# Patient Record
Sex: Female | Born: 1965 | Race: Black or African American | Hispanic: No | Marital: Single | Smoking: Current every day smoker
Health system: Southern US, Community
[De-identification: ages and names within clinical notes are randomized; demographics above are authoritative.]

## PROBLEM LIST (undated history)

## (undated) ENCOUNTER — Emergency Department (HOSPITAL_COMMUNITY): Admission: EM | Discharge status: Absent without leave | Payer: Medicare Other

## (undated) DIAGNOSIS — R9431 Abnormal electrocardiogram [ECG] [EKG]: Secondary | ICD-10-CM

## (undated) DIAGNOSIS — Z720 Tobacco use: Secondary | ICD-10-CM

## (undated) DIAGNOSIS — I214 Non-ST elevation (NSTEMI) myocardial infarction: Secondary | ICD-10-CM

## (undated) DIAGNOSIS — Z9981 Dependence on supplemental oxygen: Secondary | ICD-10-CM

## (undated) DIAGNOSIS — I2510 Atherosclerotic heart disease of native coronary artery without angina pectoris: Secondary | ICD-10-CM

## (undated) DIAGNOSIS — R0609 Other forms of dyspnea: Secondary | ICD-10-CM

## (undated) DIAGNOSIS — R0989 Other specified symptoms and signs involving the circulatory and respiratory systems: Secondary | ICD-10-CM

## (undated) DIAGNOSIS — J449 Chronic obstructive pulmonary disease, unspecified: Secondary | ICD-10-CM

## (undated) DIAGNOSIS — I639 Cerebral infarction, unspecified: Secondary | ICD-10-CM

## (undated) DIAGNOSIS — E785 Hyperlipidemia, unspecified: Secondary | ICD-10-CM

## (undated) DIAGNOSIS — I10 Essential (primary) hypertension: Secondary | ICD-10-CM

## (undated) DIAGNOSIS — N289 Disorder of kidney and ureter, unspecified: Secondary | ICD-10-CM

## (undated) HISTORY — DX: Abnormal electrocardiogram (ECG) (EKG): R94.31

## (undated) HISTORY — DX: Chronic obstructive pulmonary disease, unspecified: J44.9

## (undated) HISTORY — DX: Essential (primary) hypertension: I10

## (undated) HISTORY — DX: Cerebral infarction, unspecified: I63.9

## (undated) HISTORY — DX: Other specified symptoms and signs involving the circulatory and respiratory systems: R09.89

## (undated) HISTORY — DX: Non-ST elevation (NSTEMI) myocardial infarction: I21.4

## (undated) HISTORY — DX: Atherosclerotic heart disease of native coronary artery without angina pectoris: I25.10

## (undated) HISTORY — DX: Hyperlipidemia, unspecified: E78.5

## (undated) HISTORY — DX: Other forms of dyspnea: R06.09

## (undated) HISTORY — PX: CORONARY ANGIOPLASTY: SHX604

## (undated) HISTORY — DX: Tobacco use: Z72.0

---

## 2002-03-29 ENCOUNTER — Emergency Department (HOSPITAL_COMMUNITY): Admission: EM | Admit: 2002-03-29 | Discharge: 2002-03-29 | Payer: Self-pay | Admitting: Emergency Medicine

## 2002-07-24 ENCOUNTER — Ambulatory Visit (HOSPITAL_BASED_OUTPATIENT_CLINIC_OR_DEPARTMENT_OTHER): Admission: RE | Admit: 2002-07-24 | Discharge: 2002-07-24 | Payer: Self-pay | Admitting: Internal Medicine

## 2005-02-09 ENCOUNTER — Emergency Department (HOSPITAL_COMMUNITY): Admission: EM | Admit: 2005-02-09 | Discharge: 2005-02-09 | Payer: Self-pay | Admitting: Emergency Medicine

## 2005-02-25 ENCOUNTER — Emergency Department (HOSPITAL_COMMUNITY): Admission: EM | Admit: 2005-02-25 | Discharge: 2005-02-25 | Payer: Self-pay | Admitting: Family Medicine

## 2005-06-21 ENCOUNTER — Emergency Department (HOSPITAL_COMMUNITY): Admission: EM | Admit: 2005-06-21 | Discharge: 2005-06-21 | Payer: Self-pay | Admitting: Emergency Medicine

## 2005-07-01 ENCOUNTER — Emergency Department (HOSPITAL_COMMUNITY): Admission: EM | Admit: 2005-07-01 | Discharge: 2005-07-01 | Payer: Self-pay | Admitting: Family Medicine

## 2005-07-10 ENCOUNTER — Emergency Department (HOSPITAL_COMMUNITY): Admission: EM | Admit: 2005-07-10 | Discharge: 2005-07-10 | Payer: Self-pay | Admitting: Emergency Medicine

## 2005-07-29 ENCOUNTER — Emergency Department (HOSPITAL_COMMUNITY): Admission: EM | Admit: 2005-07-29 | Discharge: 2005-07-29 | Payer: Self-pay | Admitting: Emergency Medicine

## 2005-11-02 ENCOUNTER — Emergency Department (HOSPITAL_COMMUNITY): Admission: EM | Admit: 2005-11-02 | Discharge: 2005-11-02 | Payer: Self-pay | Admitting: Family Medicine

## 2005-11-23 ENCOUNTER — Emergency Department (HOSPITAL_COMMUNITY): Admission: EM | Admit: 2005-11-23 | Discharge: 2005-11-23 | Payer: Self-pay | Admitting: Family Medicine

## 2005-12-21 ENCOUNTER — Emergency Department (HOSPITAL_COMMUNITY): Admission: EM | Admit: 2005-12-21 | Discharge: 2005-12-21 | Payer: Self-pay | Admitting: Emergency Medicine

## 2006-03-13 ENCOUNTER — Emergency Department (HOSPITAL_COMMUNITY): Admission: EM | Admit: 2006-03-13 | Discharge: 2006-03-13 | Payer: Self-pay | Admitting: Emergency Medicine

## 2006-05-15 ENCOUNTER — Emergency Department (HOSPITAL_COMMUNITY): Admission: EM | Admit: 2006-05-15 | Discharge: 2006-05-15 | Payer: Self-pay | Admitting: Family Medicine

## 2006-06-24 ENCOUNTER — Ambulatory Visit (HOSPITAL_COMMUNITY): Admission: RE | Admit: 2006-06-24 | Discharge: 2006-06-24 | Payer: Self-pay | Admitting: Obstetrics

## 2006-09-30 ENCOUNTER — Emergency Department (HOSPITAL_COMMUNITY): Admission: EM | Admit: 2006-09-30 | Discharge: 2006-09-30 | Payer: Self-pay | Admitting: Family Medicine

## 2006-10-17 ENCOUNTER — Emergency Department (HOSPITAL_COMMUNITY): Admission: EM | Admit: 2006-10-17 | Discharge: 2006-10-17 | Payer: Self-pay | Admitting: Emergency Medicine

## 2006-12-27 ENCOUNTER — Emergency Department (HOSPITAL_COMMUNITY): Admission: EM | Admit: 2006-12-27 | Discharge: 2006-12-27 | Payer: Self-pay | Admitting: Emergency Medicine

## 2007-01-06 ENCOUNTER — Ambulatory Visit: Payer: Self-pay | Admitting: Pulmonary Disease

## 2007-01-16 ENCOUNTER — Ambulatory Visit (HOSPITAL_COMMUNITY): Admission: RE | Admit: 2007-01-16 | Discharge: 2007-01-16 | Payer: Self-pay | Admitting: Pulmonary Disease

## 2007-01-18 ENCOUNTER — Encounter: Payer: Self-pay | Admitting: Pulmonary Disease

## 2007-03-08 DIAGNOSIS — R0989 Other specified symptoms and signs involving the circulatory and respiratory systems: Secondary | ICD-10-CM

## 2007-03-08 DIAGNOSIS — I10 Essential (primary) hypertension: Secondary | ICD-10-CM

## 2007-03-08 DIAGNOSIS — R0609 Other forms of dyspnea: Secondary | ICD-10-CM

## 2007-03-08 HISTORY — DX: Other specified symptoms and signs involving the circulatory and respiratory systems: R06.09

## 2007-03-08 HISTORY — DX: Other specified symptoms and signs involving the circulatory and respiratory systems: R09.89

## 2008-02-22 ENCOUNTER — Emergency Department (HOSPITAL_COMMUNITY): Admission: EM | Admit: 2008-02-22 | Discharge: 2008-02-22 | Payer: Self-pay | Admitting: Emergency Medicine

## 2008-05-21 ENCOUNTER — Encounter: Payer: Self-pay | Admitting: Pulmonary Disease

## 2008-05-31 ENCOUNTER — Telehealth (INDEPENDENT_AMBULATORY_CARE_PROVIDER_SITE_OTHER): Payer: Self-pay | Admitting: *Deleted

## 2008-06-10 ENCOUNTER — Encounter: Payer: Self-pay | Admitting: Pulmonary Disease

## 2009-07-07 ENCOUNTER — Emergency Department (HOSPITAL_COMMUNITY)
Admission: EM | Admit: 2009-07-07 | Discharge: 2009-07-07 | Payer: Self-pay | Source: Home / Self Care | Admitting: Emergency Medicine

## 2009-11-15 ENCOUNTER — Emergency Department (HOSPITAL_COMMUNITY): Admission: EM | Admit: 2009-11-15 | Discharge: 2009-11-15 | Payer: Self-pay | Admitting: Emergency Medicine

## 2009-12-30 ENCOUNTER — Encounter: Payer: Self-pay | Admitting: Cardiovascular Disease

## 2010-01-28 ENCOUNTER — Encounter: Payer: Self-pay | Admitting: Cardiovascular Disease

## 2010-02-05 ENCOUNTER — Ambulatory Visit (HOSPITAL_COMMUNITY)
Admission: RE | Admit: 2010-02-05 | Discharge: 2010-02-05 | Payer: Self-pay | Source: Home / Self Care | Attending: Orthopaedic Surgery | Admitting: Orthopaedic Surgery

## 2010-02-05 ENCOUNTER — Encounter: Payer: Self-pay | Admitting: Cardiovascular Disease

## 2010-02-12 ENCOUNTER — Encounter: Payer: Self-pay | Admitting: Cardiovascular Disease

## 2010-02-12 DIAGNOSIS — J4489 Other specified chronic obstructive pulmonary disease: Secondary | ICD-10-CM | POA: Insufficient documentation

## 2010-02-12 DIAGNOSIS — Q359 Cleft palate, unspecified: Secondary | ICD-10-CM | POA: Insufficient documentation

## 2010-02-12 DIAGNOSIS — J45909 Unspecified asthma, uncomplicated: Secondary | ICD-10-CM | POA: Insufficient documentation

## 2010-02-12 DIAGNOSIS — J449 Chronic obstructive pulmonary disease, unspecified: Secondary | ICD-10-CM | POA: Insufficient documentation

## 2010-02-12 DIAGNOSIS — J40 Bronchitis, not specified as acute or chronic: Secondary | ICD-10-CM | POA: Insufficient documentation

## 2010-02-13 ENCOUNTER — Ambulatory Visit
Admission: RE | Admit: 2010-02-13 | Discharge: 2010-02-13 | Payer: Self-pay | Source: Home / Self Care | Attending: Cardiovascular Disease | Admitting: Cardiovascular Disease

## 2010-02-13 DIAGNOSIS — R9431 Abnormal electrocardiogram [ECG] [EKG]: Secondary | ICD-10-CM | POA: Insufficient documentation

## 2010-02-13 HISTORY — DX: Abnormal electrocardiogram (ECG) (EKG): R94.31

## 2010-02-24 ENCOUNTER — Ambulatory Visit: Admit: 2010-02-24 | Payer: Self-pay

## 2010-03-01 ENCOUNTER — Encounter: Payer: Self-pay | Admitting: Obstetrics

## 2010-03-12 NOTE — Letter (Signed)
Summary: [facility] - Cardiac Clearance  [facility] - Cardiac Clearance   Imported By: Marilynne Drivers 02/13/2010 08:51:13  _____________________________________________________________________  External Attachment:    Type:   Image     Comment:   External Document

## 2010-03-12 NOTE — Letter (Signed)
Summary: Warehouse manager - Office Note  [facility] - Office Note   Imported By: Marilynne Drivers 02/13/2010 08:52:49  _____________________________________________________________________  External Attachment:    Type:   Image     Comment:   External Document

## 2010-03-12 NOTE — Assessment & Plan Note (Signed)
Summary: abnormal ekg/pt needs left ulner nerve decomperssion of the e...   History of Present Illness: Karla Nelson is seen today at the request of Dr Rush Farmer for preop clearance.  She needs left elbow ulnar nerve decompression.  Preop evaluation showed abnormal ECG.  I reviewed this and she had marked anterolateral T wave changes.  No old ECG available as she moved from [place].  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  Not exertional sometimes focal to left neck and head.  Smokes a ppd with some exertional dyspnea.  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  Has not had previous echo or ETT.  CRF also include HTN and has chronic dyspnea from COPD.    Given CRF's atypical SSCP, dyspnea and smoking with abnormal ECG she will need a stress myovue and echo to clear her for surgery.  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 Inhale 2 Puffs Every 4 Hours As Needed 2)  Symbicort 160-4.5 Mcg/act  Aero (Budesonide-Formoterol Fumarate) .... Inhale 2 Puffs Two Times A Day 3)  Methadone .... (Unknown Dosage) 4)  Lisinopril 40 Mg Tabs (Lisinopril) .... Take One Tablet By Mouth Daily 5)  Ibuprofen 800 Mg Tabs (Ibuprofen) .... As Needed 6)  Metoprolol Tartrate 50 Mg Tabs (Metoprolol Tartrate) .... Take One Tablet By Mouth Twice A Day 7)  Singulair 10 Mg Tabs (Montelukast Sodium) .Marland Kitchen.. 1 Tab By Mouth Once Daily 8)  Oxygen .... Daily 9)  Nebulizer Machine .... As Directed 10)  Nasal  Spray .... As Needed  Allergies (verified): No Known Drug Allergies  Past History:  Past Medical History: Last updated: 02/12/2010 HYPERTENSION CLEFT PALATE  ASTHMA: sensitive to BB;s when used to Rx HTN COPD  BRONCHITIS DYSPNEA ON EXERTION   Past Surgical History: Last updated: 02/12/2010  caesarean section   Family History: Last updated: 02/12/2010 diabetes, hypertension  Social History: Last updated: 02/12/2010  current smoker w/i last 12 mos., former drug abuse   Review of Systems       Denies fever, malais, weight loss, blurry vision, decreased visual acuity, cough, sputum, hemoptysis, pleuritic pain, palpitaitons, heartburn, abdominal pain, melena, lower extremity edema, claudication, or rash.   Vital Signs:  Patient profile:   45 year old female Height:      65 inches Weight:      177 pounds BMI:     29.56 Pulse rate:   67 / minute Resp:     14 per minute BP sitting:   171 / 108  (left arm)  Vitals Entered By: Burnett Kanaris (February 13, 2010 11:10 AM)  Physical Exam  General:  Affect appropriate Healthy:  appears stated age 34: normal Neck supple with no adenopathy JVP normal no bruits no thyromegaly Lungs clear with no wheezing and good diaphragmatic motion Heart:  S1/S2 no murmur,rub, gallop or click PMI normal Abdomen: benighn, BS positve, no tenderness, no AAA no bruit.  No HSM or HJR Distal pulses intact with no bruits No edema Neuro non-focal Skin warm and dry    Impression & Recommendations:  Problem # 1:  HYPERTENSION (ICD-401.9) Continue current meds.  Monitor BB for wheezing with history of asthma Her updated medication list for this problem includes:    Lisinopril 40 Mg Tabs (Lisinopril) .Marland Kitchen... Take  one tablet by mouth daily    Metoprolol Tartrate 50 Mg Tabs (Metoprolol tartrate) .Marland Kitchen... Take one tablet by mouth twice a day  Problem # 2:  ELECTROCARDIOGRAM, ABNORMAL (ICD-794.31) Echo to R/O HOCM  stress myovue to R/O ischemia Her updated medication list for this problem includes:    Lisinopril 40 Mg Tabs (Lisinopril) .Marland Kitchen... Take one tablet by mouth daily    Metoprolol Tartrate 50 Mg Tabs (Metoprolol tartrate) .Marland Kitchen... Take one tablet by mouth twice a day  Problem # 3:  PREOPERATIVE EXAMINATION (ICD-V72.84) Will clear for  surgery pending myovue and echo  COPD not prohibitive.  Counseled her for less than 10 minuts on cessation.  With methadone Rx best approach would be nicotine patch fi tests are normal.    Other Orders: Echocardiogram (Echo) Nuclear Stress Test (Nuc Stress Test)  Patient Instructions: 1)  Your physician has requested that you have an echocardiogram.  Echocardiography is a painless test that uses sound waves to create images of your heart. It provides your doctor with information about the size and shape of your heart and how well your heart's chambers and valves are working.  This procedure takes approximately one hour. There are no restrictions for this procedure. 2)  Your physician has requested that you have an exercise stress myoview.  For further information please visit [email or web address].  Please follow instruction sheet, as given.   Echocardiogram Report  Procedure date:  02/05/2010  Findings:      NSR 66 Diffuse lateral T wave inversions

## 2010-03-12 NOTE — Letter (Signed)
Summary: Warehouse manager - Office Note  [facility] - Office Note   Imported By: Marilynne Drivers 02/13/2010 08:52:09  _____________________________________________________________________  External Attachment:    Type:   Image     Comment:   External Document

## 2010-04-14 ENCOUNTER — Telehealth: Payer: Self-pay | Admitting: Cardiovascular Disease

## 2010-04-20 LAB — PREGNANCY, URINE: Preg Test, Ur: NEGATIVE

## 2010-04-20 LAB — BASIC METABOLIC PANEL
Calcium: 9.5 mg/dL (ref 8.4–10.5)
Chloride: 104 mEq/L (ref 96–112)
Creatinine, Ser: 1 mg/dL (ref 0.4–1.2)
GFR calc Af Amer: >60 mL/min (ref >60)
GFR calc non Af Amer: 60 mL/min — ABNORMAL LOW (ref >60)

## 2010-04-20 LAB — PROTIME-INR
INR: 1.01 (ref 0.00–1.49)
Prothrombin Time: 13.5 seconds (ref 11.6–15.2)

## 2010-04-28 NOTE — Progress Notes (Addendum)
Summary: sur clearance for elbow surgery** Second Request*  Phone Note From Other Clinic   Caller: Nurse sherri dr. Louanne Skye office # [phone] Summary of Call: pt needs sur. clearance for left elbow surgery.  Initial call taken by: Regan Lemming,  April 14, 2010 3:52 PM  Follow-up for Phone Call        Pt. needs surgical clearance for left elbow surgery. Will forward to Dr. Johnsie Cancel to review. Whitney Jannett Celestine RN  April 14, 2010 4:21 PM  Attempted to call pt. and LVM to schedule echo/stress myoview for surgical clearance but VM was full. Patient was scheduled for these procedures in Jan, 2012 but they were cancelled. Will attempt to call her back later. Whitney Jannett Celestine RN  April 16, 2010 8:23 AM  Follow-up by: Whitney Jannett Celestine RN,  April 14, 2010 4:21 PM  Additional Follow-up for Phone Call Additional follow up Details #1::        She was supposed to have echo and myovue to clear her for surgery.  Dont see that these have been done  Please read my last note before sending these phone notes.   Additional Follow-up by: Bosie Clos, MD, [facility],  April 15, 2010 8:48 AM

## 2010-05-25 LAB — POCT I-STAT, CHEM 8
Calcium, Ion: 1.01 mmol/L — ABNORMAL LOW (ref 1.12–1.32)
Glucose, Bld: 139 mg/dL — ABNORMAL HIGH (ref 70–99)
HCT: 49 % — ABNORMAL HIGH (ref 36.0–46.0)
Hemoglobin: 16.7 g/dL — ABNORMAL HIGH (ref 12.0–15.0)
Potassium: 3 mEq/L — ABNORMAL LOW (ref 3.5–5.1)
TCO2: 34 mmol/L (ref 0–100)

## 2010-05-25 LAB — D-DIMER, QUANTITATIVE: D-Dimer, Quant: 0.33 ug{FEU}/mL (ref 0.00–0.48)

## 2010-09-09 HISTORY — PX: CARDIAC CATHETERIZATION: SHX172

## 2010-09-25 ENCOUNTER — Inpatient Hospital Stay (HOSPITAL_COMMUNITY)
Admission: EM | Admit: 2010-09-25 | Discharge: 2010-09-27 | DRG: 281 | Disposition: A | Discharge status: Home / Self Care | Payer: Medicare Other | Attending: Cardiovascular Disease | Admitting: Cardiovascular Disease

## 2010-09-25 ENCOUNTER — Emergency Department (HOSPITAL_COMMUNITY): Payer: Medicare Other

## 2010-09-25 DIAGNOSIS — Z7982 Long term (current) use of aspirin: Secondary | ICD-10-CM

## 2010-09-25 DIAGNOSIS — J449 Chronic obstructive pulmonary disease, unspecified: Secondary | ICD-10-CM | POA: Diagnosis present

## 2010-09-25 DIAGNOSIS — I214 Non-ST elevation (NSTEMI) myocardial infarction: Secondary | ICD-10-CM

## 2010-09-25 DIAGNOSIS — I739 Peripheral vascular disease, unspecified: Secondary | ICD-10-CM

## 2010-09-25 DIAGNOSIS — E785 Hyperlipidemia, unspecified: Secondary | ICD-10-CM | POA: Diagnosis present

## 2010-09-25 DIAGNOSIS — I2510 Atherosclerotic heart disease of native coronary artery without angina pectoris: Secondary | ICD-10-CM | POA: Diagnosis present

## 2010-09-25 DIAGNOSIS — J4489 Other specified chronic obstructive pulmonary disease: Secondary | ICD-10-CM | POA: Diagnosis present

## 2010-09-25 DIAGNOSIS — F17200 Nicotine dependence, unspecified, uncomplicated: Secondary | ICD-10-CM | POA: Diagnosis present

## 2010-09-25 DIAGNOSIS — I10 Essential (primary) hypertension: Secondary | ICD-10-CM | POA: Diagnosis present

## 2010-09-25 DIAGNOSIS — Z8249 Family history of ischemic heart disease and other diseases of the circulatory system: Secondary | ICD-10-CM

## 2010-09-25 DIAGNOSIS — Z79899 Other long term (current) drug therapy: Secondary | ICD-10-CM

## 2010-09-25 HISTORY — DX: Non-ST elevation (NSTEMI) myocardial infarction: I21.4

## 2010-09-25 LAB — URINALYSIS, ROUTINE W REFLEX MICROSCOPIC
Bilirubin Urine: NEGATIVE
Ketones, ur: NEGATIVE mg/dL
Leukocytes, UA: NEGATIVE
Nitrite: NEGATIVE
Specific Gravity, Urine: 1.012 (ref 1.005–1.030)
Urobilinogen, UA: 0.2 mg/dL (ref 0.0–1.0)

## 2010-09-25 LAB — RAPID URINE DRUG SCREEN, HOSP PERFORMED
Amphetamines: NOT DETECTED
Benzodiazepines: NOT DETECTED
Cocaine: NOT DETECTED
Opiates: NOT DETECTED

## 2010-09-25 LAB — DIFFERENTIAL
Basophils Absolute: 0.1 10*3/uL (ref 0.0–0.1)
Basophils Relative: 0 % (ref 0–1)
Eosinophils Absolute: 0.2 10*3/uL (ref 0.0–0.7)
Eosinophils Relative: 1 % (ref 0–5)

## 2010-09-25 LAB — CBC
MCHC: 35.6 g/dL (ref 30.0–36.0)
Platelets: 283 10*3/uL (ref 150–400)
RDW: 13.3 % (ref 11.5–15.5)
WBC: 14.8 10*3/uL — ABNORMAL HIGH (ref 4.0–10.5)

## 2010-09-25 LAB — BASIC METABOLIC PANEL
BUN: 14 mg/dL (ref 6–23)
Chloride: 98 mEq/L (ref 96–112)
Glucose, Bld: 159 mg/dL — ABNORMAL HIGH (ref 70–99)
Potassium: 3.6 mEq/L (ref 3.5–5.1)

## 2010-09-25 LAB — POCT I-STAT TROPONIN I

## 2010-09-25 LAB — LIPID PANEL
Cholesterol: 138 mg/dL (ref 0–200)
HDL: 36 mg/dL — ABNORMAL LOW (ref >39)
Triglycerides: 79 mg/dL (ref <150)

## 2010-09-25 LAB — HEMOGLOBIN A1C: Hgb A1c MFr Bld: 6.8 % — ABNORMAL HIGH (ref <5.7)

## 2010-09-25 LAB — TROPONIN I: Troponin I: 3.41 ng/mL (ref <0.30)

## 2010-09-25 LAB — APTT: aPTT: 30 seconds (ref 24–37)

## 2010-09-26 DIAGNOSIS — I10 Essential (primary) hypertension: Secondary | ICD-10-CM

## 2010-09-26 LAB — COMPREHENSIVE METABOLIC PANEL
AST: 30 U/L (ref 0–37)
Albumin: 3.1 g/dL — ABNORMAL LOW (ref 3.5–5.2)
Calcium: 8.9 mg/dL (ref 8.4–10.5)
Chloride: 103 mEq/L (ref 96–112)
Creatinine, Ser: 0.97 mg/dL (ref 0.50–1.10)
Total Bilirubin: 0.2 mg/dL — ABNORMAL LOW (ref 0.3–1.2)

## 2010-09-26 LAB — CARDIAC PANEL(CRET KIN+CKTOT+MB+TROPI)
CK, MB: 10.4 ng/mL (ref 0.3–4.0)
CK, MB: 6.7 ng/mL (ref 0.3–4.0)
Relative Index: 7.4 — ABNORMAL HIGH (ref 0.0–2.5)
Relative Index: 6.3 — ABNORMAL HIGH (ref 0.0–2.5)
Total CK: 123 U/L (ref 7–177)
Troponin I: 3.22 ng/mL (ref <0.30)
Troponin I: 3.02 ng/mL (ref <0.30)

## 2010-09-26 LAB — CBC
HCT: 43.2 % (ref 36.0–46.0)
MCV: 87.3 fL (ref 78.0–100.0)
Platelets: 284 10*3/uL (ref 150–400)
RBC: 4.95 MIL/uL (ref 3.87–5.11)
WBC: 14 10*3/uL — ABNORMAL HIGH (ref 4.0–10.5)

## 2010-09-27 DIAGNOSIS — I517 Cardiomegaly: Secondary | ICD-10-CM

## 2010-10-06 NOTE — Cardiovascular Report (Signed)
NAMEANELY, CHARLESTON            ACCOUNT NO.:  [id number]  MEDICAL RECORD NO.:  [id number]  LOCATION:  48                         FACILITY:  [place]  PHYSICIAN:  Shaune Pascal. Bensimhon, MDDATE OF BIRTH:  [birth year]  DATE OF PROCEDURE:  09/25/2010 DATE OF DISCHARGE:                           CARDIAC CATHETERIZATION   PATIENT IDENTIFICATION:  Mr. Karla Nelson is a 45 year old woman with a history of severe hypertension and previous polysubstance abuse.  She apparently quit heroin 7 years ago.  She also has morbid obesity.  She was admitted today with acute onset of chest pain, it is radiating to left arm in the setting of severe hypertension.  When she came to the ER, blood pressure was 241/137.  Point-of-care markers showed positive troponin with 1.6 and an elevated BNP at 16 87.  She was started on IV nitroglycerin and brought to the cardiac cath lab.  She was quite hypertensive on arriving.  PROCEDURES PERFORMED: 1. Selective coronary angiography. 2. Left heart cath. 3. Left ventriculogram. 4. Abdominal aortogram. 5. StarClose femoral artery closure (failed).  DESCRIPTION OF PROCEDURE:  The risks and indication were explained, consent was signed and placed on the chart.  Right groin area was prepped and draped in routine sterile fashion, anesthetized with 1% local lidocaine.  A 5-French arterial sheath was placed in the right femoral artery using modified Seldinger technique.  Standard catheters including JL-4, JR-4, angled pigtail were used.  All catheter exchanges were made over wire.  There were no apparent complications.  At the end of the procedure, we attempted to close her right femoral artery site with StarClose device, but we were unable to get adequate hemostasis. Manual pressure was then applied.  Central aortic pressure 176/98 with mean of 130.  LV pressure 171/11 with EDP of 20.  There was no aortic stenosis.  Left main was normal.  LAD was a large vessel,  coursing to the apex, gave off 2 diagonal branches.  There is a 50-60% lesion in the proximal LAD, then 20-30% lesion in the midsection.  At the ostium of the first diagonal, there was a 50% lesion.  Left circumflex is a large vessel, gave off a tiny ramus, 2 marginal branches and 2 posterolaterals.  In the distal left circumflex, there was a 60-70% lesion between the posterolaterals.  Right coronary artery was a dominant vessel, had a large RV branch, small PDA and several posterolaterals.  There is a 20% lesion in the midsection and a 50% lesion in the proximal portion of the RV branch.  Left ventriculogram done in the RAO position showed hyperdynamic LV with EF of 70-75%.  Abdominal aortogram showed normal abdominal aorta.  Renal arteries were widely patent bilaterally.  ASSESSMENT: 1. Nonobstructive coronary artery disease. 2. Hyperdynamic left ventricular function. 3. Malignant hypertension. 4. Normal renal arteries.  PLAN/DISCUSSION:  I have reviewed the films with Dr. Burt Knack.  Her coronary artery disease is nonobstructive.  I think her major issue is malignant hypertension.  We will plan medical therapy with aggressive blood pressure control.  She will also need a statin therapy.     Shaune Pascal. Bensimhon, MD     DRB/MEDQ  D:  09/25/2010  T:  09/26/2010  Job:  [id number]  Electronically Signed by Glori Bickers MD on 10/06/2010 05:28:11 PM

## 2010-10-09 ENCOUNTER — Encounter: Payer: Self-pay | Admitting: Cardiovascular Disease

## 2010-10-09 ENCOUNTER — Encounter: Payer: Self-pay | Admitting: *Deleted

## 2010-10-13 ENCOUNTER — Encounter: Payer: Self-pay | Admitting: Cardiovascular Disease

## 2010-10-13 ENCOUNTER — Encounter: Payer: Self-pay | Admitting: *Deleted

## 2010-10-13 ENCOUNTER — Ambulatory Visit (INDEPENDENT_AMBULATORY_CARE_PROVIDER_SITE_OTHER): Payer: Medicare Other | Admitting: Cardiovascular Disease

## 2010-10-13 DIAGNOSIS — I2510 Atherosclerotic heart disease of native coronary artery without angina pectoris: Secondary | ICD-10-CM

## 2010-10-13 DIAGNOSIS — R9431 Abnormal electrocardiogram [ECG] [EKG]: Secondary | ICD-10-CM

## 2010-10-13 DIAGNOSIS — J45909 Unspecified asthma, uncomplicated: Secondary | ICD-10-CM

## 2010-10-13 DIAGNOSIS — F17200 Nicotine dependence, unspecified, uncomplicated: Secondary | ICD-10-CM

## 2010-10-13 DIAGNOSIS — E782 Mixed hyperlipidemia: Secondary | ICD-10-CM | POA: Insufficient documentation

## 2010-10-13 DIAGNOSIS — J4489 Other specified chronic obstructive pulmonary disease: Secondary | ICD-10-CM

## 2010-10-13 DIAGNOSIS — J449 Chronic obstructive pulmonary disease, unspecified: Secondary | ICD-10-CM

## 2010-10-13 DIAGNOSIS — I10 Essential (primary) hypertension: Secondary | ICD-10-CM

## 2010-10-13 MED ORDER — LISINOPRIL 40 MG PO TABS: 40.0000 mg | ORAL_TABLET | Freq: Every day | ORAL | Status: DC

## 2010-10-13 MED ORDER — BUPROPION HCL ER (XL) 150 MG PO TB24: 150.0000 mg | ORAL_TABLET | Freq: Every day | ORAL | Status: DC

## 2010-10-13 NOTE — Progress Notes (Signed)
45 yo I have not seen before.  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  Seen by Dr Jeffie Pollock and cath showed 'nonobstructive" disease.  Had 50% ostial D1 and 70% distal circumflex PLA disease.  Normal EF.  Some evidence of diastolic dysfunction with elevated EDP and BNP.  Uncontrolled BP with no RAS on cath.  Since D/C indicates compliance with meds.  However she was supposed to be D/C on lisinopril 40mg  and not taking.  Long discussion about long term complications of unRx BP.  Low sodium diet  She is still smoking.  Has had oxygen at home the last few years "as needed"  Uses inhalers.  Counseled for less than 10 minutes .Reasonable candidate for welbutrin and pulmonary referrel.  Mild cough with no ferver chills or sputum.    ROS: Denies fever, malais, weight loss, blurry vision, decreased visual acuity, cough, sputum, SOB, hemoptysis, pleuritic pain, palpitaitons, heartburn, abdominal pain, melena, lower extremity edema, claudication, or rash.  All other systems reviewed and negative   General: Affect appropriate Healthy:  appears stated age 71: normal Neck supple with no adenopathy JVP normal no bruits no thyromegaly Lungs rhonchi and wheezing and good diaphragmatic motion Heart:  S1/S2 no murmur,rub, gallop or click PMI normal Abdomen: benighn, BS positve, no tenderness, no AAA no bruit.  No HSM or HJR Distal pulses intact with no bruits No edema Neuro non-focal Skin warm and dry No muscular weakness  Medications Current Outpatient Prescriptions  Medication Sig Dispense Refill  . albuterol (PROAIR HFA) 108 (90 BASE) MCG/ACT inhaler Inhale 2 puffs into the lungs every 6 (six) hours as needed.        Marland Kitchen aspirin 81 MG tablet Take 81 mg by mouth daily.        . budesonide-formoterol (SYMBICORT) 160-4.5 MCG/ACT inhaler Inhale 2 puffs into the lungs 2 (two) times daily.        . hydrALAZINE (APRESOLINE) 50 MG tablet Take 50 mg by mouth 3 (three) times daily.        Marland Kitchen  labetalol (NORMODYNE) 300 MG tablet Take 300 mg by mouth 3 (three) times daily.        Marland Kitchen METHADONE HCL PO LIQIUD 37       . Multiple Vitamin (MULTIVITAMIN) capsule Take 1 capsule by mouth daily.        . simvastatin (ZOCOR) 40 MG tablet Take 40 mg by mouth at bedtime.          Allergies Review of patient's allergies indicates no known allergies.  Family History: Family History  Problem Relation Age of Onset  . Diabetes    . Hypertension      Social History: History   Social History  . Marital Status: Legally Separated    Spouse Name: N/A    Number of Children: N/A  . Years of Education: N/A   Occupational History  . Not on file.   Social History Main Topics  . Smoking status: Current Everyday Smoker  . Smokeless tobacco: Not on file  . Alcohol Use: Not on file  . Drug Use: Not on file     former drug use  . Sexually Active: Not on file   Other Topics Concern  . Not on file   Social History Narrative  . No narrative on file    Electrocardiogram:  02/05/10  NSR anterolateral T wave changes  Assessment and Plan

## 2010-10-13 NOTE — Patient Instructions (Signed)
Your physician recommends that you schedule a follow-up appointment in: 8-10 WEEKS  REFERRAL TO PULMONARY FOR COPD  START WELLBUTRIN XL 150MG  ONCE DAILY  START LISINOPRIL 40 MG ONCE DAILY

## 2010-10-13 NOTE — Assessment & Plan Note (Signed)
Add back lisinopril and F/U in 6-8 weeks  May need more beta blocker

## 2010-10-13 NOTE — Assessment & Plan Note (Signed)
Refer to pulmonary  Continue inhalers.  Not sure she needs home oxygen.  They will likely due baseline PFT;s and 6 minute walk test

## 2010-10-13 NOTE — Assessment & Plan Note (Signed)
Cholesterol is at goal.  Continue current dose of statin and diet Rx.  No myalgias or side effects.  F/U  LFT's in 6 months. Lab Results  Component Value Date   LDLCALC 86 09/25/2010

## 2010-10-13 NOTE — Assessment & Plan Note (Signed)
Likely related to LVH.  No critical CAD Yearly ECG

## 2010-10-14 NOTE — H&P (Signed)
[name], [name]            ACCOUNT NO.:  [id number]  MEDICAL RECORD NO.:  [id number]  LOCATION:  [id number]                         FACILITY:  [facility]  PHYSICIAN:  Juanda Bond. Burt Knack, MD  DATE OF BIRTH:  10-14-1965  DATE OF ADMISSION:  09/25/2010 DATE OF DISCHARGE:                             HISTORY & PHYSICAL   PRIMARY CARE PHYSICIAN:  Jana Hakim, MD  PRIMARY CARDIOLOGIST:  Wallis Bamberg. Johnsie Cancel, MD, [facility]  CHIEF COMPLAINT:  Non-ST-segment elevation MI.  HISTORY OF PRESENT ILLNESS:  Karla Nelson is a 45 year old female with no previous history of coronary artery disease.  She developed sharp left-sided chest pain as well as outer left upper arm pain approximately 2:00 p.m. yesterday.  Her symptoms continued at between an 8 and 10/10. She also had throbbing in her left triceps area.  She feels that walking made it worse.  Holding her left arm bent and slightly raised made her left arm pain better.  She did not get much rest because every time she fell asleep, the pain would wake her.  She does not feel that her symptoms changed with deep inspiration.  There were no associated symptoms.  She has never had this before.  She has chronic dyspnea on exertion secondary to a history of tobacco use and some lung disease but this has not changed recently.  She has had no palpitations and no edema.  Today when her symptoms had not resolved, she went to the emergency room.  Her EKG was abnormal and cardiac enzymes were elevated, so Cardiology was asked to evaluate her.  She is currently pain-free on IV nitroglycerin, IV heparin, and also received aspirin 81 mg x4, sublingual nitroglycerin, and nitroglycerin paste.  PAST MEDICAL HISTORY: 1. History of a preop evaluation in January 2012 by Dr. Jenkins Rouge     for dyspnea on exertion and abnormal EKG.  Echocardiogram and     Myoview were recommended but the patient cancelled. 2. Hypertension. 3. Unknown lipid status. 4. Ongoing tobacco  use. 5. Family history of coronary artery disease. 6. History of cleft palate. 7. History of asthma and bronchitis as well as COPD.  PAST SURGICAL HISTORY:  She has had a C-section.  ALLERGIES:  She had problems with beta-blockers because of her asthma per an old note, but is tolerating them well at this time.  CURRENT MEDICATIONS: 1. Hydralazine 25 mg daily. 2. Methadone 10 mg/mL, 3.7 mL daily. 3. Multivitamin daily. 4. Symbicort b.i.d. 5. ProAir p.r.n. 6. Lasix 40 mg p.r.n. 7. Fish oil 1000 mg daily. 8. B complex daily. 9. Lopressor 50 mg daily. 10.Lisinopril 40 mg daily.  The patient admits that she was only taking medications once a day and did not realize that some of them were 2 or 3 times a day or more.  SOCIAL HISTORY:  She lives in [place].  At times, her nephew and her son will stay with her.  She works at [employer] as a Scientist, water quality.  She has a greater than 30-pack-year history of ongoing tobacco use and denies alcohol abuse.  She has a history of drug use but has been clean for over 5 years and on methadone.  FAMILY HISTORY:  Her mother  is alive at 87 with a history of coronary artery disease and her father is alive at 48 with no heart disease.  No siblings have cardiac issues.  REVIEW OF SYSTEMS:  She has coughing and wheezing.  The cough is nonproductive.  She has not had any recent illnesses, fevers, or chills. She denies reflux symptoms or melena.  She has occasional arthralgias and joint pains.  She is having no urinary symptoms.  Full 14-point review of systems is otherwise negative except as stated in the HPI.  PHYSICAL EXAMINATION:  VITAL SIGNS:  Temperature is 98.7, initial blood pressure 241/137 now 147/92, heart rate 72, respiratory rate 16, O2 saturation 95% on room air. GENERAL:  She is a well-developed, well-nourished African American female in no acute distress. HEENT:  Normal. NECK:  There is no lymphadenopathy, thyromegaly, bruit, or JVD  noted. CV:  Heart is regular rate and rhythm with an S1-S2 and no significant murmur, rub, or gallop is noted.  Distal pulses are intact in all 4 extremities. LUNGS:  She has a few rales and some wheezing but generally good air exchange. SKIN:  No rashes or lesions are noted. ABDOMEN:  Soft and has active bowel sounds.  She has some tenderness in the mid upper quadrant. EXTREMITIES:  There is no cyanosis, clubbing, or edema noted. MUSCULOSKELETAL:  There is no joint deformity or effusions and no spine or CVA tenderness. NEURO:  She is alert and oriented.  Cranial nerves II-XII grossly intact.  IMAGING:  Chest x-ray:  She has an old left rib fracture but no acute disease.  EKG:  Sinus rhythm, rate 70 with anterolateral T-wave changes also seen on an EKG dated December 2011.  LABORATORY VALUES:  Hemoglobin 15.4, hematocrit 43.2, WBC 14.8, and platelets 283.  INR 0.9 and PTT 30.  Sodium 136, potassium 3.6, chloride 98, CO2 30, BUN 14, creatinine 1.08, and glucose 159.  Point-of-care troponin 1.7 with a BNP of 1687.  Urinalysis negative except for urine glucose of 100, protein 30, and rare bacteria. IMPRESSION:  [name] was seen today by Dr. Burt Knack, the patient evaluated and the data reviewed.  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  She had chest and left arm pain throughout left side in the setting of severe hypertension.  Point-of- care troponin was positive and diagnostic of acute coronary syndrome. Her chest pain has now resolved.  Her blood pressure is controlled on nitroglycerin.  We recommend catheterization and possible percutaneous intervention.  The risks and benefits of the procedure were discussed with the patient.  She needed some time to think about it but has agreed to proceed.  We will check a 2-D echocardiogram as well.  The two most likely etiologies for her acute coronary syndrome are obstructive coronary artery disease  versus malignant hypertension. She has multiple cardiovascular risk factors including tobacco, hypertension, and family history.  She has hyperglycemia, so we will screen for diabetes and check a lipid profile as well.     Rosaria Ferries, PA-C   ______________________________ Juanda Bond. Burt Knack, MD    RB/MEDQ  D:  09/25/2010  T:  09/25/2010  Job:  [id number]  Electronically Signed by Rosaria Ferries PA-C on 10/07/2010 06:44:51 AM Electronically Signed by Sherren Mocha MD on 10/14/2010 11:35:55 PM

## 2010-10-19 ENCOUNTER — Encounter (HOSPITAL_COMMUNITY): Payer: Medicare Other

## 2010-10-21 ENCOUNTER — Encounter (HOSPITAL_COMMUNITY): Payer: Medicare Other

## 2010-10-23 ENCOUNTER — Encounter (HOSPITAL_COMMUNITY): Payer: Medicare Other

## 2010-10-26 ENCOUNTER — Encounter (HOSPITAL_COMMUNITY)
Admission: RE | Admit: 2010-10-26 | Discharge: 2010-10-26 | Disposition: A | Discharge status: Home / Self Care | Payer: Medicare Other | Source: Ambulatory Visit | Attending: Cardiovascular Disease | Admitting: Cardiovascular Disease

## 2010-10-26 DIAGNOSIS — E785 Hyperlipidemia, unspecified: Secondary | ICD-10-CM | POA: Insufficient documentation

## 2010-10-26 DIAGNOSIS — Z5189 Encounter for other specified aftercare: Secondary | ICD-10-CM | POA: Insufficient documentation

## 2010-10-26 DIAGNOSIS — J449 Chronic obstructive pulmonary disease, unspecified: Secondary | ICD-10-CM | POA: Insufficient documentation

## 2010-10-26 DIAGNOSIS — I214 Non-ST elevation (NSTEMI) myocardial infarction: Secondary | ICD-10-CM | POA: Insufficient documentation

## 2010-10-26 DIAGNOSIS — Z79899 Other long term (current) drug therapy: Secondary | ICD-10-CM | POA: Insufficient documentation

## 2010-10-26 DIAGNOSIS — J4489 Other specified chronic obstructive pulmonary disease: Secondary | ICD-10-CM | POA: Insufficient documentation

## 2010-10-26 DIAGNOSIS — I10 Essential (primary) hypertension: Secondary | ICD-10-CM | POA: Insufficient documentation

## 2010-10-26 DIAGNOSIS — I2510 Atherosclerotic heart disease of native coronary artery without angina pectoris: Secondary | ICD-10-CM | POA: Insufficient documentation

## 2010-10-26 DIAGNOSIS — Z8249 Family history of ischemic heart disease and other diseases of the circulatory system: Secondary | ICD-10-CM | POA: Insufficient documentation

## 2010-10-26 DIAGNOSIS — F17200 Nicotine dependence, unspecified, uncomplicated: Secondary | ICD-10-CM | POA: Insufficient documentation

## 2010-10-26 DIAGNOSIS — Z7982 Long term (current) use of aspirin: Secondary | ICD-10-CM | POA: Insufficient documentation

## 2010-10-27 ENCOUNTER — Institutional Professional Consult (permissible substitution): Payer: Medicare Other | Admitting: Pulmonary Disease

## 2010-10-28 ENCOUNTER — Encounter (HOSPITAL_COMMUNITY): Payer: Medicare Other

## 2010-10-30 ENCOUNTER — Encounter (HOSPITAL_COMMUNITY): Payer: Medicare Other

## 2010-11-02 ENCOUNTER — Encounter (HOSPITAL_COMMUNITY): Payer: Medicare Other

## 2010-11-03 ENCOUNTER — Telehealth: Payer: Self-pay | Admitting: Cardiovascular Disease

## 2010-11-03 ENCOUNTER — Encounter: Payer: Self-pay | Admitting: *Deleted

## 2010-11-03 NOTE — Telephone Encounter (Signed)
Needs to speak to you regarding a clearance for her to go back to the methadone clinic.

## 2010-11-03 NOTE — Telephone Encounter (Signed)
Letter faxed to [phone] Karla Nelson

## 2010-11-04 ENCOUNTER — Encounter (HOSPITAL_COMMUNITY): Payer: Medicare Other

## 2010-11-06 ENCOUNTER — Encounter (HOSPITAL_COMMUNITY): Payer: Medicare Other

## 2010-11-09 ENCOUNTER — Encounter (HOSPITAL_COMMUNITY): Payer: Medicare Other | Attending: Cardiovascular Disease

## 2010-11-09 DIAGNOSIS — E785 Hyperlipidemia, unspecified: Secondary | ICD-10-CM | POA: Insufficient documentation

## 2010-11-09 DIAGNOSIS — Z5189 Encounter for other specified aftercare: Secondary | ICD-10-CM | POA: Insufficient documentation

## 2010-11-09 DIAGNOSIS — J449 Chronic obstructive pulmonary disease, unspecified: Secondary | ICD-10-CM | POA: Insufficient documentation

## 2010-11-09 DIAGNOSIS — Z79899 Other long term (current) drug therapy: Secondary | ICD-10-CM | POA: Insufficient documentation

## 2010-11-09 DIAGNOSIS — Z7982 Long term (current) use of aspirin: Secondary | ICD-10-CM | POA: Insufficient documentation

## 2010-11-09 DIAGNOSIS — I214 Non-ST elevation (NSTEMI) myocardial infarction: Secondary | ICD-10-CM | POA: Insufficient documentation

## 2010-11-09 DIAGNOSIS — Z8249 Family history of ischemic heart disease and other diseases of the circulatory system: Secondary | ICD-10-CM | POA: Insufficient documentation

## 2010-11-09 DIAGNOSIS — I2510 Atherosclerotic heart disease of native coronary artery without angina pectoris: Secondary | ICD-10-CM | POA: Insufficient documentation

## 2010-11-09 DIAGNOSIS — F17200 Nicotine dependence, unspecified, uncomplicated: Secondary | ICD-10-CM | POA: Insufficient documentation

## 2010-11-09 DIAGNOSIS — J4489 Other specified chronic obstructive pulmonary disease: Secondary | ICD-10-CM | POA: Insufficient documentation

## 2010-11-09 DIAGNOSIS — I10 Essential (primary) hypertension: Secondary | ICD-10-CM | POA: Insufficient documentation

## 2010-11-10 ENCOUNTER — Encounter: Payer: Self-pay | Admitting: Pulmonary Disease

## 2010-11-10 ENCOUNTER — Ambulatory Visit (INDEPENDENT_AMBULATORY_CARE_PROVIDER_SITE_OTHER): Payer: Medicare Other | Admitting: Pulmonary Disease

## 2010-11-10 VITALS — BP 144/96 | HR 79 | Temp 98.4°F | Ht 65.0 in | Wt 180.2 lb

## 2010-11-10 DIAGNOSIS — R0989 Other specified symptoms and signs involving the circulatory and respiratory systems: Secondary | ICD-10-CM

## 2010-11-10 DIAGNOSIS — R0609 Other forms of dyspnea: Secondary | ICD-10-CM

## 2010-11-10 NOTE — Assessment & Plan Note (Signed)
The patient has dyspnea on exertion that I suspect is multifactorial.  She clearly has airways disease either in the form of chronic inflammation related to her ongoing smoking versus true COPD.  There is really no way to know without checking pulmonary function studies.  I've asked her to continue on symbicort, and that it is critical that she totally stop smoking.  I will see her back after her PFTs, and decide whether to add Spiriva to her regimen.  Other factors in her DOE include her weight and deconditioning, as well as her recent myocardial infarction.

## 2010-11-10 NOTE — Progress Notes (Signed)
Addended by: Manson Allan on: 11/10/2010 03:20 PM   Modules accepted: Orders

## 2010-11-10 NOTE — Progress Notes (Signed)
  Subjective:    Patient ID: Karla Nelson, female    DOB: 10-17-1965, 45 y.o.   MRN: [id number]  HPI The patient is a 45 year old female who I've been asked to see for dyspnea on exertion.  Patient has a long history of shortness of breath for many years, but actually feels that her breathing has been better  this year since she has cut back on her smoking and increased her exercise.  The patient was recently in the hospital with a myocardial infarction, and currently is attending cardiac rehabilitation.  She describes a dyspnea on exertion on flat ground at 1/2 mile or greater, but will get winded bringing groceries in from the car, going up one flight of stairs, or doing light housework.  The cough is really not a significant issue for her, and she has minimal mucus production.  She does complain of a lot of nasal congestion that is chronic.  She has been on symbicort for at least a year, and feels that it does help her.  A recent chest x-ray was normal.  The patient has never had formal pulmonary function studies.   Review of Systems  Constitutional: Negative for fever and unexpected weight change.  HENT: Positive for congestion, rhinorrhea, dental problem, postnasal drip and sinus pressure. Negative for ear pain, nosebleeds, sore throat, sneezing and trouble swallowing.   Eyes: Negative for redness and itching.  Respiratory: Positive for chest tightness, shortness of breath and wheezing. Negative for cough.   Cardiovascular: Negative for palpitations and leg swelling.  Gastrointestinal: Positive for nausea. Negative for vomiting.  Genitourinary: Negative for dysuria.  Musculoskeletal: Negative for joint swelling.  Skin: Negative for rash.  Neurological: Negative for headaches.  Hematological: Does not bruise/bleed easily.  Psychiatric/Behavioral: Negative for dysphoric mood. The patient is nervous/anxious.        Objective:   Physical Exam Constitutional:  Obese female, no acute  distress  HENT:  Nares patent without discharge, but hole in septum  Oropharynx without exudate, palate and uvula are normal  Eyes:  Perrla, eomi, no scleral icterus  Neck:  No JVD, no TMG  Cardiovascular:  Normal rate, regular rhythm, no rubs or gallops.  No murmurs        Intact distal pulses  Pulmonary :  Normal breath sounds, no stridor or respiratory distress   +rhonchi, no wheezing or crackles.   Abdominal:  Soft, nondistended, bowel sounds present.  No tenderness noted.   Musculoskeletal:  No lower extremity edema noted.  Lymph Nodes:  No cervical lymphadenopathy noted  Skin:  No cyanosis noted  Neurologic:  Alert, appropriate, moves all 4 extremities without obvious deficit.         Assessment & Plan:

## 2010-11-10 NOTE — Patient Instructions (Signed)
Stay on symbicort 2 puffs in am and pm everyday.  Keep mouth rinsed. Will schedule for breathing tests here in office, and see you the same day to review.  Based on these, may need to add another inhaler. Stop smoking 100%.

## 2010-11-11 ENCOUNTER — Encounter (HOSPITAL_COMMUNITY): Payer: Medicare Other

## 2010-11-13 ENCOUNTER — Encounter (HOSPITAL_COMMUNITY): Payer: Medicare Other

## 2010-11-16 ENCOUNTER — Encounter (HOSPITAL_COMMUNITY): Payer: Medicare Other

## 2010-11-18 ENCOUNTER — Encounter (HOSPITAL_COMMUNITY): Payer: Medicare Other

## 2010-11-19 NOTE — Discharge Summary (Signed)
Karla Nelson, Karla Nelson            ACCOUNT NO.:  [id number]  MEDICAL RECORD NO.:  [id number]  LOCATION:  [id number]                         FACILITY:  [place]  PHYSICIAN:  Minus Breeding, MD, FACCDATE OF BIRTH:  1965-10-08  DATE OF ADMISSION:  09/25/2010 DATE OF DISCHARGE:  09/27/2010                              DISCHARGE SUMMARY   PRIMARY CARDIOLOGIST:  Collier Salina C. Johnsie Cancel, MD, [facility].  PRIMARY CARE PROVIDER:  Jana Hakim, MD  DISCHARGE DIAGNOSIS:  Non-ST-segment elevation myocardial infarction.  SECONDARY DIAGNOSES: 1. Hypertensive emergency/malignant hypertension. 2. 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 3. Hyperlipidemia. 4. Tobacco abuse. 5. History of cleft palate. 6. History of asthma and bronchitis and chronic obstructive pulmonary     disease. 7. Status post cesarean section.  ALLERGIES:  No known drug allergies.  PROCEDURES: 1. Left heart cardiac catheterization performed on September 25, 2010,     revealing moderate nonobstructive multivessel CAD with an EF of 15-     75%.  Normal renal arteries.  Medical therapy recommend. 2. Two-D echocardiogram performed on September 27, 2010 and the reading     on this is currently pending.  HISTORY OF PRESENT ILLNESS:  A 45 year old female without prior cardiac history, who was in her usual state of health until approximately 2 p.m. on the day prior to admission which developed left-sided chest pain as well as left upper arm discomfort and throbbing.  Symptoms persisted all day and into the next day prompting her present to [facility] ED on August 17 where her ECG showed anterolateral T-wave changes which were not new, however, point-of-care troponin was elevated 1.7.  She was admitted for evaluation and management of non-ST-elevation MI.  Of note, presenting blood pressure to the ER was 241/137 which improved to 147/92 with IV nitroglycerin.  HOSPITAL COURSE:  The patient ruled in  for non-ST-segment elevation myocardial infarction eventually peaking.  Her CK at 265, MB 21.4, and troponin-I at 3.41.  She was placed on labetalol as well as hydralazine therapy and IV nitro was maintained as her blood pressure remained elevated.  She underwent diagnostic catheterization on August 17 revealing moderate nonobstructive CAD with normal LV function and normal renal arteries.  Medical therapy was recommended and it was felt that her biggest issue was malignant hypertension.  Following catheterization of blood pressure remained in the 150/90 range and her labetalol and hydralazine was further titrated with some improvement.  We have counseled her the importance of smoking cessation and medication adherence and regular blood pressure monitoring at home.  She will be discharged home today in good condition.  DISCHARGE LABS:  Hemoglobin 14.8, hematocrit 43.2, WBCs 14.0, platelets 284, INR 0.90.  Sodium 140, potassium 3.4, chloride 103, CO2 29, BUN 13, creatinine 0.97, glucose 110, total bilirubin 0.2, alkaline phosphatase 95, AST 30, ALT 15, total protein 6.7, albumin 3.1, calcium 8.9, hemoglobin A1c 6.8, CK 123, MB 6.7, troponin-I 2.46, total cholesterol 138, triglycerides 79, HDL 36, LDL 86.  TSH 2.457.  Urine drug screen was negative.  Urinalysis was negative.  DISPOSITION:  The patient will be discharged home today in good condition.  FOLLOWUP PLANS AND APPOINTMENT:  We will arrange followup  with Dr. Jenkins Rouge in approximately 2 weeks.  She should follow up with primary care provider as previously scheduled.  DISCHARGE MEDICATIONS: 1. Aspirin 81 mg daily. 2. Labetalol 300 mg t.i.d. 3. Nitroglycerin 0.4 mg sublingual p.r.n. chest pain. 4. Simvastatin 20 mg nightly. 5. Hydralazine 50 mg t.i.d. 6. Furosemide 40 mg daily p.r.n. swelling. 7. Lisinopril 40 mg nightly. 8. Methadone 10 mg/mL, 3.7 mL daily. 9. Multivitamin daily. 10.ProAir 1-2 puffs q.4 h.  p.r.n. 11.Symbicort 160/4.5 mcg 2 puffs t.i.d. 12.Fish oil 1000 mg nightly. 13.Super B complex over-the-counter 1 tablet nightly.  OUTSTANDING LABORATORY STUDIES:  Followup lipids and LFTs in approximately 6-8 weeks given new statin therapy.  DURATION OF DISCHARGE ENCOUNTER:  45 minutes including physician time.     Murray Hodgkins, ANP   ______________________________ Minus Breeding, MD, [facility]    CB/MEDQ  D:  09/27/2010  T:  09/27/2010  Job:  [id number]  cc:   Jana Hakim, M.D.  Electronically Signed by Murray Hodgkins ANP on 10/01/2010 03:03:17 PM Electronically Signed by Minus Breeding MD [facility] on 11/19/2010 01:28:38 PM

## 2010-11-20 ENCOUNTER — Encounter (HOSPITAL_COMMUNITY): Payer: Medicare Other

## 2010-11-23 ENCOUNTER — Encounter (HOSPITAL_COMMUNITY): Payer: Medicare Other

## 2010-11-25 ENCOUNTER — Encounter (HOSPITAL_COMMUNITY): Payer: Medicare Other

## 2010-11-27 ENCOUNTER — Encounter (HOSPITAL_COMMUNITY): Payer: Medicare Other

## 2010-11-30 ENCOUNTER — Encounter (HOSPITAL_COMMUNITY): Payer: Medicare Other

## 2010-12-02 ENCOUNTER — Encounter (HOSPITAL_COMMUNITY): Payer: Medicare Other

## 2010-12-04 ENCOUNTER — Encounter (HOSPITAL_COMMUNITY): Payer: Medicare Other

## 2010-12-07 ENCOUNTER — Encounter (HOSPITAL_COMMUNITY): Payer: Medicare Other

## 2010-12-07 ENCOUNTER — Encounter: Payer: Self-pay | Admitting: *Deleted

## 2010-12-08 ENCOUNTER — Ambulatory Visit (INDEPENDENT_AMBULATORY_CARE_PROVIDER_SITE_OTHER): Payer: Medicare Other | Admitting: Cardiovascular Disease

## 2010-12-08 ENCOUNTER — Encounter: Payer: Self-pay | Admitting: Cardiovascular Disease

## 2010-12-08 DIAGNOSIS — R9431 Abnormal electrocardiogram [ECG] [EKG]: Secondary | ICD-10-CM

## 2010-12-08 DIAGNOSIS — E782 Mixed hyperlipidemia: Secondary | ICD-10-CM

## 2010-12-08 DIAGNOSIS — R0989 Other specified symptoms and signs involving the circulatory and respiratory systems: Secondary | ICD-10-CM

## 2010-12-08 DIAGNOSIS — I10 Essential (primary) hypertension: Secondary | ICD-10-CM

## 2010-12-08 DIAGNOSIS — R0609 Other forms of dyspnea: Secondary | ICD-10-CM

## 2010-12-08 NOTE — Patient Instructions (Signed)
Your physician wants you to follow-up in: 34mo You will receive a reminder letter in the mail two months in advance. If you don't receive a letter, please call our office to schedule the follow-up appointment.

## 2010-12-08 NOTE — Assessment & Plan Note (Signed)
COPD  Counseled on smoking cessation for less than 10 minutes.  Yearly CXR  F/U [place].  Has inhalers.  Got flu shot

## 2010-12-08 NOTE — Assessment & Plan Note (Signed)
Improved continue current meds ?

## 2010-12-08 NOTE — Assessment & Plan Note (Signed)
LVH nonischemic changes based on recent cath.  Yearly ECG

## 2010-12-08 NOTE — Assessment & Plan Note (Signed)
Labs with primary .   Cholesterol is at goal.  Continue current dose of statin and diet Rx.  No myalgias or side effects.  F/U  LFT's in 6 months. Lab Results  Component Value Date   LDLCALC 86 09/25/2010

## 2010-12-08 NOTE — Progress Notes (Signed)
45 yo I have not seen before. 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 Seen by Dr Jeffie Pollock and cath showed 'nonobstructive" disease. Had 50% ostial D1 and 70% distal circumflex PLA disease. Normal EF. Some evidence of diastolic dysfunction with elevated EDP and BNP. Uncontrolled BP with no RAS on cath. More comliant with meds since last visit.   Long discussion about long term complications of unRx BP. Low sodium diet She is still smoking. Has had oxygen at home the last few years "as needed" Uses inhalers. Counseled for less than 10 minutes .Reasonable candidate for welbutrin and pulmonary referrel. Mild cough with no ferver chills or sputum. Stable to get bottles back for methadone clinic and note written.    ROS: Denies fever, malais, weight loss, blurry vision, decreased visual acuity, cough, sputum, SOB, hemoptysis, pleuritic pain, palpitaitons, heartburn, abdominal pain, melena, lower extremity edema, claudication, or rash.  All other systems reviewed and negative  General: Affect appropriate Healthy:  appears stated age 66: normal Neck supple with no adenopathy JVP normal no bruits no thyromegaly Lungs clear with no wheezing mild rhonchi and good diaphragmatic motion Heart:  S1/S2 no murmur,rub, gallop or click PMI normal Abdomen: benighn, BS positve, no tenderness, no AAA no bruit.  No HSM or HJR Distal pulses intact with no bruits No edema Neuro non-focal Skin warm and dry No muscular weakness   Current Outpatient Prescriptions  Medication Sig Dispense Refill  . albuterol (PROAIR HFA) 108 (90 BASE) MCG/ACT inhaler Inhale 2 puffs into the lungs every 6 (six) hours as needed.        Marland Kitchen aspirin 81 MG tablet Take 81 mg by mouth daily.        Marland Kitchen b complex vitamins tablet Take 1 tablet by mouth daily.        . budesonide-formoterol (SYMBICORT) 160-4.5 MCG/ACT inhaler Inhale 2 puffs into the lungs 2 (two) times daily.        Marland Kitchen buPROPion (WELLBUTRIN XL) 150 MG 24 hr tablet  Take 1 tablet (150 mg total) by mouth daily.  30 tablet  12  . hydrALAZINE (APRESOLINE) 50 MG tablet Take 50 mg by mouth 3 (three) times daily.        Marland Kitchen labetalol (NORMODYNE) 300 MG tablet Take 300 mg by mouth 3 (three) times daily.        Marland Kitchen lisinopril (PRINIVIL,ZESTRIL) 40 MG tablet Take 1 tablet (40 mg total) by mouth daily.  30 tablet  12  . METHADONE HCL PO LIQIUD 37       . Multiple Vitamin (MULTIVITAMIN) capsule Take 1 capsule by mouth daily.        . Omega-3 Fatty Acids (FISH OIL BURP-LESS PO) Take 1 tablet by mouth daily.          Allergies  Review of patient's allergies indicates no known allergies.  Electrocardiogram:  Assessment and Plan

## 2010-12-09 ENCOUNTER — Encounter (HOSPITAL_COMMUNITY): Payer: Medicare Other

## 2010-12-11 ENCOUNTER — Encounter (HOSPITAL_COMMUNITY): Payer: Medicare Other

## 2010-12-14 ENCOUNTER — Encounter (HOSPITAL_COMMUNITY): Payer: Medicare Other

## 2010-12-15 ENCOUNTER — Ambulatory Visit: Payer: Medicare Other | Admitting: Pulmonary Disease

## 2010-12-16 ENCOUNTER — Encounter (HOSPITAL_COMMUNITY): Payer: Medicare Other

## 2010-12-16 DIAGNOSIS — E785 Hyperlipidemia, unspecified: Secondary | ICD-10-CM | POA: Insufficient documentation

## 2010-12-16 DIAGNOSIS — Z5189 Encounter for other specified aftercare: Secondary | ICD-10-CM | POA: Insufficient documentation

## 2010-12-16 DIAGNOSIS — Z7982 Long term (current) use of aspirin: Secondary | ICD-10-CM | POA: Insufficient documentation

## 2010-12-16 DIAGNOSIS — Z8249 Family history of ischemic heart disease and other diseases of the circulatory system: Secondary | ICD-10-CM | POA: Insufficient documentation

## 2010-12-16 DIAGNOSIS — I10 Essential (primary) hypertension: Secondary | ICD-10-CM | POA: Insufficient documentation

## 2010-12-16 DIAGNOSIS — Z79899 Other long term (current) drug therapy: Secondary | ICD-10-CM | POA: Insufficient documentation

## 2010-12-16 DIAGNOSIS — F17200 Nicotine dependence, unspecified, uncomplicated: Secondary | ICD-10-CM | POA: Insufficient documentation

## 2010-12-16 DIAGNOSIS — I2510 Atherosclerotic heart disease of native coronary artery without angina pectoris: Secondary | ICD-10-CM | POA: Insufficient documentation

## 2010-12-16 DIAGNOSIS — J449 Chronic obstructive pulmonary disease, unspecified: Secondary | ICD-10-CM | POA: Insufficient documentation

## 2010-12-16 DIAGNOSIS — J4489 Other specified chronic obstructive pulmonary disease: Secondary | ICD-10-CM | POA: Insufficient documentation

## 2010-12-16 DIAGNOSIS — I214 Non-ST elevation (NSTEMI) myocardial infarction: Secondary | ICD-10-CM | POA: Insufficient documentation

## 2010-12-16 NOTE — Progress Notes (Signed)
Pt returned to exercise today after a long absence.

## 2010-12-18 ENCOUNTER — Encounter (HOSPITAL_COMMUNITY): Payer: Medicare Other

## 2010-12-21 ENCOUNTER — Encounter (HOSPITAL_COMMUNITY): Payer: Medicare Other

## 2010-12-23 ENCOUNTER — Encounter (HOSPITAL_COMMUNITY): Payer: Medicare Other

## 2010-12-23 NOTE — Progress Notes (Signed)
Pt c/o continued dyspnea on exertion, while walking from parking lot to class carrying heavy book bag on M, W, F.  Denies sx with other levels of exertion or other activities.  Denies sx with cardiac rehab exercises.  O2 sat -94% during exercise while aymptomatic.  Denies chest tightness or discomfort or anginal equivalent sx..  Pt reports usually takes rest break then resumes activity.  Sometimes she admits to using proventil MDI without definitive relief of her sx.  Pt advised to continue current regimen especially taking meds as prescribed , encouraged to continue active participation in cardiac rehab to improve conditioning, STOP smoking, plan to allow more time to get to class to avoid rushing.  Offered to contact pulmonary to r/s appt, pt declined prefers to keep future pulmonary appt as scheduled.  Pt verbalized understanding-jrion,rn

## 2010-12-25 ENCOUNTER — Encounter (HOSPITAL_COMMUNITY): Payer: Medicare Other

## 2010-12-25 ENCOUNTER — Encounter (HOSPITAL_COMMUNITY)
Admission: RE | Admit: 2010-12-25 | Discharge: 2010-12-25 | Disposition: A | Discharge status: Home / Self Care | Payer: Medicare Other | Source: Ambulatory Visit | Attending: Cardiovascular Disease | Admitting: Cardiovascular Disease

## 2010-12-28 ENCOUNTER — Encounter (HOSPITAL_COMMUNITY): Payer: Medicare Other

## 2010-12-30 ENCOUNTER — Encounter (HOSPITAL_COMMUNITY): Payer: Medicare Other

## 2010-12-30 ENCOUNTER — Encounter (HOSPITAL_COMMUNITY)
Admission: RE | Admit: 2010-12-30 | Discharge: 2010-12-30 | Disposition: A | Discharge status: Home / Self Care | Payer: Medicare Other | Source: Ambulatory Visit | Attending: Cardiovascular Disease | Admitting: Cardiovascular Disease

## 2011-01-01 ENCOUNTER — Encounter (HOSPITAL_COMMUNITY): Payer: Medicare Other

## 2011-01-04 ENCOUNTER — Encounter (HOSPITAL_COMMUNITY)
Admission: RE | Admit: 2011-01-04 | Discharge: 2011-01-04 | Disposition: A | Discharge status: Home / Self Care | Payer: Medicare Other | Source: Ambulatory Visit | Attending: Cardiovascular Disease | Admitting: Cardiovascular Disease

## 2011-01-04 ENCOUNTER — Encounter (HOSPITAL_COMMUNITY): Payer: Medicare Other

## 2011-01-06 ENCOUNTER — Encounter (HOSPITAL_COMMUNITY): Payer: Medicare Other

## 2011-01-06 ENCOUNTER — Encounter (HOSPITAL_COMMUNITY): Payer: Self-pay | Admitting: Cardiac Rehabilitation

## 2011-01-06 NOTE — Progress Notes (Signed)
PC received from pt  She will be absent today c/o chest congestion, will go to Urgent care for evaluation and treatment.

## 2011-01-08 ENCOUNTER — Encounter (HOSPITAL_COMMUNITY)
Admission: RE | Admit: 2011-01-08 | Discharge: 2011-01-08 | Disposition: A | Discharge status: Home / Self Care | Payer: Medicare Other | Source: Ambulatory Visit | Attending: Cardiovascular Disease | Admitting: Cardiovascular Disease

## 2011-01-08 ENCOUNTER — Encounter (HOSPITAL_COMMUNITY): Payer: Medicare Other

## 2011-01-11 ENCOUNTER — Encounter (HOSPITAL_COMMUNITY): Payer: Medicare Other

## 2011-01-11 ENCOUNTER — Encounter (HOSPITAL_COMMUNITY)
Admission: RE | Admit: 2011-01-11 | Discharge: 2011-01-11 | Disposition: A | Discharge status: Home / Self Care | Payer: Medicare Other | Source: Ambulatory Visit | Attending: Cardiovascular Disease | Admitting: Cardiovascular Disease

## 2011-01-11 ENCOUNTER — Other Ambulatory Visit: Payer: Self-pay

## 2011-01-11 ENCOUNTER — Emergency Department (HOSPITAL_COMMUNITY)
Admission: EM | Admit: 2011-01-11 | Discharge: 2011-01-12 | Discharge status: Against Medical Advice | Payer: Medicare Other | Attending: Emergency Medicine | Admitting: Emergency Medicine

## 2011-01-11 ENCOUNTER — Encounter (HOSPITAL_COMMUNITY): Payer: Self-pay

## 2011-01-11 DIAGNOSIS — J449 Chronic obstructive pulmonary disease, unspecified: Secondary | ICD-10-CM | POA: Insufficient documentation

## 2011-01-11 DIAGNOSIS — E785 Hyperlipidemia, unspecified: Secondary | ICD-10-CM | POA: Insufficient documentation

## 2011-01-11 DIAGNOSIS — Z7982 Long term (current) use of aspirin: Secondary | ICD-10-CM | POA: Insufficient documentation

## 2011-01-11 DIAGNOSIS — M79609 Pain in unspecified limb: Secondary | ICD-10-CM | POA: Insufficient documentation

## 2011-01-11 DIAGNOSIS — I214 Non-ST elevation (NSTEMI) myocardial infarction: Secondary | ICD-10-CM | POA: Insufficient documentation

## 2011-01-11 DIAGNOSIS — J4489 Other specified chronic obstructive pulmonary disease: Secondary | ICD-10-CM | POA: Insufficient documentation

## 2011-01-11 DIAGNOSIS — I2510 Atherosclerotic heart disease of native coronary artery without angina pectoris: Secondary | ICD-10-CM | POA: Insufficient documentation

## 2011-01-11 DIAGNOSIS — F17200 Nicotine dependence, unspecified, uncomplicated: Secondary | ICD-10-CM | POA: Insufficient documentation

## 2011-01-11 DIAGNOSIS — Z79899 Other long term (current) drug therapy: Secondary | ICD-10-CM | POA: Insufficient documentation

## 2011-01-11 DIAGNOSIS — I10 Essential (primary) hypertension: Secondary | ICD-10-CM | POA: Insufficient documentation

## 2011-01-11 DIAGNOSIS — Z8249 Family history of ischemic heart disease and other diseases of the circulatory system: Secondary | ICD-10-CM | POA: Insufficient documentation

## 2011-01-11 DIAGNOSIS — Z5189 Encounter for other specified aftercare: Secondary | ICD-10-CM | POA: Insufficient documentation

## 2011-01-11 NOTE — ED Notes (Signed)
Patient sent by cardiology for further evaluation of left extremity numbness/tingling while walking on a treadmill (for testing purposes) today. Patient denies tingling, SOB, chest pain, n/v at this time.

## 2011-01-11 NOTE — ED Notes (Signed)
Pt called x 2 with no answer  

## 2011-01-11 NOTE — ED Notes (Signed)
Pt. Was in cardiac rehab this afternoon and exercising, and developed lt. Arm burning, lasted about 2 minutes.  Pt. Feel fine presently. Pt. Denies any sob, chest pain n/v

## 2011-01-11 NOTE — Progress Notes (Signed)
Pt c/o left arm "burning and tingling" with exercise today.  BP-196/90.  Exercise stopped.  Arm sx relieved when exercise stopped.  BP-168/84 at rest.   Pt reports this is similar sx she experiences walking to class from parking lot while carrying heavy bookbag.   Pt states she has taken all meds as prescribed.  Dr. Kyla Balzarine office made aware.  Pt advised to present to ED.  ED triage nurse informed.  Pt transported via wheelchair on [place] monitor.  -jr,rn

## 2011-01-12 ENCOUNTER — Telehealth (HOSPITAL_COMMUNITY): Payer: Self-pay | Admitting: Cardiac Rehabilitation

## 2011-01-12 NOTE — Telephone Encounter (Signed)
Spoke with pt's spouse and appointment scheduled with Truitt Merle.

## 2011-01-12 NOTE — Telephone Encounter (Signed)
Fu call Pt said she went to er and waited too long to be seen. She wanted to talk to you. Please call

## 2011-01-13 ENCOUNTER — Encounter: Payer: Self-pay | Admitting: Nurse Practitioner

## 2011-01-13 ENCOUNTER — Ambulatory Visit (INDEPENDENT_AMBULATORY_CARE_PROVIDER_SITE_OTHER): Payer: Medicare Other | Admitting: Nurse Practitioner

## 2011-01-13 ENCOUNTER — Encounter (HOSPITAL_COMMUNITY): Payer: Medicare Other

## 2011-01-13 VITALS — BP 160/100 | HR 78 | Ht 65.0 in | Wt 176.0 lb

## 2011-01-13 DIAGNOSIS — I2510 Atherosclerotic heart disease of native coronary artery without angina pectoris: Secondary | ICD-10-CM

## 2011-01-13 DIAGNOSIS — I10 Essential (primary) hypertension: Secondary | ICD-10-CM

## 2011-01-13 DIAGNOSIS — Z720 Tobacco use: Secondary | ICD-10-CM

## 2011-01-13 DIAGNOSIS — F17200 Nicotine dependence, unspecified, uncomplicated: Secondary | ICD-10-CM

## 2011-01-13 MED ORDER — HYDROCHLOROTHIAZIDE 25 MG PO TABS: 25.0000 mg | ORAL_TABLET | Freq: Every day | ORAL | Status: DC

## 2011-01-13 NOTE — Assessment & Plan Note (Signed)
She understands the need for cessation. She is currently trying Wellbutrin.

## 2011-01-13 NOTE — Assessment & Plan Note (Signed)
Blood pressure is up. I have started her on HCTZ 25 mg daily. We will check a BMET today. She does have a BP monitor at home and will check some readings at home. I will see her back in 10 days. Recheck BMET on return. Patient is agreeable to this plan and will call if any problems develop in the interim.

## 2011-01-13 NOTE — Patient Instructions (Signed)
We need to check some labs today.  Keep working on your smoking.   We are going to start you on HCTZ 25 mg daily for your blood pressure.  I will see you in a 10 days.  Please keep a diary of your blood pressure over this next 10 days.   Call the [facility] office at [phone] if you have any questions, problems or concerns.

## 2011-01-13 NOTE — Assessment & Plan Note (Signed)
She has a history of NSTEMI back in August and has moderate nonobstructive CAD per cath. Blood pressure remains elevated and needs further treatment.

## 2011-01-13 NOTE — Progress Notes (Signed)
Karla Nelson Date of Birth: [birth year] Medical Record [id number]  History of Present Illness: Karla Nelson is seen today for a work in visit. She is seen for Dr. Johnsie Cancel. She has a known history of CAD, recent NSTEMI back in August. Cath shows moderate nonobstructive CAD. She has ongoing tobacco abuse. She is down to about 3 cigarettes per day. She realizes the need to stop. She also has significant HTN. She has been taking her medicines.   She comes in today at the request of cardiac rehab. She was exercising there earlier in the week. When the incline was elevated on the treadmill, she began to have numbness in her feet and in her right arm. No actual chest pain. She was not really concerned about the episode. Her blood pressure was grossly elevated. She was referred to the ER where she waited 7 hours without being seen. She subsequently left. She feels like she has been doing ok overall.   Current Outpatient Prescriptions on File Prior to Visit  Medication Sig Dispense Refill  . albuterol (PROAIR HFA) 108 (90 BASE) MCG/ACT inhaler Inhale 2 puffs into the lungs every 6 (six) hours as needed.        Marland Kitchen aspirin 81 MG tablet Take 81 mg by mouth daily.        Marland Kitchen b complex vitamins tablet Take 1 tablet by mouth daily.        . budesonide-formoterol (SYMBICORT) 160-4.5 MCG/ACT inhaler Inhale 2 puffs into the lungs 2 (two) times daily.        Marland Kitchen buPROPion (WELLBUTRIN XL) 150 MG 24 hr tablet Take 1 tablet (150 mg total) by mouth daily.  30 tablet  12  . hydrALAZINE (APRESOLINE) 50 MG tablet Take 50 mg by mouth 3 (three) times daily.        Marland Kitchen labetalol (NORMODYNE) 300 MG tablet Take 300 mg by mouth 3 (three) times daily.        Marland Kitchen lisinopril (PRINIVIL,ZESTRIL) 40 MG tablet Take 40 mg by mouth at bedtime.        . METHADONE HCL PO Take 37 mg by mouth every morning. LIQIUD 37      . Multiple Vitamin (MULTIVITAMIN) capsule Take 1 capsule by mouth daily.        . Omega-3 Fatty Acids (FISH OIL BURP-LESS  PO) Take 1 tablet by mouth daily.        . hydrochlorothiazide (HYDRODIURIL) 25 MG tablet Take 1 tablet (25 mg total) by mouth daily.  30 tablet  6    No Known Allergies  Past Medical History  Diagnosis Date  . HTN (hypertension)     Has normal renal arteries.  . Cleft palate   . NSTEMI (non-ST elevated myocardial infarction) 09-25-2010  . DYSPNEA ON EXERTION 03/08/2007  . ELECTROCARDIOGRAM, ABNORMAL 02/13/2010  . CAD (coronary artery disease)     NSTEMI with cath in August 2012 with nonobstructive disease, 50% ostial D1 and 70% distal LCX and PL disease. Normal EF. Has diastolic dysfunction with elevated EDP  . Tobacco abuse disorder   . COPD (chronic obstructive pulmonary disease)   . Hyperlipidemia   . Asthma     Past Surgical History  Procedure Date  . Cesarean section   . Cardiac catheterization Aug 2012    Moderate nonobstructive multivessel CAD with an EF of 70 to 75%    History  Smoking status  . Current Everyday Smoker -- 0.3 packs/day for 31 years  . Types: Cigarettes  Smokeless tobacco  .  Not on file  Comment: 7-8 CIGS A DAY    History  Alcohol Use No    Family History  Problem Relation Age of Onset  . Diabetes    . Hypertension    . Asthma Mother   . Asthma Brother   . Heart disease Mother     Review of Systems: The review of systems is per the HPI. She has some wheezing that is chronic. She remains very busy with school, work and home life. No actual chest pain. Tries to watch her salt. Not too short of breath at this time. All other systems were reviewed and are negative.  Physical Exam: BP 160/100  Pulse 78  Ht 5\' 5"  (1.651 m)  Wt 176 lb (79.833 kg)  BMI 29.29 kg/m2  LMP 12/21/2010 Patient is pleasant and in no acute distress. Skin is warm and dry. Color is normal.  HEENT is unremarkable. Normocephalic/atraumatic. PERRL. Sclera are nonicteric. Neck is supple. No masses. No JVD. Lungs have some scattered wheezes today. Cardiac exam shows a  regular rate and rhythm. Abdomen is soft. Extremities are without edema. Gait and ROM are intact. No gross neurologic deficits noted.   LABORATORY DATA: EKG shows sinus rhythm. She has inferolateral ST changes which are unchanged from her prior tracing.   Assessment / Plan:

## 2011-01-14 LAB — BASIC METABOLIC PANEL
BUN: 14 mg/dL (ref 6–23)
CO2: 30 mEq/L (ref 19–32)
Calcium: 9.1 mg/dL (ref 8.4–10.5)
Chloride: 103 mEq/L (ref 96–112)
Creatinine, Ser: 1.2 mg/dL (ref 0.4–1.2)
GFR: 63.06 mL/min (ref >60.00)
Glucose, Bld: 92 mg/dL (ref 70–99)
Potassium: 4.3 mEq/L (ref 3.5–5.1)
Sodium: 139 mEq/L (ref 135–145)

## 2011-01-15 ENCOUNTER — Encounter (HOSPITAL_COMMUNITY): Payer: Medicare Other

## 2011-01-18 ENCOUNTER — Encounter (HOSPITAL_COMMUNITY): Payer: Medicare Other

## 2011-01-19 ENCOUNTER — Ambulatory Visit: Payer: Medicare Other | Admitting: Pulmonary Disease

## 2011-01-20 ENCOUNTER — Encounter (HOSPITAL_COMMUNITY): Payer: Medicare Other

## 2011-01-22 ENCOUNTER — Encounter (HOSPITAL_COMMUNITY): Payer: Medicare Other

## 2011-01-27 ENCOUNTER — Emergency Department (INDEPENDENT_AMBULATORY_CARE_PROVIDER_SITE_OTHER)
Admission: EM | Admit: 2011-01-27 | Discharge: 2011-01-27 | Disposition: A | Discharge status: Home / Self Care | Payer: Medicare Other | Source: Home / Self Care

## 2011-01-27 ENCOUNTER — Encounter (HOSPITAL_COMMUNITY): Payer: Self-pay

## 2011-01-27 DIAGNOSIS — J45901 Unspecified asthma with (acute) exacerbation: Secondary | ICD-10-CM

## 2011-01-27 DIAGNOSIS — J441 Chronic obstructive pulmonary disease with (acute) exacerbation: Secondary | ICD-10-CM

## 2011-01-27 MED ORDER — ALBUTEROL SULFATE (5 MG/ML) 0.5% IN NEBU
5.0000 mg | INHALATION_SOLUTION | Freq: Once | RESPIRATORY_TRACT | Status: AC
  Administered 2011-01-27: 5 mg via RESPIRATORY_TRACT

## 2011-01-27 MED ORDER — ALBUTEROL SULFATE (5 MG/ML) 0.5% IN NEBU
INHALATION_SOLUTION | RESPIRATORY_TRACT | Status: AC
  Filled 2011-01-27: qty 1

## 2011-01-27 MED ORDER — PREDNISONE 20 MG PO TABS: 20.0000 mg | ORAL_TABLET | Freq: Two times a day (BID) | ORAL | Status: AC

## 2011-01-27 MED ORDER — IPRATROPIUM BROMIDE 0.02 % IN SOLN
0.5000 mg | Freq: Once | RESPIRATORY_TRACT | Status: AC
  Administered 2011-01-27: 0.5 mg via RESPIRATORY_TRACT

## 2011-01-27 NOTE — ED Notes (Signed)
C/o nasal congestion, head congestion, cough and soreness to chest and back with coughing.  Denies fever, sneezing or other sx

## 2011-01-27 NOTE — ED Provider Notes (Signed)
History     CSN: [id number] Arrival date & time: 01/27/2011  8:31 AM   None     Chief Complaint  Patient presents with  . URI  . Generalized Body Aches    (Consider location/radiation/quality/duration/timing/severity/associated sxs/prior treatment) HPI Comments: Pt presents with c/o chest congestion x 2-3 days. Cough is nonproductive. When she coughs her upper chest hurts. She has a hx of asthma, has some dyspnea but no wheezing, and is improved with use of her albuterol inhaler. She denies fever or nasal congestion.   Patient is a 45 y.o. female presenting with cough. The history is provided by the patient.  Cough This is a new problem. The current episode started more than 2 days ago. The problem occurs every few minutes. The problem has not changed since onset.The cough is non-productive. There has been no fever. Associated symptoms include shortness of breath. Pertinent negatives include no chest pain, no chills, no ear pain, no headaches, no rhinorrhea, no sore throat, no myalgias and no wheezing. She has tried nothing for the symptoms. She is a smoker. Her past medical history is significant for asthma.    Past Medical History  Diagnosis Date  . HTN (hypertension)     Has normal renal arteries.  . Cleft palate   . NSTEMI (non-ST elevated myocardial infarction) 09-25-2010  . DYSPNEA ON EXERTION 03/08/2007  . ELECTROCARDIOGRAM, ABNORMAL 02/13/2010  . CAD (coronary artery disease)     NSTEMI with cath in August 2012 with nonobstructive disease, 50% ostial D1 and 70% distal LCX and PL disease. Normal EF. Has diastolic dysfunction with elevated EDP  . Tobacco abuse disorder   . COPD (chronic obstructive pulmonary disease)   . Hyperlipidemia   . Asthma     Past Surgical History  Procedure Date  . Cesarean section   . Cardiac catheterization Aug 2012    Moderate nonobstructive multivessel CAD with an EF of 70 to 75%  . Coronary angioplasty with stent placement     Family  History  Problem Relation Age of Onset  . Diabetes    . Hypertension    . Asthma Mother   . Asthma Brother   . Heart disease Mother     History  Substance Use Topics  . Smoking status: Current Everyday Smoker -- 1.0 packs/day for 31 years    Types: Cigarettes  . Smokeless tobacco: Not on file   Comment: 7-8 CIGS A DAY  . Alcohol Use: No    OB History    Grav Para Term Preterm Abortions TAB SAB Ect Mult Living            1      Review of Systems  Constitutional: Negative for fever and chills.  HENT: Negative for ear pain, sore throat and rhinorrhea.   Respiratory: Positive for cough and shortness of breath. Negative for wheezing.   Cardiovascular: Negative for chest pain.  Musculoskeletal: Negative for myalgias.  Neurological: Negative for headaches.    Allergies  Review of patient's allergies indicates no known allergies.  Home Medications   Current Outpatient Rx  Name Route Sig Dispense Refill  . ALBUTEROL SULFATE HFA 108 (90 BASE) MCG/ACT IN AERS Inhalation Inhale 2 puffs into the lungs every 6 (six) hours as needed.      . ASPIRIN 81 MG PO TABS Oral Take 81 mg by mouth daily.      . B COMPLEX PO TABS Oral Take 1 tablet by mouth daily.      Marland Kitchen  BUDESONIDE-FORMOTEROL FUMARATE 160-4.5 MCG/ACT IN AERO Inhalation Inhale 2 puffs into the lungs 2 (two) times daily.      . BUPROPION HCL ER (XL) 150 MG PO TB24 Oral Take 1 tablet (150 mg total) by mouth daily. 30 tablet 12  . HYDRALAZINE HCL 50 MG PO TABS Oral Take 50 mg by mouth 3 (three) times daily.      Marland Kitchen HYDROCHLOROTHIAZIDE 25 MG PO TABS Oral Take 1 tablet (25 mg total) by mouth daily. 30 tablet 6  . LABETALOL HCL 300 MG PO TABS Oral Take 300 mg by mouth 3 (three) times daily.      Marland Kitchen LISINOPRIL 40 MG PO TABS Oral Take 40 mg by mouth at bedtime.      . METHADONE HCL PO Oral Take 37 mg by mouth every morning. LIQIUD 37    . MULTIVITAMINS PO CAPS Oral Take 1 capsule by mouth daily.      Marland Kitchen FISH OIL BURP-LESS PO Oral Take 1  tablet by mouth daily.      Marland Kitchen PREDNISONE 20 MG PO TABS Oral Take 1 tablet (20 mg total) by mouth 2 (two) times daily. 10 tablet 0    BP 125/80  Pulse 65  Temp(Src) 98.1 F (36.7 C) (Oral)  Resp 20  SpO2 95%  LMP 01/20/2011  Physical Exam  Nursing note and vitals reviewed. Constitutional: She appears well-developed and well-nourished. No distress.  HENT:  Head: Normocephalic and atraumatic.  Right Ear: Tympanic membrane, external ear and ear canal normal.  Left Ear: Tympanic membrane, external ear and ear canal normal.  Nose: Nose normal.  Mouth/Throat: Uvula is midline, oropharynx is clear and moist and mucous membranes are normal. No oropharyngeal exudate, posterior oropharyngeal edema or posterior oropharyngeal erythema.  Neck: Neck supple.  Cardiovascular: Normal rate, regular rhythm and normal heart sounds.   Pulmonary/Chest: Effort normal and breath sounds normal. No respiratory distress. She has no decreased breath sounds. She has no wheezes. She has no rhonchi. She has no rales.  Lymphadenopathy:    She has no cervical adenopathy.  Neurological: She is alert.  Skin: Skin is warm and dry.  Psychiatric: She has a normal mood and affect.    ED Course  Procedures (including critical care time)  Labs Reviewed - No data to display No results found.   1. Acute exacerbation of COPD with asthma       MDM   Pulse Ox 95-96% after NMT.        Carmel [place], [place] 01/27/11 1045

## 2011-01-27 NOTE — ED Provider Notes (Signed)
Medical screening examination/treatment/procedure(s) were performed by non-physician practitioner and as supervising physician I was immediately available for consultation/collaboration.  Cherly Beach MD   Cherly Beach, MD 01/27/11 2130

## 2011-01-28 ENCOUNTER — Ambulatory Visit: Payer: Medicare Other | Admitting: Nurse Practitioner

## 2011-02-16 ENCOUNTER — Ambulatory Visit: Payer: Medicare Other | Admitting: Pulmonary Disease

## 2011-03-11 NOTE — Progress Notes (Signed)
Cardiac Rehabilitation Program Progress Report   Orientation:  10/15/2010 Graduate Date:  Discharge Date:  01/08/2011 # of sessions completed: 12  Cardiologist: Johnsie Cancel Family MD:  Encarnacion Slates Time:  [id number]  A.  Exercise Program:  Tolerates exercise @ 2.9 METS for 30 minutes, Poor attendance due to work schedule and Discharged  B.  Mental Health:    C.  Education/Instruction/Skills  Uses Perceived Exertion Scale and/or Dyspnea Scale and Attended 1 education class    D.  Nutrition/Weight Control/Body Composition:  Patient has lost 0.3 kg  *This section completed by Derek Mound, Reed Pandy, RD, LDN, CDE  E.  Blood Lipids    Lab Results  Component Value Date   CHOL 138 09/25/2010     Lab Results  Component Value Date   TRIG 79 09/25/2010     Lab Results  Component Value Date   HDL 36* 09/25/2010     Lab Results  Component Value Date   CHOLHDL 3.8 09/25/2010     No results found for this basename: LDLDIRECT      F.  Lifestyle Changes:    G.  Symptoms noted with exercise:  Angina exertional, Questionable angina, Fatigue, Resting hypertension and Exertional hypertension  Report Completed By:  Dorann Ou   Comments:  Pt discharged due to poor attendance. Electronically signed by Thea Silversmith MS on 03/11/2011 at [phone].

## 2011-03-24 ENCOUNTER — Ambulatory Visit: Payer: Medicare Other | Admitting: Pulmonary Disease

## 2011-05-13 ENCOUNTER — Other Ambulatory Visit: Payer: Self-pay | Admitting: *Deleted

## 2011-05-13 MED ORDER — BUPROPION HCL ER (XL) 150 MG PO TB24: 150.0000 mg | ORAL_TABLET | Freq: Every day | ORAL | Status: DC

## 2011-05-13 MED ORDER — LABETALOL HCL 300 MG PO TABS: 300.0000 mg | ORAL_TABLET | Freq: Three times a day (TID) | ORAL | Status: DC

## 2011-05-13 MED ORDER — LISINOPRIL 40 MG PO TABS: 40.0000 mg | ORAL_TABLET | Freq: Every day | ORAL | Status: DC

## 2011-05-13 MED ORDER — HYDRALAZINE HCL 50 MG PO TABS: 50.0000 mg | ORAL_TABLET | Freq: Three times a day (TID) | ORAL | Status: DC

## 2011-05-13 MED ORDER — HYDROCHLOROTHIAZIDE 25 MG PO TABS: 25.0000 mg | ORAL_TABLET | Freq: Every day | ORAL | Status: DC

## 2011-05-14 ENCOUNTER — Other Ambulatory Visit: Payer: Self-pay

## 2011-05-14 MED ORDER — HYDROCHLOROTHIAZIDE 25 MG PO TABS: 25.0000 mg | ORAL_TABLET | Freq: Every day | ORAL | Status: DC

## 2011-05-25 ENCOUNTER — Emergency Department (HOSPITAL_COMMUNITY)
Admission: EM | Admit: 2011-05-25 | Discharge: 2011-05-25 | Disposition: A | Discharge status: Home / Self Care | Payer: Medicare Other | Attending: Emergency Medicine | Admitting: Emergency Medicine

## 2011-05-25 ENCOUNTER — Encounter (HOSPITAL_COMMUNITY): Payer: Self-pay

## 2011-05-25 DIAGNOSIS — E785 Hyperlipidemia, unspecified: Secondary | ICD-10-CM | POA: Insufficient documentation

## 2011-05-25 DIAGNOSIS — Z79899 Other long term (current) drug therapy: Secondary | ICD-10-CM | POA: Insufficient documentation

## 2011-05-25 DIAGNOSIS — J449 Chronic obstructive pulmonary disease, unspecified: Secondary | ICD-10-CM | POA: Insufficient documentation

## 2011-05-25 DIAGNOSIS — R062 Wheezing: Secondary | ICD-10-CM

## 2011-05-25 DIAGNOSIS — J4489 Other specified chronic obstructive pulmonary disease: Secondary | ICD-10-CM | POA: Insufficient documentation

## 2011-05-25 DIAGNOSIS — I10 Essential (primary) hypertension: Secondary | ICD-10-CM | POA: Insufficient documentation

## 2011-05-25 MED ORDER — PREDNISONE 20 MG PO TABS: 40.0000 mg | ORAL_TABLET | Freq: Every day | ORAL | Status: AC

## 2011-05-25 MED ORDER — ALBUTEROL SULFATE (5 MG/ML) 0.5% IN NEBU
5.0000 mg | INHALATION_SOLUTION | Freq: Once | RESPIRATORY_TRACT | Status: AC
  Administered 2011-05-25: 5 mg via RESPIRATORY_TRACT
  Filled 2011-05-25: qty 1

## 2011-05-25 MED ORDER — PREDNISONE 20 MG PO TABS
60.0000 mg | ORAL_TABLET | Freq: Once | ORAL | Status: AC
  Administered 2011-05-25: 60 mg via ORAL
  Filled 2011-05-25: qty 3

## 2011-05-25 MED ORDER — IPRATROPIUM BROMIDE 0.02 % IN SOLN
0.5000 mg | Freq: Once | RESPIRATORY_TRACT | Status: AC
  Administered 2011-05-25: 0.5 mg via RESPIRATORY_TRACT
  Filled 2011-05-25: qty 2.5

## 2011-05-25 NOTE — ED Notes (Signed)
Pt complains of sob and difficulty breathing, sts medicaiton maybe expried, pt sts started 2 days ago.

## 2011-05-25 NOTE — ED Provider Notes (Signed)
History     CSN: [id number]  Arrival date & time 05/25/11  1113   First MD Initiated Contact with Patient 05/25/11 1122      Chief Complaint  Patient presents with  . Asthma    (Consider location/radiation/quality/duration/timing/severity/associated sxs/prior treatment) HPI Comments: Patient with history of chronic asthma and COPD, prescribed home oxygen however she is not typically compliant -- presents with worsening wheezing over the past one to 2 days. She's also had a nonproductive cough. Patient states her wheezing started after she developed upper respiratory tract symptoms including runny nose, nasal congestion. Patient has been using albuterol rescue inhaler multiple times a day. She continues to take her symbicort. Patient denies fevers, chest pain, nausea, vomiting, diarrhea, abdominal pain, urinary symptoms. Patient with history of NSTEMI last year.   Patient is a 46 y.o. female presenting with shortness of breath. The history is provided by the patient.  Shortness of Breath  The current episode started yesterday. The onset was gradual. The problem has been gradually worsening. The problem is moderate. Associated symptoms include rhinorrhea, cough, shortness of breath and wheezing. Pertinent negatives include no chest pain, no chest pressure, no fever and no sore throat. She has had no prior steroid use. She has had no prior hospitalizations. She has had no prior ICU admissions. She has had no prior intubations. Her past medical history is significant for asthma.    Past Medical History  Diagnosis Date  . HTN (hypertension)     Has normal renal arteries.  . Cleft palate   . NSTEMI (non-ST elevated myocardial infarction) 09-25-2010  . DYSPNEA ON EXERTION 03/08/2007  . ELECTROCARDIOGRAM, ABNORMAL 02/13/2010  . CAD (coronary artery disease)     NSTEMI with cath in August 2012 with nonobstructive disease, 50% ostial D1 and 70% distal LCX and PL disease. Normal EF. Has diastolic  dysfunction with elevated EDP  . Tobacco abuse disorder   . COPD (chronic obstructive pulmonary disease)   . Hyperlipidemia   . Asthma     Past Surgical History  Procedure Date  . Cesarean section   . Cardiac catheterization Aug 2012    Moderate nonobstructive multivessel CAD with an EF of 70 to 75%  . Coronary angioplasty with stent placement     Family History  Problem Relation Age of Onset  . Diabetes    . Hypertension    . Asthma Mother   . Asthma Brother   . Heart disease Mother     History  Substance Use Topics  . Smoking status: Current Everyday Smoker -- 1.0 packs/day for 31 years    Types: Cigarettes  . Smokeless tobacco: Not on file   Comment: 7-8 CIGS A DAY  . Alcohol Use: No    OB History    Grav Para Term Preterm Abortions TAB SAB Ect Mult Living            1      Review of Systems  Constitutional: Negative for fever.  HENT: Positive for congestion and rhinorrhea. Negative for sore throat.   Eyes: Negative for redness.  Respiratory: Positive for cough, shortness of breath and wheezing.   Cardiovascular: Negative for chest pain.  Gastrointestinal: Negative for nausea, vomiting, abdominal pain and diarrhea.  Genitourinary: Negative for dysuria.  Musculoskeletal: Negative for myalgias.  Skin: Negative for rash.  Neurological: Negative for headaches.    Allergies  Review of patient's allergies indicates no known allergies.  Home Medications   Current Outpatient Rx  Name Route Sig  Dispense Refill  . ALBUTEROL SULFATE HFA 108 (90 BASE) MCG/ACT IN AERS Inhalation Inhale 2 puffs into the lungs every 6 (six) hours as needed.      . ASPIRIN 81 MG PO TABS Oral Take 81 mg by mouth daily.      . B COMPLEX PO TABS Oral Take 1 tablet by mouth daily.      . BUDESONIDE-FORMOTEROL FUMARATE 160-4.5 MCG/ACT IN AERO Inhalation Inhale 2 puffs into the lungs 2 (two) times daily.      . BUPROPION HCL ER (XL) 150 MG PO TB24 Oral Take 1 tablet (150 mg total) by mouth  daily. 90 tablet 1  . HYDRALAZINE HCL 50 MG PO TABS Oral Take 1 tablet (50 mg total) by mouth 3 (three) times daily. 270 tablet 1  . HYDROCHLOROTHIAZIDE 25 MG PO TABS Oral Take 1 tablet (25 mg total) by mouth daily. 90 tablet 3  . LABETALOL HCL 300 MG PO TABS Oral Take 1 tablet (300 mg total) by mouth 3 (three) times daily. 270 tablet 1  . LISINOPRIL 40 MG PO TABS Oral Take 1 tablet (40 mg total) by mouth at bedtime. 90 tablet 1  . METHADONE HCL PO Oral Take 37 mg by mouth every morning. LIQIUD 37    . MULTIVITAMINS PO CAPS Oral Take 1 capsule by mouth daily.      Marland Kitchen FISH OIL BURP-LESS PO Oral Take 1 tablet by mouth daily.        BP 185/106  Pulse 84  Temp(Src) 98.5 F (36.9 C) (Oral)  Resp 16  SpO2 91%  Physical Exam  Nursing note and vitals reviewed. Constitutional: She is oriented to person, place, and time. She appears well-developed and well-nourished.  HENT:  Head: Normocephalic and atraumatic.  Nose: Mucosal edema and rhinorrhea present.  Mouth/Throat: Uvula is midline and oropharynx is clear and moist.  Eyes: Conjunctivae are normal. Right eye exhibits no discharge. Left eye exhibits no discharge.  Neck: Normal range of motion. Neck supple.  Cardiovascular: Normal rate, regular rhythm and normal heart sounds.   Pulmonary/Chest: Effort normal. She has wheezes (Inspiration and expiration) in the right upper field, the right middle field, the right lower field, the left upper field, the left middle field and the left lower field.  Abdominal: Soft. There is no tenderness.  Neurological: She is alert and oriented to person, place, and time.  Skin: Skin is warm and dry.  Psychiatric: She has a normal mood and affect.    ED Course  Procedures (including critical care time)  Labs Reviewed - No data to display No results found.   1. Wheezing     12:10 PM Patient seen and examined. Medications ordered.   Vital signs reviewed and are as follows: Filed Vitals:   05/25/11  1134  BP: 185/106  Pulse: 84  Temp:   Resp: 16  BP 185/106  Pulse 84  Temp(Src) 98.5 F (36.9 C) (Oral)  Resp 16  SpO2 91% on 2L [place].   12:47 PM Patient feels much improved after first breathing treatment. She continues to wheeze. O2 sat 94% on 2L. Additional neb ordered.   2:34 PM Patient continues to improve. Mild wheezing remains. Discussed with Dr. Audie Pinto. She is requesting discharge. Urged patient to use her home oxygen consistently. Urged PCP follow up in 3 days. Patient told to return with worsening shortness of breath or other symptoms. She verbalizes understanding and agrees with plan.  MDM  COPD/asthma exacerbation, patient is clinically improved in  the ED. She is requesting discharge home. Do not suspect any cardiac etiology or blood clot. Do not suspect PNA. Will continue burst of steroids.        Carlisle Cater, [place] 05/25/11 [phone]

## 2011-05-25 NOTE — ED Notes (Signed)
Pt received to [facility] with c/o cough, shortness of breath x 2 days. Pt has a hx of asthma and has been using her breathing treatment but not getting better. Pt is A/A/Ox4, skin is warm and dry, respiration is even and unlabored but with bilateral inspiratory and expiratory wheezing noted.

## 2011-05-25 NOTE — Discharge Instructions (Signed)
Please read and follow all provided instructions.  Your diagnoses today include:  1. Wheezing     Tests performed today include:  Vital signs. See below for your results today.   Medications prescribed:   Prednisone - steroid medication that decreases inflammation in your lungs  Please take the prednisone as prescribed for 5 days. It is best to take this medication in the morning to prevent sleeping problems. If you are diabetic, monitor your blood sugar closely and stop taking Prednisone if blood sugar is over 300. Take with food to prevent stomach upset.   Take any prescribed medications only as directed.  Home care instructions:  Follow any educational materials contained in this packet.  Follow-up instructions: Please follow-up with your primary care provider in the next 3 days for further evaluation of your symptoms and a recheck if you are not feeling better. This includes a recheck of your blood pressure which was high today. If you do not have a primary care doctor -- see below for referral information.   Return instructions:   Please return to the Emergency Department if you experience worsening symptoms.  Please return with worsening wheezing, shortness of breath, or difficulty breathing.  Return with persistent fever above 101F.   Please return if you have any other emergent concerns.  Additional Information:  Your vital signs today were: BP 179/94  Pulse 78  Temp(Src) 98.3 F (36.8 C) (Oral)  Resp 18  SpO2 93% If your blood pressure (BP) was elevated above 135/85 this visit, please have this repeated by your doctor within one month. -------------- No Primary Care Doctor Call [facility]  [phone] Other agencies that provide inexpensive medical care    [place]  [facility]  [place]  [phone]    [facility]  [phone]    [facility]  [facility]   [phone] -------------- RESOURCE GUIDE:  Dental Problems  Patients with Medicaid: [facility] [phone] [address] Phone:  [phone]                                                   Phone:  [phone]  If unable to pay or uninsured, contact:  [facility] or [facility]. to become qualified for the adult dental clinic.  Chronic Pain Problems Contact [facility]  [phone] Patients need to be referred by their primary care doctor.  Insufficient Money for Medicine Contact [employer]:  call "211" or [place] [phone].  [place]  [place]  [place]   [phone] (emergency services [phone])  Substance Abuse Resources [facility] and Drug Services  [phone] [facility] [phone] [facility] [place] [phone] Chinita Pester [phone] Residential & Outpatient Substance Abuse Program  [phone]  Abuse/Neglect [facility] Child Abuse  Hotline [phone] [place] [phone] (After Hours)  Emergency [place] [phone]  [place] at the [place] [phone] [place] [phone]  [facility] Dept. [address]      [place] Phone:  [phone]                                   Phone:  [phone]                 Phone:  [place] Phone:  [place] [phone] (After Hours)

## 2011-05-28 NOTE — ED Provider Notes (Signed)
Medical screening examination/treatment/procedure(s) were performed by non-physician practitioner and as supervising physician I was immediately available for consultation/collaboration.    Dot Lanes, MD 05/28/11 2027

## 2011-06-02 ENCOUNTER — Telehealth: Payer: Self-pay | Admitting: *Deleted

## 2011-06-02 NOTE — Telephone Encounter (Signed)
ATTEMPTED TO CALL PT RE  PHARMACY'S REQUEST FOR  SIMVASTATIN  40 MG UNABLE TO LEAVE MESSAGE ON HOME NUMBER AND MOBILE  NOT WORKING NUMBER AT THIS TIME  REVIEWED RECORDS IN EPIC  DOES NOT SHOW ANY  INDICATION PT TAKING MED .Adonis Housekeeper

## 2011-06-21 ENCOUNTER — Emergency Department (HOSPITAL_COMMUNITY)
Admission: EM | Admit: 2011-06-21 | Discharge: 2011-06-22 | Disposition: A | Discharge status: Home / Self Care | Payer: Medicare Other | Attending: Emergency Medicine | Admitting: Emergency Medicine

## 2011-06-21 ENCOUNTER — Emergency Department (HOSPITAL_COMMUNITY): Payer: Medicare Other

## 2011-06-21 ENCOUNTER — Encounter (HOSPITAL_COMMUNITY): Payer: Self-pay | Admitting: Emergency Medicine

## 2011-06-21 DIAGNOSIS — J40 Bronchitis, not specified as acute or chronic: Secondary | ICD-10-CM

## 2011-06-21 DIAGNOSIS — J45901 Unspecified asthma with (acute) exacerbation: Secondary | ICD-10-CM | POA: Insufficient documentation

## 2011-06-21 DIAGNOSIS — R51 Headache: Secondary | ICD-10-CM | POA: Insufficient documentation

## 2011-06-21 DIAGNOSIS — J449 Chronic obstructive pulmonary disease, unspecified: Secondary | ICD-10-CM | POA: Insufficient documentation

## 2011-06-21 DIAGNOSIS — R059 Cough, unspecified: Secondary | ICD-10-CM | POA: Insufficient documentation

## 2011-06-21 DIAGNOSIS — J3489 Other specified disorders of nose and nasal sinuses: Secondary | ICD-10-CM | POA: Insufficient documentation

## 2011-06-21 DIAGNOSIS — Z79899 Other long term (current) drug therapy: Secondary | ICD-10-CM | POA: Insufficient documentation

## 2011-06-21 DIAGNOSIS — I2510 Atherosclerotic heart disease of native coronary artery without angina pectoris: Secondary | ICD-10-CM | POA: Insufficient documentation

## 2011-06-21 DIAGNOSIS — I10 Essential (primary) hypertension: Secondary | ICD-10-CM | POA: Insufficient documentation

## 2011-06-21 DIAGNOSIS — R05 Cough: Secondary | ICD-10-CM | POA: Insufficient documentation

## 2011-06-21 DIAGNOSIS — E785 Hyperlipidemia, unspecified: Secondary | ICD-10-CM | POA: Insufficient documentation

## 2011-06-21 DIAGNOSIS — R0602 Shortness of breath: Secondary | ICD-10-CM | POA: Insufficient documentation

## 2011-06-21 DIAGNOSIS — J4489 Other specified chronic obstructive pulmonary disease: Secondary | ICD-10-CM | POA: Insufficient documentation

## 2011-06-21 LAB — POCT I-STAT, CHEM 8
Calcium, Ion: 1.21 mmol/L (ref 1.12–1.32)
HCT: 45 % (ref 36.0–46.0)
Hemoglobin: 15.3 g/dL — ABNORMAL HIGH (ref 12.0–15.0)
TCO2: 34 mmol/L (ref 0–100)

## 2011-06-21 LAB — CBC
Platelets: 292 10*3/uL (ref 150–400)
RDW: 13.8 % (ref 11.5–15.5)
WBC: 10.3 10*3/uL (ref 4.0–10.5)

## 2011-06-21 LAB — DIFFERENTIAL
Basophils Absolute: 0 10*3/uL (ref 0.0–0.1)
Eosinophils Relative: 3 % (ref 0–5)
Lymphocytes Relative: 24 % (ref 12–46)
Neutro Abs: 6.7 10*3/uL (ref 1.7–7.7)

## 2011-06-21 MED ORDER — ALBUTEROL (5 MG/ML) CONTINUOUS INHALATION SOLN: 10.0000 mg/h | INHALATION_SOLUTION | RESPIRATORY_TRACT | Status: AC

## 2011-06-21 MED ORDER — PREDNISONE 20 MG PO TABS: 40.0000 mg | ORAL_TABLET | Freq: Every day | ORAL | Status: AC

## 2011-06-21 MED ORDER — ALBUTEROL SULFATE (5 MG/ML) 0.5% IN NEBU
INHALATION_SOLUTION | RESPIRATORY_TRACT | Status: AC
  Administered 2011-06-21: 5 mg
  Filled 2011-06-21: qty 1

## 2011-06-21 MED ORDER — ALBUTEROL SULFATE (5 MG/ML) 0.5% IN NEBU
INHALATION_SOLUTION | RESPIRATORY_TRACT | Status: AC
  Administered 2011-06-21: 21:00:00
  Filled 2011-06-21: qty 2.5

## 2011-06-21 MED ORDER — AZITHROMYCIN 250 MG PO TABS: 250.0000 mg | ORAL_TABLET | Freq: Every day | ORAL | Status: AC

## 2011-06-21 MED ORDER — ALBUTEROL SULFATE (2.5 MG/3ML) 0.083% IN NEBU: 2.5000 mg | INHALATION_SOLUTION | Freq: Four times a day (QID) | RESPIRATORY_TRACT | Status: DC | PRN

## 2011-06-21 MED ORDER — ALBUTEROL SULFATE (5 MG/ML) 0.5% IN NEBU
5.0000 mg | INHALATION_SOLUTION | Freq: Once | RESPIRATORY_TRACT | Status: AC
  Administered 2011-06-21: 5 mg via RESPIRATORY_TRACT
  Filled 2011-06-21: qty 1

## 2011-06-21 MED ORDER — DEXAMETHASONE SODIUM PHOSPHATE 10 MG/ML IJ SOLN
10.0000 mg | Freq: Once | INTRAMUSCULAR | Status: AC
  Administered 2011-06-21: 10 mg via INTRAMUSCULAR
  Filled 2011-06-21: qty 1

## 2011-06-21 NOTE — ED Provider Notes (Signed)
History     CSN: [id number]  Arrival date & time 06/21/11  1642   First MD Initiated Contact with Patient 06/21/11 2035      Chief Complaint  Patient presents with  . Shortness of Breath    (Consider location/radiation/quality/duration/timing/severity/associated sxs/prior treatment) Patient is a 46 y.o. female presenting with shortness of breath. The history is provided by the patient.  Shortness of Breath  The current episode started 2 days ago. The problem occurs frequently. The problem has been gradually worsening. The problem is moderate. The symptoms are relieved by nothing. Associated symptoms include shortness of breath and wheezing. Pertinent negatives include no chest pain, no chest pressure, no fever, no rhinorrhea, no sore throat and no cough. She has had intermittent steroid use. She has had prior hospitalizations. Her past medical history is significant for asthma, bronchiolitis and past wheezing. Urine output has been normal. There were no sick contacts. Recently, medical care has been given by the PCP.   patient  here today with a 2 day history of increased wheezing and shortness of breath. States that she thinks her oxygen machine and nebulizer machine at home are not working properly. States that shortness of breath and wheezing gets worse when she is walking up steps or smoking of cigarettes. States she's had increased coughing and headaches and congestion for the last 3 days. Denies fever. Patient is on home O2 at 2 L nasal cannula. Initial O2 sat was 86%. Presently the O2 sat is 90% on 2 L nasal cannula. Patient has already received one breathing treatment and without hour long breathing treatment. He presently has minimal wheezing in the right lower lobe.  Past Medical History  Diagnosis Date  . HTN (hypertension)     Has normal renal arteries.  . Cleft palate   . NSTEMI (non-ST elevated myocardial infarction) 09-25-2010  . DYSPNEA ON EXERTION 03/08/2007  .  ELECTROCARDIOGRAM, ABNORMAL 02/13/2010  . CAD (coronary artery disease)     NSTEMI with cath in August 2012 with nonobstructive disease, 50% ostial D1 and 70% distal LCX and PL disease. Normal EF. Has diastolic dysfunction with elevated EDP  . Tobacco abuse disorder   . COPD (chronic obstructive pulmonary disease)   . Hyperlipidemia   . Asthma     Past Surgical History  Procedure Date  . Cesarean section   . Cardiac catheterization Aug 2012    Moderate nonobstructive multivessel CAD with an EF of 70 to 75%  . Coronary angioplasty with stent placement     Family History  Problem Relation Age of Onset  . Diabetes    . Hypertension    . Asthma Mother   . Asthma Brother   . Heart disease Mother     History  Substance Use Topics  . Smoking status: Current Everyday Smoker -- 0.5 packs/day for 31 years    Types: Cigarettes  . Smokeless tobacco: Not on file   Comment: 7-8 CIGS A DAY  . Alcohol Use: No    OB History    Grav Para Term Preterm Abortions TAB SAB Ect Mult Living            1      Review of Systems  Constitutional: Negative.  Negative for fever.  HENT: Negative for ear pain, sore throat and rhinorrhea.   Respiratory: Positive for shortness of breath and wheezing. Negative for cough.   Cardiovascular: Negative for chest pain.  Gastrointestinal: Negative.   Musculoskeletal: Negative.  Negative for back pain.  Skin: Negative.   Neurological: Positive for headaches. Negative for dizziness and weakness.  All other systems reviewed and are negative.    Allergies  Review of patient's allergies indicates no known allergies.  Home Medications   Current Outpatient Rx  Name Route Sig Dispense Refill  . ALBUTEROL SULFATE HFA 108 (90 BASE) MCG/ACT IN AERS Inhalation Inhale 2 puffs into the lungs every 6 (six) hours as needed. Shortness of breath    . B COMPLEX PO TABS Oral Take 1 tablet by mouth daily.      . BUDESONIDE-FORMOTEROL FUMARATE 160-4.5 MCG/ACT IN AERO  Inhalation Inhale 2 puffs into the lungs 2 (two) times daily.      . BUPROPION HCL ER (XL) 150 MG PO TB24 Oral Take 150 mg by mouth daily.    Marland Kitchen HYDRALAZINE HCL 50 MG PO TABS Oral Take 1 tablet (50 mg total) by mouth 3 (three) times daily. 270 tablet 1  . HYDROCHLOROTHIAZIDE 25 MG PO TABS Oral Take 1 tablet (25 mg total) by mouth daily. 90 tablet 3  . LABETALOL HCL 300 MG PO TABS Oral Take 1 tablet (300 mg total) by mouth 3 (three) times daily. 270 tablet 1  . LISINOPRIL 40 MG PO TABS Oral Take 1 tablet (40 mg total) by mouth at bedtime. 90 tablet 1  . METHADONE HCL PO Oral Take 35 mg by mouth every morning.     . MULTIVITAMINS PO CAPS Oral Take 1 capsule by mouth daily.      Marland Kitchen FISH OIL BURP-LESS PO Oral Take 1 tablet by mouth daily.       BP 177/85  Pulse 86  Temp 98.4 F (36.9 C)  Resp 24  SpO2 90%  LMP 06/07/2011  Physical Exam  Nursing note and vitals reviewed. Constitutional: She is oriented to person, place, and time. She appears well-developed and well-nourished.  HENT:  Head: Normocephalic and atraumatic.  Eyes: Conjunctivae and EOM are normal. Pupils are equal, round, and reactive to light.  Neck: Normal range of motion. Neck supple.  Cardiovascular: Normal rate.   Pulmonary/Chest: Effort normal. No respiratory distress. She has wheezes. She has no rales.  Abdominal: Soft.  Musculoskeletal: Normal range of motion. She exhibits no edema and no tenderness.  Neurological: She is alert and oriented to person, place, and time. She has normal reflexes.  Skin: Skin is warm and dry.  Psychiatric: She has a normal mood and affect.    ED Course  Procedures (including critical care time)  Labs Reviewed  POCT I-STAT, CHEM 8 - Abnormal; Notable for the following:    Potassium 3.3 (*)    Creatinine, Ser 1.30 (*)    Glucose, Bld 199 (*)    Hemoglobin 15.3 (*)    All other components within normal limits  CBC  DIFFERENTIAL  URINALYSIS, ROUTINE W REFLEX MICROSCOPIC   Dg Chest  2 View  06/21/2011  *RADIOLOGY REPORT*  Clinical Data: Short of breath and chest pain  CHEST - 2 VIEW  Comparison: 09/25/2010  Findings: Heart size is mildly enlarged.  Negative for heart failure.  Peribronchial thickening is present, with progression from the  prior study.  This may be due to bronchitis.  Negative for pneumonia or effusion.  No mass lesion.  IMPRESSION: Peribronchial thickening suggesting bronchitis.  Original Report Authenticated By: Truett Perna, M.D.     No diagnosis found.    MDM  11pm  patient feels better on 2 L nasal cannula with a minimum wheezing to the  left lower lobe. O2 sats 90%. Patient wants to go home. She has received one breathing treatments and one hour long breathing treatment with good relief. She also received Solu-Medrol and her IV. Will watch her longer to see if she improves. Dr. Tomi Bamberger in to see patient. 1200 Patient feels better and wants to go home.  Ambulated on O2 with sats 89% but rapidly recovered to 93% at rest.  On 2L [place] at home as needed.  Will wear o2 tonight and will call for appointment with her pulmonary specialist tomorrow.  z-pack, prednisone and albuterol neb rx.  Chest x-ray shows bronchitis.  No wheezing presently. Quit smoking. Ready for discharge.   Labs Reviewed  POCT I-STAT, CHEM 8 - Abnormal; Notable for the following:    Potassium 3.3 (*)    Creatinine, Ser 1.30 (*)    Glucose, Bld 199 (*)    Hemoglobin 15.3 (*)    All other components within normal limits  CBC  DIFFERENTIAL  LAB REPORT - SCANNED          Julieta Bellini, NP 06/22/11 1608

## 2011-06-21 NOTE — ED Provider Notes (Signed)
Medical screening examination/treatment/procedure(s) were conducted as a shared visit with non-physician practitioner(s) and myself.  I personally evaluated the patient during the encounter  Pt feeling better after neb treatments.  Low 90s oxygen sat but she has meds at home.  NO wheezing noted on my exam.  Pt would like to go home.  Will try outpatient treatment with close follow up.  Kathalene Frames, MD 06/21/11 [phone]

## 2011-06-21 NOTE — ED Notes (Signed)
The pt does not appear to be in any distress

## 2011-06-21 NOTE — Discharge Instructions (Signed)
Karla Nelson your oxygen saturations were 84 in the beginning of the night and 93 after the treatments. Wear your oxygen all night tonight. I will refill your albuterol treatments and gave you a prescription for prednisone to take. Return to the ER for severe shortness of breath or chest pain. The chest x-ray shows no pneumonia. The lab work was unremarkable. Try not to smoke any cigarettes tonight. Start the prednisone in the morning.

## 2011-06-21 NOTE — ED Notes (Addendum)
Pt o2 was 89% while ambulating Resting o2 is 93%

## 2011-06-21 NOTE — ED Notes (Signed)
Reports hx of COPD, and last few days spo2 running low, and knew d/t having swelling in eyes- did not check sats at home; use nebulizer at home a few hours ago with no help; pt reports having cough, headaches,  & congestion for past 3 days; denies fevers, chills; home 02 at 2L

## 2011-06-21 NOTE — ED Notes (Signed)
resp therapy  Not available.  hhn 5.o started until resp therapy available.  See override

## 2011-06-21 NOTE — ED Notes (Signed)
The pt   Is c/o sob she has copd and today her bp has been high also.  Her 02 sats at home were low.  Mi in august with stents placed .  She has a hoarseness that she says is due to a cleft palate and the sound is normal;

## 2011-06-23 NOTE — ED Provider Notes (Signed)
Medical screening examination/treatment/procedure(s) were performed by non-physician practitioner and as supervising physician I was immediately available for consultation/collaboration.    Kathalene Frames, MD 06/23/11 [phone]

## 2011-06-25 ENCOUNTER — Telehealth: Payer: Self-pay | Admitting: Cardiovascular Disease

## 2011-06-25 NOTE — Telephone Encounter (Signed)
New Problem:    I called the home phone number listed for the patient and received an automated message that the voicemail box associated with that number was unable to accept messages at that time.  I called the mobile number listed for the patient and received an automated message that the patient was unavailable and to call back by at another time.

## 2011-07-06 ENCOUNTER — Other Ambulatory Visit: Payer: Self-pay | Admitting: Cardiovascular Disease

## 2011-07-12 ENCOUNTER — Encounter: Payer: Self-pay | Admitting: Pulmonary Disease

## 2011-07-12 ENCOUNTER — Ambulatory Visit (INDEPENDENT_AMBULATORY_CARE_PROVIDER_SITE_OTHER): Payer: Medicare Other | Admitting: Pulmonary Disease

## 2011-07-12 VITALS — BP 126/78 | HR 84 | Temp 98.3°F | Ht 65.0 in | Wt 170.2 lb

## 2011-07-12 DIAGNOSIS — J449 Chronic obstructive pulmonary disease, unspecified: Secondary | ICD-10-CM | POA: Insufficient documentation

## 2011-07-12 DIAGNOSIS — R05 Cough: Secondary | ICD-10-CM

## 2011-07-12 NOTE — Assessment & Plan Note (Signed)
The patient has moderate airflow obstruction on her spirometry today, and it is unclear how much of this would improve with total smoking cessation.  I have asked her to continue on her symbicort, and stressed the importance of smoking cessation.  Regarding her upcoming surgery, I think she is at moderate risk for postop pulmonary complications.  I have reminded her that she can greatly reduce her risk by stopping smoking from this point going forward.  I have also stressed to her the importance of wearing her oxygen with exertion compliantly, and at night while sleeping.

## 2011-07-12 NOTE — Progress Notes (Signed)
  Subjective:    Patient ID: Karla Nelson, female    DOB: 05/20/1965, 46 y.o.   MRN: [id number]  HPI The patient comes in today for followup of her history of dyspnea on exertion.  At the last visit, I scheduled her for full pulmonary function studies with an office visit the same day to review.  Unfortunately, she did not come to either, and has not been seen since that time.  She has had multiple admissions to the hospital for COPD exacerbation, and unfortunately continues to smoke.  She states that she is taking symbicort compliantly.  She apparently is scheduled for upcoming tooth extraction with sedation at the hospital, and needs pulmonary clearance.  She feels that her breathing is at baseline, but continues to have dyspnea on exertion.  She has a cough with occasional mucus production, but does not feel that she has a chest cold at this time.  She is supposed to be wearing oxygen at h.s. And also with exertion, but comes in today without it.  She dropped her sats below 90% just walking from the waiting room.   Review of Systems  Constitutional: Negative.  Negative for fever and unexpected weight change.  HENT: Positive for congestion. Negative for ear pain, nosebleeds, sore throat, rhinorrhea, sneezing, trouble swallowing, dental problem, postnasal drip and sinus pressure.   Eyes: Negative.  Negative for redness and itching.  Respiratory: Positive for shortness of breath, wheezing and stridor. Negative for cough and chest tightness.   Cardiovascular: Negative.  Negative for palpitations and leg swelling.  Gastrointestinal: Negative.  Negative for nausea and vomiting.  Genitourinary: Negative.  Negative for dysuria.  Musculoskeletal: Negative.  Negative for joint swelling.  Skin: Negative.  Negative for rash.  Neurological: Negative.  Negative for headaches.  Hematological: Negative.  Does not bruise/bleed easily.  Psychiatric/Behavioral: Negative for dysphoric mood. The patient is  nervous/anxious.        Objective:   Physical Exam Obese female in no acute distress Nose without purulence or discharge noted Chest with scattered rhonchi and a few wheezes, but adequate air flow. Cardiac exam with a regular rate and rhythm Lower extremities without edema, no cyanosis Alert and oriented, moves all 4 extremities.       Assessment & Plan:

## 2011-07-12 NOTE — Patient Instructions (Addendum)
You must stop smoking if you want to keep your lung disease from getting worse, and also if you want to limit your possible complications associated with your upcoming tooth extraction.   Stay on symbicort am and pm everyday.  Keep mouth rinsed out.  Stay on oxygen with exertion outside of your home, and also wear at night while sleeping. followup with me in 14mos.

## 2011-07-18 ENCOUNTER — Other Ambulatory Visit: Payer: Self-pay | Admitting: Cardiovascular Disease

## 2011-09-09 ENCOUNTER — Telehealth: Payer: Self-pay | Admitting: Pulmonary Disease

## 2011-09-09 NOTE — Telephone Encounter (Signed)
MR, is this okay with you? Please advise thanks!

## 2011-09-09 NOTE — Telephone Encounter (Signed)
[place], is this okay with you? Please advise, thanks!

## 2011-09-09 NOTE — Telephone Encounter (Signed)
That would please me very much

## 2011-09-13 NOTE — Telephone Encounter (Signed)
OV set for 10/28/11. Pt is aware.  Alma Bing, CMA

## 2011-09-13 NOTE — Telephone Encounter (Signed)
Is fine with me; give ffirst avail, 30 min slot though followup.

## 2011-10-01 ENCOUNTER — Telehealth: Payer: Self-pay | Admitting: Internal Medicine

## 2011-10-01 NOTE — Telephone Encounter (Signed)
Called and spoke with trish and Dr. Ishmael Holter and they were requesting that the pt be seen sooner that her appt on 9-19 with MR.   Stated that the pt is having a difficult time with her breathing and has been seen x 3  THIS week by Dr. Ishmael Holter.   Dr. Ishmael Holter stated that the pts sats today were in the 80;s and is requesting recs from MR.  Please advise. thanks

## 2011-10-01 NOTE — Telephone Encounter (Signed)
LMOM for DR Ishmael Holter to call back

## 2011-10-01 NOTE — Telephone Encounter (Signed)
I have spoe with Dr Ishmael Holter and she states fine for pt to see TP next wk. I have scheduled her for Monday 10-04-11.  Dr. Ishmael Holter would like to speak with TP today if possible regarding the pt before she sees her. Her cell is [phone].  Will forward to TP to call Dr. Ishmael Holter

## 2011-10-01 NOTE — Telephone Encounter (Signed)
See what is avail on slot before 10/28/11. Otherwise for those low sats today if feeling bad go to ER or next week see NP or Dr Carolin Guernsey or another MD as acute

## 2011-10-04 ENCOUNTER — Ambulatory Visit: Payer: Medicare Other | Admitting: Adult Health

## 2011-10-04 ENCOUNTER — Encounter: Payer: Self-pay | Admitting: Adult Health

## 2011-10-04 ENCOUNTER — Ambulatory Visit (INDEPENDENT_AMBULATORY_CARE_PROVIDER_SITE_OTHER): Payer: Medicare Other | Admitting: Adult Health

## 2011-10-04 VITALS — BP 160/90 | HR 77 | Temp 97.3°F | Ht 65.0 in | Wt 173.8 lb

## 2011-10-04 DIAGNOSIS — I10 Essential (primary) hypertension: Secondary | ICD-10-CM

## 2011-10-04 DIAGNOSIS — J449 Chronic obstructive pulmonary disease, unspecified: Secondary | ICD-10-CM

## 2011-10-04 MED ORDER — OLMESARTAN MEDOXOMIL 40 MG PO TABS: 40.0000 mg | ORAL_TABLET | Freq: Every day | ORAL | Status: DC

## 2011-10-04 NOTE — Assessment & Plan Note (Addendum)
Recurrent COPD exacerbations with suspected asthma component  However i have explained to pt that smoking cessation is most important goal  Would cont on wellbutrin for now, hold on chantix as she has hx of CAD.   Suspect her med regimen with very high dose nonselective BB and ACE inhibitor are contributing to her  Dyspnea /wheezing and recurrent cough.   Will exchange ACE for ARB  Will refer back to PCP regarding her BB as it would be better if bisoprolol or bystolic could be used I wonder that med compliance may be an issue for her as well.   Plan Continue on Spiriva 1 puff daily  Continue on Dulera 2 puffs Twice daily   Brush/rinse and gargle after inhaler use.  STOP LISINOPRIL  Begin Benicar 40mg  daily (in place of lisinopril )  Stop smoking is most important goal.  follow up Dr. [name] in 3 weeks or Daniela Siebers NP  Please contact office for sooner follow up if symptoms do not improve or worsen or seek emergency care

## 2011-10-04 NOTE — Progress Notes (Signed)
Subjective:    Patient ID: Karla Nelson, female    DOB: Apr 15, 1965, 46 y.o.   MRN: [id number]  HPI 46 yo AAF with known hx of COPD ? Asthma component , active smoker.  [name] 07/2011: FEV1 1.45 (59%), ratio 61  10/04/2011 Acute OV  Returns for persistent cough and wheezing .  Was seen at her asthma and allergist office-Dr. Ishmael Holter- last week with cough and wheezing long with  sob with exertion for  7days . Was started on Steroid taper. Feels this is helping but believes symptoms will come right back as she finishes the steroids (as this has been her past experiences)   Has cough with min productive w/-  thick clear mucus along with nasal drip .  Dr. Therese Sarah called our office on 10/01/11 and spoke with myself regarding her AB flare .  Recent pre/post spirometry in Dr. Ishmael Holter office shows some reversibility post SABA.  She is now on [place] .   Of note, MAR shows Labetalol 300mg  Three times a day  , Lisinopril daily  Along with active smoker.  We discussed in detail smoking cessation .  Over last 6-9 months has had 4 ER visits for asthma flares along with multiple OV for uncontrolled asthma Uses her rescue inhaler several times a day B/p is very elevated today -pt says she has not taken her meds of yet.  No headache/visual changes or edema   Last cxr ~06/2011 was w/ no acute process   Pt will be changing pulmonary docs -from Dr. [name] to Dr. [name]  Have discussed case with Dr. [name]    Review of Systems Constitutional:   No  weight loss, night sweats,  Fevers, chills, fatigue, or  lassitude.  HEENT:   No headaches,  Difficulty swallowing,  Tooth/dental problems, or  Sore throat,                No sneezing, itching, ear ache, nasal congestion, post nasal drip,   CV:  No chest pain,  Orthopnea, PND, swelling in lower extremities, anasarca, dizziness, palpitations, syncope.   GI  No heartburn, indigestion, abdominal pain, nausea, vomiting, diarrhea, change in bowel  habits, loss of appetite, bloody stools.   Resp: No shortness of breath with exertion or at rest.  No excess mucus, no productive cough,  No non-productive cough,  No coughing up of blood.  No change in color of mucus.  No wheezing.  No chest wall deformity  Skin: no rash or lesions.  GU: no dysuria, change in color of urine, no urgency or frequency.  No flank pain, no hematuria   MS:  No joint pain or swelling.  No decreased range of motion.  No back pain.  Psych:  No change in mood or affect. No depression or anxiety.  No memory loss.         Objective:   Physical Exam GEN: A/Ox3; pleasant , NAD, well nourished   HEENT:  [place]/AT,  EACs-clear, TMs-wnl, NOSE-clear, THROAT-clear, no lesions, no postnasal drip or exudate noted.   NECK:  Supple w/ fair ROM; no JVD; normal carotid impulses w/o bruits; no thyromegaly or nodules palpated; no lymphadenopathy.  RESP  Clear  P & A; w/o, wheezes/ rales/ or rhonchi.no accessory muscle use, no dullness to percussion  CARD:  RRR, no m/r/g  , no peripheral edema, pulses intact, no cyanosis or clubbing.  GI:   Soft & nt; nml bowel sounds; no organomegaly or masses detected.  Musco: Warm bil, no  deformities or joint swelling noted.   Neuro: alert, no focal deficits noted.    Skin: Warm, no lesions or rashes         Assessment & Plan:

## 2011-10-04 NOTE — Assessment & Plan Note (Signed)
Uncontrolled  Encouraged on med compliance  Stop ace , begin benicar -samples given until return in 3 week s follow up PCP regarding b/p

## 2011-10-04 NOTE — Patient Instructions (Addendum)
Continue on Spiriva 1 puff daily  Continue on Dulera 2 puffs Twice daily   Brush/rinse and gargle after inhaler use.  STOP LISINOPRIL  Begin Benicar 40mg  daily (in place of lisinopril )  Stop smoking is most important goal.  follow up Dr. [name] in 3 weeks or Amayra Kiedrowski NP  Please contact office for sooner follow up if symptoms do not improve or worsen or seek emergency care   Follow up Dr. Arnoldo Morale regarding you elevated blood pressure

## 2011-10-25 ENCOUNTER — Ambulatory Visit: Payer: Medicare Other | Admitting: Adult Health

## 2011-10-28 ENCOUNTER — Ambulatory Visit: Payer: Medicare Other | Admitting: Internal Medicine

## 2011-11-01 ENCOUNTER — Emergency Department (INDEPENDENT_AMBULATORY_CARE_PROVIDER_SITE_OTHER)
Admission: EM | Admit: 2011-11-01 | Discharge: 2011-11-01 | Disposition: A | Discharge status: Home / Self Care | Payer: Medicare Other | Source: Home / Self Care | Attending: Family Medicine | Admitting: Family Medicine

## 2011-11-01 ENCOUNTER — Encounter (HOSPITAL_COMMUNITY): Payer: Self-pay

## 2011-11-01 ENCOUNTER — Ambulatory Visit: Payer: Medicare Other | Admitting: Adult Health

## 2011-11-01 DIAGNOSIS — J441 Chronic obstructive pulmonary disease with (acute) exacerbation: Secondary | ICD-10-CM

## 2011-11-01 MED ORDER — ALBUTEROL SULFATE (5 MG/ML) 0.5% IN NEBU
INHALATION_SOLUTION | RESPIRATORY_TRACT | Status: AC
  Filled 2011-11-01: qty 1

## 2011-11-01 MED ORDER — ALBUTEROL SULFATE (5 MG/ML) 0.5% IN NEBU
2.5000 mg | INHALATION_SOLUTION | Freq: Once | RESPIRATORY_TRACT | Status: AC
  Administered 2011-11-01: 2.5 mg via RESPIRATORY_TRACT

## 2011-11-01 MED ORDER — IPRATROPIUM BROMIDE 0.02 % IN SOLN
0.5000 mg | Freq: Once | RESPIRATORY_TRACT | Status: AC
  Administered 2011-11-01: 0.5 mg via RESPIRATORY_TRACT

## 2011-11-01 MED ORDER — METHYLPREDNISOLONE SODIUM SUCC 125 MG IJ SOLR
125.0000 mg | Freq: Once | INTRAMUSCULAR | Status: AC
  Administered 2011-11-01: 125 mg via INTRAMUSCULAR

## 2011-11-01 MED ORDER — METHYLPREDNISOLONE SODIUM SUCC 125 MG IJ SOLR
INTRAMUSCULAR | Status: AC
  Filled 2011-11-01: qty 2

## 2011-11-01 NOTE — ED Provider Notes (Signed)
History     CSN: [id number]  Arrival date & time 11/01/11  1206   First MD Initiated Contact with Patient 11/01/11 1221      Chief Complaint  Patient presents with  . Shortness of Breath    (Consider location/radiation/quality/duration/timing/severity/associated sxs/prior treatment) HPI Comments: 46 year old female with complex medical history including coronary artery disease and COPD on home oxygen 2 L at nighttime. Comes to urgent care with generalized wheezing, shortness of breath and cough with clear sputum. Oxygen saturation on room air was 86%. Patient states her baseline is between 85% and 95%. Patient states she has been wheezing intermittently for the last week worsening since last night. She was recently started on Spiriva and Dulera due to recurrent COPD exacerbations during the last month and states has used her albuterol inhaler about 5 times yesterday and has not used it today. Patient states she has nasal congestion triggering her asthma symptoms; states that she does not have the nasal septum and she has seasonal allergies during the fall season which triggers her asthma symptoms.       Past Medical History  Diagnosis Date  . HTN (hypertension)     Has normal renal arteries.  . Cleft palate   . NSTEMI (non-ST elevated myocardial infarction) 09-25-2010  . DYSPNEA ON EXERTION 03/08/2007  . ELECTROCARDIOGRAM, ABNORMAL 02/13/2010  . CAD (coronary artery disease)     NSTEMI with cath in August 2012 with nonobstructive disease, 50% ostial D1 and 70% distal LCX and PL disease. Normal EF. Has diastolic dysfunction with elevated EDP  . Tobacco abuse disorder   . COPD (chronic obstructive pulmonary disease)   . Hyperlipidemia   . Asthma     Past Surgical History  Procedure Date  . Cesarean section   . Cardiac catheterization Aug 2012    Moderate nonobstructive multivessel CAD with an EF of 70 to 75%  . Coronary angioplasty with stent placement     Family History    Problem Relation Age of Onset  . Diabetes    . Hypertension    . Asthma Mother   . Asthma Brother   . Heart disease Mother     History  Substance Use Topics  . Smoking status: Current Every Day Smoker -- 0.5 packs/day for 31 years    Types: Cigarettes  . Smokeless tobacco: Not on file   Comment: 7-8 CIGS A DAY  . Alcohol Use: No    OB History    Grav Para Term Preterm Abortions TAB SAB Ect Mult Living            1      Review of Systems  Constitutional: Negative for fever and chills.  Respiratory: Positive for cough, shortness of breath and wheezing.   Cardiovascular: Negative for chest pain.  Neurological: Negative for dizziness and headaches.  All other systems reviewed and are negative.    Allergies  Review of patient's allergies indicates no known allergies.  Home Medications   Current Outpatient Rx  Name Route Sig Dispense Refill  . ALBUTEROL SULFATE HFA 108 (90 BASE) MCG/ACT IN AERS Inhalation Inhale 2 puffs into the lungs every 6 (six) hours as needed. Shortness of breath    . ALBUTEROL SULFATE (2.5 MG/3ML) 0.083% IN NEBU Nebulization Take 3 mLs (2.5 mg total) by nebulization every 6 (six) hours as needed for wheezing. 75 mL 12  . B COMPLEX PO TABS Oral Take 1 tablet by mouth daily.      . BUPROPION HCL ER (  XL) 150 MG PO TB24 Oral Take 150 mg by mouth daily.    . DULERA 200-5 MCG/ACT IN AERO  2 puffs 2 (two) times daily.     Marland Kitchen HYDRALAZINE HCL 50 MG PO TABS Oral Take 1 tablet (50 mg total) by mouth 3 (three) times daily. 270 tablet 1  . HYDROCHLOROTHIAZIDE 25 MG PO TABS Oral Take 50 mg by mouth daily.    Marland Kitchen LABETALOL HCL 300 MG PO TABS Oral Take 1 tablet (300 mg total) by mouth 3 (three) times daily. 270 tablet 1  . METHADONE HCL PO Oral Take 33 mg by mouth every morning.     . MULTIVITAMINS PO CAPS Oral Take 1 capsule by mouth daily.      Marland Kitchen NITROSTAT 0.4 MG SL SUBL  PLACE 1 TABLET UNDER TONGUE EVERY 5 MINS AS NEEDED MAY REPEAT 3 TIMES 25 tablet 3  .  OLMESARTAN MEDOXOMIL 40 MG PO TABS Oral Take 1 tablet (40 mg total) by mouth daily.    Marland Kitchen FISH OIL BURP-LESS PO Oral Take 1 tablet by mouth daily.     Marland Kitchen TIOTROPIUM BROMIDE MONOHYDRATE 18 MCG IN CAPS Inhalation Place 18 mcg into inhaler and inhale daily.      BP 172/69  Pulse 70  Temp 98.4 F (36.9 C) (Oral)  Resp 24  SpO2 87%  Physical Exam  Nursing note and vitals reviewed. Constitutional: She is oriented to person, place, and time. She appears well-developed and well-nourished. No distress.  HENT:  Mouth/Throat: Oropharynx is clear and moist. No oropharyngeal exudate.  Eyes: Conjunctivae normal and EOM are normal. Pupils are equal, round, and reactive to light.  Neck: Neck supple. No thyromegaly present.  Cardiovascular: Normal rate, regular rhythm and normal heart sounds.   Pulmonary/Chest:       Prolonged expiration and generalized wheezing bilaterally. No orthopnea. No tachypnea. No retractions.  Abdominal: Soft. There is no tenderness.  Lymphadenopathy:    She has no cervical adenopathy.  Neurological: She is alert and oriented to person, place, and time.  Skin: No rash noted. She is not diaphoretic.       No distal cyanosis    ED Course  Procedures (including critical care time)  Labs Reviewed - No data to display No results found.   1. COPD exacerbation       MDM  46 year old female with complex medical history including coronary artery disease and COPD on home oxygen 2 L at nighttime. Came to urgent care with generalized wheezing and shortness of breath. No chest pain. No fever. Oxygen saturation on room air was 86%. Patient states her baseline is between 85 and 95%. She had administered IM Solu-Medrol 125 mg x1 and albuterol 5 mg/ipratropium 5 mg nebulizations x2. Patient feeling better with improved lung examination but with persistent wheezing and still O2 sat between 84% and 86% on room air. Patient will be transferred via EMS to: Emergency department for  further evaluation and management.  Of note I was told by nurse that patient decided to sign document for leaving [place]. Stating she was feeling better and has a school and home issues to resolve and does not want to get in the hospital. I had personally explained to patient that I was going to transfer her as she was still wheezing and her Oxygen level was still low despite treatment provided at [facility] and that there was involved risk of death if declining transfer or further evaluation and management at the hospital.  Randa Spike, MD 11/03/11 [phone]

## 2011-11-01 NOTE — ED Notes (Signed)
C/o asthma attack, SOB , has asthma and symptoms worsening over the weekend

## 2011-11-04 ENCOUNTER — Ambulatory Visit (INDEPENDENT_AMBULATORY_CARE_PROVIDER_SITE_OTHER): Payer: Medicare Other | Admitting: Adult Health

## 2011-11-04 ENCOUNTER — Encounter: Payer: Self-pay | Admitting: Adult Health

## 2011-11-04 VITALS — HR 80 | Temp 97.5°F | Ht 65.0 in | Wt 174.4 lb

## 2011-11-04 DIAGNOSIS — Z23 Encounter for immunization: Secondary | ICD-10-CM

## 2011-11-04 DIAGNOSIS — I10 Essential (primary) hypertension: Secondary | ICD-10-CM

## 2011-11-04 DIAGNOSIS — J449 Chronic obstructive pulmonary disease, unspecified: Secondary | ICD-10-CM

## 2011-11-04 DIAGNOSIS — Q359 Cleft palate, unspecified: Secondary | ICD-10-CM

## 2011-11-04 MED ORDER — TIOTROPIUM BROMIDE MONOHYDRATE 18 MCG IN CAPS: 18.0000 ug | ORAL_CAPSULE | Freq: Every day | RESPIRATORY_TRACT | Status: DC

## 2011-11-04 MED ORDER — MOMETASONE FURO-FORMOTEROL FUM 200-5 MCG/ACT IN AERO: 2.0000 | INHALATION_SPRAY | Freq: Two times a day (BID) | RESPIRATORY_TRACT | Status: DC

## 2011-11-04 MED ORDER — LOSARTAN POTASSIUM 100 MG PO TABS: 100.0000 mg | ORAL_TABLET | Freq: Every day | ORAL | Status: DC

## 2011-11-04 NOTE — Progress Notes (Signed)
Subjective:    Patient ID: Karla Nelson, female    DOB: 29-Aug-1965, 46 y.o.   MRN: [id number]  HPI  46 yo AAF with known hx of COPD ? Asthma component , active smoker.  [name] 07/2011: FEV1 1.45 (59%), ratio 61 Has O2 at home -wears As needed  And At bedtime    10/04/2011 Acute OV  Returns for persistent cough and wheezing .  Was seen at her asthma and allergist office-Dr. Ishmael Holter- last week with cough and wheezing long with  sob with exertion for  7days . Was started on Steroid taper. Feels this is helping but believes symptoms will come right back as she finishes the steroids (as this has been her past experiences)   Has cough with min productive w/-  thick clear mucus along with nasal drip .  Dr. Ishmael Holter called our office on 10/01/11 and spoke with myself regarding her AB flare .  Recent pre/post spirometry in Dr. Ishmael Holter office shows some reversibility post SABA.  She is now on [place] .   Of note, MAR shows Labetalol 300mg  Three times a day  , Lisinopril daily  Along with active smoker.  We discussed in detail smoking cessation .  Over last 6-9 months has had 4 ER visits for asthma flares along with multiple OV for uncontrolled asthma Uses her rescue inhaler several times a day B/p is very elevated today -pt says she has not taken her meds of yet.  No headache/visual changes or edema   Last cxr ~06/2011 was w/ no acute process   Pt will be changing pulmonary docs -from Dr. [name] to Dr. [name]  Have discussed case with Dr. [name]  >>cont spiriva and [name], stop lisinopril  >benicar    11/04/2011 Follow up  Returns for follow up of COPD /Asthma  Last ov lisinopril stopped and benicar added  She is feeling better but had 1 attack last week w/ ED visit. Tx w/ solumedrol and left AMA .  Felt better w/ decreased wheezing . Had low O2 sat at 86% , Has o2 at home - wears it As needed  And At bedtime   Ran out of spiriva 1 week ago.  Reminded her that her insurance  Medicaid will cover her inhaler as she was depending on samples for these.  She has cut back on smoking down 4 cigs daily .  We discussed her high dose Labetolol. She has had difficult to control b/p and is on 900mg  of Labetolol daily .  She has agreed to make ov with PCP to discuss changing this.  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 No edema.    Review of Systems  Constitutional:   No  weight loss, night sweats,  Fevers, chills, fatigue, or  lassitude.  HEENT:   No headaches,  Difficulty swallowing,  Tooth/dental problems, or  Sore throat,                No sneezing, itching, ear ache,  +nasal congestion, post nasal drip,   CV:  No chest pain,  Orthopnea, PND, swelling in lower extremities, anasarca, dizziness, palpitations, syncope.   GI  No heartburn, indigestion, abdominal pain, nausea, vomiting, diarrhea, change in bowel habits, loss of appetite, bloody stools.   Resp:     No chest wall deformity  Skin: no rash or lesions.  GU: no dysuria, change in color of urine, no  urgency or frequency.  No flank pain, no hematuria   MS:  No joint pain or swelling.  No decreased range of motion.  No back pain.  Psych:  No change in mood or affect. No depression or anxiety.  No memory loss.         Objective:   Physical Exam  GEN: A/Ox3; pleasant , NAD, well nourished   HEENT:  [place]/AT,  EACs-clear, TMs-wnl, NOSE-clear, THROAT-clear, no lesions, no postnasal drip or exudate noted.  Edentulous upper (dentures)  Upper palate open midline   NECK:  Supple w/ fair ROM; no JVD; normal carotid impulses w/o bruits; no thyromegaly or nodules palpated; no lymphadenopathy.  RESP  Coarse BS  w/o, wheezes/ rales/ or rhonchi.no accessory muscle use, no dullness to percussion  CARD:  RRR, no m/r/g  , no peripheral edema, pulses intact, no cyanosis or clubbing.  GI:   Soft & nt; nml bowel sounds; no organomegaly or  masses detected.  Musco: Warm bil, no deformities or joint swelling noted.   Neuro: alert, no focal deficits noted.    Skin: Warm, no lesions or rashes         Assessment & Plan:

## 2011-11-04 NOTE — Addendum Note (Signed)
Addended by: Parke Poisson E on: 11/04/2011 04:23 PM   Modules accepted: Orders

## 2011-11-04 NOTE — Assessment & Plan Note (Signed)
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  Has upper dentures  Would like her to establish with local ENT for any additional suggestions /tx options.

## 2011-11-04 NOTE — Assessment & Plan Note (Signed)
Needs ov with PCP for b/p issues  Would avoid ACE inhibitors in future If at all possible consider changing off Labetolol to more selective BB  Will defer to PCP

## 2011-11-04 NOTE — Patient Instructions (Addendum)
Flu Shot today  Restart  Spiriva 1 puff daily  Continue on Dulera 2 puffs Twice daily   Brush/rinse and gargle after inhaler use.  Remain off  LISINOPRIL  Change Benicar to Losartan 100mg  daily (this should be covered on your insurance)  Stop smoking is most important goal.  follow up Dr. [name] in 4 weeks  We are referring you to ENT for open cleft palate.  Please contact office for sooner follow up if symptoms do not improve or worsen or seek emergency care   Set up visit with Dr. Arnoldo Morale regarding you elevated blood pressure   And changing Labetolol.

## 2011-11-04 NOTE — Assessment & Plan Note (Signed)
Recurrent flare in active smoking  CXR 06/2011 w/no acute process Advised on smoking cessation Cont on dulera, restart spiriva- refills sent to pharm  Remain off ACE  Would like if changed off Labetolol as she has an asthma component. May benefit on more selective BB  Also refer to ENT as open cleft palate may be contributing to possible recurrent infection ???

## 2011-11-05 ENCOUNTER — Telehealth: Payer: Self-pay | Admitting: Adult Health

## 2011-11-05 NOTE — Telephone Encounter (Signed)
I called pt NA unable to lave VM bc it has not been activated yet. WCB

## 2011-11-05 NOTE — Telephone Encounter (Signed)
When we reach patient, need to inform her that d/t her type of insurance, her PCP will have to refer her to ENT - they will not accept referral from a specialty office.  Patient Instructions     Flu Shot today  Restart Spiriva 1 puff daily  Continue on Dulera 2 puffs Twice daily  Brush/rinse and gargle after inhaler use.  Remain off LISINOPRIL  Change Benicar to Losartan 100mg  daily (this should be covered on your insurance)  Stop smoking is most important goal.  follow up Dr. [name] in 4 weeks  We are referring you to ENT for open cleft palate.  Please contact office for sooner follow up if symptoms do not improve or worsen or seek emergency care  Set up visit with Dr. Arnoldo Morale regarding you elevated blood pressure And changing Labetolol.     Will route to my inbasket as well.

## 2011-11-08 NOTE — Telephone Encounter (Signed)
ATC at # provided above - received msg that "not able to accept msgs at this time.  Pls try your call again later.  Goodbye."  wcb

## 2011-11-09 NOTE — Telephone Encounter (Signed)
Spoke with the pt. She states that she picked up rx for losartan and wanted to make sure that this is correct and she should not take lisinopril. I advised that the losartan is correct, and should take this and NOT lisinopril. She verbalized understanding.  I advised the pt that she needs to get PCP to refer her to ENT due to her ins and she verbalized understanding.

## 2011-12-02 ENCOUNTER — Encounter: Payer: Self-pay | Admitting: Internal Medicine

## 2011-12-02 ENCOUNTER — Ambulatory Visit (INDEPENDENT_AMBULATORY_CARE_PROVIDER_SITE_OTHER): Payer: Medicare Other | Admitting: Internal Medicine

## 2011-12-02 VITALS — BP 158/102 | HR 105 | Temp 97.8°F | Ht 65.0 in | Wt 174.0 lb

## 2011-12-02 DIAGNOSIS — Z720 Tobacco use: Secondary | ICD-10-CM

## 2011-12-02 DIAGNOSIS — F17200 Nicotine dependence, unspecified, uncomplicated: Secondary | ICD-10-CM

## 2011-12-02 DIAGNOSIS — J449 Chronic obstructive pulmonary disease, unspecified: Secondary | ICD-10-CM

## 2011-12-02 DIAGNOSIS — J441 Chronic obstructive pulmonary disease with (acute) exacerbation: Secondary | ICD-10-CM

## 2011-12-02 MED ORDER — DOXYCYCLINE HYCLATE 100 MG PO TABS: 100.0000 mg | ORAL_TABLET | Freq: Two times a day (BID) | ORAL | Status: DC

## 2011-12-02 MED ORDER — PREDNISONE 10 MG PO TABS: ORAL_TABLET | ORAL | Status: DC

## 2011-12-02 NOTE — Progress Notes (Signed)
Subjective:    Patient ID: Karla Nelson, female    DOB: [birth year], 46 y.o.   MRN: [id number]  HPI 46 yo AAF with known hx of COPD ? Asthma component , active smoker.  Karla Nelson 07/2011: FEV1 1.45 (59%), ratio 61 Has O2 at home -wears As needed  And At bedtime    10/04/2011 Acute OV  Returns for persistent cough and wheezing .  Was seen at her asthma and allergist office-Dr. Ishmael Holter- last week with cough and wheezing long with  sob with exertion for  7days . Was started on Steroid taper. Feels this is helping but believes symptoms will come right back as she finishes the steroids (as this has been her past experiences)   Has cough with min productive w/-  thick clear mucus along with nasal drip .  Dr. Ishmael Holter called our office on 10/01/11 and spoke with myself regarding her AB flare .  Recent pre/post spirometry in Dr. Ishmael Holter office shows some reversibility post SABA.  She is now on [place] .   Of note, MAR shows Labetalol 300mg  Three times a day  , Lisinopril daily  Along with active smoker.  We discussed in detail smoking cessation .  Over last 6-9 months has had 4 ER visits for asthma flares along with multiple OV for uncontrolled asthma Uses her rescue inhaler several times a day B/p is very elevated today -pt says she has not taken her meds of yet.  No headache/visual changes or edema   Last cxr ~06/2011 was w/ no acute process   Pt will be changing pulmonary docs -from Dr. [name] to Dr. [name]  Have discussed case with Dr. [name]  >>cont spiriva and Karla Nelson, stop lisinopril  >benicar    11/04/2011 Follow up  Returns for follow up of COPD /Asthma  Last ov lisinopril stopped and benicar added  She is feeling better but had 1 attack last week w/ ED visit. Tx w/ solumedrol and left AMA .  Felt better w/ decreased wheezing . Had low O2 sat at 86% , Has o2 at home - wears it As needed  And At bedtime   Ran out of spiriva 1 week ago.  Reminded her that her insurance  Medicaid will cover her inhaler as she was depending on samples for these.  She has cut back on smoking down 4 cigs daily .  We discussed her high dose Labetolol. She has had difficult to control b/p and is on 900mg  of Labetolol daily .  She has agreed to make ov with PCP to discuss changing this.  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 No edema.    Flu Shot today  Restart Spiriva 1 puff daily  Continue on Dulera 2 puffs Twice daily  Brush/rinse and gargle after inhaler use.  Remain off LISINOPRIL  Change Benicar to Losartan 100mg  daily (this should be covered on your insurance)  Stop smoking is most important goal.  follow up Dr. [name] in 4 weeks  We are referring you to ENT for open cleft palate.  Please contact office for sooner follow up if symptoms do not improve or worsen or seek emergency care  Set up visit with Dr. Arnoldo Morale regarding you elevated blood pressure And changing Labetolol.    OV 12/02/2011 PCP is [name], MD Cards is Dr [name]  46 year old [place] female. Active SMoker.  On methadone program  for prior heroin addiction, NSTEMI Aug 2012 with diast dysfn, COPD Moderate wit significant BD response but on o2 (uses o2 prn esp at night), Severe hypertenssion wit normal Renal artery on multiple drugs. Also ha hx of cleft palate (dr Karla Nelson).  Lives alone with dog (parents and son nearby)  Today OV 12/02/2011: Wants humidifer oxygen due to nasal dryness. Feels she is in mild AEcopd past few days - more wheezing, dyspnea and greenoish sinus drainage. Reports compliance with spiriva and symbicort. COPD CAT score (below) is 32 and reflects heavy disease burden of copd   CAT COPD Symptom & Quality of Life Score (GSK trademark) 0 is no burden. 5 is highest burden 12/02/2011   Never Cough -> Cough all the time 5  No phlegm in chest -> Chest is full of phlegm 1  No chest tightness -> Chest  feels very tight 2  No dyspnea for 1 flight stairs/hill -> Very dyspneic for 1 flight of stairs 5  No limitations for ADL at home -> Very limited with ADL at home 5  Confident leaving home -> Not at all confident leaving home 5  Sleep soundly -> Do not sleep soundly because of lung condition 5  Lots of Energy -> No energy at all 4  TOTAL Score (max 40)  32      Review of Systems  Constitutional: Negative for fever and unexpected weight change.  HENT: Positive for congestion, rhinorrhea and postnasal drip. Negative for ear pain, nosebleeds, sore throat, sneezing, trouble swallowing, dental problem and sinus pressure.   Eyes: Negative for redness and itching.  Respiratory: Positive for cough, shortness of breath and wheezing. Negative for chest tightness.   Cardiovascular: Negative for palpitations and leg swelling.  Gastrointestinal: Negative for nausea and vomiting.  Genitourinary: Negative for dysuria.  Musculoskeletal: Negative for joint swelling.  Skin: Negative for rash.  Neurological: Negative for headaches.  Hematological: Does not bruise/bleed easily.  Psychiatric/Behavioral: Negative for dysphoric mood. The patient is not nervous/anxious.        Family History  Problem Relation Age of Onset  . Diabetes    . Hypertension    . Asthma Mother   . Asthma Brother   . Heart disease Mother      History   Social History  . Marital Status: Legally Separated    Spouse Name: N/A    Number of Children: N/A  . Years of Education: N/A   Occupational History  . Not on file.   Social History Main Topics  . Smoking status: Current Every Day Smoker -- 0.5 packs/day for 31 years    Types: Cigarettes  . Smokeless tobacco: Never Used   Comment: 4 CIGS A DAY  . Alcohol Use: No  . Drug Use: No     former drug use  . Sexually Active: Not on file   Other Topics Concern  . Not on file   Social History Narrative  . No narrative on file     No Known Allergies    Outpatient Prescriptions Prior to Visit  Medication Sig Dispense Refill  . albuterol (PROAIR HFA) 108 (90 BASE) MCG/ACT inhaler Inhale 2 puffs into the lungs every 6 (six) hours as needed. Shortness of breath      . albuterol (PROVENTIL) (2.5 MG/3ML) 0.083% nebulizer solution Take 3 mLs (2.5 mg total) by nebulization every 6 (six) hours as needed for wheezing.  75 mL  12  . b complex vitamins tablet Take 1 tablet by mouth daily.        Marland Kitchen  buPROPion (WELLBUTRIN XL) 150 MG 24 hr tablet Take 150 mg by mouth daily.      . hydrALAZINE (APRESOLINE) 50 MG tablet Take 1 tablet (50 mg total) by mouth 3 (three) times daily.  270 tablet  1  . hydrochlorothiazide (HYDRODIURIL) 25 MG tablet Take 50 mg by mouth daily.      Marland Kitchen losartan (COZAAR) 100 MG tablet Take 1 tablet (100 mg total) by mouth daily.  30 tablet  11  . METHADONE HCL PO Take 33 mg by mouth every morning.       . Mometasone Furo-Formoterol Fum (DULERA) 200-5 MCG/ACT AERO Inhale 2 puffs into the lungs 2 (two) times daily.  1 Inhaler  5  . Multiple Vitamin (MULTIVITAMIN) capsule Take 1 capsule by mouth daily.        Marland Kitchen NITROSTAT 0.4 MG SL tablet PLACE 1 TABLET UNDER TONGUE EVERY 5 MINS AS NEEDED MAY REPEAT 3 TIMES  25 tablet  3  . Omega-3 Fatty Acids (FISH OIL BURP-LESS PO) Take 1 tablet by mouth daily.       Marland Kitchen tiotropium (SPIRIVA) 18 MCG inhalation capsule Place 1 capsule (18 mcg total) into inhaler and inhale daily.  30 capsule  5  . labetalol (NORMODYNE) 300 MG tablet Take 1 tablet (300 mg total) by mouth 3 (three) times daily.  270 tablet  1      Current outpatient prescriptions:albuterol (PROAIR HFA) 108 (90 BASE) MCG/ACT inhaler, Inhale 2 puffs into the lungs every 6 (six) hours as needed. Shortness of breath, Disp: , Rfl: ;  albuterol (PROVENTIL) (2.5 MG/3ML) 0.083% nebulizer solution, Take 3 mLs (2.5 mg total) by nebulization every 6 (six) hours as needed for wheezing., Disp: 75 mL, Rfl: 12;  b complex vitamins tablet, Take 1 tablet by  mouth daily.  , Disp: , Rfl:  buPROPion (WELLBUTRIN XL) 150 MG 24 hr tablet, Take 150 mg by mouth daily., Disp: , Rfl: ;  cloNIDine (CATAPRES) 0.1 MG tablet, Take 1 tablet by mouth Daily., Disp: , Rfl: ;  hydrALAZINE (APRESOLINE) 50 MG tablet, Take 1 tablet (50 mg total) by mouth 3 (three) times daily., Disp: 270 tablet, Rfl: 1;  hydrochlorothiazide (HYDRODIURIL) 25 MG tablet, Take 50 mg by mouth daily., Disp: , Rfl:  losartan (COZAAR) 100 MG tablet, Take 1 tablet (100 mg total) by mouth daily., Disp: 30 tablet, Rfl: 11;  METHADONE HCL PO, Take 33 mg by mouth every morning. , Disp: , Rfl: ;  Ms) 200-5 MCG/ACT AERO, Inhale 2 puffs into the lungs 2 (two) times daily., Disp: 1 Inhaler, Rfl: 5;  Multiple Vitamin (MULTIVITAMIN) capsule, Take 1 capsule by mouth daily.  , Disp: , Rfl:  NITROSTAT 0.4 MG SL tablet, PLACE 1 TABLET UNDER TONGUE EVERY 5 MINS AS NEEDED MAY REPEAT 3 TIMES, Disp: 25 tablet, Rfl: 3;  Omega-3 Fatty Acids (FISH OIL BURP-LESS PO), Take 1 tablet by mouth daily. , Disp: , Rfl: ;  tiotropium (SPIRIVA) 18 MCG inhalation capsule, Place 1 capsule (18 mcg total) into inhaler and inhale daily., Disp: 30 capsule, Rfl: 5     Objective:   Physical Exam GEN: A/Ox3; pleasant , NAD, well nourished   HEENT:  [place]/AT,  EACs-clear, TMs-wnl, NOSE-clear, THROAT-clear, no lesions, no postnasal drip or exudate noted.  Edentulous upper (dentures)  Upper palate open midline   NECK:  Supple w/ fair ROM; no JVD; normal carotid impulses w/o bruits; no thyromegaly or nodules palpated; no lymphadenopathy.  RESP  Coarse BS  w/o, wheezes/ rales/ or  rhonchi.no accessory muscle use, no dullness to percussion  CARD:  RRR, no m/r/g  , no peripheral edema, pulses intact, no cyanosis or clubbing.  GI:   Soft & nt; nml bowel sounds; no organomegaly or masses detected.  Musco: Warm bil, no deformities or joint swelling noted.   Neuro: alert, no focal deficits noted.    Skin: Warm, no lesions or rashes          Assessment & Plan:

## 2011-12-02 NOTE — Patient Instructions (Addendum)
#  COPD flare Take doxycycline 100mg  po twice daily x 5 days; take after meals and avoid sunlight Take prednisone 40 mg daily x 2 days, then 20mg  daily x 2 days, then 10mg  daily x 2 days, then 5mg  daily x 2 days and stop  #COPD  - glad you already had flu shot  - continue spiriva and dulera - we discussed pulmonary rehab: given your busy schedule we will hold off  -we will change your oxygen set up for humdifier  #Smoking   - please work on quitting smoking  #Followup  4-6 months or sooner if needed Alpha  1 genetic test at followup

## 2011-12-05 ENCOUNTER — Encounter: Payer: Self-pay | Admitting: Internal Medicine

## 2011-12-05 DIAGNOSIS — J441 Chronic obstructive pulmonary disease with (acute) exacerbation: Secondary | ICD-10-CM | POA: Insufficient documentation

## 2011-12-05 NOTE — Assessment & Plan Note (Signed)
#  COPD  - glad you already had flu shot  - continue spiriva and dulera - we discussed pulmonary rehab: given your busy schedule we will hold off  -we will change your oxygen set up for humdifier #Followup  4-6 months or sooner if needed Alpha  1 genetic test at followu

## 2011-12-05 NOTE — Assessment & Plan Note (Signed)
#  COPD flare Take doxycycline 100mg  po twice daily x 5 days; take after meals and avoid sunlight Take prednisone 40 mg daily x 2 days, then 20mg  daily x 2 days, then 10mg  daily x 2 days, then 5mg  daily x 2 days and stop p

## 2011-12-05 NOTE — Assessment & Plan Note (Signed)
#  Smoking   - please work on quitting smoking

## 2011-12-11 ENCOUNTER — Other Ambulatory Visit: Payer: Self-pay | Admitting: Nurse Practitioner

## 2011-12-27 ENCOUNTER — Other Ambulatory Visit: Payer: Self-pay | Admitting: Adult Health

## 2012-01-11 ENCOUNTER — Ambulatory Visit: Payer: Medicare Other | Admitting: Pulmonary Disease

## 2012-02-15 ENCOUNTER — Telehealth: Payer: Self-pay | Admitting: Internal Medicine

## 2012-02-15 NOTE — Telephone Encounter (Signed)
Spoke with [name] @ [facility], informed we do have cmn, it is in Dr [name] yellow folder to be signed, as soon as signed I will fax .[name]

## 2012-02-19 ENCOUNTER — Other Ambulatory Visit: Payer: Self-pay | Admitting: Internal Medicine

## 2012-02-23 ENCOUNTER — Telehealth: Payer: Self-pay | Admitting: Internal Medicine

## 2012-02-23 MED ORDER — HYDRALAZINE HCL 50 MG PO TABS: 50.0000 mg | ORAL_TABLET | Freq: Four times a day (QID) | ORAL | Status: DC | PRN

## 2012-02-23 NOTE — Telephone Encounter (Signed)
RX has been refilled per last RX. Nothing further was needed

## 2012-03-28 ENCOUNTER — Encounter: Payer: Medicare Other | Admitting: Internal Medicine

## 2012-04-14 ENCOUNTER — Ambulatory Visit: Payer: Medicare Other | Admitting: Internal Medicine

## 2012-05-13 ENCOUNTER — Other Ambulatory Visit: Payer: Self-pay | Admitting: Internal Medicine

## 2012-05-17 ENCOUNTER — Encounter: Payer: Self-pay | Admitting: Internal Medicine

## 2012-05-17 ENCOUNTER — Other Ambulatory Visit: Payer: Medicare Other

## 2012-05-17 ENCOUNTER — Ambulatory Visit (INDEPENDENT_AMBULATORY_CARE_PROVIDER_SITE_OTHER): Payer: Medicare Other | Admitting: Internal Medicine

## 2012-05-17 VITALS — BP 208/120 | HR 81 | Temp 98.3°F | Ht 65.0 in | Wt 179.4 lb

## 2012-05-17 DIAGNOSIS — J449 Chronic obstructive pulmonary disease, unspecified: Secondary | ICD-10-CM

## 2012-05-17 DIAGNOSIS — I10 Essential (primary) hypertension: Secondary | ICD-10-CM

## 2012-05-17 NOTE — Assessment & Plan Note (Addendum)
#  COPD  - disesae is stable  - continue spiriva and dulera and oxygen  - Do blood test for alpha 1 genetic cause of COPD - I have referred you to pulmonary rehabilitation; hopefully can start in the summer - At fu will discuss coming off ACE inhibitor #Followup  4-6 months or sooner if needed  COPD cat score at followup

## 2012-05-17 NOTE — Assessment & Plan Note (Signed)
#  High blood pressure  - Your blood pressure is very high.  Talk to your  primary care physician about this - If you have any headaches, double vision, confusion, loss of consciousness or seizures or feeling weird please go to the emergency room - Please meet with a coordinator to help you set up with a new primary care physician  #Smoking   - please work on quitting smoking

## 2012-05-17 NOTE — Progress Notes (Signed)
Subjective:    Patient ID: Karla Nelson, female    DOB: [birth year], 47 y.o.   MRN: [id number]  HPI 47 yo AAF with known hx of COPD ? Asthma component , active smoker.  Karla Nelson 07/2011: FEV1 1.45 (59%), ratio 61 Has O2 at home -wears As needed  And At bedtime    10/04/2011 Acute OV  Returns for persistent cough and wheezing .  Was seen at her asthma and allergist office-Karla Nelson- last week with cough and wheezing long with  sob with exertion for  7days . Was started on Steroid taper. Feels this is helping but believes symptoms will come right back as she finishes the steroids (as this has been her past experiences)   Has cough with min productive w/-  thick clear mucus along with nasal drip .  Karla Nelson called our office on 10/01/11 and spoke with myself regarding her AB flare .  Recent pre/post spirometry in Karla Nelson office shows some reversibility post SABA.  She is now on [place] .   Of note, MAR shows Labetalol 300mg  Three times a day  , Lisinopril daily  Along with active smoker.  We discussed in detail smoking cessation .  Over last 6-9 months has had 4 ER visits for asthma flares along with multiple OV for uncontrolled asthma Uses her rescue inhaler several times a day B/p is very elevated today -pt says she has not taken her meds of yet.  No headache/visual changes or edema   Last cxr ~06/2011 was w/ no acute process   Pt will be changing pulmonary docs -from Karla Nelson to Karla Nelson  Have discussed case with Karla Nelson  >>cont spiriva and Karla Nelson, stop lisinopril  >benicar    11/04/2011 Follow up  Returns for follow up of COPD /Asthma  Last ov lisinopril stopped and benicar added  She is feeling better but had 1 attack last week w/ ED visit. Tx w/ solumedrol and left AMA .  Felt better w/ decreased wheezing . Had low O2 sat at 86% , Has o2 at home - wears it As needed  And At bedtime   Ran out of spiriva 1 week ago.  Reminded her that her insurance  Medicaid will cover her inhaler as she was depending on samples for these.  She has cut back on smoking down 4 cigs daily .  We discussed her high dose Labetolol. She has had difficult to control b/p and is on 900mg  of Labetolol daily .  She has agreed to make ov with PCP to discuss changing this.  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 No edema.    Flu Shot today  Restart Spiriva 1 puff daily  Continue on Dulera 2 puffs Twice daily  Brush/rinse and gargle after inhaler use.  Remain off LISINOPRIL  Change Benicar to Losartan 100mg  daily (this should be covered on your insurance)  Stop smoking is most important goal.  follow up Karla Nelson in 4 weeks  We are referring you to ENT for open cleft palate.  Please contact office for sooner follow up if symptoms do not improve or worsen or seek emergency care  Set up visit with Dr. Arnoldo Morale regarding you elevated blood pressure And changing Labetolol.    OV 12/02/2011 PCP is Karla Millard, MD Cards is Dr Edmonia Nelson  47 year old [place] female. Active SMoker.  On methadone program  for prior heroin addiction, NSTEMI Aug 2012 with diast dysfn, COPD Moderate wit significant BD response but on o2 (uses o2 prn esp at night), Severe hypertenssion wit normal Renal artery on multiple drugs. Also ha hx of cleft palate (dr Wilburn Nelson).  Lives alone with dog (parents and son nearby)  Today OV 12/02/2011: Wants humidifer oxygen due to nasal dryness. Feels she is in mild AEcopd past few days - more wheezing, dyspnea and greenoish sinus drainage. Reports compliance with spiriva and symbicort. COPD CAT score (below) is 32 and reflects heavy disease burden of copd   REC   #COPD flare  Take doxycycline 100mg  po twice daily x 5 days; take after meals and avoid sunlight  Take prednisone 40 mg daily x 2 days, then 20mg  daily x 2 days, then 10mg  daily x 2 days, then 5mg  daily x 2 days and  stop  #COPD  - glad you already had flu shot  - continue spiriva and dulera  - we discussed pulmonary rehab: given your busy schedule we will hold off  -we will change your oxygen set up for humdifier  #Smoking  - please work on quitting smoking  #Followup  4-6 months or sooner if needed  Alpha 1 genetic test at followup    OV 05/17/2012  Followup COPD and smoking  - Still continued to smoke. She is unable to quit. However she has not relapsed into other narcotics. She maintains a methadone. Of interest to note is that she is attending school in setting substance abuse at [place]  - In terms of COPD: Disease is stable COPD cat score is 24 and better than before ; details are below. She is open to attending pulmonary rehabilitation summer of 2014 when she has time off from school. She is noted to be on ACE inhibitor and ARB  -  CAT COPD Symptom & Quality of Life Score (GSK trademark) 0 is no burden. 5 is highest burden 12/02/2011  05/17/2012   Never Cough -> Cough all the time 5 3  No phlegm in chest -> Chest is full of phlegm 1 2  No chest tightness -> Chest feels very tight 2 0  No dyspnea for 1 flight stairs/hill -> Very dyspneic for 1 flight of stairs 5 3  No limitations for ADL at home -> Very limited with ADL at home 5 4  Confident leaving home -> Not at all confident leaving home 5 5  Sleep soundly -> Do not sleep soundly because of lung condition 5 3  Lots of Energy -> No energy at all 4 4  TOTAL Score (max 40)  32 24   Past, Family, Social reviewed: no change since last visit other than the fact she is not too happy with her primary care physician. Her blood pressure today is high but her systolic and [id number] diastolic but she's not having any symptoms pertaining to the high blood pressure    Review of Systems  Constitutional: Negative for fever and unexpected weight change.  HENT: Positive for congestion. Negative for ear pain, nosebleeds, sore throat, rhinorrhea, sneezing,  trouble swallowing, dental problem, postnasal drip and sinus pressure.   Eyes: Negative for redness and itching.  Respiratory: Negative for cough, chest tightness, shortness of breath and wheezing.   Cardiovascular: Positive for leg swelling. Negative for palpitations.  Gastrointestinal: Negative for nausea and vomiting.  Genitourinary: Negative for dysuria.  Musculoskeletal: Negative for joint swelling.  Skin: Negative for rash.  Neurological: Negative for headaches.  Hematological: Does not bruise/bleed easily.  Psychiatric/Behavioral: Negative for dysphoric mood. The patient is not nervous/anxious.    Current outpatient prescriptions:b complex vitamins tablet, Take 1 tablet by mouth daily.  , Disp: , Rfl: ;  cloNIDine (CATAPRES) 0.1 MG tablet, Take 0.5 tablets by mouth Daily. , Disp: , Rfl: ;  METHADONE HCL PO, Take 33 mg by mouth every morning. , Disp: , Rfl: ;  Mometasone Furo-Formoterol Fum (DULERA) 200-5 MCG/ACT AERO, Inhale 2 puffs into the lungs 2 (two) times daily., Disp: 1 Inhaler, Rfl: 5 Multiple Vitamin (MULTIVITAMIN) capsule, Take 1 capsule by mouth daily.  , Disp: , Rfl: ;  NITROSTAT 0.4 MG SL tablet, PLACE 1 TABLET UNDER TONGUE EVERY 5 MINS AS NEEDED MAY REPEAT 3 TIMES, Disp: 25 tablet, Rfl: 3;  Omega-3 Fatty Acids (FISH OIL BURP-LESS PO), Take 1 tablet by mouth daily. , Disp: , Rfl: ;  tiotropium (SPIRIVA) 18 MCG inhalation capsule, Place 1 capsule (18 mcg total) into inhaler and inhale daily., Disp: 30 capsule, Rfl: 5 albuterol (PROAIR HFA) 108 (90 BASE) MCG/ACT inhaler, Inhale 2 puffs into the lungs every 6 (six) hours as needed. Shortness of breath, Disp: , Rfl: ;  albuterol (PROVENTIL) (2.5 MG/3ML) 0.083% nebulizer solution, Take 3 mLs (2.5 mg total) by nebulization every 6 (six) hours as needed for wheezing., Disp: 75 mL, Rfl: 12 hydrALAZINE (APRESOLINE) 50 MG tablet, Take 1 tablet (50 mg total) by mouth every 6 (six) hours as needed., Disp: 120 tablet, Rfl: 0;   hydrochlorothiazide (HYDRODIURIL) 50 MG tablet, Take 1 tablet (50 mg total) by mouth daily., Disp: 90 tablet, Rfl: 2;  labetalol (NORMODYNE) 300 MG tablet, TAKE 1 TABLET (300 MG TOTAL) BY MOUTH 3 (THREE) TIMES DAILY., Disp: 270 tablet, Rfl: 1 lisinopril (PRINIVIL,ZESTRIL) 40 MG tablet, TAKE 1 TABLET (40 MG TOTAL) BY MOUTH AT BEDTIME., Disp: 90 tablet, Rfl: 2;  losartan (COZAAR) 100 MG tablet, Take 1 tablet (100 mg total) by mouth daily., Disp: 30 tablet, Rfl: 11     Objective:   Physical Exam Filed Vitals:   05/17/12 1406  BP: 208/120  Pulse: 81  Temp: 98.3 F (36.8 C)  TempSrc: Oral  Height: 5\' 5"  (1.651 m)  Weight: 179 lb 6.4 oz (81.375 kg)  SpO2: 95%     GEN: A/Ox3; pleasant , NAD, well nourished   HEENT:  [place]/AT,  EACs-clear, TMs-wnl, NOSE-clear, THROAT-clear, no lesions, no postnasal drip or exudate noted.  Edentulous upper (dentures)  Upper palate open midline   NECK:  Supple w/ fair ROM; no JVD; normal carotid impulses w/o bruits; no thyromegaly or nodules palpated; no lymphadenopathy.  RESP  Coarse BS  w/o, wheezes/ rales/ or rhonchi.no accessory muscle use, no dullness to percussion  CARD:  RRR, no m/r/g  , no peripheral edema, pulses intact, no cyanosis or clubbing.  GI:   Soft & nt; nml bowel sounds; no organomegaly or masses detected.  Musco: Warm bil, no deformities or joint swelling noted.   Neuro: alert, no focal deficits noted.    Skin: Warm, no lesions or rashes            Assessment & Plan:

## 2012-05-17 NOTE — Patient Instructions (Addendum)
#  COPD  - disesae is stable  - continue spiriva and dulera and oxygen  - Do blood test for alpha 1 genetic cause of COPD - I have referred you to pulmonary rehabilitation; hopefully can start in the summer - At [facility] will discuss coming off ACE inhibitor  #High blood pressure  - Your blood pressure is very high.  Talk to your  primary care physician about this - If you have any headaches, double vision, confusion, loss of consciousness or seizures or feeling weird please go to the emergency room - Please meet with a coordinator to help you set up with a new primary care physician  #Smoking   - please work on quitting smoking  #Followup  4-6 months or sooner if needed  COPD cat score at followup

## 2012-05-22 ENCOUNTER — Encounter (HOSPITAL_COMMUNITY): Payer: Self-pay | Admitting: *Deleted

## 2012-05-22 ENCOUNTER — Emergency Department (INDEPENDENT_AMBULATORY_CARE_PROVIDER_SITE_OTHER)
Admission: EM | Admit: 2012-05-22 | Discharge: 2012-05-22 | Disposition: A | Discharge status: Home / Self Care | Payer: Medicare Other | Source: Home / Self Care | Attending: Emergency Medicine | Admitting: Emergency Medicine

## 2012-05-22 ENCOUNTER — Emergency Department (INDEPENDENT_AMBULATORY_CARE_PROVIDER_SITE_OTHER): Payer: Medicare Other

## 2012-05-22 DIAGNOSIS — M47817 Spondylosis without myelopathy or radiculopathy, lumbosacral region: Secondary | ICD-10-CM

## 2012-05-22 DIAGNOSIS — M47816 Spondylosis without myelopathy or radiculopathy, lumbar region: Secondary | ICD-10-CM

## 2012-05-22 MED ORDER — MELOXICAM 7.5 MG PO TABS: 7.5000 mg | ORAL_TABLET | Freq: Every day | ORAL | Status: DC

## 2012-05-22 NOTE — ED Provider Notes (Signed)
And a History     CSN: [id number]  Arrival date & time 05/22/12  1150   First MD Initiated Contact with Patient 05/22/12 1213      Chief Complaint  Patient presents with  . Back Pain    (Consider location/radiation/quality/duration/timing/severity/associated sxs/prior treatment) HPI Comments: Patient presents to urgent care describing that she's been having pain for about 6 weeks in her lower back. She works as a Scientist, water quality at [employer] and she feels that her pain usually is exacerbated when she has to stand for long periods of time. Just describes it in the mornings her back feels stiff and she has problems incorporating or standing up for several minutes. Pain is exacerbated with both leaning forward and walking. She denies any other associated symptoms such as numbness, tingling sensation or weakness of her lower extremities. She also denies constitutional symptoms such as fevers, unintentional weight loss associated abdominal pain or changes in appetite. Patient has been sick recently seen by her pulmonologist which she describes is referring her for a new primary care Dr. she's having problems with her Dr. currently. She reports she continues to take her blood pressure medicines and denies any symptoms such as chest pains, shortness of breath or neurological symptom such as headaches visual changes paresthesias or weakness.  Patient is a 47 y.o. female presenting with back pain. The history is provided by the patient.  Back Pain Location:  Sacro-iliac joint and lumbar spine Quality:  Aching Radiates to:  Does not radiate Pain severity:  Moderate Pain is:  Worse during the day Onset quality:  Gradual Duration:  6 weeks Timing:  Constant Context: physical stress   Context: not recent injury   Relieved by:  Nothing Worsened by:  Coughing, movement, standing, bending and ambulation Ineffective treatments:  None tried Associated symptoms: no abdominal pain, no bladder incontinence, no  bowel incontinence, no chest pain, no fever, no headaches, no leg pain, no numbness, no paresthesias, no pelvic pain, no perianal numbness, no tingling, no weakness and no weight loss   Risk factors: no recent surgery     Past Medical History  Diagnosis Date  . HTN (hypertension)     Has normal renal arteries.  . Cleft palate   . NSTEMI (non-ST elevated myocardial infarction) 09-25-2010  . DYSPNEA ON EXERTION 03/08/2007  . ELECTROCARDIOGRAM, ABNORMAL 02/13/2010  . CAD (coronary artery disease)     NSTEMI with cath in August 2012 with nonobstructive disease, 50% ostial D1 and 70% distal LCX and PL disease. Normal EF. Has diastolic dysfunction with elevated EDP  . Tobacco abuse disorder   . COPD (chronic obstructive pulmonary disease)   . Hyperlipidemia   . Asthma     Past Surgical History  Procedure Laterality Date  . Cesarean section    . Cardiac catheterization  Aug 2012    Moderate nonobstructive multivessel CAD with an EF of 70 to 75%  . Coronary angioplasty with stent placement      Family History  Problem Relation Age of Onset  . Diabetes    . Hypertension    . Asthma Mother   . Asthma Brother   . Heart disease Mother     History  Substance Use Topics  . Smoking status: Current Every Day Smoker -- 0.50 packs/day for 31 years    Types: Cigarettes  . Smokeless tobacco: Never Used     Comment: 4 CIGS A DAY  . Alcohol Use: No    OB History   Grav  Para Term Preterm Abortions TAB SAB Ect Mult Living            1      Review of Systems  Constitutional: Negative for fever, weight loss, diaphoresis, activity change, appetite change and fatigue.  Cardiovascular: Negative for chest pain.  Gastrointestinal: Negative for abdominal pain and bowel incontinence.  Genitourinary: Negative for bladder incontinence, frequency, flank pain and pelvic pain.  Musculoskeletal: Positive for back pain. Negative for myalgias, joint swelling, arthralgias and gait problem.  Skin:  Negative for color change, pallor, rash and wound.  Neurological: Negative for dizziness, tingling, weakness, numbness, headaches and paresthesias.    Allergies  Review of patient's allergies indicates no known allergies.  Home Medications   Current Outpatient Rx  Name  Route  Sig  Dispense  Refill  . albuterol (PROAIR HFA) 108 (90 BASE) MCG/ACT inhaler   Inhalation   Inhale 2 puffs into the lungs every 6 (six) hours as needed. Shortness of breath         . albuterol (PROVENTIL) (2.5 MG/3ML) 0.083% nebulizer solution   Nebulization   Take 3 mLs (2.5 mg total) by nebulization every 6 (six) hours as needed for wheezing.   75 mL   12   . b complex vitamins tablet   Oral   Take 1 tablet by mouth daily.           . cloNIDine (CATAPRES) 0.1 MG tablet   Oral   Take 0.5 tablets by mouth Daily.          . hydrALAZINE (APRESOLINE) 50 MG tablet   Oral   Take 1 tablet (50 mg total) by mouth every 6 (six) hours as needed.   120 tablet   0   . hydrochlorothiazide (HYDRODIURIL) 50 MG tablet   Oral   Take 1 tablet (50 mg total) by mouth daily.   90 tablet   2   . labetalol (NORMODYNE) 300 MG tablet      TAKE 1 TABLET (300 MG TOTAL) BY MOUTH 3 (THREE) TIMES DAILY.   270 tablet   1   . lisinopril (PRINIVIL,ZESTRIL) 40 MG tablet      TAKE 1 TABLET (40 MG TOTAL) BY MOUTH AT BEDTIME.   90 tablet   2   . losartan (COZAAR) 100 MG tablet   Oral   Take 1 tablet (100 mg total) by mouth daily.   30 tablet   11   . meloxicam (MOBIC) 7.5 MG tablet   Oral   Take 1 tablet (7.5 mg total) by mouth daily. Take one tablet daily for 2 weeks   14 tablet   0   . METHADONE HCL PO   Oral   Take 33 mg by mouth every morning.          . Mometasone Furo-Formoterol Fum (DULERA) 200-5 MCG/ACT AERO   Inhalation   Inhale 2 puffs into the lungs 2 (two) times daily.   1 Inhaler   5   . Multiple Vitamin (MULTIVITAMIN) capsule   Oral   Take 1 capsule by mouth daily.           Marland Kitchen  NITROSTAT 0.4 MG SL tablet      PLACE 1 TABLET UNDER TONGUE EVERY 5 MINS AS NEEDED MAY REPEAT 3 TIMES   25 tablet   3   . Omega-3 Fatty Acids (FISH OIL BURP-LESS PO)   Oral   Take 1 tablet by mouth daily.          Marland Kitchen  tiotropium (SPIRIVA) 18 MCG inhalation capsule   Inhalation   Place 1 capsule (18 mcg total) into inhaler and inhale daily.   30 capsule   5     BP 208/117  Pulse 78  Temp(Src) 98.4 F (36.9 C) (Oral)  Resp 16  SpO2 94%  LMP 05/15/2012  Physical Exam  Nursing note and vitals reviewed. Constitutional: She is oriented to person, place, and time. No distress.  Eyes: No scleral icterus.  Cardiovascular: Normal rate.  Exam reveals no gallop and no friction rub.   No murmur heard. Musculoskeletal: She exhibits tenderness.       Lumbar back: She exhibits decreased range of motion, tenderness, bony tenderness and pain. She exhibits no swelling, no edema, no deformity, no spasm and normal pulse.       Back:  Neurological: She is alert and oriented to person, place, and time.  Skin: No rash noted. No erythema.    ED Course  Procedures (including critical care time)  Labs Reviewed - No data to display Dg Lumbar Spine Complete  05/22/2012  *RADIOLOGY REPORT*  Clinical Data: Low back pain for 6 weeks  LUMBAR SPINE - COMPLETE 4+ VIEW  Comparison: None.  Findings: Five lumbar-type vertebral bodies show normal alignment. There is disc space narrowing at the L5-S1 level consistent with chronic disc degeneration.  There is mild facet degeneration at L4- 5 and L5-S1.  No pars defect or slippage.  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  IMPRESSION: Degenerative disc disease at L5-S1 with disc space narrowing.  Lower lumbar facet degeneration.  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   Original Report Authenticated By: Nelson Chimes, M.D.      1. Degenerative arthritis of lumbar spine       MDM  Chronic back pain. No  neurological or muscular deficits. Doubt spinal stenosis. Have instructed patient to followup with her primary care Dr. an orthopedic doctor for her back pain and she might need physical therapy or further evaluation if no  Improvement- patient was prescribed a 2 week course of meloxicam and was- instructed to followup with her primary care Dr. for blood pressure.       Rosana Hoes, MD 05/22/12 1400

## 2012-05-22 NOTE — ED Notes (Signed)
Pt reports lower back pain when she coughs that has been ongoing for 6 weeks or more. Pt states that she is prescribed oxygen 24/7 but does not wear it  Due to school and work. o2 stat upon walking to room was 88% - did come up to 93% after resting on bed. Pt also states that she took HTN med today - denies vision changes , headaches, urinary problems or chest pain. States that she smokes 5-6 cig a day and does hooka as well

## 2012-05-28 ENCOUNTER — Telehealth: Payer: Self-pay | Admitting: Internal Medicine

## 2012-05-28 NOTE — Telephone Encounter (Signed)
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 Good gene M trying protect lung but with continued smoking it cannot keep doing good job by itself and her copd will get worse because she does not have 2 good genes to protect lung. So quit smoking immediately  Dr. Brand Males, M.D., [facility].C.P Pulmonary and Critical Care Medicine Staff Physician [place] [facility] Pager: [phone], If no answer or between  15:00h - 7:00h: call [phone]  05/28/2012 9:39 PM

## 2012-05-29 NOTE — Telephone Encounter (Signed)
Pt aware. Kalim Kissel, CMA  

## 2012-06-14 ENCOUNTER — Other Ambulatory Visit: Payer: Self-pay | Admitting: Internal Medicine

## 2012-06-16 ENCOUNTER — Emergency Department (HOSPITAL_COMMUNITY): Payer: Medicare Other

## 2012-06-16 ENCOUNTER — Encounter (HOSPITAL_COMMUNITY): Payer: Self-pay | Admitting: Family Medicine

## 2012-06-16 ENCOUNTER — Other Ambulatory Visit: Payer: Self-pay

## 2012-06-16 ENCOUNTER — Inpatient Hospital Stay (HOSPITAL_COMMUNITY)
Admission: EM | Admit: 2012-06-16 | Discharge: 2012-06-19 | DRG: 193 | Disposition: A | Discharge status: Home / Self Care | Payer: Medicare Other | Attending: Internal Medicine | Admitting: Internal Medicine

## 2012-06-16 DIAGNOSIS — J441 Chronic obstructive pulmonary disease with (acute) exacerbation: Secondary | ICD-10-CM

## 2012-06-16 DIAGNOSIS — N179 Acute kidney failure, unspecified: Secondary | ICD-10-CM

## 2012-06-16 DIAGNOSIS — N189 Chronic kidney disease, unspecified: Secondary | ICD-10-CM | POA: Diagnosis present

## 2012-06-16 DIAGNOSIS — I2510 Atherosclerotic heart disease of native coronary artery without angina pectoris: Secondary | ICD-10-CM

## 2012-06-16 DIAGNOSIS — J4489 Other specified chronic obstructive pulmonary disease: Secondary | ICD-10-CM | POA: Diagnosis present

## 2012-06-16 DIAGNOSIS — Z79899 Other long term (current) drug therapy: Secondary | ICD-10-CM

## 2012-06-16 DIAGNOSIS — R0989 Other specified symptoms and signs involving the circulatory and respiratory systems: Secondary | ICD-10-CM

## 2012-06-16 DIAGNOSIS — J189 Pneumonia, unspecified organism: Secondary | ICD-10-CM

## 2012-06-16 DIAGNOSIS — J449 Chronic obstructive pulmonary disease, unspecified: Secondary | ICD-10-CM | POA: Diagnosis present

## 2012-06-16 DIAGNOSIS — R9431 Abnormal electrocardiogram [ECG] [EKG]: Secondary | ICD-10-CM

## 2012-06-16 DIAGNOSIS — I248 Other forms of acute ischemic heart disease: Secondary | ICD-10-CM | POA: Diagnosis present

## 2012-06-16 DIAGNOSIS — F17200 Nicotine dependence, unspecified, uncomplicated: Secondary | ICD-10-CM | POA: Diagnosis present

## 2012-06-16 DIAGNOSIS — R079 Chest pain, unspecified: Secondary | ICD-10-CM

## 2012-06-16 DIAGNOSIS — I214 Non-ST elevation (NSTEMI) myocardial infarction: Secondary | ICD-10-CM | POA: Diagnosis not present

## 2012-06-16 DIAGNOSIS — E785 Hyperlipidemia, unspecified: Secondary | ICD-10-CM | POA: Diagnosis present

## 2012-06-16 DIAGNOSIS — Z720 Tobacco use: Secondary | ICD-10-CM | POA: Diagnosis present

## 2012-06-16 DIAGNOSIS — Z9861 Coronary angioplasty status: Secondary | ICD-10-CM

## 2012-06-16 DIAGNOSIS — Q359 Cleft palate, unspecified: Secondary | ICD-10-CM

## 2012-06-16 DIAGNOSIS — N390 Urinary tract infection, site not specified: Secondary | ICD-10-CM | POA: Diagnosis present

## 2012-06-16 DIAGNOSIS — E782 Mixed hyperlipidemia: Secondary | ICD-10-CM

## 2012-06-16 DIAGNOSIS — I2489 Other forms of acute ischemic heart disease: Secondary | ICD-10-CM | POA: Diagnosis present

## 2012-06-16 DIAGNOSIS — N182 Chronic kidney disease, stage 2 (mild): Secondary | ICD-10-CM | POA: Diagnosis present

## 2012-06-16 DIAGNOSIS — R0902 Hypoxemia: Secondary | ICD-10-CM | POA: Diagnosis present

## 2012-06-16 DIAGNOSIS — F1120 Opioid dependence, uncomplicated: Secondary | ICD-10-CM | POA: Diagnosis present

## 2012-06-16 DIAGNOSIS — I129 Hypertensive chronic kidney disease with stage 1 through stage 4 chronic kidney disease, or unspecified chronic kidney disease: Secondary | ICD-10-CM | POA: Diagnosis present

## 2012-06-16 DIAGNOSIS — I517 Cardiomegaly: Secondary | ICD-10-CM | POA: Diagnosis present

## 2012-06-16 DIAGNOSIS — I10 Essential (primary) hypertension: Secondary | ICD-10-CM | POA: Diagnosis present

## 2012-06-16 LAB — CBC
Hemoglobin: 15.2 g/dL — ABNORMAL HIGH (ref 12.0–15.0)
MCH: 29.3 pg (ref 26.0–34.0)
MCV: 83.6 fL (ref 78.0–100.0)
RBC: 5.19 MIL/uL — ABNORMAL HIGH (ref 3.87–5.11)

## 2012-06-16 LAB — URINALYSIS, ROUTINE W REFLEX MICROSCOPIC
Glucose, UA: NEGATIVE mg/dL
Hgb urine dipstick: NEGATIVE
Specific Gravity, Urine: 1.023 (ref 1.005–1.030)
pH: 5 (ref 5.0–8.0)

## 2012-06-16 LAB — STREP PNEUMONIAE URINARY ANTIGEN: Strep Pneumo Urinary Antigen: NEGATIVE

## 2012-06-16 LAB — BASIC METABOLIC PANEL
CO2: 29 mEq/L (ref 19–32)
Calcium: 9.7 mg/dL (ref 8.4–10.5)
Chloride: 95 mEq/L — ABNORMAL LOW (ref 96–112)
Creatinine, Ser: 1.55 mg/dL — ABNORMAL HIGH (ref 0.50–1.10)
Glucose, Bld: 156 mg/dL — ABNORMAL HIGH (ref 70–99)

## 2012-06-16 MED ORDER — SODIUM CHLORIDE 0.9 % IV SOLN
INTRAVENOUS | Status: DC
  Administered 2012-06-16 – 2012-06-18 (×5): via INTRAVENOUS

## 2012-06-16 MED ORDER — HYDRALAZINE HCL 25 MG PO TABS
25.0000 mg | ORAL_TABLET | Freq: Three times a day (TID) | ORAL | Status: DC
  Administered 2012-06-16 – 2012-06-19 (×10): 25 mg via ORAL
  Filled 2012-06-16 (×12): qty 1

## 2012-06-16 MED ORDER — ENOXAPARIN SODIUM 40 MG/0.4ML ~~LOC~~ SOLN
40.0000 mg | Freq: Every day | SUBCUTANEOUS | Status: DC
  Administered 2012-06-16 – 2012-06-18 (×3): 40 mg via SUBCUTANEOUS
  Filled 2012-06-16 (×4): qty 0.4

## 2012-06-16 MED ORDER — DEXTROSE 5 % IV SOLN
1.0000 g | Freq: Once | INTRAVENOUS | Status: AC
  Administered 2012-06-16: 1 g via INTRAVENOUS
  Filled 2012-06-16: qty 10

## 2012-06-16 MED ORDER — CLONIDINE HCL 0.1 MG PO TABS
0.1000 mg | ORAL_TABLET | Freq: Two times a day (BID) | ORAL | Status: DC
  Administered 2012-06-16 – 2012-06-19 (×6): 0.1 mg via ORAL
  Filled 2012-06-16 (×7): qty 1

## 2012-06-16 MED ORDER — DEXTROSE 5 % IV SOLN
500.0000 mg | INTRAVENOUS | Status: DC
  Administered 2012-06-17 – 2012-06-19 (×3): 500 mg via INTRAVENOUS
  Filled 2012-06-16 (×3): qty 500

## 2012-06-16 MED ORDER — SODIUM CHLORIDE 0.9 % IV SOLN
INTRAVENOUS | Status: AC
  Administered 2012-06-16: 13:00:00 via INTRAVENOUS

## 2012-06-16 MED ORDER — ONDANSETRON HCL 4 MG/2ML IJ SOLN
4.0000 mg | Freq: Four times a day (QID) | INTRAMUSCULAR | Status: DC | PRN
  Administered 2012-06-18 – 2012-06-19 (×2): 4 mg via INTRAVENOUS

## 2012-06-16 MED ORDER — ONDANSETRON HCL 4 MG/2ML IJ SOLN
4.0000 mg | Freq: Once | INTRAMUSCULAR | Status: AC
  Administered 2012-06-16: 4 mg via INTRAVENOUS
  Filled 2012-06-16: qty 2

## 2012-06-16 MED ORDER — ALBUTEROL SULFATE (5 MG/ML) 0.5% IN NEBU
2.5000 mg | INHALATION_SOLUTION | Freq: Four times a day (QID) | RESPIRATORY_TRACT | Status: DC
  Administered 2012-06-16 – 2012-06-17 (×4): 2.5 mg via RESPIRATORY_TRACT
  Filled 2012-06-16 (×3): qty 0.5

## 2012-06-16 MED ORDER — METHADONE HCL 10 MG PO TABS
30.0000 mg | ORAL_TABLET | Freq: Every day | ORAL | Status: DC
  Administered 2012-06-17: 30 mg via ORAL
  Filled 2012-06-16: qty 2
  Filled 2012-06-16: qty 3

## 2012-06-16 MED ORDER — LABETALOL HCL 300 MG PO TABS
300.0000 mg | ORAL_TABLET | Freq: Three times a day (TID) | ORAL | Status: DC
  Administered 2012-06-16 (×2): 300 mg via ORAL
  Filled 2012-06-16 (×5): qty 1

## 2012-06-16 MED ORDER — ACETAMINOPHEN 325 MG PO TABS: 650.0000 mg | ORAL_TABLET | Freq: Four times a day (QID) | ORAL | Status: DC | PRN

## 2012-06-16 MED ORDER — AZITHROMYCIN 500 MG IV SOLR
500.0000 mg | Freq: Once | INTRAVENOUS | Status: AC
  Administered 2012-06-16: 500 mg via INTRAVENOUS
  Filled 2012-06-16: qty 500

## 2012-06-16 MED ORDER — MORPHINE SULFATE 2 MG/ML IJ SOLN
1.0000 mg | INTRAMUSCULAR | Status: DC | PRN
  Administered 2012-06-16 – 2012-06-19 (×5): 2 mg via INTRAVENOUS
  Filled 2012-06-16 (×5): qty 1

## 2012-06-16 MED ORDER — ONDANSETRON HCL 4 MG/2ML IJ SOLN
4.0000 mg | Freq: Four times a day (QID) | INTRAMUSCULAR | Status: DC | PRN
  Filled 2012-06-16 (×2): qty 2

## 2012-06-16 MED ORDER — TIOTROPIUM BROMIDE MONOHYDRATE 18 MCG IN CAPS
18.0000 ug | ORAL_CAPSULE | Freq: Every day | RESPIRATORY_TRACT | Status: DC
  Administered 2012-06-17 – 2012-06-19 (×3): 18 ug via RESPIRATORY_TRACT
  Filled 2012-06-16: qty 5

## 2012-06-16 MED ORDER — SODIUM CHLORIDE 0.9 % IV BOLUS (SEPSIS)
1000.0000 mL | Freq: Once | INTRAVENOUS | Status: AC
  Administered 2012-06-16: 1000 mL via INTRAVENOUS

## 2012-06-16 MED ORDER — DEXTROSE 5 % IV SOLN
1.0000 g | INTRAVENOUS | Status: DC
  Administered 2012-06-17 – 2012-06-19 (×3): 1 g via INTRAVENOUS
  Filled 2012-06-16 (×3): qty 10

## 2012-06-16 MED ORDER — TRAMADOL HCL 50 MG PO TABS
50.0000 mg | ORAL_TABLET | Freq: Once | ORAL | Status: AC
  Administered 2012-06-16: 50 mg via ORAL
  Filled 2012-06-16: qty 1

## 2012-06-16 MED ORDER — ACETAMINOPHEN 325 MG PO TABS
650.0000 mg | ORAL_TABLET | Freq: Once | ORAL | Status: AC
  Administered 2012-06-16: 650 mg via ORAL
  Filled 2012-06-16: qty 2

## 2012-06-16 MED ORDER — NITROGLYCERIN 0.4 MG SL SUBL: 0.4000 mg | SUBLINGUAL_TABLET | SUBLINGUAL | Status: DC | PRN

## 2012-06-16 MED ORDER — ACETAMINOPHEN 325 MG PO TABS
650.0000 mg | ORAL_TABLET | Freq: Four times a day (QID) | ORAL | Status: DC | PRN
  Administered 2012-06-18: 650 mg via ORAL
  Filled 2012-06-16: qty 2

## 2012-06-16 MED ORDER — ALBUTEROL SULFATE (5 MG/ML) 0.5% IN NEBU
2.5000 mg | INHALATION_SOLUTION | RESPIRATORY_TRACT | Status: DC | PRN
Start: 2012-06-16 — End: 2012-06-19

## 2012-06-16 MED ORDER — ALBUTEROL SULFATE (5 MG/ML) 0.5% IN NEBU: 2.5000 mg | INHALATION_SOLUTION | Freq: Four times a day (QID) | RESPIRATORY_TRACT | Status: DC | PRN

## 2012-06-16 MED ORDER — HYDROCHLOROTHIAZIDE 50 MG PO TABS
50.0000 mg | ORAL_TABLET | Freq: Every day | ORAL | Status: DC
  Administered 2012-06-16: 50 mg via ORAL
  Filled 2012-06-16 (×2): qty 1

## 2012-06-16 MED ORDER — MORPHINE SULFATE 4 MG/ML IJ SOLN
4.0000 mg | Freq: Once | INTRAMUSCULAR | Status: AC
  Administered 2012-06-16: 4 mg via INTRAVENOUS
  Filled 2012-06-16: qty 1

## 2012-06-16 NOTE — ED Notes (Signed)
Dr. Canary Brim reported vital signs to.  i also informed her i placed the patient on a nasal cannula at 3liters.

## 2012-06-16 NOTE — ED Notes (Signed)
Per pt and family pt has had complete left side pain more in the left flank area. Per family was told by doctor she might have to go on dialysis. sts has been hurting since last night. sts pain with urination.

## 2012-06-16 NOTE — H&P (Signed)
[facility] History and Physical  Karla Nelson [id number] DOB: 1965-09-18 DOA: 06/16/2012  Referring physician: EDP PCP: [name], MD  Specialists: Pulm/Dr.Ramaswamy  Chief Complaint: L sided chest pain  HPI: Karla Nelson is a 47 y.o. 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 Low-back pain has finally started to get a little better now Yesterday patient noticed left-sided chest pain, which is worse when she took a deep breath and also on touching/palpating the left side of her chest and back. She also reports shortness of breath, feeling hot and cold, denies any cough, reports chronic congestion Upon evaluation the emergency room she was noted to be febrile with a temperature of 102.6, hypoxic at 82% on room air, white count of 26K and CXR with L lung opacity concerning for pneumonia.  Past Medical History  Diagnosis Date  . HTN (hypertension)     Has normal renal arteries.  . Cleft palate   . NSTEMI (non-ST elevated myocardial infarction) 09-25-2010  . DYSPNEA ON EXERTION 03/08/2007  . ELECTROCARDIOGRAM, ABNORMAL 02/13/2010  . CAD (coronary artery disease)     NSTEMI with cath in August 2012 with nonobstructive disease, 50% ostial D1 and 70% distal LCX and PL disease. Normal EF. Has diastolic dysfunction with elevated EDP  . Tobacco abuse disorder   . COPD (chronic obstructive pulmonary disease)   . Hyperlipidemia   . Asthma    Past Surgical History  Procedure Laterality Date  . Cesarean section    . Cardiac catheterization  Aug 2012    Moderate nonobstructive multivessel CAD with an EF of 70 to 75%  . Coronary angioplasty with stent placement     Social History:  reports  that she has been smoking Cigarettes.  She has a 15.5 pack-year smoking history. She has never used smokeless tobacco. She reports that she does not drink alcohol or use illicit drugs. Lives at home with significant other  Of syncopal No Known Allergies  Family History  Problem Relation Age of Onset  . Diabetes    . Hypertension    . Asthma Mother   . Asthma Brother   . Heart disease Mother     Prior to Admission medications   Medication Sig Start Date End Date Taking? Authorizing Provider  albuterol (PROAIR HFA) 108 (90 BASE) MCG/ACT inhaler Inhale 2 puffs into the lungs every 6 (six) hours as needed. Shortness of breath   Yes Historical Provider, MD  albuterol (PROVENTIL) (2.5 MG/3ML) 0.083% nebulizer solution Take 3 mLs (2.5 mg total) by nebulization every 6 (six) hours as needed for wheezing. 06/21/11 06/20/12 Yes Julieta Bellini, NP  cloNIDine (CATAPRES) 0.1 MG tablet Take 0.1 mg by mouth 2 (two) times daily.  11/11/11  Yes Historical Provider, MD  hydrALAZINE (APRESOLINE) 50 MG tablet Take 50 mg by mouth every 6 (six) hours as needed (for high Blood Pressure).   Yes Historical Provider, MD  hydrochlorothiazide (HYDRODIURIL) 50 MG tablet Take 1 tablet (50 mg total) by mouth daily. 12/11/11  Yes Burtis Junes, NP  labetalol (NORMODYNE) 300 MG tablet Take 300 mg by mouth 3 (three) times daily.   Yes Historical Provider, MD  lisinopril (PRINIVIL,ZESTRIL) 40 MG tablet Take 40 mg by mouth at bedtime.   Yes Historical Provider,  MD  losartan (COZAAR) 100 MG tablet Take 1 tablet (100 mg total) by mouth daily. 11/04/11  Yes Tammy S Parrett, NP  METHADONE HCL PO Take 33 mg by mouth every morning.    Yes Historical Provider, MD  nitroGLYCERIN (NITROSTAT) 0.4 MG SL tablet Place 0.4 mg under the tongue every 5 (five) minutes as needed for chest pain.   Yes Historical Provider, MD  Omega-3 Fatty Acids (FISH OIL BURP-LESS PO) Take 1 capsule by mouth daily.    Yes Historical Provider, MD  tiotropium  (SPIRIVA) 18 MCG inhalation capsule Place 1 capsule (18 mcg total) into inhaler and inhale daily. 11/04/11  Yes Melvenia Needles, NP   Physical Exam: Filed Vitals:   06/16/12 1215 06/16/12 1230 06/16/12 1245 06/16/12 1300  BP: 112/70 117/73 121/63 120/61  Pulse: 98 96 98 96  Temp:      TempSrc:      Resp: 17 16 16 19   SpO2: 94% 95% 96% 96%     General:  AAOx3  HEENT: PERRLA, EOMI  Cardiovascular: S1S2/Tcahycardic  Respiratory: decreased BS B/L, ronchi at L base  Chest: slight tenderness on palpation of lateral chest wall, rib cage laterally and posterolaterally  Abdomen: soft, Nt, BS present  Skin: soft, NT, BS present  Ext: no edema/c/c  Neuro: moves all extremities, no localizing signs  Labs on Admission:  Basic Metabolic Panel:  Recent Labs Lab 06/16/12 0930  NA 135  K 3.7  CL 95*  CO2 29  GLUCOSE 156*  BUN 26*  CREATININE 1.55*  CALCIUM 9.7   Liver Function Tests: No results found for this basename: AST, ALT, ALKPHOS, BILITOT, PROT, ALBUMIN,  in the last 168 hours No results found for this basename: LIPASE, AMYLASE,  in the last 168 hours No results found for this basename: AMMONIA,  in the last 168 hours CBC:  Recent Labs Lab 06/16/12 0930  WBC 26.4*  HGB 15.2*  HCT 43.4  MCV 83.6  PLT 257   Cardiac Enzymes: No results found for this basename: CKTOTAL, CKMB, CKMBINDEX, TROPONINI,  in the last 168 hours  BNP (last 3 results) No results found for this basename: PROBNP,  in the last 8760 hours CBG: No results found for this basename: GLUCAP,  in the last 168 hours  Radiological Exams on Admission: Dg Chest 2 View  06/16/2012  *RADIOLOGY REPORT*  Clinical Data: Fever, rib pain  CHEST - 2 VIEW  Comparison: Prior chest x-ray 06/21/2011  Findings: Interval development of focal rounded opacity in the medial left lower lobe which projects over the heart on the frontal view, and the spine on the lateral view.  Otherwise, the lungs are clear.  No pleural  effusion, pneumothorax or edema.  The cardiac and mediastinal contours remain within normal limits.  No acute osseous abnormality.  IMPRESSION: Interval development of a rounded opacity in the medial left lower lobe.  Given the clinical history, the most likely consideration is round pneumonia.  Recommend repeat chest x-ray following appropriate course of antibiotic therapy in 4 - 6 weeks to document resolution and exclude an underlying pulmonary nodule.   Original Report Authenticated By: Jacqulynn Cadet, M.D.     EKG: LVH with repolarization abnormality  Assessment/Plan Active Problems:   Community acquired pneumonia   HYPERTENSION   CAD (coronary artery disease)   Tobacco abuse   COPD (chronic obstructive pulmonary disease)   1. Community Acquired pneumonia - Start IV rocephin and Azithromycin -albuterol nebs, O2 -Urine legionella and pneumococcal Ag -  needs repeat imaging with CT chest if symptoms do not improve quickly  2. COPD: on home O2 at night and PRN -albuterol nebs PRN, spiriva -tobacco cessation  3. Pleuritic L sided chest pain -likely due to 1 -repeat imaging with CT as noted above depending on clinical course -narcotics PRN  4. H/o narcotic dependence on methadone -continue home dose methadone  5. CAD: stable -add ASA, continue labetalol  6. ARf on CKD -last baseline creatinine 1.3 in 5/13 -stop meloxicam /hold ACE/ARB -hydrate with NS   DVt proph: lovenox   Code Status: FULL Family Communication: none at bedside Disposition Plan: inpatient  Time spent: 29min  [facility] Pager [phone]  If 7PM-7AM, please contact night-coverage [email or web address] Password [facility] 06/16/2012, 2:48 PM

## 2012-06-16 NOTE — Progress Notes (Signed)
Patient is still having some pain on the lower left chest. States that morphine helped but is allowing her to breath deep and is still causing pain. States that she has taken ultram and ibuprofen when this has happened previously and would like to try this again. Provider on call notified and order for ultram was given by [facility]. Will administer once sent up from pharmacy and will continue to monitor.   Karla Nelson J. Conley Canal RN

## 2012-06-16 NOTE — ED Provider Notes (Signed)
History     CSN: [id number]  Arrival date & time 06/16/12  [id number]   First MD Initiated Contact with Patient 06/16/12 [phone]      Chief Complaint  Patient presents with  . left side pain     (Consider location/radiation/quality/duration/timing/severity/associated sxs/prior treatment) HPI Pt presents with c/o fever, and pain in her left ribcage.  Pt has hx of HTN, nstemi, CAD.  Pain is worse with movement and palpation, also worse with taking deep breaths.  No leg swelling.  No recent travel, no trauma/surgery, no hx of DVT/PE.  Fever began last night with sweats.  Has not had any treatment for symptoms.  No significant cough.  States had a physical yesterday and was told she might need dialysis if she wasn't more compliant with her HTN meds.  She states she takes these as prescribed, except clonidine which makes her feel tired.  Pt has generalized fatigue as well. There are no other associated systemic symptoms, there are no other alleviating or modifying factors.   Past Medical History  Diagnosis Date  . HTN (hypertension)     Has normal renal arteries.  . Cleft palate   . NSTEMI (non-ST elevated myocardial infarction) 09-25-2010  . DYSPNEA ON EXERTION 03/08/2007  . ELECTROCARDIOGRAM, ABNORMAL 02/13/2010  . CAD (coronary artery disease)     NSTEMI with cath in August 2012 with nonobstructive disease, 50% ostial D1 and 70% distal LCX and PL disease. Normal EF. Has diastolic dysfunction with elevated EDP  . Tobacco abuse disorder   . COPD (chronic obstructive pulmonary disease)   . Hyperlipidemia   . Asthma     Past Surgical History  Procedure Laterality Date  . Cesarean section    . Cardiac catheterization  Aug 2012    Moderate nonobstructive multivessel CAD with an EF of 70 to 75%  . Coronary angioplasty with stent placement      Family History  Problem Relation Age of Onset  . Diabetes    . Hypertension    . Asthma Mother   . Asthma Brother   . Heart disease Mother      History  Substance Use Topics  . Smoking status: Current Every Day Smoker -- 0.50 packs/day for 31 years    Types: Cigarettes  . Smokeless tobacco: Never Used     Comment: 4 CIGS A DAY  . Alcohol Use: No    OB History   Grav Para Term Preterm Abortions TAB SAB Ect Mult Living            1      Review of Systems ROS reviewed and all otherwise negative except for mentioned in HPI  Allergies  Review of patient's allergies indicates no known allergies.  Home Medications   No current outpatient prescriptions on file.  BP 112/67  Pulse 77  Temp(Src) 98.4 F (36.9 C) (Axillary)  Resp 16  Ht 5\' 5"  (1.651 m)  Wt 188 lb 11.4 oz (85.6 kg)  BMI 31.4 kg/m2  SpO2 98%  LMP 06/13/2012 Vitals reviewed Physical Exam Physical Examination: General appearance - alert, uncomfortable appearing, and in mild distress Mental status - alert, oriented to person, place, and time Eyes - no scleral icterus, no conjunctival injection Mouth - mucous membranes moist, pharynx normal without lesions Chest - clear to auscultation, no wheezes, rales or rhonchi, symmetric air entry Heart - normal rate, regular rhythm, normal S1, S2, no murmurs, rubs, clicks or gallops Abdomen - soft, nontender, nondistended, no masses or organomegaly Back  exam - full range of motion, no tenderness, palpable spasm or pain on motion Extremities - peripheral pulses normal, no pedal edema, no clubbing or cyanosis Skin - normal coloration and turgor, no rashes  ED Course  Procedures (including critical care time)  11:12 AM pt rechecked, she is feeling improved, discussed results and treatment plan with her. Also plan for admission.     Date: 06/16/2012  Rate: 110  Rhythm: sinus tachycardia  QRS Axis: normal  Intervals: normal  ST/T Wave abnormalities: nonspecific ST/T changes  Conduction Disutrbances:none  Narrative Interpretation:   Old EKG Reviewed: unchanged, no significant changes compared to prior ekg of  06/21/11 except rate faster   Labs Reviewed  URINALYSIS, ROUTINE W REFLEX MICROSCOPIC - Abnormal; Notable for the following:    Color, Urine AMBER (*)    Bilirubin Urine SMALL (*)    Ketones, ur 15 (*)    All other components within normal limits  CBC - Abnormal; Notable for the following:    WBC 26.4 (*)    RBC 5.19 (*)    Hemoglobin 15.2 (*)    All other components within normal limits  BASIC METABOLIC PANEL - Abnormal; Notable for the following:    Chloride 95 (*)    Glucose, Bld 156 (*)    BUN 26 (*)    Creatinine, Ser 1.55 (*)    GFR calc non Af Amer 39 (*)    GFR calc Af Amer 45 (*)    All other components within normal limits  CBC - Abnormal; Notable for the following:    WBC 26.4 (*)    All other components within normal limits  COMPREHENSIVE METABOLIC PANEL - Abnormal; Notable for the following:    Glucose, Bld 133 (*)    BUN 30 (*)    Creatinine, Ser 1.76 (*)    Calcium 8.0 (*)    Albumin 2.6 (*)    Alkaline Phosphatase 118 (*)    GFR calc non Af Amer 33 (*)    GFR calc Af Amer 38 (*)    All other components within normal limits  TROPONIN I - Abnormal; Notable for the following:    Troponin I 0.50 (*)    All other components within normal limits  URINE CULTURE  CULTURE, BLOOD (ROUTINE X 2)  CULTURE, BLOOD (ROUTINE X 2)  LACTIC ACID, PLASMA  STREP PNEUMONIAE URINARY ANTIGEN  LEGIONELLA ANTIGEN, URINE  TROPONIN I  TROPONIN I  TROPONIN I   Dg Chest 2 View  06/16/2012  *RADIOLOGY REPORT*  Clinical Data: Fever, rib pain  CHEST - 2 VIEW  Comparison: Prior chest x-ray 06/21/2011  Findings: Interval development of focal rounded opacity in the medial left lower lobe which projects over the heart on the frontal view, and the spine on the lateral view.  Otherwise, the lungs are clear.  No pleural effusion, pneumothorax or edema.  The cardiac and mediastinal contours remain within normal limits.  No acute osseous abnormality.  IMPRESSION: Interval development of a rounded  opacity in the medial left lower lobe.  Given the clinical history, the most likely consideration is round pneumonia.  Recommend repeat chest x-ray following appropriate course of antibiotic therapy in 4 - 6 weeks to document resolution and exclude an underlying pulmonary nodule.   Original Report Authenticated By: Jacqulynn Cadet, M.D.      1. Community acquired pneumonia   2. CAD (coronary artery disease)   3. COPD (chronic obstructive pulmonary disease)   4. Tobacco abuse  MDM  Pt presenting with fever, pain in left chest wall/rib cage, also hypoxic and tachycardic on arrival.  She is found to have leukocytosis, pneumonia on CXR.  Treated with IV abx, pain meds, antipyretics, [place] 02.  She felt much improved on recheck- discussed with inpatient team for admission.          Threasa Beards, MD 06/17/12 [phone]

## 2012-06-17 DIAGNOSIS — R079 Chest pain, unspecified: Secondary | ICD-10-CM

## 2012-06-17 DIAGNOSIS — I2510 Atherosclerotic heart disease of native coronary artery without angina pectoris: Secondary | ICD-10-CM

## 2012-06-17 DIAGNOSIS — J449 Chronic obstructive pulmonary disease, unspecified: Secondary | ICD-10-CM

## 2012-06-17 DIAGNOSIS — F17200 Nicotine dependence, unspecified, uncomplicated: Secondary | ICD-10-CM

## 2012-06-17 DIAGNOSIS — J189 Pneumonia, unspecified organism: Principal | ICD-10-CM

## 2012-06-17 LAB — COMPREHENSIVE METABOLIC PANEL
ALT: 10 U/L (ref 0–35)
Alkaline Phosphatase: 118 U/L — ABNORMAL HIGH (ref 39–117)
BUN: 30 mg/dL — ABNORMAL HIGH (ref 6–23)
CO2: 28 mEq/L (ref 19–32)
Chloride: 100 mEq/L (ref 96–112)
GFR calc Af Amer: 38 mL/min — ABNORMAL LOW (ref >90)
GFR calc non Af Amer: 33 mL/min — ABNORMAL LOW (ref >90)
Glucose, Bld: 133 mg/dL — ABNORMAL HIGH (ref 70–99)
Potassium: 3.5 mEq/L (ref 3.5–5.1)
Sodium: 137 mEq/L (ref 135–145)
Total Bilirubin: 0.3 mg/dL (ref 0.3–1.2)

## 2012-06-17 LAB — LEGIONELLA ANTIGEN, URINE: Legionella Antigen, Urine: NEGATIVE

## 2012-06-17 LAB — CBC
HCT: 38.1 % (ref 36.0–46.0)
Hemoglobin: 12.8 g/dL (ref 12.0–15.0)
RBC: 4.43 MIL/uL (ref 3.87–5.11)

## 2012-06-17 MED ORDER — ASPIRIN EC 81 MG PO TBEC
81.0000 mg | DELAYED_RELEASE_TABLET | Freq: Every day | ORAL | Status: DC
  Administered 2012-06-17: 81 mg via ORAL
  Filled 2012-06-17: qty 1

## 2012-06-17 MED ORDER — ASPIRIN EC 325 MG PO TBEC
325.0000 mg | DELAYED_RELEASE_TABLET | Freq: Every day | ORAL | Status: DC
  Administered 2012-06-17: 325 mg via ORAL
  Filled 2012-06-17 (×2): qty 1

## 2012-06-17 MED ORDER — ALBUTEROL SULFATE (5 MG/ML) 0.5% IN NEBU
2.5000 mg | INHALATION_SOLUTION | Freq: Four times a day (QID) | RESPIRATORY_TRACT | Status: DC
  Administered 2012-06-17 (×2): 2.5 mg via RESPIRATORY_TRACT
  Filled 2012-06-17 (×2): qty 0.5

## 2012-06-17 MED ORDER — METHADONE HCL 5 MG PO TABS
33.0000 mg | ORAL_TABLET | Freq: Every day | ORAL | Status: DC
  Administered 2012-06-18 – 2012-06-19 (×2): 32.5 mg via ORAL
  Filled 2012-06-17 (×2): qty 3

## 2012-06-17 MED ORDER — LABETALOL HCL 300 MG PO TABS
300.0000 mg | ORAL_TABLET | Freq: Three times a day (TID) | ORAL | Status: DC
  Administered 2012-06-17 – 2012-06-19 (×7): 300 mg via ORAL
  Filled 2012-06-17 (×9): qty 1

## 2012-06-17 MED ORDER — ALBUTEROL SULFATE (5 MG/ML) 0.5% IN NEBU
2.5000 mg | INHALATION_SOLUTION | Freq: Three times a day (TID) | RESPIRATORY_TRACT | Status: DC
  Administered 2012-06-18 – 2012-06-19 (×4): 2.5 mg via RESPIRATORY_TRACT
  Filled 2012-06-17 (×4): qty 0.5

## 2012-06-17 NOTE — Consult Note (Signed)
CARDIOLOGY CONSULT NOTE  Patient ID: Karla Nelson, MRN: [id number], DOB/AGE: [birth year] 62 y.o. Admit date: 06/16/2012 Date of Consult: 06/17/2012  Primary Physician: [name], MD Primary Cardiologist: Jenkins Rouge, MD   Reason for Consult: abnormal troponin  Patient is a 47 y.o. Female, with h/o COPD on home O2, ongoing tobacco use, CAD, polysubstance abuse, including Heroin, on chronic Methadone, who presented with L sided pleuritic CP. She is also being treated for CAP, on IV abs.   She denied anterior CP on presentation, and has not developed any since admission. She does, however, suggest exertional CP when walking up a flight of stairs, but not on level ground.  Initial troponin abnormal at 0.50. Admission EKG indicated ST 110, with LVH and NSST changes.  Past Medical History  Diagnosis Date  . HTN (hypertension)     Has normal renal arteries.  . Cleft palate   . NSTEMI (non-ST elevated myocardial infarction) 09-25-2010  . DYSPNEA ON EXERTION 03/08/2007  . ELECTROCARDIOGRAM, ABNORMAL 02/13/2010  . CAD (coronary artery disease)     NSTEMI with cath in August 2012 with nonobstructive disease, 50% ostial D1 and 70% distal LCX and PL disease. Normal EF. Has diastolic dysfunction with elevated EDP  . Tobacco abuse disorder   . COPD (chronic obstructive pulmonary disease)   . Hyperlipidemia   . Asthma        Surgical History:  Past Surgical History  Procedure Laterality Date  . Cesarean section    . Cardiac catheterization  Aug 2012    Moderate nonobstructive multivessel CAD with an EF of 70 to 75%  . Coronary angioplasty with stent placement       Home Meds: Prior to Admission medications   Medication Sig Start Date End Date Taking? Authorizing Provider  albuterol (PROAIR HFA) 108 (90 BASE) MCG/ACT inhaler Inhale 2 puffs into the lungs every 6 (six) hours as needed. Shortness of breath   Yes Historical Provider, MD  albuterol (PROVENTIL) (2.5 MG/3ML) 0.083%  nebulizer solution Take 3 mLs (2.5 mg total) by nebulization every 6 (six) hours as needed for wheezing. 06/21/11 06/20/12 Yes Julieta Bellini, NP  cloNIDine (CATAPRES) 0.1 MG tablet Take 0.1 mg by mouth 2 (two) times daily.  11/11/11  Yes Historical Provider, MD  hydrALAZINE (APRESOLINE) 50 MG tablet Take 50 mg by mouth every 6 (six) hours as needed (for high Blood Pressure).   Yes Historical Provider, MD  hydrochlorothiazide (HYDRODIURIL) 50 MG tablet Take 1 tablet (50 mg total) by mouth daily. 12/11/11  Yes Burtis Junes, NP  labetalol (NORMODYNE) 300 MG tablet Take 300 mg by mouth 3 (three) times daily.   Yes Historical Provider, MD  lisinopril (PRINIVIL,ZESTRIL) 40 MG tablet Take 40 mg by mouth at bedtime.   Yes Historical Provider, MD  losartan (COZAAR) 100 MG tablet Take 1 tablet (100 mg total) by mouth daily. 11/04/11  Yes Tammy S Parrett, NP  METHADONE HCL PO Take 33 mg by mouth every morning.    Yes Historical Provider, MD  nitroGLYCERIN (NITROSTAT) 0.4 MG SL tablet Place 0.4 mg under the tongue every 5 (five) minutes as needed for chest pain.   Yes Historical Provider, MD  Omega-3 Fatty Acids (FISH OIL BURP-LESS PO) Take 1 capsule by mouth daily.    Yes Historical Provider, MD  tiotropium (SPIRIVA) 18 MCG inhalation capsule Place 1 capsule (18 mcg total) into inhaler and inhale daily. 11/04/11  Yes Melvenia Needles, NP    Inpatient Medications:  . albuterol  2.5 mg Nebulization Q6H  . aspirin EC  325 mg Oral Daily  . azithromycin  500 mg Intravenous Q24H  . cefTRIAXone (ROCEPHIN)  IV  1 g Intravenous Q24H  . cloNIDine  0.1 mg Oral BID  . enoxaparin (LOVENOX) injection  40 mg Subcutaneous QHS  . hydrALAZINE  25 mg Oral Q8H  . labetalol  300 mg Oral TID  . [START ON 06/18/2012] methadone  32.5 mg Oral Daily  . tiotropium  18 mcg Inhalation Daily    Allergies: No Known Allergies  History   Social History  . Marital Status: Legally Separated    Spouse Name: N/A    Number of Children:  N/A  . Years of Education: N/A   Occupational History  . Not on file.   Social History Main Topics  . Smoking status: Current Every Day Smoker -- 0.50 packs/day for 31 years    Types: Cigarettes  . Smokeless tobacco: Never Used     Comment: 4 CIGS A DAY  . Alcohol Use: No  . Drug Use: No     Comment: former drug use  . Sexually Active: Yes   Other Topics Concern  . Not on file   Social History Narrative  . No narrative on file     Family History  Problem Relation Age of Onset  . Diabetes    . Hypertension    . Asthma Mother   . Asthma Brother   . Heart disease Mother      Review of Systems: General: negative for chills, fever, night sweats or weight changes.  Cardiovascular: reports CP when walking up a flight of stairs, but not on level ground Dermatological: negative for rash Respiratory: negative for cough or wheezing Urologic: negative for hematuria Abdominal: negative for nausea, vomiting, diarrhea, bright red blood per rectum, melena, or hematemesis Neurologic: negative for visual changes, syncope, or dizziness All other systems reviewed and are otherwise negative except as noted above.  Labs:  Recent Labs  06/17/12 1222  TROPONINI 0.50*   Lab Results  Component Value Date   WBC 26.4* 06/17/2012   HGB 12.8 06/17/2012   HCT 38.1 06/17/2012   MCV 86.0 06/17/2012   PLT 214 06/17/2012    Recent Labs Lab 06/17/12 0350  NA 137  K 3.5  CL 100  CO2 28  BUN 30*  CREATININE 1.76*  CALCIUM 8.0*  PROT 6.5  BILITOT 0.3  ALKPHOS 118*  ALT 10  AST 13  GLUCOSE 133*   Lab Results  Component Value Date   CHOL 138 09/25/2010   HDL 36* 09/25/2010   LDLCALC 86 09/25/2010   TRIG 79 09/25/2010   Lab Results  Component Value Date   DDIMER  Value: 0.33        AT THE INHOUSE ESTABLISHED CUTOFF VALUE OF 0.48 ug/mL FEU, THIS ASSAY HAS BEEN DOCUMENTED IN THE LITERATURE TO HAVE A SENSITIVITY AND NEGATIVE PREDICTIVE VALUE OF AT LEAST 98 TO 99%.  THE TEST RESULT SHOULD  BE CORRELATED WITH AN ASSESSMENT OF THE CLINICAL PROBABILITY OF DVT / VTE. 02/22/2008    Radiology/Studies:  Dg Chest 2 View  06/16/2012  *RADIOLOGY REPORT*  Clinical Data: Fever, rib pain  CHEST - 2 VIEW  Comparison: Prior chest x-ray 06/21/2011  Findings: Interval development of focal rounded opacity in the medial left lower lobe which projects over the heart on the frontal view, and the spine on the lateral view.  Otherwise, the lungs are clear.  No pleural effusion, pneumothorax  or edema.  The cardiac and mediastinal contours remain within normal limits.  No acute osseous abnormality.  IMPRESSION: Interval development of a rounded opacity in the medial left lower lobe.  Given the clinical history, the most likely consideration is round pneumonia.  Recommend repeat chest x-ray following appropriate course of antibiotic therapy in 4 - 6 weeks to document resolution and exclude an underlying pulmonary nodule.   Original Report Authenticated By: Jacqulynn Cadet, M.D.     EKG: on Admission, ST 110; LAD; NSST changes  Physical Exam: Blood pressure 112/67, pulse 77, temperature 98.4 F (36.9 C), temperature source Axillary, resp. rate 16, height 5\' 5"  (1.651 m), weight 188 lb 11.4 oz (85.6 kg), last menstrual period 06/13/2012, SpO2 98.00%. General: Well developed, well nourished, in no acute distress. Head: Normocephalic, atraumatic, sclera non-icteric, no xanthomas, nares are without discharge.  Neck: Negative for carotid bruits. JVD not elevated. Lungs: diminished BS, faint expiratory wheezes. Heart: RRR with S1 S2. No murmurs, rubs, or gallops appreciated. Abdomen: Soft, non-tender, non-distended with normoactive bowel sounds. No hepatomegaly. No rebound/guarding. No obvious abdominal masses. Msk:  Strength and tone appear normal for age. Extremities: No clubbing or cyanosis. No edema.  Distal pedal pulses are 2+ and equal bilaterally. Neuro: Alert and oriented X 3. Moves all extremities  spontaneously. Psych:  Responds to questions appropriately with a normal affect.     Assessment and Plan:  1 NSTEMI (Type II)  - in settining of Pneumonia, tachycardia  2 CAD  - moderate, by cardiac catheterization, 09/2010  - s/p NSTEMI, in setting of malignant HTN  - EF 70-75%  3 Polysubstance abuse  - h/o Heroin use, on chronic Methadone  4 HTN  5 Renal Insufficiency  PLAN: Recommendation is to continue cycling troponins, and repeat EKG in AM. Do not recommend treatment with heparin or Plavix. Will order echo for AM, for reassessment of LVF and r/o of underlying structural abnormalities. No current plans for cardiac catheterization, unless troponins were to trend upward. Suspect this represents enzyme leak in setting of pneumonia and tachycardia, rather than ACS. Patient also at increased risk for CIN, given underlying renal disease.   Signed, SERPE, EUGENE PA-C 06/17/2012, 4:25 PM Agree with assessment and plan as noted by Mr. Serpe PA-C above.  I personally examined the patient. Her pain is located along the left lower anterolateral rib cage and does not suggest angina to me. The physical exam does not reveal any pleural friction rub.  EKG shows only nonspecific ST-T abnormalities but no ischemic changes. Borderline troponin suggests probable mild enzyme leak secondary to stress of pneumonia. Will follow with you.

## 2012-06-17 NOTE — Progress Notes (Signed)
PATIENT DETAILS Name: Karla Nelson Age: 47 y.o. Sex: female Date of Birth: 1965/10/23 Admit Date: 06/16/2012 Admitting Physician Ripudeep Krystal Eaton, MD MR:[id number] C, MD  Subjective: Feels better-left post chest pain much better-mostly pleuritic  Assessment/Plan: Active Problems: Community Acquired pneumonia -Fever of 102.6 on admission, Still with significant leukocytosis -does not look toxic-claims to feel better today -c/w Rocephin and Zithromax -blood cultures on 5/9-pending  Acute on CKD Stage 2 -creatinine slightly worse today-suspect prenal -stop HCTZ -continue to hold Losartan and NSAID's -c/w IVF-recheck lytes in am    HYPERTENSION -c/w Hydralazine, Labetolol and Clonidine  COPD-on Home 02 -lungs mostly clear-with a few scattered rhonchi -will place on scheduled albuterol nebs  Pleuritic L sided chest pain -likely 2/2 PNA -will check one set of troponin today-given h/o CAD -clinically better-no indication for CT imaging at this point  CAD:  -stable -chest pain more pleuritic-and consistent with PNA-check one set of troponin today -add ASA 81 mg, continue labetalol -LHC in 02/13/2010-showed Non obst CAD  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 azithromycin  500 mg Intravenous Q24H  . cefTRIAXone (ROCEPHIN)  IV  1 g Intravenous Q24H  . cloNIDine  0.1 mg Oral BID  . enoxaparin (LOVENOX) injection  40 mg Subcutaneous QHS  . hydrALAZINE  25 mg Oral Q8H  . labetalol  300 mg Oral TID  . methadone  30 mg Oral Daily  . tiotropium  18 mcg Inhalation Daily   Continuous Infusions: . sodium chloride 100 mL/hr at 06/16/12 1516    PRN Meds:.acetaminophen, albuterol, morphine injection, nitroGLYCERIN, ondansetron, ondansetron (ZOFRAN) IV  Antibiotics: Anti-infectives   Start     Dose/Rate Route Frequency Ordered Stop   06/17/12 1200  azithromycin (ZITHROMAX) 500 mg in dextrose 5 % 250 mL IVPB     500 mg 250 mL/hr over 60 Minutes Intravenous Every 24 hours 06/16/12 1456     06/17/12 1100  cefTRIAXone (ROCEPHIN) 1 g in dextrose 5 % 50 mL IVPB     1 g 100 mL/hr over 30 Minutes Intravenous Every 24 hours 06/16/12 1456     06/16/12 1045  cefTRIAXone (ROCEPHIN) 1 g in dextrose 5 % 50 mL IVPB     1 g 100 mL/hr over 30 Minutes Intravenous  Once 06/16/12 1030 06/16/12 1137   06/16/12 1045  azithromycin (ZITHROMAX) 500 mg in dextrose 5 % 250 mL IVPB     500 mg 250 mL/hr over 60 Minutes Intravenous  Once 06/16/12 1030 06/16/12 1237       PHYSICAL EXAM: Vital signs in last 24 hours: Filed Vitals:   06/17/12 0138 06/17/12 0528 06/17/12 0747 06/17/12 1041  BP:  106/65  149/76  Pulse:  76    Temp:  99.2 F (37.3 C)    TempSrc:  Oral    Resp:  20    Height:      Weight:      SpO2: 96% 95% 91%     Weight change:  Filed Weights   06/16/12 1449  Weight: 85.6 kg (188 lb 11.4 oz)   Body mass index is 31.4 kg/(m^2).   Gen Exam: Awake and alert with clear speech.   Neck: Supple, No JVD.   Chest: B/L Clear-except  few scattered rhonchi CVS: S1 S2 Regular, no murmurs.  Abdomen: soft, BS +, non tender, non distended.  Extremities: no edema, lower extremities warm to touch. Neurologic: Non Focal.   Skin: No Rash.   Wounds: N/A.    Intake/Output from previous day:  Intake/Output Summary (Last 24 hours) at 06/17/12 1133 Last data filed at 06/17/12 1045  Gross per 24 hour  Intake 2483.33 ml  Output      0 ml  Net 2483.33 ml     LAB RESULTS: CBC  Recent Labs Lab 06/16/12 0930 06/17/12 0350  WBC 26.4* 26.4*  HGB 15.2* 12.8  HCT 43.4 38.1  PLT 257 214  MCV 83.6 86.0  MCH 29.3 28.9  MCHC 35.0 33.6   RDW 13.7 14.4    Chemistries   Recent Labs Lab 06/16/12 0930 06/17/12 0350  NA 135 137  K 3.7 3.5  CL 95* 100  CO2 29 28  GLUCOSE 156* 133*  BUN 26* 30*  CREATININE 1.55* 1.76*  CALCIUM 9.7 8.0*    CBG: No results found for this basename: GLUCAP,  in the last 168 hours  GFR Estimated Creatinine Clearance: 42.2 ml/min (by C-G formula based on Cr of 1.76).  Coagulation profile No results found for this basename: INR, PROTIME,  in the last 168 hours  Cardiac Enzymes No results found for this basename: CK, CKMB, TROPONINI, MYOGLOBIN,  in the last 168 hours  No components found with this basename: POCBNP,  No results found for this basename: DDIMER,  in the last 72 hours No results found for this basename: HGBA1C,  in the last 72 hours No results found for this basename: CHOL, HDL, LDLCALC, TRIG, CHOLHDL, LDLDIRECT,  in the last 72 hours No results found for this basename: TSH, T4TOTAL, FREET3, T3FREE, THYROIDAB,  in the last 72 hours No results found for this basename: VITAMINB12, FOLATE, FERRITIN, TIBC, IRON, RETICCTPCT,  in the last 72 hours No results found for this basename: LIPASE, AMYLASE,  in the last 72 hours  Urine Studies No results found for this basename: UACOL, UAPR, USPG, UPH, UTP, UGL, UKET, UBIL, UHGB, UNIT, UROB, ULEU, UEPI, UWBC, URBC, UBAC, CAST, CRYS, UCOM, BILUA,  in the last 72 hours  MICROBIOLOGY: No results found for this or any previous visit (from the past 240 hour(s)).  RADIOLOGY STUDIES/RESULTS: Dg Chest 2 View  06/16/2012  *RADIOLOGY REPORT*  Clinical Data: Fever, rib pain  CHEST - 2 VIEW  Comparison: Prior chest x-ray 06/21/2011  Findings: Interval development of focal rounded opacity in the medial left lower lobe which projects over the heart on the frontal view, and the spine on the lateral view.  Otherwise, the lungs are clear.  No pleural effusion, pneumothorax or edema.  The cardiac and mediastinal contours remain within normal limits.   No acute osseous abnormality.  IMPRESSION: Interval development of a rounded opacity in the medial left lower lobe.  Given the clinical history, the most likely consideration is round pneumonia.  Recommend repeat chest x-ray following appropriate course of antibiotic therapy in 4 - 6 weeks to document resolution and exclude an underlying pulmonary nodule.   Original Report Authenticated By: Jacqulynn Cadet, M.D.    Dg Lumbar Spine Complete  05/22/2012  *RADIOLOGY REPORT*  Clinical Data: Low back pain for 6 weeks  LUMBAR SPINE - COMPLETE 4+ VIEW  Comparison: None.  Findings: Five lumbar-type vertebral bodies show normal alignment. There is disc space narrowing at the L5-S1 level consistent with chronic disc degeneration.  There is mild facet degeneration at L4- 5 and L5-S1.  No pars defect or slippage.  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  IMPRESSION: Degenerative disc disease at L5-S1 with disc space narrowing.  Lower lumbar facet degeneration.  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   Original Report Authenticated By: Nelson Chimes, M.D.     Oren Binet, MD  [facility] Pager:[phone]  If 7PM-7AM, please contact night-coverage [email or web address] Password [email or web address] 06/17/2012, 11:33 AM   LOS: 1 day

## 2012-06-17 NOTE — Progress Notes (Signed)
Utilization Review Completed.   Jamahl Lemmons, RN, BSN Nurse Case Manager  [phone]  

## 2012-06-18 DIAGNOSIS — I214 Non-ST elevation (NSTEMI) myocardial infarction: Secondary | ICD-10-CM

## 2012-06-18 DIAGNOSIS — I10 Essential (primary) hypertension: Secondary | ICD-10-CM

## 2012-06-18 DIAGNOSIS — M7989 Other specified soft tissue disorders: Secondary | ICD-10-CM

## 2012-06-18 LAB — COMPREHENSIVE METABOLIC PANEL
ALT: 11 U/L (ref 0–35)
AST: 14 U/L (ref 0–37)
Alkaline Phosphatase: 104 U/L (ref 39–117)
BUN: 25 mg/dL — ABNORMAL HIGH (ref 6–23)
CO2: 28 mEq/L (ref 19–32)
Calcium: 8.5 mg/dL (ref 8.4–10.5)
Chloride: 100 mEq/L (ref 96–112)
Creatinine, Ser: 1.46 mg/dL — ABNORMAL HIGH (ref 0.50–1.10)
GFR calc Af Amer: 48 mL/min — ABNORMAL LOW (ref >90)
Total Protein: 6.6 g/dL (ref 6.0–8.3)

## 2012-06-18 LAB — DIFFERENTIAL
Basophils Relative: 0 % (ref 0–1)
Eosinophils Relative: 1 % (ref 0–5)
Monocytes Absolute: 0.8 10*3/uL (ref 0.1–1.0)
Monocytes Relative: 6 % (ref 3–12)
Neutro Abs: 10.4 10*3/uL — ABNORMAL HIGH (ref 1.7–7.7)

## 2012-06-18 LAB — CBC
HCT: 37.6 % (ref 36.0–46.0)
Hemoglobin: 12.2 g/dL (ref 12.0–15.0)
MCH: 28.3 pg (ref 26.0–34.0)
MCHC: 32.4 g/dL (ref 30.0–36.0)
MCV: 87.2 fL (ref 78.0–100.0)

## 2012-06-18 LAB — URINE CULTURE: Colony Count: 50000

## 2012-06-18 LAB — TROPONIN I: Troponin I: 0.79 ng/mL (ref <0.30)

## 2012-06-18 MED ORDER — ASPIRIN EC 81 MG PO TBEC
81.0000 mg | DELAYED_RELEASE_TABLET | Freq: Every day | ORAL | Status: DC
  Administered 2012-06-18 – 2012-06-19 (×2): 81 mg via ORAL
  Filled 2012-06-18 (×2): qty 1

## 2012-06-18 NOTE — Progress Notes (Signed)
PATIENT DETAILS Name: Karla Nelson Age: 47 y.o. Sex: female Date of Birth: 1965-09-04 Admit Date: 06/16/2012 Admitting Physician Domenic Polite, MD MR:[id number] C, MD  Subjective: Continues to improve.Left post chest pain much better-mostly pleuritic  Assessment/Plan: Active Problems: Community Acquired pneumonia -Fever of 102.6 on admission and did significant leukocytosis on admission, CXR confirmed inflitrate-started on empiric Rocephin and Zithromax -does not look toxic-clinically better today -c/w Rocephin and Zithromax -blood cultures on 5/9-negative  NSTEMI-type 2 -likely more of demand ischemia -cards following -awaiting Echo -on ASA, statins and Beta blockers  Acute on CKD Stage 2 -Found to have acute on CKD-creatinine worsened on 5/10-stopped HCTZ on 5/10 and continued IVF hydration -suspect prenal etiology -Creatinine better 5/11 -continue to hold Losartan, HCTZ and NSAID's -decrease IVF to 50 cc/hr-recheck lytes in am    HYPERTENSION -moderately controlled -c/w Hydralazine, Labetolol and Clonidine  COPD-on Home 02 -lungs mostly clear-with a few scattered rhonchi -c/w scheduled albuterol nebs  Pleuritic L sided chest pain -likely 2/2 PNA -clinically better-no indication for CT imaging at this point  CAD:  -stable -chest pain more pleuritic-and consistent with PNA-although troponin elevated-suspect more of a demand ischemia. 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 albuterol  2.5 mg Nebulization TID  . aspirin EC   81 mg Oral Daily  . azithromycin  500 mg Intravenous Q24H  . cefTRIAXone (ROCEPHIN)  IV  1 g Intravenous Q24H  . cloNIDine  0.1 mg Oral BID  . enoxaparin (LOVENOX) injection  40 mg Subcutaneous QHS  . hydrALAZINE  25 mg Oral Q8H  . labetalol  300 mg Oral TID  . methadone  32.5 mg Oral Daily  . tiotropium  18 mcg Inhalation Daily   Continuous Infusions: . sodium chloride 100 mL/hr at 06/18/12 0137   PRN Meds:.acetaminophen, albuterol, morphine injection, nitroGLYCERIN, ondansetron, ondansetron (ZOFRAN) IV  Antibiotics: Anti-infectives   Start     Dose/Rate Route Frequency Ordered Stop   06/17/12 1200  azithromycin (ZITHROMAX) 500 mg in dextrose 5 % 250 mL IVPB     500 mg 250 mL/hr over 60 Minutes Intravenous Every 24 hours 06/16/12 1456     06/17/12 1100  cefTRIAXone (ROCEPHIN) 1 g in dextrose 5 % 50 mL IVPB     1 g 100 mL/hr over 30 Minutes Intravenous Every 24 hours 06/16/12 1456     06/16/12 1045  cefTRIAXone (ROCEPHIN) 1 g in dextrose 5 % 50 mL IVPB     1 g 100 mL/hr over 30 Minutes Intravenous  Once 06/16/12 1030 06/16/12 1137   06/16/12 1045  azithromycin (ZITHROMAX) 500 mg in dextrose 5 % 250 mL IVPB     500 mg 250 mL/hr over 60 Minutes Intravenous  Once 06/16/12 1030 06/16/12 1237       PHYSICAL EXAM: Vital signs in last 24 hours: Filed Vitals:   06/18/12 0450 06/18/12 0601 06/18/12 0950 06/18/12 0956  BP: 153/78 132/83 152/88   Pulse: 91 76    Temp: 98.2 F (36.8 C)     TempSrc: Oral     Resp: 20     Height:      Weight:  SpO2: 93%   94%    Weight change:  Filed Weights   06/16/12 1449  Weight: 85.6 kg (188 lb 11.4 oz)   Body mass index is 31.4 kg/(m^2).   Gen Exam: Awake and alert with clear speech.   Neck: Supple, No JVD.   Chest: B/L Clear CVS: S1 S2 Regular, no murmurs.  Abdomen: soft, BS +, non tender, non distended.  Extremities: no edema, lower extremities warm to touch. Neurologic: Non Focal.   Skin: No Rash.   Wounds: N/A.     Intake/Output from previous day:  Intake/Output Summary (Last 24 hours) at 06/18/12 1150 Last data filed at 06/18/12 0600  Gross per 24 hour  Intake   2580 ml  Output    600 ml  Net   1980 ml     LAB RESULTS: CBC  Recent Labs Lab 06/16/12 0930 06/17/12 0350 06/18/12 0520  WBC 26.4* 26.4* 13.3*  HGB 15.2* 12.8 12.2  HCT 43.4 38.1 37.6  PLT 257 214 246  MCV 83.6 86.0 87.2  MCH 29.3 28.9 28.3  MCHC 35.0 33.6 32.4  RDW 13.7 14.4 14.7  LYMPHSABS  --   --  2.0  MONOABS  --   --  0.8  EOSABS  --   --  0.1  BASOSABS  --   --  0.0    Chemistries   Recent Labs Lab 06/16/12 0930 06/17/12 0350 06/18/12 0520  NA 135 137 137  K 3.7 3.5 3.7  CL 95* 100 100  CO2 29 28 28   GLUCOSE 156* 133* 155*  BUN 26* 30* 25*  CREATININE 1.55* 1.76* 1.46*  CALCIUM 9.7 8.0* 8.5    CBG: No results found for this basename: GLUCAP,  in the last 168 hours  GFR Estimated Creatinine Clearance: 50.9 ml/min (by C-G formula based on Cr of 1.46).  Coagulation profile No results found for this basename: INR, PROTIME,  in the last 168 hours  Cardiac Enzymes  Recent Labs Lab 06/17/12 1611 06/17/12 2205 06/18/12 0520  TROPONINI 0.53* 0.59* 0.79*    No components found with this basename: POCBNP,  No results found for this basename: DDIMER,  in the last 72 hours No results found for this basename: HGBA1C,  in the last 72 hours No results found for this basename: CHOL, HDL, LDLCALC, TRIG, CHOLHDL, LDLDIRECT,  in the last 72 hours No results found for this basename: TSH, T4TOTAL, FREET3, T3FREE, THYROIDAB,  in the last 72 hours No results found for this basename: VITAMINB12, FOLATE, FERRITIN, TIBC, IRON, RETICCTPCT,  in the last 72 hours No results found for this basename: LIPASE, AMYLASE,  in the last 72 hours  Urine Studies No results found for this basename: UACOL, UAPR, USPG, UPH, UTP, UGL, UKET, UBIL, UHGB, UNIT, UROB, ULEU, UEPI, UWBC, URBC, UBAC, CAST, CRYS, UCOM, BILUA,  in  the last 72 hours  MICROBIOLOGY: Recent Results (from the past 240 hour(s))  CULTURE, BLOOD (ROUTINE X 2)     Status: None   Collection Time    06/16/12 10:20 AM      Result Value Range Status   Specimen Description BLOOD LEFT HAND   Final   Special Requests BOTTLES DRAWN AEROBIC AND ANAEROBIC 10CC   Final   Culture  Setup Time 06/16/2012 18:03   Final   Culture     Final   Value:        BLOOD CULTURE RECEIVED NO GROWTH TO DATE CULTURE WILL BE HELD FOR 5 DAYS BEFORE ISSUING  A FINAL NEGATIVE REPORT   Report Status PENDING   Incomplete  CULTURE, BLOOD (ROUTINE X 2)     Status: None   Collection Time    06/16/12 10:28 AM      Result Value Range Status   Specimen Description BLOOD LEFT ARM   Final   Special Requests BOTTLES DRAWN AEROBIC AND ANAEROBIC 10CC   Final   Culture  Setup Time 06/16/2012 17:59   Final   Culture     Final   Value:        BLOOD CULTURE RECEIVED NO GROWTH TO DATE CULTURE WILL BE HELD FOR 5 DAYS BEFORE ISSUING A FINAL NEGATIVE REPORT   Report Status PENDING   Incomplete  URINE CULTURE     Status: None   Collection Time    06/16/12 11:25 AM      Result Value Range Status   Specimen Description URINE, CLEAN CATCH   Final   Special Requests NONE   Final   Culture  Setup Time 06/16/2012 17:51   Final   Colony Count 50,000 COLONIES/ML   Final   Culture PROTEUS MIRABILIS   Final   Report Status PENDING   Incomplete    RADIOLOGY STUDIES/RESULTS: Dg Chest 2 View  06/16/2012  *RADIOLOGY REPORT*  Clinical Data: Fever, rib pain  CHEST - 2 VIEW  Comparison: Prior chest x-ray 06/21/2011  Findings: Interval development of focal rounded opacity in the medial left lower lobe which projects over the heart on the frontal view, and the spine on the lateral view.  Otherwise, the lungs are clear.  No pleural effusion, pneumothorax or edema.  The cardiac and mediastinal contours remain within normal limits.  No acute osseous abnormality.  IMPRESSION: Interval development of a rounded  opacity in the medial left lower lobe.  Given the clinical history, the most likely consideration is round pneumonia.  Recommend repeat chest x-ray following appropriate course of antibiotic therapy in 4 - 6 weeks to document resolution and exclude an underlying pulmonary nodule.   Original Report Authenticated By: Jacqulynn Cadet, M.D.    Dg Lumbar Spine Complete  05/22/2012  *RADIOLOGY REPORT*  Clinical Data: Low back pain for 6 weeks  LUMBAR SPINE - COMPLETE 4+ VIEW  Comparison: None.  Findings: Five lumbar-type vertebral bodies show normal alignment. There is disc space narrowing at the L5-S1 level consistent with chronic disc degeneration.  There is mild facet degeneration at L4- 5 and L5-S1.  No pars defect or slippage.  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  IMPRESSION: Degenerative disc disease at L5-S1 with disc space narrowing.  Lower lumbar facet degeneration.  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   Original Report Authenticated By: Nelson Chimes, M.D.     Oren Binet, MD  [facility] Pager:[phone]  If 7PM-7AM, please contact night-coverage [email or web address] Password [email or web address] 06/18/2012, 11:50 AM   LOS: 2 days

## 2012-06-18 NOTE — Progress Notes (Signed)
Patient: Karla Nelson Date of Encounter: 06/18/2012, 7:53 AM Admit date: 06/16/2012     Subjective  Feeling better, less pleuritic CP. Continues to deny frank anterior CP   Objective   Telemetry: NSR Physical Exam: Filed Vitals:   06/18/12 0601  BP: 132/83  Pulse: 76  Temp:   Resp:    General: Well developed, well nourished, in no acute distress. Head: Normocephalic, atraumatic, sclera non-icteric, no xanthomas, nares are without discharge.  Neck: Negative for carotid bruits. JVD not elevated. Lungs: Clear bilaterally to auscultation without wheezes, rales, or rhonchi. Breathing is unlabored. Heart: RRR S1 S2 without murmurs, rubs, or gallops.  Abdomen: Soft, non-tender, non-distended with normoactive bowel sounds. No hepatomegaly. No rebound/guarding. No obvious abdominal masses. Msk:  Strength and tone appear normal for age. Extremities: No peripheral edema.   Neuro: Alert and oriented X 3. Moves all extremities spontaneously. Psych:  Responds to questions appropriately with a normal affect.    Intake/Output Summary (Last 24 hours) at 06/18/12 0753 Last data filed at 06/18/12 0600  Gross per 24 hour  Intake   2990 ml  Output    600 ml  Net   2390 ml    Inpatient Medications:  . albuterol  2.5 mg Nebulization TID  . aspirin EC  325 mg Oral Daily  . azithromycin  500 mg Intravenous Q24H  . cefTRIAXone (ROCEPHIN)  IV  1 g Intravenous Q24H  . cloNIDine  0.1 mg Oral BID  . enoxaparin (LOVENOX) injection  40 mg Subcutaneous QHS  . hydrALAZINE  25 mg Oral Q8H  . labetalol  300 mg Oral TID  . methadone  32.5 mg Oral Daily  . tiotropium  18 mcg Inhalation Daily    Labs:  Recent Labs  06/17/12 0350 06/18/12 0520  NA 137 137  K 3.5 3.7  CL 100 100  CO2 28 28  GLUCOSE 133* 155*  BUN 30* 25*  CREATININE 1.76* 1.46*  CALCIUM 8.0* 8.5    Recent Labs  06/17/12 0350 06/18/12 0520  AST 13 14  ALT 10 11  ALKPHOS 118* 104  BILITOT 0.3 0.1*  PROT 6.5 6.6    ALBUMIN 2.6* 2.7*   No results found for this basename: LIPASE, AMYLASE,  in the last 72 hours  Recent Labs  06/17/12 0350 06/18/12 0520  WBC 26.4* 13.3*  NEUTROABS  --  10.4*  HGB 12.8 12.2  HCT 38.1 37.6  MCV 86.0 87.2  PLT 214 246    Recent Labs  06/17/12 1222 06/17/12 1611 06/17/12 2205  TROPONINI 0.50* 0.53* 0.59*   No components found with this basename: POCBNP,  No results found for this basename: DDIMER,  in the last 72 hours No results found for this basename: HGBA1C,  in the last 72 hours No results found for this basename: CHOL, HDL, LDLCALC, TRIG, CHOLHDL,  in the last 72 hours No results found for this basename: TSH, T4TOTAL, FREET3, T3FREE, THYROIDAB,  in the last 72 hours No results found for this basename: VITAMINB12, FOLATE, FERRITIN, TIBC, IRON, RETICCTPCT,  in the last 72 hours  Radiology/Studies:  Dg Chest 2 View  06/16/2012  *RADIOLOGY REPORT*  Clinical Data: Fever, rib pain  CHEST - 2 VIEW  Comparison: Prior chest x-ray 06/21/2011  Findings: Interval development of focal rounded opacity in the medial left lower lobe which projects over the heart on the frontal view, and the spine on the lateral view.  Otherwise, the lungs are clear.  No pleural effusion, pneumothorax or  edema.  The cardiac and mediastinal contours remain within normal limits.  No acute osseous abnormality.  IMPRESSION: Interval development of a rounded opacity in the medial left lower lobe.  Given the clinical history, the most likely consideration is round pneumonia.  Recommend repeat chest x-ray following appropriate course of antibiotic therapy in 4 - 6 weeks to document resolution and exclude an underlying pulmonary nodule.   Original Report Authenticated By: Jacqulynn Cadet, M.D.         Assessment and Plan  1 NSTEMI (Type II)   - in settining of Pneumonia, tachycardia  - nondiagnostic, flat pattern troponins  - pleuritic CP 2 CAD   - moderate, by cardiac catheterization, 09/2010    - s/p NSTEMI, in setting of malignant HTN   - EF 70-75%  3 Pneumonia, on IV abs 3 Polysubstance abuse   - h/o Heroin use, on chronic Methadone  4 HTN  5 Renal Insufficiency, creatinine trending down   PLAN: f/u echo today. If unchanged from prior study, then no further w/u indicated. Reduce ASA to 81 daily.  Signed, SERPE, EUGENE PA-C  Agree with above.  No new findings on physical exam.  Still tender on palpation of left lower rib cage. Echo pending.

## 2012-06-18 NOTE — Progress Notes (Signed)
VASCULAR LAB PRELIMINARY  PRELIMINARY  PRELIMINARY  PRELIMINARY  Bilateral lower extremity venous duplex  completed.    Preliminary report:  Bilateral:  No evidence of DVT, superficial thrombosis, or Baker's Cyst.    Edmonia Gonser, RVT 06/18/2012, 2:07 PM

## 2012-06-19 DIAGNOSIS — N179 Acute kidney failure, unspecified: Secondary | ICD-10-CM | POA: Diagnosis present

## 2012-06-19 DIAGNOSIS — I214 Non-ST elevation (NSTEMI) myocardial infarction: Secondary | ICD-10-CM | POA: Diagnosis not present

## 2012-06-19 DIAGNOSIS — I517 Cardiomegaly: Secondary | ICD-10-CM

## 2012-06-19 DIAGNOSIS — N189 Chronic kidney disease, unspecified: Secondary | ICD-10-CM | POA: Diagnosis present

## 2012-06-19 LAB — CBC
HCT: 37.9 % (ref 36.0–46.0)
Hemoglobin: 12.3 g/dL (ref 12.0–15.0)
MCH: 28.3 pg (ref 26.0–34.0)
MCHC: 32.5 g/dL (ref 30.0–36.0)
MCV: 87.3 fL (ref 78.0–100.0)
RDW: 14.4 % (ref 11.5–15.5)

## 2012-06-19 LAB — BASIC METABOLIC PANEL
BUN: 17 mg/dL (ref 6–23)
Calcium: 9 mg/dL (ref 8.4–10.5)
Creatinine, Ser: 1.19 mg/dL — ABNORMAL HIGH (ref 0.50–1.10)
GFR calc Af Amer: 62 mL/min — ABNORMAL LOW (ref >90)
GFR calc non Af Amer: 53 mL/min — ABNORMAL LOW (ref >90)
Glucose, Bld: 183 mg/dL — ABNORMAL HIGH (ref 70–99)
Potassium: 3.8 mEq/L (ref 3.5–5.1)

## 2012-06-19 MED ORDER — ALBUTEROL SULFATE HFA 108 (90 BASE) MCG/ACT IN AERS: 2.0000 | INHALATION_SPRAY | Freq: Four times a day (QID) | RESPIRATORY_TRACT | Status: DC | PRN

## 2012-06-19 MED ORDER — NICOTINE 14 MG/24HR TD PT24: 1.0000 | MEDICATED_PATCH | TRANSDERMAL | Status: DC

## 2012-06-19 MED ORDER — LOSARTAN POTASSIUM 100 MG PO TABS: 100.0000 mg | ORAL_TABLET | Freq: Every day | ORAL | Status: DC

## 2012-06-19 MED ORDER — ASPIRIN 81 MG PO TBEC: 81.0000 mg | DELAYED_RELEASE_TABLET | Freq: Every day | ORAL | Status: DC

## 2012-06-19 MED ORDER — LABETALOL HCL 300 MG PO TABS: 300.0000 mg | ORAL_TABLET | Freq: Three times a day (TID) | ORAL | Status: DC

## 2012-06-19 MED ORDER — LOSARTAN POTASSIUM 50 MG PO TABS
100.0000 mg | ORAL_TABLET | Freq: Every day | ORAL | Status: DC
  Administered 2012-06-19: 100 mg via ORAL
  Filled 2012-06-19: qty 2

## 2012-06-19 MED ORDER — HYDRALAZINE HCL 50 MG PO TABS: 25.0000 mg | ORAL_TABLET | Freq: Three times a day (TID) | ORAL | Status: DC

## 2012-06-19 MED ORDER — LEVOFLOXACIN 750 MG PO TABS: 750.0000 mg | ORAL_TABLET | Freq: Every day | ORAL | Status: DC

## 2012-06-19 MED ORDER — TIOTROPIUM BROMIDE MONOHYDRATE 18 MCG IN CAPS: 18.0000 ug | ORAL_CAPSULE | Freq: Every day | RESPIRATORY_TRACT | Status: DC

## 2012-06-19 NOTE — Progress Notes (Signed)
Subjective:  Feels better. Less chest discomfort.  Objective:  Vital Signs in the last 24 hours: Temp:  [97.9 F (36.6 C)-98.6 F (37 C)] 98.6 F (37 C) (05/12 0500) Pulse Rate:  [76-86] 84 (05/12 0500) Resp:  [18-20] 20 (05/12 0500) BP: (117-178)/(68-92) 178/92 mmHg (05/12 0500) SpO2:  [86 %-99 %] 91 % (05/12 0500)  Intake/Output from previous day: 05/11 0701 - 05/12 0700 In: 1215 [I.V.:915; IV Piggyback:300] Out: -  Intake/Output from this shift:    . albuterol  2.5 mg Nebulization TID  . aspirin EC  81 mg Oral Daily  . azithromycin  500 mg Intravenous Q24H  . cefTRIAXone (ROCEPHIN)  IV  1 g Intravenous Q24H  . cloNIDine  0.1 mg Oral BID  . enoxaparin (LOVENOX) injection  40 mg Subcutaneous QHS  . hydrALAZINE  25 mg Oral Q8H  . labetalol  300 mg Oral TID  . methadone  32.5 mg Oral Daily  . tiotropium  18 mcg Inhalation Daily   . sodium chloride 50 mL/hr at 06/18/12 2212    Physical Exam: The patient appears to be in no distress.  Head and neck exam reveals that the pupils are equal and reactive.  The extraocular movements are full.  There is no scleral icterus.  Mouth and pharynx are benign.  No lymphadenopathy.  No carotid bruits.  The jugular venous pressure is normal.  Thyroid is not enlarged or tender.  Chest is clear to percussion and auscultation.  No rales or rhonchi.  Expansion of the chest is symmetrical.  Heart reveals no abnormal lift or heave.  First and second heart sounds are normal.  There is no murmur gallop rub or click.  The abdomen is soft and nontender.  Bowel sounds are normoactive.  There is no hepatosplenomegaly or mass.  There are no abdominal bruits.  Extremities reveal no phlebitis or edema.  Pedal pulses are good.  There is no cyanosis or clubbing.  Neurologic exam is normal strength and no lateralizing weakness.  No sensory deficits.  Integument reveals no rash  Lab Results:  Recent Labs  06/18/12 0520 06/19/12 0645  WBC  13.3* 10.7*  HGB 12.2 12.3  PLT 246 256    Recent Labs  06/17/12 0350 06/18/12 0520  NA 137 137  K 3.5 3.7  CL 100 100  CO2 28 28  GLUCOSE 133* 155*  BUN 30* 25*  CREATININE 1.76* 1.46*    Recent Labs  06/17/12 2205 06/18/12 0520  TROPONINI 0.59* 0.79*   Hepatic Function Panel  Recent Labs  06/18/12 0520  PROT 6.6  ALBUMIN 2.7*  AST 14  ALT 11  ALKPHOS 104  BILITOT 0.1*   No results found for this basename: CHOL,  in the last 72 hours No results found for this basename: PROTIME,  in the last 72 hours  Imaging: No results found.  Cardiac Studies: Telemetry shows NSR Assessment/Plan:  1 NSTEMI (Type II)  - in settining of Pneumonia, tachycardia  - nondiagnostic, flat pattern troponins but still rising slowly. - pleuritic CP  2 CAD  - moderate, by cardiac catheterization, 09/2010  - s/p NSTEMI, in setting of malignant HTN  - EF 70-75%  3 Pneumonia, on IV abs  3 Polysubstance abuse  - h/o Heroin use, on chronic Methadone  4 HTN  5 Renal Insufficiency, creatinine trending down    Plan: 2D echo to be done today. Will get additional troponin today and recheck EKG today.  LOS: 3 days  Darlin Coco 06/19/2012, 8:20 AM

## 2012-06-19 NOTE — Progress Notes (Signed)
Pt. discharged to floor,verbalized understanding of discharged instruction,medication,restriction,diet and follow up appointment.Baseline Vitals sign stable,Pt comfortable,no sign and symptom of distress. 

## 2012-06-19 NOTE — Progress Notes (Addendum)
PATIENT DETAILS Name: Karla Nelson Age: 47 y.o. Sex: female Date of Birth: February 16, 1965 Admit Date: 06/16/2012 Admitting Physician Domenic Polite, MD MR:[id number] C, MD  Subjective: Continues to improve.Left post chest pain continues  Assessment/Plan: Active Problems: Community Acquired pneumonia -Fever of 102.6 on admission and did significant leukocytosis on admission, CXR confirmed inflitrate-started on empiric Rocephin and Zithromax -does not look toxic-clinically better today -c/w Rocephin and Zithromax -blood cultures on 5/9-negative  NSTEMI-type 2 -likely more of demand ischemia -cards following -awaiting Echo -on ASA, statins and Beta blockers  Acute on CKD Stage 2 -Found to have acute on CKD-creatinine worsened on 5/10-stopped HCTZ on 5/10 and continued IVF hydration -suspect prenal etiology -Creatinine better 5/12 -continue to hold HCTZ and NSAID's.Resume Losartan -saline lock IVF  UTI -Urine cs positive for Proteus -sensitive to Rocephin    HYPERTENSION -moderately controlled -c/w Hydralazine, Labetolol and Clonidine -resume Losartan today  COPD-on Home 02 -lungs mostly clear -c/w scheduled albuterol nebs  Pleuritic L sided chest pain -likely 2/2 PNA -clinically better-no indication for CT imaging at this point  CAD:  -stable -chest pain more pleuritic-and consistent with PNA-although troponin elevated-suspect more of a demand ischemia. 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  albuterol  2.5 mg Nebulization TID  . aspirin EC  81 mg Oral Daily  . azithromycin  500 mg Intravenous Q24H  . cefTRIAXone (ROCEPHIN)  IV  1 g Intravenous Q24H  . cloNIDine  0.1 mg Oral BID  . enoxaparin (LOVENOX) injection  40 mg Subcutaneous QHS  . hydrALAZINE  25 mg Oral Q8H  . labetalol  300 mg Oral TID  . losartan  100 mg Oral Daily  . methadone  32.5 mg Oral Daily  . tiotropium  18 mcg Inhalation Daily   Continuous Infusions:   PRN Meds:.acetaminophen, albuterol, morphine injection, nitroGLYCERIN, ondansetron, ondansetron (ZOFRAN) IV  Antibiotics: Anti-infectives   Start     Dose/Rate Route Frequency Ordered Stop   06/17/12 1200  azithromycin (ZITHROMAX) 500 mg in dextrose 5 % 250 mL IVPB     500 mg 250 mL/hr over 60 Minutes Intravenous Every 24 hours 06/16/12 1456     06/17/12 1100  cefTRIAXone (ROCEPHIN) 1 g in dextrose 5 % 50 mL IVPB     1 g 100 mL/hr over 30 Minutes Intravenous Every 24 hours 06/16/12 1456     06/16/12 1045  cefTRIAXone (ROCEPHIN) 1 g in dextrose 5 % 50 mL IVPB     1 g 100 mL/hr over 30 Minutes Intravenous  Once 06/16/12 1030 06/16/12 1137   06/16/12 1045  azithromycin (ZITHROMAX) 500 mg in dextrose 5 % 250 mL IVPB     500 mg 250 mL/hr over 60 Minutes Intravenous  Once 06/16/12 1030 06/16/12 1237       PHYSICAL EXAM: Vital signs in last 24 hours: Filed Vitals:   06/18/12 2058 06/18/12 2205 06/19/12 0500 06/19/12 0941  BP: 166/92 151/72 178/92 169/89  Pulse: 76 77 84   Temp: 97.9 F (36.6 C)  98.6 F (37 C)   TempSrc: Oral  Oral   Resp: 18 18  20   Height:      Weight:      SpO2: 95%  91%     Weight change:  Filed Weights   06/16/12 1449  Weight: 85.6 kg (188 lb 11.4 oz)   Body mass index is 31.4 kg/(m^2).   Gen Exam: Awake and alert with clear speech.   Neck: Supple, No JVD.   Chest: B/L Clear. No rhonchi CVS: S1 S2 Regular, no murmurs.  Abdomen: soft, BS +, non tender, non distended.  Extremities: no edema, lower extremities  warm to touch. Neurologic: Non Focal.   Skin: No Rash.   Wounds: N/A.    Intake/Output from previous day:  Intake/Output Summary (Last 24 hours) at 06/19/12 1040 Last data filed at 06/18/12 1756  Gross per 24 hour  Intake   1215 ml  Output      0 ml  Net   1215 ml     LAB RESULTS: CBC  Recent Labs Lab 06/16/12 0930 06/17/12 0350 06/18/12 0520 06/19/12 0645  WBC 26.4* 26.4* 13.3* 10.7*  HGB 15.2* 12.8 12.2 12.3  HCT 43.4 38.1 37.6 37.9  PLT 257 214 246 256  MCV 83.6 86.0 87.2 87.3  MCH 29.3 28.9 28.3 28.3  MCHC 35.0 33.6 32.4 32.5  RDW 13.7 14.4 14.7 14.4  LYMPHSABS  --   --  2.0  --   MONOABS  --   --  0.8  --   EOSABS  --   --  0.1  --   BASOSABS  --   --  0.0  --     Chemistries   Recent Labs Lab 06/16/12 0930 06/17/12 0350 06/18/12 0520 06/19/12 0645  NA 135 137 137 138  K 3.7 3.5 3.7 3.8  CL 95* 100 100 102  CO2 29 28 28 28   GLUCOSE 156* 133* 155* 183*  BUN 26* 30* 25* 17  CREATININE 1.55* 1.76* 1.46* 1.19*  CALCIUM 9.7 8.0* 8.5 9.0    CBG: No results found for this basename: GLUCAP,  in the last 168 hours  GFR Estimated Creatinine Clearance: 62.4 ml/min (by C-G formula based on Cr of 1.19).  Coagulation profile No results found for this basename: INR, PROTIME,  in the last 168 hours  Cardiac Enzymes  Recent Labs Lab 06/17/12 2205 06/18/12 0520 06/19/12 0645  TROPONINI 0.59* 0.79* 0.77*    No components found with this basename: POCBNP,  No results found for this basename: DDIMER,  in the last 72 hours No results found for this basename: HGBA1C,  in the last 72 hours No results found for this basename: CHOL, HDL, LDLCALC, TRIG, CHOLHDL, LDLDIRECT,  in the last 72 hours No results found for this basename: TSH, T4TOTAL, FREET3, T3FREE, THYROIDAB,  in the last 72 hours No results found for this basename: VITAMINB12, FOLATE, FERRITIN, TIBC, IRON, RETICCTPCT,  in the last 72 hours No results found for this basename: LIPASE, AMYLASE,  in  the last 72 hours  Urine Studies No results found for this basename: UACOL, UAPR, USPG, UPH, UTP, UGL, UKET, UBIL, UHGB, UNIT, UROB, ULEU, UEPI, UWBC, URBC, UBAC, CAST, CRYS, UCOM, BILUA,  in the last 72 hours  MICROBIOLOGY: Recent Results (from the past 240 hour(s))  CULTURE, BLOOD (ROUTINE X 2)     Status: None   Collection Time    06/16/12 10:20 AM      Result Value Range Status   Specimen Description BLOOD LEFT HAND   Final   Special Requests BOTTLES DRAWN AEROBIC  AND ANAEROBIC 10CC   Final   Culture  Setup Time 06/16/2012 18:03   Final   Culture     Final   Value:        BLOOD CULTURE RECEIVED NO GROWTH TO DATE CULTURE WILL BE HELD FOR 5 DAYS BEFORE ISSUING A FINAL NEGATIVE REPORT   Report Status PENDING   Incomplete  CULTURE, BLOOD (ROUTINE X 2)     Status: None   Collection Time    06/16/12 10:28 AM      Result Value Range Status   Specimen Description BLOOD LEFT ARM   Final   Special Requests BOTTLES DRAWN AEROBIC AND ANAEROBIC 10CC   Final   Culture  Setup Time 06/16/2012 17:59   Final   Culture     Final   Value:        BLOOD CULTURE RECEIVED NO GROWTH TO DATE CULTURE WILL BE HELD FOR 5 DAYS BEFORE ISSUING A FINAL NEGATIVE REPORT   Report Status PENDING   Incomplete  URINE CULTURE     Status: None   Collection Time    06/16/12 11:25 AM      Result Value Range Status   Specimen Description URINE, CLEAN CATCH   Final   Special Requests NONE   Final   Culture  Setup Time 06/16/2012 17:51   Final   Colony Count 50,000 COLONIES/ML   Final   Culture PROTEUS MIRABILIS   Final   Report Status 06/18/2012 FINAL   Final   Organism ID, Bacteria PROTEUS MIRABILIS   Final    RADIOLOGY STUDIES/RESULTS: Dg Chest 2 View  06/16/2012  *RADIOLOGY REPORT*  Clinical Data: Fever, rib pain  CHEST - 2 VIEW  Comparison: Prior chest x-ray 06/21/2011  Findings: Interval development of focal rounded opacity in the medial left lower lobe which projects over the heart on the frontal view, and the  spine on the lateral view.  Otherwise, the lungs are clear.  No pleural effusion, pneumothorax or edema.  The cardiac and mediastinal contours remain within normal limits.  No acute osseous abnormality.  IMPRESSION: Interval development of a rounded opacity in the medial left lower lobe.  Given the clinical history, the most likely consideration is round pneumonia.  Recommend repeat chest x-ray following appropriate course of antibiotic therapy in 4 - 6 weeks to document resolution and exclude an underlying pulmonary nodule.   Original Report Authenticated By: Jacqulynn Cadet, M.D.    Dg Lumbar Spine Complete  05/22/2012  *RADIOLOGY REPORT*  Clinical Data: Low back pain for 6 weeks  LUMBAR SPINE - COMPLETE 4+ VIEW  Comparison: None.  Findings: Five lumbar-type vertebral bodies show normal alignment. There is disc space narrowing at the L5-S1 level consistent with chronic disc degeneration.  There is mild facet degeneration at L4- 5 and L5-S1.  No pars defect or slippage.  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  IMPRESSION: Degenerative disc disease at L5-S1 with disc space narrowing.  Lower lumbar facet degeneration.  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   Original Report Authenticated By: Nelson Chimes, M.D.     Oren Binet, MD  [facility] Pager:[phone]  If 7PM-7AM, please contact night-coverage [email or web address] Password [email or web address] 06/19/2012, 10:40 AM   LOS: 3 days

## 2012-06-19 NOTE — Progress Notes (Signed)
  Echocardiogram 2D Echocardiogram has been performed.  Naylah Cork 06/19/2012, 11:41 AM

## 2012-06-19 NOTE — Discharge Summary (Signed)
Physician Discharge Summary  Camas Kinsman [id number] DOB: November 13, 1965 DOA: 06/16/2012  PCP: [name], MD  Admit date: 06/16/2012 Discharge date: 06/19/2012  Time spent: 45 minutes  Recommendations for Outpatient Follow-up:  CBC and BMET in 1 week Tobacco Cessation On going blood pressure management  Discharge Diagnoses:  Principal Problem:   Community acquired pneumonia Active Problems:   NSTEMI (non-ST elevated myocardial infarction)   Acute-on-chronic kidney injury   HYPERTENSION   CAD (coronary artery disease)   Tobacco abuse   COPD (chronic obstructive pulmonary disease)   Discharge Condition: Stable, no chest pain, breathing easily, eating well.    Diet recommendation: Heart Healthy  Filed Weights   06/16/12 1449  Weight: 85.6 kg (188 lb 11.4 oz)    History of present illness:  Karla Nelson is a 47 y.o. 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 Yesterday patient noticed left-sided chest pain, which is worse when she took a deep breath and also on touching/palpating the left side of her chest and back.  She reports shortness of breath, feeling hot and cold, denies any cough, reports chronic congestion.  Upon evaluation the emergency room she was noted to be febrile with a temperature of 102.6, hypoxic at 82% on room air, white count of 26K and CXR with L lung opacity concerning for pneumonia.    Hospital Course:  Community Acquired pneumonia  Fever of 102.6 and significant leukocytosis on admission, CXR confirmed inflitrate-started on empiric Rocephin and Zithromax.  Blood cultures on 5/9-negative.  Afebrile for over 24 hours.  Will be discharged on Levaquin to complete  her antibiotic course.  NSTEMI-type 2  Likely demand ischemia. Troponin peaked at .79.   Dr. Mare Ferrari from Cardiology provided consultation.  Echocardiogram showed LVH, EF of 55 - 60%, no wall motion abnormalities, grade two diastolic dysfunction. Discharged on ASA, statins and Beta blockers.  LHC in 02/13/2010-showed Non obst CAD. Since repeat Echo did not show any significant wall motion abnormalities or other abnormalities-spoke with Dr Mare Ferrari over the phone who did not recommend further work up and cleared patient for discharge.  Acute on CKD Stage 2  Found to have acute on CKD-creatinine worsened on 5/10.  HCTZ was discontinued and IV hydration was continued.  Suspect prenal etiology.  Creatinine has now improved.    Will continue to hold HCTZ and NSAID'Karla.Resume Losartan.  UTI  Urine cs positive for Proteus.  Sensitive to Rocephin.  Received 4 doses of IV rocephin in house.  Will be discharged on Levaquin.  HYPERTENSION  Difficult to control.  Continue with Hydralazine, Labetolol and Clonidine and Losartan.  HCTZ discontinued.  COPD-on Home 02  Lngs mostly clear.  Will continue with albuterol nebs.  Discharge with Nebs, Albuterol inhaler, Spiriva, Oxygen PRN at night.  Pleuritic L sided chest pain  Likely secondary to pneumonia rather than cardiac source.  Cinically better-no indication for CT imaging at this point.   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 Mare Ferrari  Discharge Exam: Filed Vitals:   06/18/12 2205 06/19/12 0500 06/19/12 0941 06/19/12 1412  BP: 151/72 178/92 169/89   Pulse: 77 84    Temp:  98.6 F (37 C)    TempSrc:  Oral    Resp: 18 20    Height:      Weight:      SpO2:  91%  97%   Gen Exam: Awake and alert with clear speech.  Neck: Supple, No JVD.  Chest: B/L Clear. No rhonchi  CVS: S1 S2 Regular, no  murmurs.  Abdomen: soft, BS +, non tender, non distended.  Extremities: no edema, lower extremities warm to touch.  Neurologic: Non Focal.  Skin: No Rash.    Discharge Instructions      Discharge Orders   Future Orders Complete By Expires     Diet - low sodium heart healthy  As directed     Increase activity slowly  As directed         Medication List    STOP taking these medications       hydrochlorothiazide 50 MG tablet  Commonly known as:  HYDRODIURIL     lisinopril 40 MG tablet  Commonly known as:  PRINIVIL,ZESTRIL      TAKE these medications       albuterol (2.5 MG/3ML) 0.083% nebulizer solution  Commonly known as:  PROVENTIL  Take 3 mLs (2.5 mg total) by nebulization every 6 (six) hours as needed for wheezing.     albuterol 108 (90 BASE) MCG/ACT inhaler  Commonly known as:  PROAIR HFA  Inhale 2 puffs into the lungs every 6 (six) hours as needed. Shortness of breath     aspirin 81 MG EC tablet  Take 1 tablet (81 mg total) by mouth daily.     cloNIDine 0.1 MG tablet  Commonly known as:  CATAPRES  Take 0.1 mg by mouth 2 (two) times daily.     FISH OIL BURP-LESS PO  Take 1 capsule by mouth daily.     hydrALAZINE 50 MG tablet  Commonly known as:  APRESOLINE  Take 0.5 tablets (25 mg total) by mouth 3 (three) times daily.     labetalol 300 MG tablet  Commonly known as:  NORMODYNE  Take 1 tablet (300 mg total) by mouth 3 (three) times daily.     levofloxacin 750 MG tablet  Commonly known as:  LEVAQUIN  Take 1 tablet (750 mg total) by mouth daily.     losartan 100 MG tablet  Commonly known as:  COZAAR  Take 1 tablet (100 mg total) by mouth daily.     METHADONE HCL PO  Take 33 mg by mouth every morning.     nicotine 14 mg/24hr patch  Commonly known as:  [employer] NICOTINE TRANSDERMAL SYS  Place 1 patch onto the skin daily.     nitroGLYCERIN 0.4 MG SL tablet  Commonly known as:  NITROSTAT  Place 0.4 mg under the tongue every 5 (five) minutes as needed  for chest pain.     tiotropium 18 MCG inhalation capsule  Commonly known as:  SPIRIVA  Place 1 capsule (18 mcg total) into inhaler and inhale daily.       No Known Allergies Follow-up Information   Follow up with [name], MD. Schedule an appointment as soon as possible for a visit in 1 week.   Contact information:   [facility] [address] [place] [id number]        The results of significant diagnostics from this hospitalization (including imaging, microbiology, ancillary and laboratory) are listed below for reference.    Significant Diagnostic Studies:  2D Echocardiogram Study Conclusions  -  Left ventricle: The cavity size was normal. Wall thickness was increased in a pattern of moderate LVH. Systolic function was normal. The estimated ejection fraction was in the range of 55% to 60%. Wall motion was normal; there were no regional wall motion abnormalities. Features are consistent with a pseudonormal left ventricular filling pattern, with concomitant abnormal relaxation and increased filling pressure (grade 2 diastolic dysfunction). - Left atrium: The atrium was mildly dilated. - Right atrium: The atrium was mildly dilated. - Pulmonary arteries: Systolic pressure was mildly increased. PA peak pressure: 45mm Hg (Karla). - Pericardium, extracardiac: A trivial pericardial effusion was identified.   Lower Extremity Venous Doppler Summary: - No evidence of deep vein thrombosis involving the right lower extremity. - No evidence of deep vein thrombosis involving the left lower extremity.  Dg Chest 2 View  06/16/2012  *RADIOLOGY REPORT*  Clinical Data: Fever, rib pain  CHEST - 2 VIEW  Comparison: Prior chest x-ray 06/21/2011  Findings: Interval development of focal rounded opacity in the medial left lower lobe which projects over the heart on the frontal view, and the spine on the lateral view.  Otherwise, the lungs are clear.  No pleural effusion,  pneumothorax or edema.  The cardiac and mediastinal contours remain within normal limits.  No acute osseous abnormality.  IMPRESSION: Interval development of a rounded opacity in the medial left lower lobe.  Given the clinical history, the most likely consideration is round pneumonia.  Recommend repeat chest x-ray following appropriate course of antibiotic therapy in 4 - 6 weeks to document resolution and exclude an underlying pulmonary nodule.   Original Report Authenticated By: Jacqulynn Cadet, M.D.    Dg Lumbar Spine Complete  05/22/2012  *RADIOLOGY REPORT*  Clinical Data: Low back pain for 6 weeks  LUMBAR SPINE - COMPLETE 4+ VIEW  Comparison: None.  Findings: Five lumbar-type vertebral bodies show normal alignment. There is disc space narrowing at the L5-S1 level consistent with chronic disc degeneration.  There is mild facet degeneration at L4- 5 and L5-S1.  No pars defect or slippage.  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  IMPRESSION: Degenerative disc disease at L5-S1 with disc space narrowing.  Lower lumbar facet degeneration.  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   Original Report Authenticated By: Nelson Chimes, M.D.     Microbiology: Recent Results (from the past 240 hour(Karla))  CULTURE, BLOOD (ROUTINE X 2)     Status: None   Collection Time    06/16/12 10:20 AM      Result Value Range Status   Specimen Description BLOOD LEFT HAND   Final   Special Requests BOTTLES DRAWN AEROBIC AND ANAEROBIC 10CC   Final   Culture  Setup Time 06/16/2012 18:03   Final   Culture     Final   Value:        BLOOD CULTURE RECEIVED NO GROWTH TO DATE CULTURE WILL BE HELD FOR 5 DAYS BEFORE ISSUING A FINAL NEGATIVE REPORT   Report Status PENDING   Incomplete  CULTURE, BLOOD (ROUTINE X 2)     Status: None   Collection Time    06/16/12 10:28 AM      Result Value Range Status   Specimen Description BLOOD LEFT ARM   Final   Special Requests BOTTLES DRAWN AEROBIC  AND ANAEROBIC 10CC   Final   Culture  Setup Time 06/16/2012 17:59   Final   Culture     Final   Value:  BLOOD CULTURE RECEIVED NO GROWTH TO DATE CULTURE WILL BE HELD FOR 5 DAYS BEFORE ISSUING A FINAL NEGATIVE REPORT   Report Status PENDING   Incomplete  URINE CULTURE     Status: None   Collection Time    06/16/12 11:25 AM      Result Value Range Status   Specimen Description URINE, CLEAN CATCH   Final   Special Requests NONE   Final   Culture  Setup Time 06/16/2012 17:51   Final   Colony Count 50,000 COLONIES/ML   Final   Culture PROTEUS MIRABILIS   Final   Report Status 06/18/2012 FINAL   Final   Organism ID, Bacteria PROTEUS MIRABILIS   Final     Labs: Basic Metabolic Panel:  Recent Labs Lab 06/16/12 0930 06/17/12 0350 06/18/12 0520 06/19/12 0645  NA 135 137 137 138  K 3.7 3.5 3.7 3.8  CL 95* 100 100 102  CO2 29 28 28 28   GLUCOSE 156* 133* 155* 183*  BUN 26* 30* 25* 17  CREATININE 1.55* 1.76* 1.46* 1.19*  CALCIUM 9.7 8.0* 8.5 9.0   Liver Function Tests:  Recent Labs Lab 06/17/12 0350 06/18/12 0520  AST 13 14  ALT 10 11  ALKPHOS 118* 104  BILITOT 0.3 0.1*  PROT 6.5 6.6  ALBUMIN 2.6* 2.7*   CBC:  Recent Labs Lab 06/16/12 0930 06/17/12 0350 06/18/12 0520 06/19/12 0645  WBC 26.4* 26.4* 13.3* 10.7*  NEUTROABS  --   --  10.4*  --   HGB 15.2* 12.8 12.2 12.3  HCT 43.4 38.1 37.6 37.9  MCV 83.6 86.0 87.2 87.3  PLT 257 214 246 256   Cardiac Enzymes:  Recent Labs Lab 06/17/12 1222 06/17/12 1611 06/17/12 2205 06/18/12 0520 06/19/12 0645  TROPONINI 0.50* 0.53* 0.59* 0.79* 0.77*    Signed:  York, Marianne L, PA-C  [facility] 06/19/2012, 2:37 PM  Attending Patient seen and examined, agree with the assessment and plan. Repeat Echo does not show any major abnormalities, Karla [place] PA-C spoke with Dr Mare Ferrari over the phone, who did not recommend any further cardiac work up at this point, and cleared the patient for discharge. Stable for  discharge, to complete outpatient antibiotcs.  Karla Nelson

## 2012-06-19 NOTE — Care Management Note (Signed)
    Page 1 of 1   06/19/2012     5:35:12 PM   CARE MANAGEMENT NOTE 06/19/2012  Patient:  Karla Nelson, Karla Nelson   Account Number:  [id number]  Date Initiated:  06/17/2012  Documentation initiated by:  Llana Aliment  Subjective/Objective Assessment:   46YO FEMALE ADMITTED WITH PNA.  PT. HAS COPD AND IS HOME O2 DEPENDENT.     Action/Plan:   DISCHARGE PLANNING   Anticipated DC Date:  06/19/2012   Anticipated DC Plan:  [place]  CM consult      Choice offered to / List presented to:             Status of service:  Completed, signed off Medicare Important Message given?   (If response is "NO", the following Medicare IM given date fields will be blank) Date Medicare IM given:   Date Additional Medicare IM given:    Discharge Disposition:  HOME/SELF CARE  Per UR Regulation:  Reviewed for med. necessity/level of care/duration of stay  If discussed at [place] of Stay Meetings, dates discussed:    Comments:  06/19/12 17:34 Tomi Bamberger RN, BSN [phone] patient lives with sig other, pta indep,  No needs anticipated.

## 2012-06-22 LAB — CULTURE, BLOOD (ROUTINE X 2): Culture: NO GROWTH

## 2012-07-11 ENCOUNTER — Telehealth (HOSPITAL_COMMUNITY): Payer: Self-pay | Admitting: Cardiac Rehabilitation

## 2012-07-11 NOTE — Telephone Encounter (Signed)
pc to pt to enroll in pulmonary rehab program.  Pt has medicaid coverage. Pt mailed financial assistance application since pul rehab not covered by medicaid.  Pt verbalized understanding.

## 2012-08-01 ENCOUNTER — Other Ambulatory Visit (HOSPITAL_COMMUNITY): Payer: Self-pay | Admitting: Internal Medicine

## 2012-08-01 DIAGNOSIS — Z1231 Encounter for screening mammogram for malignant neoplasm of breast: Secondary | ICD-10-CM

## 2012-08-09 ENCOUNTER — Ambulatory Visit (HOSPITAL_COMMUNITY)
Admission: RE | Admit: 2012-08-09 | Discharge: 2012-08-09 | Disposition: A | Discharge status: Home / Self Care | Payer: Medicare Other | Source: Ambulatory Visit | Attending: Internal Medicine | Admitting: Internal Medicine

## 2012-08-09 DIAGNOSIS — Z1231 Encounter for screening mammogram for malignant neoplasm of breast: Secondary | ICD-10-CM | POA: Insufficient documentation

## 2012-08-12 ENCOUNTER — Other Ambulatory Visit: Payer: Self-pay | Admitting: Internal Medicine

## 2012-09-04 ENCOUNTER — Ambulatory Visit (INDEPENDENT_AMBULATORY_CARE_PROVIDER_SITE_OTHER): Payer: Medicare Other | Admitting: Nurse Practitioner

## 2012-09-04 ENCOUNTER — Encounter: Payer: Self-pay | Admitting: Nurse Practitioner

## 2012-09-04 VITALS — BP 190/120 | HR 79 | Ht 65.0 in | Wt 172.4 lb

## 2012-09-04 DIAGNOSIS — J449 Chronic obstructive pulmonary disease, unspecified: Secondary | ICD-10-CM

## 2012-09-04 DIAGNOSIS — I2510 Atherosclerotic heart disease of native coronary artery without angina pectoris: Secondary | ICD-10-CM

## 2012-09-04 DIAGNOSIS — E785 Hyperlipidemia, unspecified: Secondary | ICD-10-CM

## 2012-09-04 LAB — BASIC METABOLIC PANEL
BUN: 19 mg/dL (ref 6–23)
CO2: 30 mEq/L (ref 19–32)
Calcium: 9.1 mg/dL (ref 8.4–10.5)
Chloride: 102 mEq/L (ref 96–112)
Creatinine, Ser: 1.5 mg/dL — ABNORMAL HIGH (ref 0.4–1.2)
GFR: 49.37 mL/min — ABNORMAL LOW (ref >60.00)
Glucose, Bld: 118 mg/dL — ABNORMAL HIGH (ref 70–99)
Potassium: 3.5 mEq/L (ref 3.5–5.1)
Sodium: 139 mEq/L (ref 135–145)

## 2012-09-04 LAB — HEPATIC FUNCTION PANEL
ALT: 21 U/L (ref 0–35)
AST: 20 U/L (ref 0–37)
Albumin: 3.8 g/dL (ref 3.5–5.2)
Alkaline Phosphatase: 88 U/L (ref 39–117)
Bilirubin, Direct: 0 mg/dL (ref 0.0–0.3)
Total Bilirubin: 0.2 mg/dL — ABNORMAL LOW (ref 0.3–1.2)
Total Protein: 7.3 g/dL (ref 6.0–8.3)

## 2012-09-04 LAB — LIPID PANEL
Cholesterol: 156 mg/dL (ref 0–200)
HDL: 32.5 mg/dL — ABNORMAL LOW (ref >39.00)
LDL Cholesterol: 89 mg/dL (ref 0–99)
Total CHOL/HDL Ratio: 5
Triglycerides: 172 mg/dL — ABNORMAL HIGH (ref 0.0–149.0)
VLDL: 34.4 mg/dL (ref 0.0–40.0)

## 2012-09-04 MED ORDER — HYDRALAZINE HCL 100 MG PO TABS: 100.0000 mg | ORAL_TABLET | Freq: Three times a day (TID) | ORAL | Status: DC

## 2012-09-04 NOTE — Patient Instructions (Addendum)
Continue with your current medicines except we are increasing your Hydralazine to 100 mg three times a day  We are checking labs today  BP check in one week.   Try to stop smoking  Call the [facility] office at [phone] if you have any questions, problems or concerns.

## 2012-09-04 NOTE — Progress Notes (Signed)
Karla Nelson Date of Birth: 05-24-1965 Medical Record [id number]  History of Present Illness: Karla Nelson is seen back today for a follow up visit. Seen for Dr. Johnsie Cancel. Has a history of CAD with prior NSTEMI in August of 2012. Cath showed moderate nonobstructive CAD. Other issues include tobacco abuse and HTN. 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   Last seen in December of 2012. Started on HCTZ at that visit. Did not keep her follow up with [place].   Comes back today. Here alone. Here to get an EKG. No chest pain. Says her breathing is fine as long as she is on Spiriva. Has had NONE of her medicines today. BP remains quite elevated and in looking at past visits - continues to be severely high. Still smoking. No swelling. Not dizzy. Says that she has been taking her medicines regularly - just not this morning.    Current Outpatient Prescriptions  Medication Sig Dispense Refill  . albuterol (PROAIR HFA) 108 (90 BASE) MCG/ACT inhaler Inhale 2 puffs into the lungs every 6 (six) hours as needed. Shortness of breath  1 Inhaler  6  . albuterol (PROVENTIL) (2.5 MG/3ML) 0.083% nebulizer solution Take 3 mLs (2.5 mg total) by nebulization every 6 (six) hours as needed for wheezing.  75 mL  12  . aspirin EC 81 MG EC tablet Take 1 tablet (81 mg total) by mouth daily.      . cloNIDine (CATAPRES) 0.1 MG tablet Take 0.1 mg by mouth 2 (two) times daily.       . hydrALAZINE (APRESOLINE) 50 MG tablet Take 0.5 tablets (25 mg total) by mouth 3 (three) times daily.  90 tablet  3  . labetalol (NORMODYNE) 300 MG tablet Take 1 tablet (300 mg total) by mouth 3 (three) times daily.  90 tablet  5  . losartan (COZAAR) 100 MG tablet Take 1 tablet (100 mg total) by mouth daily.  30 tablet  5  . METHADONE HCL PO Take 33 mg by mouth every morning.       . Multiple Vitamin (MULTIVITAMIN) tablet Take 1 tablet by mouth daily.      . nitroGLYCERIN (NITROSTAT) 0.4 MG SL tablet Place 0.4 mg under the tongue every 5 (five)  minutes as needed for chest pain.      . Omega-3 Fatty Acids (FISH OIL BURP-LESS PO) Take 1 capsule by mouth daily.       Marland Kitchen tiotropium (SPIRIVA) 18 MCG inhalation capsule Place 1 capsule (18 mcg total) into inhaler and inhale daily.  30 capsule  5   No current facility-administered medications for this visit.    No Known Allergies  Past Medical History  Diagnosis Date  . HTN (hypertension)     Has normal renal arteries.  . Cleft palate   . NSTEMI (non-ST elevated myocardial infarction) 09-25-2010  . DYSPNEA ON EXERTION 03/08/2007  . ELECTROCARDIOGRAM, ABNORMAL 02/13/2010  . CAD (coronary artery disease)     NSTEMI with cath in August 2012 with nonobstructive disease, 50% ostial D1 and 70% distal LCX and PL disease. Normal EF. Has diastolic dysfunction with elevated EDP  . Tobacco abuse disorder   . COPD (chronic obstructive pulmonary disease)   . Hyperlipidemia   . Asthma     Past Surgical History  Procedure Laterality Date  . Cesarean section    . Cardiac catheterization  Aug 2012    Moderate nonobstructive multivessel CAD with an EF of 70 to 75%  . Coronary angioplasty with  stent placement      History  Smoking status  . Current Every Day Smoker -- 0.50 packs/day for 31 years  . Types: Cigarettes  Smokeless tobacco  . Never Used    Comment: 4 CIGS A DAY    History  Alcohol Use No    Family History  Problem Relation Age of Onset  . Diabetes    . Hypertension    . Asthma Mother   . Asthma Brother   . Heart disease Mother     Review of Systems: The review of systems is per the HPI.  All other systems were reviewed and are negative.  Physical Exam: BP 190/120  Pulse 79  Ht 5\' 5"  (1.651 m)  Wt 172 lb 6.4 oz (78.2 kg)  BMI 28.69 kg/m2  LMP 07/17/2012 Patient is alert and in no acute distress. Skin is warm and dry. Color is normal.  HEENT is unremarkable. Normocephalic/atraumatic. PERRL. Sclera are nonicteric. Neck is supple. No masses. No JVD. Lungs are clear.  Cardiac exam shows a regular rate and rhythm. Abdomen is soft. Extremities are without edema. Gait and ROM are intact. No gross neurologic deficits noted.  LABORATORY DATA: PENDING  EKG today shows sinus rhythm with LVH/diffuse T wave changes which are unchanged. No AV block noted.   Lab Results  Component Value Date   WBC 10.7* 06/19/2012   HGB 12.3 06/19/2012   HCT 37.9 06/19/2012   PLT 256 06/19/2012   GLUCOSE 183* 06/19/2012   CHOL 138 09/25/2010   TRIG 79 09/25/2010   HDL 36* 09/25/2010   LDLCALC 86 09/25/2010   ALT 11 06/18/2012   AST 14 06/18/2012   NA 138 06/19/2012   K 3.8 06/19/2012   CL 102 06/19/2012   CREATININE 1.19* 06/19/2012   BUN 17 06/19/2012   CO2 28 06/19/2012   TSH 2.457 09/25/2010   INR 0.90 09/25/2010   HGBA1C 6.8* 09/25/2010    Assessment / Plan: 1. CAD - past NSTEMI - moderate CAD - managed medically. No symptoms reported.   2. HTN - BP grossly elevated and in looking back at past OV's this has been a consistent theme. I have increased her Hydralazine to 100 mg TID. See her for a BP check in one week. I will see her in a week.   3. Tobacco abuse - cessation encouraged.   4. HLD - check labs today. Not on statin therapy.   5. Recovering addict - on Methadone   Patient is agreeable to this plan and will call if any problems develop in the interim.   Burtis Junes, RN, ANP-C [facility] [address] [place], [place]  [id number]

## 2012-09-22 ENCOUNTER — Emergency Department (INDEPENDENT_AMBULATORY_CARE_PROVIDER_SITE_OTHER)
Admission: EM | Admit: 2012-09-22 | Discharge: 2012-09-22 | Disposition: A | Discharge status: Home / Self Care | Payer: Medicare Other | Source: Home / Self Care

## 2012-09-22 ENCOUNTER — Encounter (HOSPITAL_COMMUNITY): Payer: Self-pay

## 2012-09-22 DIAGNOSIS — H60399 Other infective otitis externa, unspecified ear: Secondary | ICD-10-CM

## 2012-09-22 DIAGNOSIS — H6091 Unspecified otitis externa, right ear: Secondary | ICD-10-CM

## 2012-09-22 DIAGNOSIS — H6690 Otitis media, unspecified, unspecified ear: Secondary | ICD-10-CM

## 2012-09-22 DIAGNOSIS — H6691 Otitis media, unspecified, right ear: Secondary | ICD-10-CM

## 2012-09-22 MED ORDER — ANTIPYRINE-BENZOCAINE 5.4-1.4 % OT SOLN: 3.0000 [drp] | OTIC | Status: DC | PRN

## 2012-09-22 MED ORDER — NEOMYCIN-POLYMYXIN-HC 3.5-10000-1 OT SUSP: 4.0000 [drp] | Freq: Three times a day (TID) | OTIC | Status: DC

## 2012-09-22 MED ORDER — CEFUROXIME AXETIL 250 MG PO TABS: 250.0000 mg | ORAL_TABLET | Freq: Two times a day (BID) | ORAL | Status: DC

## 2012-09-22 NOTE — ED Notes (Signed)
C/o pain in her right ear x 1 week; no relief w OTC ,home treatment; has not taken her BP medication

## 2012-09-22 NOTE — ED Provider Notes (Signed)
CSN: [id number]     Arrival date & time 09/22/12  0803 History     First MD Initiated Contact with Patient 09/22/12 [phone]     Chief Complaint  Patient presents with  . Otalgia   (Consider location/radiation/quality/duration/timing/severity/associated sxs/prior Treatment) HPI Comments: R earache for 1 week, Itchy, drainage, feels swollen. Is using Q tips for cleaning and scratching. No fever or ST.   Past Medical History  Diagnosis Date  . HTN (hypertension)     Has normal renal arteries.  . Cleft palate   . NSTEMI (non-ST elevated myocardial infarction) 09-25-2010  . DYSPNEA ON EXERTION 03/08/2007  . ELECTROCARDIOGRAM, ABNORMAL 02/13/2010  . CAD (coronary artery disease)     NSTEMI with cath in August 2012 with nonobstructive disease, 50% ostial D1 and 70% distal LCX and PL disease. Normal EF. Has diastolic dysfunction with elevated EDP  . Tobacco abuse disorder   . COPD (chronic obstructive pulmonary disease)   . Hyperlipidemia   . Asthma    Past Surgical History  Procedure Laterality Date  . Cesarean section    . Cardiac catheterization  Aug 2012    Moderate nonobstructive multivessel CAD with an EF of 70 to 75%  . Coronary angioplasty with stent placement     Family History  Problem Relation Age of Onset  . Diabetes    . Hypertension    . Asthma Mother   . Asthma Brother   . Heart disease Mother    History  Substance Use Topics  . Smoking status: Current Every Day Smoker -- 0.50 packs/day for 31 years    Types: Cigarettes  . Smokeless tobacco: Never Used     Comment: 4 CIGS A DAY  . Alcohol Use: No   OB History   Grav Para Term Preterm Abortions TAB SAB Ect Mult Living            1     Review of Systems  Constitutional: Negative.   HENT: Positive for ear pain and ear discharge. Negative for congestion, sore throat, rhinorrhea, sneezing, neck pain and postnasal drip.   Eyes: Negative.   Respiratory: Negative.   Gastrointestinal: Negative.   Skin: Negative  for rash.  Neurological: Negative for dizziness, speech difficulty, light-headedness and headaches.    Allergies  Review of patient's allergies indicates no known allergies.  Home Medications   Current Outpatient Rx  Name  Route  Sig  Dispense  Refill  . albuterol (PROAIR HFA) 108 (90 BASE) MCG/ACT inhaler   Inhalation   Inhale 2 puffs into the lungs every 6 (six) hours as needed. Shortness of breath   1 Inhaler   6   . EXPIRED: albuterol (PROVENTIL) (2.5 MG/3ML) 0.083% nebulizer solution   Nebulization   Take 3 mLs (2.5 mg total) by nebulization every 6 (six) hours as needed for wheezing.   75 mL   12   . antipyrine-benzocaine (AURALGAN) otic solution   Right Ear   Place 3 drops into the right ear every 2 (two) hours as needed for pain.   10 mL   0   . aspirin EC 81 MG EC tablet   Oral   Take 1 tablet (81 mg total) by mouth daily.         . cefUROXime (CEFTIN) 250 MG tablet   Oral   Take 1 tablet (250 mg total) by mouth 2 (two) times daily.   20 tablet   0   . cloNIDine (CATAPRES) 0.1 MG tablet  Oral   Take 0.1 mg by mouth 2 (two) times daily.          . hydrALAZINE (APRESOLINE) 100 MG tablet   Oral   Take 1 tablet (100 mg total) by mouth 3 (three) times daily.   90 tablet   6   . labetalol (NORMODYNE) 300 MG tablet   Oral   Take 1 tablet (300 mg total) by mouth 3 (three) times daily.   90 tablet   5   . losartan (COZAAR) 100 MG tablet   Oral   Take 1 tablet (100 mg total) by mouth daily.   30 tablet   5   . METHADONE HCL PO   Oral   Take 33 mg by mouth every morning.          . Multiple Vitamin (MULTIVITAMIN) tablet   Oral   Take 1 tablet by mouth daily.         Marland Kitchen neomycin-polymyxin-hydrocortisone (CORTISPORIN) 3.5-10000-1 otic suspension   Right Ear   Place 4 drops into the right ear 3 (three) times daily.   10 mL   0   . nitroGLYCERIN (NITROSTAT) 0.4 MG SL tablet   Sublingual   Place 0.4 mg under the tongue every 5 (five)  minutes as needed for chest pain.         . Omega-3 Fatty Acids (FISH OIL BURP-LESS PO)   Oral   Take 1 capsule by mouth daily.          Marland Kitchen tiotropium (SPIRIVA) 18 MCG inhalation capsule   Inhalation   Place 1 capsule (18 mcg total) into inhaler and inhale daily.   30 capsule   5    BP 200/126  Pulse 71  Temp(Src) 98.4 F (36.9 C) (Oral)  Resp 18  SpO2 93% Physical Exam  Nursing note and vitals reviewed. Constitutional: She is oriented to person, place, and time. She appears well-developed and well-nourished. No distress.  HENT:  Head: Normocephalic and atraumatic.  Mouth/Throat: Oropharynx is clear and moist. No oropharyngeal exudate.  Right EAC is moist erythematous and mildly inflamed. The lumen is open so that the TM is well-visualized. There is erythema to approximately 50% of the TM. No bulging or evidence of effusion. Left TM and EAC are normal.  Eyes: Conjunctivae and EOM are normal.  Neck: Normal range of motion. Neck supple.  Pulmonary/Chest: Effort normal. No respiratory distress.  Lymphadenopathy:    She has no cervical adenopathy.  Neurological: She is alert and oriented to person, place, and time. No cranial nerve deficit.  Skin: Skin is warm and dry. No rash noted. She is not diaphoretic.  Psychiatric: She has a normal mood and affect.    ED Course   Procedures (including critical care time)  Labs Reviewed - No data to display No results found. 1. Right otitis externa   2. Right otitis media     MDM  Auralgan otic as directed for ear pain Cortisporin otic as directed for R ear Ceftin 250 bid Keep ear dry. Dont use Q tips  Janne Napoleon, NP 09/22/12 [phone]

## 2012-09-23 NOTE — ED Provider Notes (Signed)
Medical screening examination/treatment/procedure(s) were performed by resident physician or non-physician practitioner and as supervising physician I was immediately available for consultation/collaboration.   Pauline Good MD.   Billy Fischer, MD 09/23/12 [phone]

## 2012-09-29 ENCOUNTER — Encounter (HOSPITAL_COMMUNITY): Payer: Self-pay

## 2012-09-29 ENCOUNTER — Emergency Department (INDEPENDENT_AMBULATORY_CARE_PROVIDER_SITE_OTHER)
Admission: EM | Admit: 2012-09-29 | Discharge: 2012-09-29 | Disposition: A | Discharge status: Home / Self Care | Payer: Medicare Other | Source: Home / Self Care

## 2012-09-29 DIAGNOSIS — R0902 Hypoxemia: Secondary | ICD-10-CM

## 2012-09-29 DIAGNOSIS — J441 Chronic obstructive pulmonary disease with (acute) exacerbation: Secondary | ICD-10-CM

## 2012-09-29 DIAGNOSIS — Z720 Tobacco use: Secondary | ICD-10-CM

## 2012-09-29 DIAGNOSIS — F17200 Nicotine dependence, unspecified, uncomplicated: Secondary | ICD-10-CM

## 2012-09-29 DIAGNOSIS — J45909 Unspecified asthma, uncomplicated: Secondary | ICD-10-CM

## 2012-09-29 DIAGNOSIS — J45901 Unspecified asthma with (acute) exacerbation: Secondary | ICD-10-CM

## 2012-09-29 MED ORDER — ALBUTEROL SULFATE (5 MG/ML) 0.5% IN NEBU
5.0000 mg | INHALATION_SOLUTION | Freq: Once | RESPIRATORY_TRACT | Status: AC
  Administered 2012-09-29: 5 mg via RESPIRATORY_TRACT

## 2012-09-29 MED ORDER — METHYLPREDNISOLONE SODIUM SUCC 125 MG IJ SOLR
125.0000 mg | Freq: Once | INTRAMUSCULAR | Status: AC
Start: 2012-09-29 — End: 2012-09-29
  Administered 2012-09-29: 125 mg via INTRAMUSCULAR

## 2012-09-29 MED ORDER — PREDNISONE 20 MG PO TABS: ORAL_TABLET | ORAL | Status: DC

## 2012-09-29 MED ORDER — IPRATROPIUM BROMIDE 0.02 % IN SOLN
0.5000 mg | Freq: Once | RESPIRATORY_TRACT | Status: AC
  Administered 2012-09-29: 0.5 mg via RESPIRATORY_TRACT

## 2012-09-29 MED ORDER — ALBUTEROL SULFATE (5 MG/ML) 0.5% IN NEBU
INHALATION_SOLUTION | RESPIRATORY_TRACT | Status: AC
  Filled 2012-09-29: qty 1

## 2012-09-29 MED ORDER — METHYLPREDNISOLONE SODIUM SUCC 125 MG IJ SOLR
INTRAMUSCULAR | Status: AC
  Filled 2012-09-29: qty 2

## 2012-09-29 NOTE — ED Provider Notes (Signed)
CSN: [id number]     Arrival date & time 09/29/12  1535 History     First MD Initiated Contact with Patient 09/29/12 1553     Chief Complaint  Patient presents with  . Asthma   (Consider location/radiation/quality/duration/timing/severity/associated sxs/prior Treatment) HPI Comments: 47 year old morbidly obese female with a history of COPD, asthma, continued tobacco addiction and use, CAD with NSTEMI in August of 2012 presents with exacerbation of her respiratory disease. She is complaining of shortness of breath. She is denying chest pain, fever, upper respiratory symptoms or abdominal symptoms. At the workup her pulse oximetry was 83% on room air and elevated to 86% with 2 L of oxygen per minute. She uses oxygen at home.  Patient is a 47 y.o. female presenting with asthma.  Asthma Associated symptoms include shortness of breath. Pertinent negatives include no chest pain.    Past Medical History  Diagnosis Date  . HTN (hypertension)     Has normal renal arteries.  . Cleft palate   . NSTEMI (non-ST elevated myocardial infarction) 09-25-2010  . DYSPNEA ON EXERTION 03/08/2007  . ELECTROCARDIOGRAM, ABNORMAL 02/13/2010  . CAD (coronary artery disease)     NSTEMI with cath in August 2012 with nonobstructive disease, 50% ostial D1 and 70% distal LCX and PL disease. Normal EF. Has diastolic dysfunction with elevated EDP  . Tobacco abuse disorder   . COPD (chronic obstructive pulmonary disease)   . Hyperlipidemia   . Asthma    Past Surgical History  Procedure Laterality Date  . Cesarean section    . Cardiac catheterization  Aug 2012    Moderate nonobstructive multivessel CAD with an EF of 70 to 75%  . Coronary angioplasty with stent placement     Family History  Problem Relation Age of Onset  . Diabetes    . Hypertension    . Asthma Mother   . Asthma Brother   . Heart disease Mother    History  Substance Use Topics  . Smoking status: Current Every Day Smoker -- 0.50 packs/day  for 31 years    Types: Cigarettes  . Smokeless tobacco: Never Used     Comment: 4 CIGS A DAY  . Alcohol Use: No   OB History   Grav Para Term Preterm Abortions TAB SAB Ect Mult Living            1     Review of Systems  Constitutional: Positive for activity change and fatigue.  HENT: Negative.   Respiratory: Positive for cough, shortness of breath and wheezing.   Cardiovascular: Negative for chest pain.  Gastrointestinal: Negative.   Neurological: Negative.     Allergies  Review of patient's allergies indicates no known allergies.  Home Medications   Current Outpatient Rx  Name  Route  Sig  Dispense  Refill  . albuterol (PROAIR HFA) 108 (90 BASE) MCG/ACT inhaler   Inhalation   Inhale 2 puffs into the lungs every 6 (six) hours as needed. Shortness of breath   1 Inhaler   6   . EXPIRED: albuterol (PROVENTIL) (2.5 MG/3ML) 0.083% nebulizer solution   Nebulization   Take 3 mLs (2.5 mg total) by nebulization every 6 (six) hours as needed for wheezing.   75 mL   12   . antipyrine-benzocaine (AURALGAN) otic solution   Right Ear   Place 3 drops into the right ear every 2 (two) hours as needed for pain.   10 mL   0   . aspirin EC 81 MG EC  tablet   Oral   Take 1 tablet (81 mg total) by mouth daily.         . cefUROXime (CEFTIN) 250 MG tablet   Oral   Take 1 tablet (250 mg total) by mouth 2 (two) times daily.   20 tablet   0   . cloNIDine (CATAPRES) 0.1 MG tablet   Oral   Take 0.1 mg by mouth 2 (two) times daily.          . hydrALAZINE (APRESOLINE) 100 MG tablet   Oral   Take 1 tablet (100 mg total) by mouth 3 (three) times daily.   90 tablet   6   . labetalol (NORMODYNE) 300 MG tablet   Oral   Take 1 tablet (300 mg total) by mouth 3 (three) times daily.   90 tablet   5   . losartan (COZAAR) 100 MG tablet   Oral   Take 1 tablet (100 mg total) by mouth daily.   30 tablet   5   . METHADONE HCL PO   Oral   Take 33 mg by mouth every morning.           . Multiple Vitamin (MULTIVITAMIN) tablet   Oral   Take 1 tablet by mouth daily.         Marland Kitchen neomycin-polymyxin-hydrocortisone (CORTISPORIN) 3.5-10000-1 otic suspension   Right Ear   Place 4 drops into the right ear 3 (three) times daily.   10 mL   0   . nitroGLYCERIN (NITROSTAT) 0.4 MG SL tablet   Sublingual   Place 0.4 mg under the tongue every 5 (five) minutes as needed for chest pain.         . Omega-3 Fatty Acids (FISH OIL BURP-LESS PO)   Oral   Take 1 capsule by mouth daily.          . predniSONE (DELTASONE) 20 MG tablet      Take 3 tabs po on first day, 2 tabs second day, 2 tabs third day, 1 tab fourth day, 1 tab 5th day. Take with food.   9 tablet   0   . tiotropium (SPIRIVA) 18 MCG inhalation capsule   Inhalation   Place 1 capsule (18 mcg total) into inhaler and inhale daily.   30 capsule   5    BP 183/97  Pulse 78  Temp(Src) 97.9 F (36.6 C) (Oral)  Resp 16  SpO2 88% Physical Exam  Nursing note and vitals reviewed. Constitutional: She is oriented to person, place, and time. She appears well-developed and well-nourished.  Neck: Normal range of motion. Neck supple.  Cardiovascular: Regular rhythm and normal heart sounds.   Pulmonary/Chest:  Respiratory effort is increased. Breath sounds with diffuse bilateral wheezing and coarseness. Poor air movement and chest expansion.  Lymphadenopathy:    She has no cervical adenopathy.  Neurological: She is alert and oriented to person, place, and time. She exhibits normal muscle tone.  Skin: Skin is warm and dry.  Psychiatric: She has a normal mood and affect.    ED Course   Procedures (including critical care time)  Labs Reviewed - No data to display No results found. 1. Asthma attack   2. COPD exacerbation   3. Tobacco abuse   4. Hypoxemia     MDM  After the DuoNeb and Solu-Medrol IM the patient is feeling much better. She states she is at her usual baseline. Wheezing had substantially decreased  and air movement has improved. She states  she is radiated to home. O2 sats had increased to 90%. She has been advised to continue her oxygen to rest of the day and use it more than just occasionally at night. She has her on nebulizers at home. Prednisone 20 mg tablet taper as directed Per any new symptoms problems or worsening may return or otherwise her to the emergency department. Patient is stable and improved on discharge.  Janne Napoleon, NP 09/29/12 1704

## 2012-09-29 NOTE — ED Notes (Signed)
C/o she is having a flare up of her asthma, last used her breathing machine ~1-2 h ago (albuterol) diffuse wheezing, on home O2 at 2 L/min presenting Sat 83 on room air, went up to 86 on 2L/min

## 2012-09-29 NOTE — ED Provider Notes (Signed)
Medical screening examination/treatment/procedure(s) were performed by non-physician practitioner and as supervising physician I was immediately available for consultation/collaboration.  Philipp Deputy, M.D.  Harden Mo, MD 09/29/12 [phone]

## 2012-10-17 ENCOUNTER — Ambulatory Visit: Payer: Medicare Other | Admitting: Internal Medicine

## 2012-10-19 ENCOUNTER — Ambulatory Visit (INDEPENDENT_AMBULATORY_CARE_PROVIDER_SITE_OTHER): Payer: Medicare Other | Admitting: Internal Medicine

## 2012-10-19 ENCOUNTER — Encounter: Payer: Self-pay | Admitting: Internal Medicine

## 2012-10-19 VITALS — BP 190/120 | HR 92 | Temp 98.0°F | Ht 65.0 in | Wt 175.4 lb

## 2012-10-19 DIAGNOSIS — J441 Chronic obstructive pulmonary disease with (acute) exacerbation: Secondary | ICD-10-CM

## 2012-10-19 MED ORDER — PREDNISONE 10 MG PO TABS: ORAL_TABLET | ORAL | Status: DC

## 2012-10-19 MED ORDER — DOXYCYCLINE HYCLATE 100 MG PO TABS: 100.0000 mg | ORAL_TABLET | Freq: Two times a day (BID) | ORAL | Status: DC

## 2012-10-19 NOTE — Patient Instructions (Addendum)
#  COPD - you are in mild flare up after running out of spiriva and due to virus - Take doxycycline 100mg  po twice daily x 5 days; take after meals and avoid sunlight - Take prednisone 40 mg daily x 2 days, then 20mg  daily x 2 days, then 10mg  daily x 2 days, then 5mg  daily x 2 days and stop - restart spiriva 1 puff daily - continue dulera 2 puff twice daily - use albuterol as needed - continue o2 at night - flu shot nedxt week at [facility] when better - glad you are off lisinopril, will document it  #smoking - please try to quit  #Followup 6-9 months or sooner if needed

## 2012-10-19 NOTE — Progress Notes (Signed)
Subjective:    Patient ID: Karla Nelson, female    DOB: 09-02-1965, 47 y.o.   MRN: [id number]  HPI 47 yo AAF with known hx of COPD ? Asthma component , active smoker.  Karla Nelson 07/2011: FEV1 1.45 (59%), ratio 61 Has O2 at home -wears As needed  And At bedtime    10/04/2011 Acute OV  Returns for persistent cough and wheezing .  Was seen at her asthma and allergist office-Dr. Ishmael Holter- last week with cough and wheezing long with  sob with exertion for  7days . Was started on Steroid taper. Feels this is helping but believes symptoms will come right back as she finishes the steroids (as this has been her past experiences)   Has cough with min productive w/-  thick clear mucus along with nasal drip .  Dr. Ishmael Holter called our office on 10/01/11 and spoke with myself regarding her AB flare .  Recent pre/post spirometry in Dr. Ishmael Holter office shows some reversibility post SABA.  She is now on [place] .   Of note, MAR shows Labetalol 300mg  Three times a day  , Lisinopril daily  Along with active smoker.  We discussed in detail smoking cessation .  Over last 6-9 months has had 4 ER visits for asthma flares along with multiple OV for uncontrolled asthma Uses her rescue inhaler several times a day B/p is very elevated today -pt says she has not taken her meds of yet.  No headache/visual changes or edema   Last cxr ~06/2011 was w/ no acute process   Pt will be changing pulmonary docs -from Dr. [name] to Dr. [name]  Have discussed case with Dr. [name]  >>cont spiriva and [name], stop lisinopril  >benicar    11/04/2011 Follow up  Returns for follow up of COPD /Asthma  Last ov lisinopril stopped and benicar added  She is feeling better but had 1 attack last week w/ ED visit. Tx w/ solumedrol and left AMA .  Felt better w/ decreased wheezing . Had low O2 sat at 86% , Has o2 at home - wears it As needed  And At bedtime   Ran out of spiriva 1 week ago.  Reminded her that her insurance  Medicaid will cover her inhaler as she was depending on samples for these.  She has cut back on smoking down 4 cigs daily .  We discussed her high dose Labetolol. She has had difficult to control b/p and is on 900mg  of Labetolol daily .  She has agreed to make ov with PCP to discuss changing this.  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 No edema.    Flu Shot today  Restart Spiriva 1 puff daily  Continue on Dulera 2 puffs Twice daily  Brush/rinse and gargle after inhaler use.  Remain off LISINOPRIL  Change Benicar to Losartan 100mg  daily (this should be covered on your insurance)  Stop smoking is most important goal.  follow up Dr. [name] in 4 weeks  We are referring you to ENT for open cleft palate.  Please contact office for sooner follow up if symptoms do not improve or worsen or seek emergency care  Set up visit with Dr. Arnoldo Morale regarding you elevated blood pressure And changing Labetolol.    OV 12/02/2011 PCP is [name], MD Cards is Dr [name]  47 year old [name] female. Active SMoker.  On methadone program  for prior heroin addiction, NSTEMI Aug 2012 with diast dysfn, COPD Moderate wit significant BD response but on o2 (uses o2 prn esp at night), Severe hypertenssion wit normal Renal artery on multiple drugs. Also ha hx of cleft palate (dr [name]).  Lives alone with dog (parents and son nearby)  Today OV 12/02/2011: Wants humidifer oxygen due to nasal dryness. Feels she is in mild AEcopd past few days - more wheezing, dyspnea and greenoish sinus drainage. Reports compliance with spiriva and symbicort. COPD CAT score (below) is 32 and reflects heavy disease burden of copd   REC   #COPD flare  Take doxycycline 100mg  po twice daily x 5 days; take after meals and avoid sunlight  Take prednisone 40 mg daily x 2 days, then 20mg  daily x 2 days, then 10mg  daily x 2 days, then 5mg  daily x 2 days and  stop  #COPD  - glad you already had flu shot  - continue spiriva and dulera  - we discussed pulmonary rehab: given your busy schedule we will hold off  -we will change your oxygen set up for humdifier  #Smoking  - please work on quitting smoking  #Followup  4-6 months or sooner if needed  Alpha 1 genetic test at followup    OV 05/17/2012  Followup COPD and smoking  - Still continued to smoke. She is unable to quit. However she has not relapsed into other narcotics. She maintains a methadone. Of interest to note is that she is attending school in setting substance abuse at [place]  - In terms of COPD: Disease is stable COPD cat score is 24 and better than before ; details are below. She is open to attending pulmonary rehabilitation summer of 2014 when she has time off from school. She is noted to be on ACE inhibitor and ARB  - Past, Family, Social reviewed: no change since last visit other than the fact she is not too happy with her primary care physician. Her blood pressure today is high but her systolic and [id number] diastolic but she's not having any symptoms pertaining to the high blood pressure    #COPD  - disesae is stable  - continue spiriva and dulera and oxygen  - Do blood test for alpha 1 genetic cause of COPD  - I have referred you to pulmonary rehabilitation; hopefully can start in the summer  - At fu will discuss coming off ACE inhibitor  #High blood pressure  - Your blood pressure is very high. Talk to your primary care physician about this  - If you have any headaches, double vision, confusion, loss of consciousness or seizures or feeling weird please go to the emergency room  - Please meet with a coordinator to help you set up with a new primary care physician  #Smoking  - please work on quitting smoking  #Followup  4-6 months or sooner if needed  COPD cat score at followup     OV 10/19/2012  Followup COPD in the setting of ACE inhibitor intake  Her medication list  he'll shows that she is on ACE inhibitor but she tells me that she is off it. She in fact confirm that she is on losartan. Overall COPD is stable. However, 3 days ago she ran out of Spiriva. At the same time she picked up a cold and now she's having increased cough from baseline and change in color of sputum to green. Howeverd shortness of breath is still the same. COPD cat score  does not reflect the fact she is on an exacerbation but she does have moderate  symptoms of the Score of 25.  Continues to smoke but will never quit  CAT COPD Symptom & Quality of Life Score (GSK trademark) 0 is no burden. 5 is highest burden 12/02/2011  05/17/2012  10/19/2012   Never Cough -> Cough all the time 5 3 3   No phlegm in chest -> Chest is full of phlegm 1 2 0  No chest tightness -> Chest feels very tight 2 0 1  No dyspnea for 1 flight stairs/hill -> Very dyspneic for 1 flight of stairs 5 3 5   No limitations for ADL at home -> Very limited with ADL at home 5 4 5   Confident leaving home -> Not at all confident leaving home 5 5 3   Sleep soundly -> Do not sleep soundly because of lung condition 5 3 4   Lots of Energy -> No energy at all 4 4 4   TOTAL Score (max 40)  32 24 25     Review of Systems  Constitutional: Negative for fever and unexpected weight change.  HENT: Negative for ear pain, nosebleeds, congestion, sore throat, rhinorrhea, sneezing, trouble swallowing, dental problem, postnasal drip and sinus pressure.   Eyes: Negative for redness and itching.  Respiratory: Positive for cough, chest tightness, shortness of breath and wheezing.   Cardiovascular: Negative for palpitations and leg swelling.  Gastrointestinal: Negative for nausea and vomiting.  Genitourinary: Negative for dysuria.  Musculoskeletal: Negative for joint swelling.  Skin: Negative for rash.  Neurological: Negative for headaches.  Hematological: Does not bruise/bleed easily.  Psychiatric/Behavioral: Negative for dysphoric mood. The  patient is not nervous/anxious.    Current outpatient prescriptions:albuterol (PROAIR HFA) 108 (90 BASE) MCG/ACT inhaler, Inhale 2 puffs into the lungs every 6 (six) hours as needed. Shortness of breath, Disp: 1 Inhaler, Rfl: 6;  aspirin EC 81 MG EC tablet, Take 1 tablet (81 mg total) by mouth daily., Disp: , Rfl: ;  cloNIDine (CATAPRES) 0.1 MG tablet, Take 0.1 mg by mouth 2 (two) times daily. , Disp: , Rfl:  hydrALAZINE (APRESOLINE) 100 MG tablet, Take 1 tablet (100 mg total) by mouth 3 (three) times daily., Disp: 90 tablet, Rfl: 6;  labetalol (NORMODYNE) 300 MG tablet, Take 1 tablet (300 mg total) by mouth 3 (three) times daily., Disp: 90 tablet, Rfl: 5;  lisinopril (PRINIVIL,ZESTRIL) 40 MG tablet, Take 1 tablet by mouth daily., Disp: , Rfl: ;  losartan (COZAAR) 100 MG tablet, Take 1 tablet (100 mg total) by mouth daily., Disp: 30 tablet, Rfl: 5 METHADONE HCL PO, Take 33 mg by mouth every morning. , Disp: , Rfl: ;  Multiple Vitamin (MULTIVITAMIN) tablet, Take 1 tablet by mouth daily., Disp: , Rfl: ;  nitroGLYCERIN (NITROSTAT) 0.4 MG SL tablet, Place 0.4 mg under the tongue every 5 (five) minutes as needed for chest pain., Disp: , Rfl: ;  Omega-3 Fatty Acids (FISH OIL BURP-LESS PO), Take 1 capsule by mouth daily. , Disp: , Rfl:  tiotropium (SPIRIVA) 18 MCG inhalation capsule, Place 1 capsule (18 mcg total) into inhaler and inhale daily., Disp: 30 capsule, Rfl: 5;  albuterol (PROVENTIL) (2.5 MG/3ML) 0.083% nebulizer solution, Take 3 mLs (2.5 mg total) by nebulization every 6 (six) hours as needed for wheezing., Disp: 75 mL, Rfl: 12     Objective:   Physical Exam Filed Vitals:   10/19/12 1426  BP: 190/120  Pulse: 92  Temp: 98 F (36.7 C)  TempSrc: Oral  Height: 5\' 5"  (1.651 m)  Weight: 175 lb 6.4 oz (79.561 kg)  SpO2: 88%     GEN: A/Ox3; pleasant , NAD, well nourished   HEENT:  [place]/AT,  EACs-clear, TMs-wnl, NOSE-clear, THROAT-clear, no lesions, no postnasal drip or exudate noted.  Edentulous  upper (dentures)  Upper palate open midline   NECK:  Supple w/ fair ROM; no JVD; normal carotid impulses w/o bruits; no thyromegaly or nodules palpated; no lymphadenopathy.  RESP  Coarse BS  w/o, wheezes/ rales/ or rhonchi.no accessory muscle use, no dullness to percussion  CARD:  RRR, no m/r/g  , no peripheral edema, pulses intact, no cyanosis or clubbing.  GI:   Soft & nt; nml bowel sounds; no organomegaly or masses detected.  Musco: Warm bil, no deformities or joint swelling noted.   Neuro: alert, no focal deficits noted.    Skin: Warm, no lesions or rashes           Assessment & Plan:

## 2012-10-22 NOTE — Assessment & Plan Note (Signed)
#  COPD - you are in mild flare up after running out of spiriva and due to virus - Take doxycycline 100mg  po twice daily x 5 days; take after meals and avoid sunlight - Take prednisone 40 mg daily x 2 days, then 20mg  daily x 2 days, then 10mg  daily x 2 days, then 5mg  daily x 2 days and stop - restart spiriva 1 puff daily - continue dulera 2 puff twice daily - use albuterol as needed - continue o2 at night - [facility] better - glad you are off lisinopril, will document it  #smoking - please try to quit  #Followup 6-9 months or sooner if needed

## 2012-12-14 ENCOUNTER — Other Ambulatory Visit: Payer: Self-pay

## 2012-12-21 ENCOUNTER — Encounter (HOSPITAL_COMMUNITY): Payer: Self-pay | Admitting: Emergency Medicine

## 2012-12-21 ENCOUNTER — Emergency Department (INDEPENDENT_AMBULATORY_CARE_PROVIDER_SITE_OTHER)
Admission: EM | Admit: 2012-12-21 | Discharge: 2012-12-21 | Disposition: A | Discharge status: Home / Self Care | Payer: Medicare Other | Source: Home / Self Care

## 2012-12-21 DIAGNOSIS — I10 Essential (primary) hypertension: Secondary | ICD-10-CM

## 2012-12-21 DIAGNOSIS — H1089 Other conjunctivitis: Secondary | ICD-10-CM

## 2012-12-21 DIAGNOSIS — A499 Bacterial infection, unspecified: Secondary | ICD-10-CM

## 2012-12-21 DIAGNOSIS — H109 Unspecified conjunctivitis: Secondary | ICD-10-CM

## 2012-12-21 MED ORDER — POLYMYXIN B-TRIMETHOPRIM 10000-0.1 UNIT/ML-% OP SOLN: 1.0000 [drp] | OPHTHALMIC | Status: DC

## 2012-12-21 MED ORDER — DEXAMETHASONE SODIUM PHOSPHATE 0.1 % OP SOLN: 1.0000 [drp] | Freq: Four times a day (QID) | OPHTHALMIC | Status: DC

## 2012-12-21 MED ORDER — CEPHALEXIN 500 MG PO CAPS: 500.0000 mg | ORAL_CAPSULE | Freq: Four times a day (QID) | ORAL | Status: DC

## 2012-12-21 NOTE — ED Provider Notes (Signed)
Medical screening examination/treatment/procedure(s) were performed by resident physician or non-physician practitioner and as supervising physician I was immediately available for consultation/collaboration.   Pauline Good MD.   Billy Fischer, MD 12/21/12 Curly Rim

## 2012-12-21 NOTE — ED Notes (Addendum)
Pt c/o right eye swelling onset 2 days Sxs include: blurry vision, soreness, drainage Denies: f/v/n/d Also, BP today is 216/119... Denies: CP, SOB, nausea, HA, edema, weakness Has not had her BP meds toda; doesn't take them on a regularly  Hx of CAD, heart surg Alert w/no signs of acute distress... Sig other in room w/pt.

## 2012-12-21 NOTE — ED Notes (Signed)
Notified Youlanda Roys, NP about abn vitals.

## 2012-12-21 NOTE — ED Provider Notes (Signed)
CSN: [id number]     Arrival date & time 12/21/12  1745 History   None    Chief Complaint  Patient presents with  . Eye Problem   (Consider location/radiation/quality/duration/timing/severity/associated sxs/prior Treatment) HPI Comments: C/O OS irritation, drainage and swelling for 2 d. Denies known injury, FB or other cause.   Past Medical History  Diagnosis Date  . HTN (hypertension)     Has normal renal arteries.  . Cleft palate   . NSTEMI (non-ST elevated myocardial infarction) 09-25-2010  . DYSPNEA ON EXERTION 03/08/2007  . ELECTROCARDIOGRAM, ABNORMAL 02/13/2010  . CAD (coronary artery disease)     NSTEMI with cath in August 2012 with nonobstructive disease, 50% ostial D1 and 70% distal LCX and PL disease. Normal EF. Has diastolic dysfunction with elevated EDP  . Tobacco abuse disorder   . COPD (chronic obstructive pulmonary disease)   . Hyperlipidemia   . Asthma    Past Surgical History  Procedure Laterality Date  . Cesarean section    . Cardiac catheterization  Aug 2012    Moderate nonobstructive multivessel CAD with an EF of 70 to 75%  . Coronary angioplasty with stent placement     Family History  Problem Relation Age of Onset  . Diabetes    . Hypertension    . Asthma Mother   . Asthma Brother   . Heart disease Mother    History  Substance Use Topics  . Smoking status: Current Every Day Smoker -- 0.50 packs/day for 31 years    Types: Cigarettes  . Smokeless tobacco: Never Used  . Alcohol Use: No   OB History   Grav Para Term Preterm Abortions TAB SAB Ect Mult Living            1     Review of Systems  Constitutional: Negative.   HENT: Negative.   Eyes: Positive for discharge, redness and itching.  All other systems reviewed and are negative.    Allergies  Review of patient's allergies indicates no known allergies.  Home Medications   Current Outpatient Rx  Name  Route  Sig  Dispense  Refill  . cloNIDine (CATAPRES) 0.1 MG tablet   Oral   Take  0.1 mg by mouth 2 (two) times daily.          Marland Kitchen albuterol (PROAIR HFA) 108 (90 BASE) MCG/ACT inhaler   Inhalation   Inhale 2 puffs into the lungs every 6 (six) hours as needed. Shortness of breath   1 Inhaler   6   . EXPIRED: albuterol (PROVENTIL) (2.5 MG/3ML) 0.083% nebulizer solution   Nebulization   Take 3 mLs (2.5 mg total) by nebulization every 6 (six) hours as needed for wheezing.   75 mL   12   . aspirin EC 81 MG EC tablet   Oral   Take 1 tablet (81 mg total) by mouth daily.         . cephALEXin (KEFLEX) 500 MG capsule   Oral   Take 1 capsule (500 mg total) by mouth 4 (four) times daily.   20 capsule   0   . dexamethasone (DECADRON) 0.1 % ophthalmic solution   Right Eye   Place 1 drop into the right eye 4 (four) times daily.   5 mL   0   . doxycycline (VIBRA-TABS) 100 MG tablet   Oral   Take 1 tablet (100 mg total) by mouth 2 (two) times daily.   10 tablet   0   .  hydrALAZINE (APRESOLINE) 100 MG tablet   Oral   Take 1 tablet (100 mg total) by mouth 3 (three) times daily.   90 tablet   6   . labetalol (NORMODYNE) 300 MG tablet   Oral   Take 1 tablet (300 mg total) by mouth 3 (three) times daily.   90 tablet   5   . lisinopril (PRINIVIL,ZESTRIL) 40 MG tablet   Oral   Take 1 tablet by mouth daily.         Marland Kitchen losartan (COZAAR) 100 MG tablet   Oral   Take 1 tablet (100 mg total) by mouth daily.   30 tablet   5   . METHADONE HCL PO   Oral   Take 33 mg by mouth every morning.          . Multiple Vitamin (MULTIVITAMIN) tablet   Oral   Take 1 tablet by mouth daily.         . nitroGLYCERIN (NITROSTAT) 0.4 MG SL tablet   Sublingual   Place 0.4 mg under the tongue every 5 (five) minutes as needed for chest pain.         . Omega-3 Fatty Acids (FISH OIL BURP-LESS PO)   Oral   Take 1 capsule by mouth daily.          . predniSONE (DELTASONE) 10 MG tablet      Take 4 tables for 2 days, 2 tablets for 2 days, 1 tablet for 2 days, 0.5 tablet  for 2 days then stop   15 tablet   0   . tiotropium (SPIRIVA) 18 MCG inhalation capsule   Inhalation   Place 1 capsule (18 mcg total) into inhaler and inhale daily.   30 capsule   5   . trimethoprim-polymyxin b (POLYTRIM) ophthalmic solution   Right Eye   Place 1 drop into the right eye every 4 (four) hours.   10 mL   0    BP 216/119  Pulse 90  Temp(Src) 98.5 F (36.9 C) (Oral)  Resp 18  SpO2 95%  LMP 12/07/2012 Physical Exam  Nursing note and vitals reviewed. Constitutional: She is oriented to person, place, and time. She appears well-developed and well-nourished. No distress.  HENT:  Mouth/Throat: Oropharynx is clear and moist.  Eyes: EOM are normal. Pupils are equal, round, and reactive to light.  Minor scleral redness. Swelling and redness of the lower lid conjunctiva. Upper lid edema. No hyphema, ant chamber clear. Purulent drainage.  Neck: Normal range of motion. Neck supple.  Cardiovascular: Normal rate.   Pulmonary/Chest: Effort normal.  Lymphadenopathy:    She has no cervical adenopathy.  Neurological: She is alert and oriented to person, place, and time. She exhibits normal muscle tone.  Skin: Skin is warm and dry.    ED Course  Procedures (including critical care time) Labs Review Labs Reviewed - No data to display Imaging Review No results found.    MDM   1. Bacterial conjunctivitis of right eye   2. HTN (hypertension)      Keflex 500 qid for 5 d polytrim eye drops Dexamethasone eye drops Take your BP meds when you get home. St she will take them. Pt is noncompliant wit BP mesds  Janne Napoleon, NP 12/21/12 1849  Janne Napoleon, NP 12/21/12 [phone]

## 2013-04-05 ENCOUNTER — Telehealth: Payer: Self-pay | Admitting: Internal Medicine

## 2013-04-05 DIAGNOSIS — J449 Chronic obstructive pulmonary disease, unspecified: Secondary | ICD-10-CM

## 2013-04-05 MED ORDER — TIOTROPIUM BROMIDE MONOHYDRATE 18 MCG IN CAPS: 18.0000 ug | ORAL_CAPSULE | Freq: Every day | RESPIRATORY_TRACT | Status: DC

## 2013-04-05 NOTE — Telephone Encounter (Signed)
[facility], she called for this. i sent it electronically

## 2013-04-06 ENCOUNTER — Telehealth: Payer: Self-pay | Admitting: Internal Medicine

## 2013-04-06 DIAGNOSIS — J449 Chronic obstructive pulmonary disease, unspecified: Secondary | ICD-10-CM

## 2013-04-06 MED ORDER — TIOTROPIUM BROMIDE MONOHYDRATE 18 MCG IN CAPS: 18.0000 ug | ORAL_CAPSULE | Freq: Every day | RESPIRATORY_TRACT | Status: DC

## 2013-04-06 NOTE — Telephone Encounter (Signed)
Refill sent, pt aware. Jennifer Castillo, CMA  

## 2013-04-11 ENCOUNTER — Encounter: Payer: Self-pay | Admitting: Internal Medicine

## 2013-04-11 ENCOUNTER — Ambulatory Visit (INDEPENDENT_AMBULATORY_CARE_PROVIDER_SITE_OTHER): Payer: Medicare Other | Admitting: Internal Medicine

## 2013-04-11 VITALS — BP 140/90 | HR 94 | Ht 64.0 in | Wt 186.4 lb

## 2013-04-11 DIAGNOSIS — J019 Acute sinusitis, unspecified: Secondary | ICD-10-CM | POA: Insufficient documentation

## 2013-04-11 DIAGNOSIS — J310 Chronic rhinitis: Secondary | ICD-10-CM

## 2013-04-11 DIAGNOSIS — J449 Chronic obstructive pulmonary disease, unspecified: Secondary | ICD-10-CM

## 2013-04-11 DIAGNOSIS — J329 Chronic sinusitis, unspecified: Secondary | ICD-10-CM | POA: Insufficient documentation

## 2013-04-11 MED ORDER — DOXYCYCLINE HYCLATE 100 MG PO TABS: 100.0000 mg | ORAL_TABLET | Freq: Two times a day (BID) | ORAL | Status: DC

## 2013-04-11 NOTE — Assessment & Plan Note (Signed)
#  Acute sinutisits with chronic rhinosinusitis Take doxycycline 100mg  po twice daily x 7 days; take after meals and avoid sunlight REfer ENT

## 2013-04-11 NOTE — Assessment & Plan Note (Signed)
 #  COPD  stable but need full PFT for repeat staging Continue spiriva as before Use albuterol prn We can watch you without dulera or symbicort Will refer you to research department about copd research trials   #BP - I think you should try to get a new PCP and then change your bp meds; I do not want to change it given fact you are on 2 meds  #FOllowup  6 months or sooner if needed

## 2013-04-11 NOTE — Progress Notes (Signed)
Subjective:    Patient ID: Karla Nelson, female    DOB: Jul 08, 1965, 48 y.o.   MRN: [id number]  HPI  48 yo AAF with known hx of COPD ? Asthma component , active smoker.  Karla Nelson 07/2011: FEV1 1.45 (59%), ratio 61 Has O2 at home -wears As needed  And At bedtime    10/04/2011 Acute OV  Returns for persistent cough and wheezing .  Was seen at her asthma and allergist office-Dr. Ishmael Holter- last week with cough and wheezing long with  sob with exertion for  7days . Was started on Steroid taper. Feels this is helping but believes symptoms will come right back as she finishes the steroids (as this has been her past experiences)   Has cough with min productive w/-  thick clear mucus along with nasal drip .  Dr. Ishmael Holter called our office on 10/01/11 and spoke with myself regarding her AB flare .  Recent pre/post spirometry in Dr. Ishmael Holter office shows some reversibility post SABA.  She is now on [place] .   Of note, MAR shows Labetalol 300mg  Three times a day  , Lisinopril daily  Along with active smoker.  We discussed in detail smoking cessation .  Over last 6-9 months has had 4 ER visits for asthma flares along with multiple OV for uncontrolled asthma Uses her rescue inhaler several times a day B/p is very elevated today -pt says she has not taken her meds of yet.  No headache/visual changes or edema   Last cxr ~06/2011 was w/ no acute process   Pt will be changing pulmonary docs -from Dr. [name] to Dr. [name]  Have discussed case with Dr. [name]  >>cont spiriva and Karla Nelson, stop lisinopril  >benicar    11/04/2011 Follow up  Returns for follow up of COPD /Asthma  Last ov lisinopril stopped and benicar added  She is feeling better but had 1 attack last week w/ ED visit. Tx w/ solumedrol and left AMA .  Felt better w/ decreased wheezing . Had low O2 sat at 86% , Has o2 at home - wears it As needed  And At bedtime   Ran out of spiriva 1 week ago.  Reminded her that her insurance  Medicaid will cover her inhaler as she was depending on samples for these.  She has cut back on smoking down 4 cigs daily .  We discussed her high dose Labetolol. She has had difficult to control b/p and is on 900mg  of Labetolol daily .  She has agreed to make ov with PCP to discuss changing this.  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 No edema.    Flu Shot today  Restart Spiriva 1 puff daily  Continue on Dulera 2 puffs Twice daily  Brush/rinse and gargle after inhaler use.  Remain off LISINOPRIL  Change Benicar to Losartan 100mg  daily (this should be covered on your insurance)  Stop smoking is most important goal.  follow up Dr. [name] in 4 weeks  We are referring you to ENT for open cleft palate.  Please contact office for sooner follow up if symptoms do not improve or worsen or seek emergency care  Set up visit with Dr. Arnoldo Morale regarding you elevated blood pressure And changing Labetolol.    OV 12/02/2011 PCP is [name], MD Cards is Dr [name]  48 year old [place] female. Active SMoker.  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 Also ha hx of cleft palate (dr Karla Nelson).  Lives alone with dog (parents and son nearby)  Today OV 12/02/2011: Wants humidifer oxygen due to nasal dryness. Feels she is in mild AEcopd past few days - more wheezing, dyspnea and greenoish sinus drainage. Reports compliance with spiriva and symbicort. COPD CAT score (below) is 32 and reflects heavy disease burden of copd   REC   #COPD flare  Take doxycycline 100mg  po twice daily x 5 days; take after meals and avoid sunlight  Take prednisone 40 mg daily x 2 days, then 20mg  daily x 2 days, then 10mg  daily x 2 days, then 5mg  daily x 2 days and  stop  #COPD  - glad you already had flu shot  - continue spiriva and dulera  - we discussed pulmonary rehab: given your busy schedule we will hold off  -we will change your oxygen set up for humdifier  #Smoking  - please work on quitting smoking  #Followup  4-6 months or sooner if needed  Alpha 1 genetic test at followup    OV 05/17/2012  Followup COPD and smoking  - Still continued to smoke. She is unable to quit. However she has not relapsed into other narcotics. She maintains a methadone. Of interest to note is that she is attending school in setting substance abuse at [employer]  - In terms of COPD: Disease is stable COPD cat score is 24 and better than before ; details are below. She is open to attending pulmonary rehabilitation summer of 2014 when she has time off from school. She is noted to be on ACE inhibitor and ARB  - Past, Family, Social reviewed: no change since last visit other than the fact she is not too happy with her primary care physician. Her blood pressure today is high but her systolic and [id number] diastolic but she's not having any symptoms pertaining to the high blood pressure    #COPD  - disesae is stable  - continue spiriva and dulera and oxygen  - Do blood test for alpha 1 genetic cause of COPD  - I have referred you to pulmonary rehabilitation; hopefully can start in the summer  - At fu will discuss coming off ACE inhibitor  #High blood pressure  - Your blood pressure is very high. Talk to your primary care physician about this  - If you have any headaches, double vision, confusion, loss of consciousness or seizures or feeling weird please go to the emergency room  - Please meet with a coordinator to help you set up with a new primary care physician  #Smoking  - please work on quitting smoking  #Followup  4-6 months or sooner if needed  COPD cat score at followup     OV 10/19/2012  Followup COPD in the setting of ACE inhibitor intake  Her medication list  he'll shows that she is on ACE inhibitor but she tells me that she is off it. She in fact confirm that she is on losartan. Overall COPD is stable. However, 3 days ago she ran out of Spiriva. At the same time she picked up a cold and now she's having increased cough from baseline and change in color of sputum to green. Howeverd shortness of breath is still the same. COPD  cat score does not reflect the fact she is on an exacerbation but she does have moderate  symptoms of the Score of 25.  Continues to smoke but will never quit  #COPD - you are in mild flare up after running out of spiriva and due to virus - Take doxycycline 100mg  po twice daily x 5 days; take after meals and avoid sunlight - Take prednisone 40 mg daily x 2 days, then 20mg  daily x 2 days, then 10mg  daily x 2 days, then 5mg  daily x 2 days and stop - restart spiriva 1 puff daily - continue dulera 2 puff twice daily - use albuterol as needed - continue o2 at night - flu shot nedxt week at [facility] when better - glad you are off lisinopril, will document it  #smoking - please try to quit  #Followup 6-9 months or sooner if needed  OV 04/11/2013  Chief Complaint  Patient presents with  . COPD    follow-up. Pt states she has some sinus congestion. Pt denies any cough, she states she has  SOB with exertion.     COPD:  Overall stable. COPD cat score is 25 and symptoms detailed below. Taking only spiriva. Not sure why she is not on ICS/LABA. Not had PFTs since 2013; needs one. Thinks she is uptodate with flu shot. She is interested in both [place] and [employer] COPD research trials; she understands what might be involved  SMoking:  reports that she has been smoking Cigarettes.  She has a 15.5 pack-year smoking history. She has never used smokeless tobacco.  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 Past few days this is worse. THinks she needs antibiotics. Does not have ENT  specialist  Others: PCP shut down practice. Has no PCP. BP meds causing her fatigues; denies being in lisinopril. Not doing narcs; continues methadone   CAT COPD Symptom & Quality of Life Score (GSK trademark) 0 is no burden. 5 is highest burden 12/02/2011  05/17/2012  10/19/2012  04/11/2013   Never Cough -> Cough all the time 5 3 3 5   No phlegm in chest -> Chest is full of phlegm 1 2 0 5  No chest tightness -> Chest feels very tight 2 0 1 4  No dyspnea for 1 flight stairs/hill -> Very dyspneic for 1 flight of stairs 5 3 5 5   No limitations for ADL at home -> Very limited with ADL at home 5 4 5 5   Confident leaving home -> Not at all confident leaving home 5 5 3 1   Sleep soundly -> Do not sleep soundly because of lung condition 5 3 4  0  Lots of Energy -> No energy at all 4 4 4  0  TOTAL Score (max 40)  32 24 25 25        Review of Systems  Constitutional: Negative for fever and unexpected weight change.  HENT: Positive for congestion, postnasal drip and sinus pressure. Negative for dental problem, ear pain, nosebleeds, rhinorrhea, sneezing, sore throat and trouble swallowing.   Eyes: Negative for redness and itching.  Respiratory: Negative for cough, chest tightness, shortness of breath and wheezing.   Cardiovascular: Negative for palpitations and leg swelling.  Gastrointestinal: Negative for nausea and vomiting.  Genitourinary: Negative for dysuria.  Musculoskeletal: Negative for joint swelling.  Skin: Negative for rash.  Neurological: Negative for headaches.  Hematological: Does not bruise/bleed easily.  Psychiatric/Behavioral: Negative for dysphoric mood. The patient is not nervous/anxious.  Objective:   Physical Exam   Filed Vitals:   04/11/13 1429  BP: 140/90  Pulse: 94  Height: 5\' 4"  (1.626 m)  Weight: 84.55 kg (186 lb 6.4 oz)  SpO2: 91%    GEN: A/Ox3; pleasant , NAD, well nourished   HEENT:  [place]/AT,  EACs-clear, TMs-wnl, NOSE-clear, THROAT-clear, no  lesions, no postnasal drip or exudate noted.  Edentulous upper (dentures)  Upper palate open midline   NECK:  Supple w/ fair ROM; no JVD; normal carotid impulses w/o bruits; no thyromegaly or nodules palpated; no lymphadenopathy.  RESP  Coarse BS  w/o, wheezes/ rales/ or rhonchi.no accessory muscle use, no dullness to percussion  CARD:  RRR, no m/r/g  , no peripheral edema, pulses intact, no cyanosis or clubbing.  GI:   Soft & nt; nml bowel sounds; no organomegaly or masses detected.  Musco: Warm bil, no deformities or joint swelling noted.   Neuro: alert, no focal deficits noted.    Skin: Warm, no lesions or rashes        Assessment & Plan:

## 2013-04-11 NOTE — Patient Instructions (Addendum)
#  Acute sinutisits with chronic rhinosinusitis Take doxycycline 100mg  po twice daily x 7 days; take after meals and avoid sunlight REfer ENT  #COPD  stable but need full PFT for repeat staging Continue spiriva as before Use albuterol prn We can watch you without dulera or symbicort Will refer you to research department about copd research trials  #SMoking  - will discuss at future visit to quit smoking  #BP - I think you should try to get a new PCP and then change your bp meds; I do not want to change it given fact you are on 2 meds  #FOllowup  6 months or sooner if needed

## 2013-04-13 ENCOUNTER — Ambulatory Visit: Payer: Medicare Other | Admitting: Cardiovascular Disease

## 2013-04-26 ENCOUNTER — Telehealth: Payer: Self-pay | Admitting: Internal Medicine

## 2013-04-26 NOTE — Telephone Encounter (Signed)
No documentation in patient's chart of our office trying to call her.  Called spoke with family member, pt not there at this time.  Per family member call pt at the above number tomorrow morning.

## 2013-04-27 NOTE — Telephone Encounter (Signed)
I do not see anything where we called the pt. I advised the [place], CMA

## 2013-06-20 ENCOUNTER — Telehealth: Payer: Self-pay | Admitting: Internal Medicine

## 2013-06-20 NOTE — Telephone Encounter (Signed)
Could you please set up full PFT for this patient 1st available fine and then see me or TP after that.  She has not had pft since 2013 and I need to ddx asthma v  Copd and stage it  Thanks  Dr. Brand Males, M.D., [facility].C.P Pulmonary and Critical Care Medicine Staff Physician [facility] Pager: [phone], If no answer or between  15:00h - 7:00h: call [phone]  06/20/2013 4:53 PM

## 2013-06-21 NOTE — Telephone Encounter (Signed)
Appt set for 07-25-13 with PFT prior. [place] Bing, CMA

## 2013-07-17 ENCOUNTER — Other Ambulatory Visit: Payer: Self-pay | Admitting: Internal Medicine

## 2013-07-25 ENCOUNTER — Ambulatory Visit (INDEPENDENT_AMBULATORY_CARE_PROVIDER_SITE_OTHER): Payer: Medicare Other | Admitting: Internal Medicine

## 2013-07-25 ENCOUNTER — Ambulatory Visit (INDEPENDENT_AMBULATORY_CARE_PROVIDER_SITE_OTHER)
Admission: RE | Admit: 2013-07-25 | Discharge: 2013-07-25 | Disposition: A | Discharge status: Home / Self Care | Payer: Medicare Other | Source: Ambulatory Visit | Attending: Internal Medicine | Admitting: Internal Medicine

## 2013-07-25 ENCOUNTER — Encounter: Payer: Self-pay | Admitting: Internal Medicine

## 2013-07-25 VITALS — BP 140/92 | HR 84 | Ht 63.25 in | Wt 178.0 lb

## 2013-07-25 DIAGNOSIS — J45909 Unspecified asthma, uncomplicated: Secondary | ICD-10-CM | POA: Diagnosis not present

## 2013-07-25 DIAGNOSIS — J449 Chronic obstructive pulmonary disease, unspecified: Secondary | ICD-10-CM

## 2013-07-25 DIAGNOSIS — J189 Pneumonia, unspecified organism: Secondary | ICD-10-CM | POA: Diagnosis not present

## 2013-07-25 DIAGNOSIS — J4540 Moderate persistent asthma, uncomplicated: Secondary | ICD-10-CM

## 2013-07-25 LAB — PULMONARY FUNCTION TEST
DL/VA % pred: 143 %
DL/VA: 6.74 ml/min/mmHg/L
DLCO UNC: 27.3 ml/min/mmHg
DLCO unc % pred: 117 %
FEF 25-75 POST: 1.19 L/s
FEF 25-75 Pre: 0.79 L/sec
FEF2575-%Change-Post: 50 %
FEF2575-%Pred-Post: 49 %
FEF2575-%Pred-Pre: 32 %
FEV1-%CHANGE-POST: 12 %
FEV1-%PRED-POST: 70 %
FEV1-%Pred-Pre: 62 %
FEV1-POST: 1.6 L
FEV1-Pre: 1.42 L
FEV1FVC-%CHANGE-POST: 5 %
FEV1FVC-%Pred-Pre: 81 %
FEV6-%Change-Post: 6 %
FEV6-%PRED-PRE: 77 %
FEV6-%Pred-Post: 82 %
FEV6-PRE: 2.12 L
FEV6-Post: 2.26 L
FEV6FVC-%Change-Post: 0 %
FEV6FVC-%PRED-POST: 102 %
FEV6FVC-%Pred-Pre: 101 %
FVC-%CHANGE-POST: 6 %
FVC-%Pred-Post: 80 %
FVC-%Pred-Pre: 76 %
FVC-PRE: 2.15 L
FVC-Post: 2.28 L
PRE FEV1/FVC RATIO: 66 %
PRE FEV6/FVC RATIO: 99 %
Post FEV1/FVC ratio: 70 %
Post FEV6/FVC ratio: 99 %
RV % PRED: 202 %
RV: 3.54 L
TLC % pred: 120 %
TLC: 5.94 L

## 2013-07-25 MED ORDER — MOMETASONE FURO-FORMOTEROL FUM 100-5 MCG/ACT IN AERO: 2.0000 | INHALATION_SPRAY | Freq: Two times a day (BID) | RESPIRATORY_TRACT | Status: DC

## 2013-07-25 NOTE — Patient Instructions (Addendum)
#  pneumonia may 2014  - resolved cxr 07/25/13. No further followup  #ASthma/COPD  - more likely asthma but you can head into copd if you do not quit smoking - PFT test shows moderate severity of disease with room for improvement  -continue spiriva  - restart dulera 100/5, 2 puff twice daily (changed to advair 100/50, 1puff bid due to insurance reasons)  - use albuterol as needed  - quit smoking  #FOllowup   3 months or sooner if needed Office spirometry at followup

## 2013-07-25 NOTE — Progress Notes (Signed)
Subjective:    Patient ID: Karla Nelson, female    DOB: September 15, 1965, 48 y.o.   MRN: [id number]  HPI   48 yo AAF with known hx of COPD ? Asthma component , active smoker.  Karla Nelson 07/2011: FEV1 1.45 (59%), ratio 61 Has O2 at home -wears As needed  And At bedtime    10/04/2011 Acute OV  Returns for persistent cough and wheezing .  Was seen at her asthma and allergist office-Dr. Ishmael Holter- last week with cough and wheezing long with  sob with exertion for  7days . Was started on Steroid taper. Feels this is helping but believes symptoms will come right back as she finishes the steroids (as this has been her past experiences)   Has cough with min productive w/-  thick clear mucus along with nasal drip .  Dr. Ishmael Holter called our office on 10/01/11 and spoke with myself regarding her AB flare .  Recent pre/post spirometry in Dr. Ishmael Holter office shows some reversibility post SABA.  She is now on [place] .   Of note, MAR shows Labetalol 300mg  Three times a day  , Lisinopril daily  Along with active smoker.  We discussed in detail smoking cessation .  Over last 6-9 months has had 4 ER visits for asthma flares along with multiple OV for uncontrolled asthma Uses her rescue inhaler several times a day B/p is very elevated today -pt says she has not taken her meds of yet.  No headache/visual changes or edema   Last cxr ~06/2011 was w/ no acute process   Pt will be changing pulmonary docs -from Dr. [name] to Dr. [name]  Have discussed case with Dr. [name]  >>cont spiriva and [name], stop lisinopril  >benicar    11/04/2011 Follow up  Returns for follow up of COPD /Asthma  Last ov lisinopril stopped and benicar added  She is feeling better but had 1 attack last week w/ ED visit. Tx w/ solumedrol and left AMA .  Felt better w/ decreased wheezing . Had low O2 sat at 86% , Has o2 at home - wears it As needed  And At bedtime   Ran out of spiriva 1 week ago.  Reminded her that her insurance  Medicaid will cover her inhaler as she was depending on samples for these.  She has cut back on smoking down 4 cigs daily .  We discussed her high dose Labetolol. She has had difficult to control b/p and is on 900mg  of Labetolol daily .  She has agreed to make ov with PCP to discuss changing this.  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 No edema.    Flu Shot today  Restart Spiriva 1 puff daily  Continue on Dulera 2 puffs Twice daily  Brush/rinse and gargle after inhaler use.  Remain off LISINOPRIL  Change Benicar to Losartan 100mg  daily (this should be covered on your insurance)  Stop smoking is most important goal.  follow up Dr. [name] in 4 weeks  We are referring you to ENT for open cleft palate.  Please contact office for sooner follow up if symptoms do not improve or worsen or seek emergency care  Set up visit with Dr. Arnoldo Morale regarding you elevated blood pressure And changing Labetolol.    OV 12/02/2011 PCP is Karla Millard, MD Cards is Dr Karla Nelson  48 year old [place] female. Active SMoker.  On methadone program for prior heroin addiction, NSTEMI Aug 2012 with diast dysfn, COPD Moderate wit significant BD response but on o2 (uses o2 prn esp at night), Severe hypertenssion wit normal Renal artery on multiple drugs. Also ha hx of cleft palate (dr [name]).  Lives alone with dog (parents and son nearby)  Today OV 12/02/2011: Wants humidifer oxygen due to nasal dryness. Feels she is in mild AEcopd past few days - more wheezing, dyspnea and greenoish sinus drainage. Reports compliance with spiriva and symbicort. COPD CAT score (below) is 32 and reflects heavy disease burden of copd   REC   #COPD flare  Take doxycycline 100mg  po twice daily x 5 days; take after meals and avoid sunlight  Take prednisone 40 mg daily x 2 days, then 20mg  daily x 2 days, then 10mg  daily x 2 days, then 5mg  daily x 2 days and  stop  #COPD  - glad you already had flu shot  - continue spiriva and dulera  - we discussed pulmonary rehab: given your busy schedule we will hold off  -we will change your oxygen set up for humdifier  #Smoking  - please work on quitting smoking  #Followup  4-6 months or sooner if needed  Alpha 1 genetic test at followup    OV 05/17/2012  Followup COPD and smoking  - Still continued to smoke. She is unable to quit. However she has not relapsed into other narcotics. She maintains a methadone. Of interest to note is that she is attending school in setting substance abuse at [place]  - In terms of COPD: Disease is stable COPD cat score is 24 and better than before ; details are below. She is open to attending pulmonary rehabilitation summer of 2014 when she has time off from school. She is noted to be on ACE inhibitor and ARB  - Past, Family, Social reviewed: no change since last visit other than the fact she is not too happy with her primary care physician. Her blood pressure today is high but her systolic and [id number] diastolic but she's not having any symptoms pertaining to the high blood pressure    #COPD  - disesae is stable  - continue spiriva and dulera and oxygen  - Do blood test for alpha 1 genetic cause of COPD  - I have referred you to pulmonary rehabilitation; hopefully can start in the summer  - At fu will discuss coming off ACE inhibitor  #High blood pressure  - Your blood pressure is very high. Talk to your primary care physician about this  - If you have any headaches, double vision, confusion, loss of consciousness or seizures or feeling weird please go to the emergency room  - Please meet with a coordinator to help you set up with a new primary care physician  #Smoking  - please work on quitting smoking  #Followup  4-6 months or sooner if needed  COPD cat score at followup     OV 10/19/2012  Followup COPD in the setting of ACE inhibitor intake  Her medication list  he'll shows that she is on ACE inhibitor but she tells me that she is off it. She in fact confirm that she is on losartan. Overall COPD is stable. However, 3 days ago she ran out of Spiriva. At the same time she picked up a cold and now she's having increased cough from baseline and change in color of sputum to green. Howeverd shortness of breath is still the same.  COPD cat score does not reflect the fact she is on an exacerbation but she does have moderate  symptoms of the Score of 25.  Continues to smoke but will never quit  #COPD - you are in mild flare up after running out of spiriva and due to virus - Take doxycycline 100mg  po twice daily x 5 days; take after meals and avoid sunlight - Take prednisone 40 mg daily x 2 days, then 20mg  daily x 2 days, then 10mg  daily x 2 days, then 5mg  daily x 2 days and stop - restart spiriva 1 puff daily - continue dulera 2 puff twice daily - use albuterol as needed - continue o2 at night - flu shot nedxt week at [facility] when better - glad you are off lisinopril, will document it  #smoking - please try to quit  #Followup 6-9 months or sooner if needed  OV 04/11/2013  Chief Complaint  Patient presents with  . COPD    follow-up. Pt states she has some sinus congestion. Pt denies any cough, she states she has  SOB with exertion.     COPD:  Overall stable. COPD cat score is 25 and symptoms detailed below. Taking only spiriva. Not sure why she is not on ICS/LABA. Not had PFTs since 2013; needs one. Thinks she is uptodate with flu shot. She is interested in both [place] and IMPACT COPD research trials; she understands what might be involved  SMoking:  reports that she has been smoking Cigarettes.  She has a 15.5 pack-year smoking history. She has never used smokeless tobacco.  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 Past few days this is worse. THinks she needs antibiotics. Does not have ENT  specialist  Others: PCP shut down practice. Has no PCP. BP meds causing her fatigues; denies being in lisinopril. Not doing narcs; continues methadone   REC #Acute sinutisits with chronic rhinosinusitis Take doxycycline 100mg  po twice daily x 7 days; take after meals and avoid sunlight REfer ENT  #COPD  stable but need full PFT for repeat staging Continue spiriva as before Use albuterol prn We can watch you without dulera or symbicort Will refer you to research department about copd research trials  #SMoking  - will discuss at future visit to quit smoking  #BP - I think you should try to get a new PCP and then change your bp meds; I do not want to change it given fact you are on 2 meds  #FOllowup  6 months or sooner if needed    OV 07/25/2013  Chief Complaint  Patient presents with  . Follow-up    PFT done today--Denies any sob, cough or wheezing     Followup COPD/asthma: After last visit I relies that she had not had breathing tests in a long time. Therefore I had her do pulmonary function tests and return for followup. PFT shows moderate obstructive lung disease with bronchodilator reversibility but normal diffusion capacity. This is consistent with asthma as a posterior COPD. FEV1 is 1.4 L/62%. Postbronchodilator FEV1 is 1.6 L/70%. This is a 12% response. DLCO is 117%. FEV1 FVC ratio is 66 and consistent with obstruction prebronchodilator. She reports stable health.  Smoking: I ascertained her smoking history again. She has smoked three fourths pack of cigarettes a day since age 89 to age 21. 26.25 pack history This limited smoking history is more consistent with asthma but may be a component of COPD  Sinus issue: Currently stable  New issue: I reviewed her past imaging and noticed that in May 2014 she had a chest x-ray with opacity in the medial left lower lobe. Since then she has not had follow film. However she reports stable health. CXR today (after she left) show is  resolved. No new infiltrates  Dg Chest 2 View  07/25/2013   CLINICAL DATA:  Annual checkup, history of asthma  EXAM: CHEST  2 VIEW  COMPARISON:  06/16/2012  FINDINGS: The cardiac shadow is stable. The previously seen rounded density posterior to the heart in the left lung is no longer identified. No focal infiltrate or sizable effusion is seen. No bony abnormality is noted.  IMPRESSION: No active cardiopulmonary disease.   Electronically Signed   By: Karla Catalina M.D.   On: 07/25/2013 16:06     CAT COPD Symptom & Quality of Life Score ([place]) 0 is no burden. 5 is highest burden 12/02/2011  05/17/2012  10/19/2012  04/11/2013   Never Cough -> Cough all the time 5 3 3 5   No phlegm in chest -> Chest is full of phlegm 1 2 0 5  No chest tightness -> Chest feels very tight 2 0 1 4  No dyspnea for 1 flight stairs/hill -> Very dyspneic for 1 flight of stairs 5 3 5 5   No limitations for ADL at home -> Very limited with ADL at home 5 4 5 5   Confident leaving home -> Not at all confident leaving home 5 5 3 1   Sleep soundly -> Do not sleep soundly because of lung condition 5 3 4  0  Lots of Energy -> No energy at all 4 4 4  0  TOTAL Score (max 40)  32 24 25 25     Review of Systems  Constitutional: Negative for fever and unexpected weight change.  HENT: Positive for congestion and sinus pressure. Negative for dental problem, ear pain, nosebleeds, postnasal drip, rhinorrhea, sneezing, sore throat and trouble swallowing.   Eyes: Negative for redness and itching.  Respiratory: Negative for cough, chest tightness, shortness of breath and wheezing.   Cardiovascular: Positive for leg swelling. Negative for palpitations.  Gastrointestinal: Negative for nausea and vomiting.  Genitourinary: Negative for dysuria.  Musculoskeletal: Negative for joint swelling.  Skin: Negative for rash.  Neurological: Negative for headaches.  Hematological: Does not bruise/bleed easily.  Psychiatric/Behavioral: Negative  for dysphoric mood. The patient is not nervous/anxious.        Objective:   Physical Exam  Filed Vitals:   07/25/13 1334  BP: 140/92  Pulse: 84  Height: 5' 3.25" (1.607 m)  Weight: 178 lb (80.74 kg)  SpO2: 91%     GEN: A/Ox3; pleasant , NAD, well nourished   HEENT:  [place]/AT,  EACs-clear, TMs-wnl, NOSE-clear, THROAT-clear, no lesions, no postnasal drip or exudate noted.  Edentulous upper (dentures)  Upper palate open midline   moder NECK:  Supple w/ fair ROM; no JVD; normal carotid impulses w/o bruits; no thyromegaly or nodules palpated; no lymphadenopathy.  RESP  Coarse BS  w/o, wheezes/ rales/ or rhonchi.no accessory muscle use, no dullness to percussion  CARD:  RRR, no m/r/g  , no peripheral edema, pulses intact, no cyanosis or clubbing.  GI:   Soft & nt; nml bowel sounds; no organomegaly or masses detected.  Musco: Warm bil, no deformities or joint swelling noted.   Neuro: alert, no focal deficits noted.    Skin: Warm, no lesions or rashes         Assessment &  Plan:  #pneumonia may 2014  - cleared on cxr 07/25/13. No further followup  #ASthma/COPD  - more likely asthma but you can head into copd if you do not quit smoking - PFT test shows moderate severity of disease with room for improvement  -continue spiriva  - restart dulera 100/5, 2 puff twice daily (changed to advair 100/50, 1puff bid due to insurance reasons)  - use albuterol as needed  - quit smoking  #FOllowup   3 months or sooner if needed Office spirometry at followup

## 2013-07-25 NOTE — Progress Notes (Signed)
PFT done today. 

## 2013-07-26 ENCOUNTER — Telehealth: Payer: Self-pay | Admitting: *Deleted

## 2013-07-26 DIAGNOSIS — J4540 Moderate persistent asthma, uncomplicated: Secondary | ICD-10-CM | POA: Insufficient documentation

## 2013-07-26 MED ORDER — FLUTICASONE-SALMETEROL 100-50 MCG/DOSE IN AEPB: 1.0000 | INHALATION_SPRAY | Freq: Two times a day (BID) | RESPIRATORY_TRACT | Status: DC

## 2013-07-26 NOTE — Telephone Encounter (Signed)
Called the insurance company to see if they will cover the advair 100 per MR recs.  If they will then we can call this in for the pt but if not then we will need to do the PA for the dulera.    i called and they will cover the advair 100 so this will be called into the pharmacy.   [email or web address] to explain the change from dulera to advair.  Will forward to elise to make her aware and to follow up on.

## 2013-07-26 NOTE — Assessment & Plan Note (Signed)
#  ASthma/COPD  - more likely asthma but you can head into copd if you do not quit smoking - PFT test shows moderate severity of disease with room for improvement  -continue spiriva  - restart dulera 100/5, 2 puff twice daily (changed to advair 100/50, 1puff bid due to insurance reasons)  - use albuterol as needed  - quit smoking  #FOllowup   3 months or sooner if needed Office spirometry at followup

## 2013-07-26 NOTE — Assessment & Plan Note (Signed)
#  pneumonia may 2014  - resolved cxr 07/25/13. No further followup

## 2013-08-02 NOTE — Telephone Encounter (Signed)
Called and spoke to pt. Informed pt of the change from [facility] to Advair, pt aware of frequency of use. Informed her the Rx has already been sent in. Pt verbalized understanding and stated the pharmacy called her and stated her rx is ready for pick up. Pt denied any further questions or concerns at this time.

## 2013-11-18 ENCOUNTER — Other Ambulatory Visit: Payer: Self-pay | Admitting: Internal Medicine

## 2014-02-05 ENCOUNTER — Encounter (HOSPITAL_COMMUNITY): Payer: Self-pay | Admitting: Emergency Medicine

## 2014-02-05 ENCOUNTER — Emergency Department (INDEPENDENT_AMBULATORY_CARE_PROVIDER_SITE_OTHER)
Admission: EM | Admit: 2014-02-05 | Discharge: 2014-02-05 | Disposition: A | Discharge status: Home / Self Care | Payer: Medicare Other | Source: Home / Self Care | Attending: Family Medicine | Admitting: Family Medicine

## 2014-02-05 DIAGNOSIS — H01006 Unspecified blepharitis left eye, unspecified eyelid: Secondary | ICD-10-CM

## 2014-02-05 MED ORDER — POLYMYXIN B-TRIMETHOPRIM 10000-0.1 UNIT/ML-% OP SOLN
2.0000 [drp] | Freq: Four times a day (QID) | OPHTHALMIC | Status: DC
Start: 2014-02-05 — End: 2014-02-05

## 2014-02-05 MED ORDER — POLYMYXIN B-TRIMETHOPRIM 10000-0.1 UNIT/ML-% OP SOLN: 2.0000 [drp] | Freq: Four times a day (QID) | OPHTHALMIC | Status: DC

## 2014-02-05 NOTE — Discharge Instructions (Signed)
Blepharitis Blepharitis is redness, soreness, and swelling (inflammation) of one or both eyelids. It may be caused by an allergic reaction or a bacterial infection. Blepharitis may also be associated with reddened, scaly skin (seborrhea) of the scalp and eyebrows. While you sleep, eye discharge may cause your eyelashes to stick together. Your eyelids may itch, burn, swell, and may lose their lashes. These will grow back. Your eyes may become sensitive. Blepharitis may recur and need repeated treatment. If this is the case, you may require further evaluation by an eye specialist (ophthalmologist). HOME CARE INSTRUCTIONS   Keep your hands clean.  Use a clean towel each time you dry your eyelids. Do not use this towel to clean other areas. Do not share a towel or makeup with anyone.  Wash your eyelids with warm water or warm water mixed with a small amount of baby shampoo. Do this twice a day or as often as needed.  Wash your face and eyebrows at least once a day.  Use warm compresses 2 times a day for 10 minutes at a time, or as directed by your caregiver.  Apply antibiotic ointment as directed by your caregiver.  Avoid rubbing your eyes.  Avoid wearing makeup until you get better.  Follow up with your caregiver as directed. SEEK IMMEDIATE MEDICAL CARE IF:   You have pain, redness, or swelling that gets worse or spreads to other parts of your face.  Your vision changes, or you have pain when looking at lights or moving objects.  You have a fever.  Your symptoms continue for longer than 2 to 4 days or become worse. MAKE SURE YOU:   Understand these instructions.  Will watch your condition.  Will get help right away if you are not doing well or get worse. Document Released: 01/23/2000 Document Revised: 04/19/2011 Document Reviewed: 03/04/2010 ExitCare Patient Information 2015 [employer]. This information is not intended to replace advice given to you by your health care  provider. Make sure you discuss any questions you have with your health care provider.  

## 2014-02-05 NOTE — ED Provider Notes (Signed)
CSN: [id number]     Arrival date & time 02/05/14  [id number] History   First MD Initiated Contact with Patient 02/05/14 [phone]     Chief Complaint  Patient presents with  . Eye Problem   (Consider location/radiation/quality/duration/timing/severity/associated sxs/prior Treatment) HPI Comments: Patient reports 3 days of left upper and lower eyelid irritation, swelling and yellow drainage. Denies fever, chills, eye pain, injury, or vision changes. No contact lens use.  Works at [employer] Smoker  Patient is a 48 y.o. female presenting with eye problem. The history is provided by the patient.  Eye Problem   Past Medical History  Diagnosis Date  . HTN (hypertension)     Has normal renal arteries.  . Cleft palate   . NSTEMI (non-ST elevated myocardial infarction) 09-25-2010  . DYSPNEA ON EXERTION 03/08/2007  . ELECTROCARDIOGRAM, ABNORMAL 02/13/2010  . CAD (coronary artery disease)     NSTEMI with cath in August 2012 with nonobstructive disease, 50% ostial D1 and 70% distal LCX and PL disease. Normal EF. Has diastolic dysfunction with elevated EDP  . Tobacco abuse disorder   . COPD (chronic obstructive pulmonary disease)   . Hyperlipidemia   . Asthma    Past Surgical History  Procedure Laterality Date  . Cesarean section    . Cardiac catheterization  Aug 2012    Moderate nonobstructive multivessel CAD with an EF of 70 to 75%  . Coronary angioplasty with stent placement     Family History  Problem Relation Age of Onset  . Diabetes    . Hypertension    . Asthma Mother   . Asthma Brother   . Heart disease Mother    History  Substance Use Topics  . Smoking status: Current Every Day Smoker -- 0.50 packs/day for 31 years    Types: Cigarettes  . Smokeless tobacco: Never Used  . Alcohol Use: No   OB History    Gravida Para Term Preterm AB TAB SAB Ectopic Multiple Living            1     Review of Systems  All other systems reviewed and are negative.   Allergies  Review of patient's  allergies indicates no known allergies.  Home Medications   Prior to Admission medications   Medication Sig Start Date End Date Taking? Authorizing Provider  amLODipine (NORVASC) 5 MG tablet Take 5 mg by mouth daily.   Yes Historical Provider, MD  hydrALAZINE (APRESOLINE) 100 MG tablet Take 1 tablet (100 mg total) by mouth 3 (three) times daily. 09/04/12  Yes Burtis Junes, NP  labetalol (NORMODYNE) 300 MG tablet Take 1 tablet (300 mg total) by mouth 3 (three) times daily. 06/19/12  Yes Marianne L York, PA-C  losartan (COZAAR) 100 MG tablet Take 1 tablet (100 mg total) by mouth daily. 06/19/12  Yes Marianne L York, PA-C  METHADONE HCL PO Take 33 mg by mouth every morning.    Yes Historical Provider, MD  Multiple Vitamin (MULTIVITAMIN) tablet Take 1 tablet by mouth daily.   Yes Historical Provider, MD  Omega-3 Fatty Acids (FISH OIL BURP-LESS PO) Take 1 capsule by mouth daily.    Yes Historical Provider, MD  OXYGEN Inhale into the lungs. At night   Yes Historical Provider, MD  POTASSIUM CHLORIDE PO Take by mouth.   Yes Historical Provider, MD  PROAIR HFA 108 (90 BASE) MCG/ACT inhaler USE 2 PUFFS BY MOUTH EVERY 6 HOURS AS NEEDED FOR SHORTNESS OF BREATH 07/17/13  Yes Brand Males, MD  SPIRIVA HANDIHALER 18 MCG inhalation capsule INHALE 1 CAPSULE VIA HANDIHALER ONCE DAILY AT THE SAME TIME EVERY DAY 11/22/13  Yes Brand Males, MD  albuterol (PROVENTIL) (2.5 MG/3ML) 0.083% nebulizer solution Take 3 mLs (2.5 mg total) by nebulization every 6 (six) hours as needed for wheezing. 06/21/11   Julieta Bellini, NP  aspirin EC 81 MG EC tablet Take 1 tablet (81 mg total) by mouth daily. 06/19/12   Bobby Rumpf York, PA-C  cloNIDine (CATAPRES) 0.1 MG tablet Take 0.1 mg by mouth 2 (two) times daily.  11/11/11   Historical Provider, MD  Fluticasone-Salmeterol (ADVAIR DISKUS) 100-50 MCG/DOSE AEPB Inhale 1 puff into the lungs 2 (two) times daily. 07/26/13   Brand Males, MD  lisinopril (PRINIVIL,ZESTRIL) 40 MG  tablet Take 1 tablet by mouth daily. 10/05/12   Historical Provider, MD  mometasone-formoterol (DULERA) 100-5 MCG/ACT AERO Inhale 2 puffs into the lungs 2 (two) times daily. 07/25/13   Brand Males, MD  nitroGLYCERIN (NITROSTAT) 0.4 MG SL tablet Place 0.4 mg under the tongue every 5 (five) minutes as needed for chest pain.    Historical Provider, MD  trimethoprim-polymyxin b (POLYTRIM) ophthalmic solution Place 2 drops into the left eye every 6 (six) hours. X 7 days 02/05/14   Annett Gula H Presson, PA   BP 173/91 mmHg  Pulse 87  Temp(Src) 98.3 F (36.8 C) (Oral)  Resp 16  SpO2 97%  LMP 01/09/2014 Physical Exam  Constitutional: She is oriented to person, place, and time. She appears well-developed and well-nourished.  HENT:  Head: Normocephalic and atraumatic.  Eyes: Conjunctivae and EOM are normal. Pupils are equal, round, and reactive to light. Lids are everted and swept, no foreign bodies found. Right eye exhibits no chemosis, no discharge, no exudate and no hordeolum. No foreign body present in the right eye. Left eye exhibits discharge and exudate. Left eye exhibits no hordeolum. No foreign body present in the left eye. No scleral icterus.  +mild erythema and STS of left upper and lower eyelids.  Cardiovascular: Normal rate.   Pulmonary/Chest: Effort normal.  Neurological: She is alert and oriented to person, place, and time.  Skin: Skin is warm and dry.  Psychiatric: She has a normal mood and affect. Her behavior is normal.  Nursing note and vitals reviewed.   ED Course  Procedures (including critical care time) Labs Review Labs Reviewed - No data to display  Imaging Review No results found.   MDM   1. Blepharitis, left   Polytrim opth as directed with follow up if no improvement.     Lutricia Feil, [place] 02/05/14 1046

## 2014-03-15 ENCOUNTER — Encounter: Payer: Self-pay | Admitting: Internal Medicine

## 2014-03-15 ENCOUNTER — Ambulatory Visit (INDEPENDENT_AMBULATORY_CARE_PROVIDER_SITE_OTHER): Payer: Medicare Other | Admitting: Internal Medicine

## 2014-03-15 VITALS — BP 202/110 | HR 98 | Ht 63.0 in | Wt 182.0 lb

## 2014-03-15 DIAGNOSIS — J441 Chronic obstructive pulmonary disease with (acute) exacerbation: Secondary | ICD-10-CM

## 2014-03-15 DIAGNOSIS — F17200 Nicotine dependence, unspecified, uncomplicated: Secondary | ICD-10-CM | POA: Insufficient documentation

## 2014-03-15 DIAGNOSIS — Z720 Tobacco use: Secondary | ICD-10-CM

## 2014-03-15 DIAGNOSIS — F1491 Cocaine use, unspecified, in remission: Secondary | ICD-10-CM | POA: Insufficient documentation

## 2014-03-15 DIAGNOSIS — F1410 Cocaine abuse, uncomplicated: Secondary | ICD-10-CM

## 2014-03-15 DIAGNOSIS — Z87898 Personal history of other specified conditions: Secondary | ICD-10-CM

## 2014-03-15 MED ORDER — DOXYCYCLINE HYCLATE 100 MG PO TABS: ORAL_TABLET | ORAL | Status: DC

## 2014-03-15 MED ORDER — PREDNISONE 10 MG PO TABS
ORAL_TABLET | ORAL | Status: DC
Start: 2014-03-15 — End: 2014-08-31

## 2014-03-15 NOTE — Patient Instructions (Addendum)
ICD-9-CM ICD-10-CM   1. COPD exacerbation 491.21 J44.1   2. Smoker 305.1 Z72.0   3. History of cocaine use 305.63 F14.10      #ASthma/COPD  - you are in flare up - -continue spiriva  - contnue advair  - use albuterol as needed  - quit smoking - Please take prednisone 40 mg x1 day, then 30 mg x1 day, then 20 mg x1 day, then 10 mg x1 day, and then 5 mg x1 day and stop - Take doxycycline 100mg  po twice daily x 5 days; take after meals and avoid sunlight - give urine test for drug screen   #FOllowup  3 months or sooner if needed  Consider impact research trial at follow-up

## 2014-03-15 NOTE — Progress Notes (Signed)
Subjective:    Patient ID: Karla Nelson, female    DOB: 1965/10/18, 49 y.o.   MRN: [id number]  HPI   OV 03/15/2014  Chief Complaint  Patient presents with  . Acute Visit    Pt c/o DOE, hoarseness, prod cough with yellow and green mucus, nose scabs x 4-5 days Pt denies CP/tightness.    Follow-up COPD/asthma, smoking active and motivated to quit, history of previous cocaine use   - This is an acute visit. For the last 4 days she is reporting increased cough, wheezing, chest tightness and runny nose and change in color of sputum to a low. She feels she is having an exacerbation of her obstructive lung disease. She continues to smoke and is unmotivated to quit. Today she gives me a history of remote cocaine use but she is insistent that she does not abuse cocaine anymore. I'm not so sure about sick exposures for this current exacerbation. She is on maintenance Spiriva but also on Dulera and Advair. She got confused with my instructions at last visit. She prefers to just take Advair after I reeducated her    has a past medical history of HTN (hypertension); Cleft palate; NSTEMI (non-ST elevated myocardial infarction) (09-25-2010); DYSPNEA ON EXERTION (03/08/2007); ELECTROCARDIOGRAM, ABNORMAL (02/13/2010); CAD (coronary artery disease); Tobacco abuse disorder; COPD (chronic obstructive pulmonary disease); Hyperlipidemia; and Asthma.   reports that she has been smoking Cigarettes.  She has a 15.5 pack-year smoking history. She has never used smokeless tobacco.  Past Surgical History  Procedure Laterality Date  . Cesarean section    . Cardiac catheterization  Aug 2012    Moderate nonobstructive multivessel CAD with an EF of 70 to 75%  . Coronary angioplasty with stent placement      No Known Allergies  Immunization History  Administered Date(s) Administered  . Influenza Split 11/04/2011  . Influenza Whole 12/10/2010    Family History  Problem Relation Age of Onset  . Diabetes    .  Hypertension    . Asthma Mother   . Asthma Brother   . Heart disease Mother      Current outpatient prescriptions:  .  albuterol (PROVENTIL) (2.5 MG/3ML) 0.083% nebulizer solution, Take 3 mLs (2.5 mg total) by nebulization every 6 (six) hours as needed for wheezing., Disp: 75 mL, Rfl: 12 .  amLODipine (NORVASC) 5 MG tablet, Take 5 mg by mouth daily., Disp: , Rfl:  .  aspirin EC 81 MG EC tablet, Take 1 tablet (81 mg total) by mouth daily., Disp: , Rfl:  .  cloNIDine (CATAPRES) 0.1 MG tablet, Take 0.1 mg by mouth 2 (two) times daily. , Disp: , Rfl:  .  Fluticasone-Salmeterol (ADVAIR DISKUS) 100-50 MCG/DOSE AEPB, Inhale 1 puff into the lungs 2 (two) times daily., Disp: 60 each, Rfl: 6 .  hydrALAZINE (APRESOLINE) 100 MG tablet, Take 1 tablet (100 mg total) by mouth 3 (three) times daily., Disp: 90 tablet, Rfl: 6 .  labetalol (NORMODYNE) 300 MG tablet, Take 1 tablet (300 mg total) by mouth 3 (three) times daily., Disp: 90 tablet, Rfl: 5 .  losartan (COZAAR) 100 MG tablet, Take 1 tablet (100 mg total) by mouth daily., Disp: 30 tablet, Rfl: 5 .  METHADONE HCL PO, Take 38 mg by mouth every morning. , Disp: , Rfl:  .  mometasone-formoterol (DULERA) 100-5 MCG/ACT AERO, Inhale 2 puffs into the lungs 2 (two) times daily., Disp: 1 Inhaler, Rfl: 0 .  Multiple Vitamin (MULTIVITAMIN) tablet, Take 1 tablet by mouth  daily., Disp: , Rfl:  .  nitroGLYCERIN (NITROSTAT) 0.4 MG SL tablet, Place 0.4 mg under the tongue every 5 (five) minutes as needed for chest pain., Disp: , Rfl:  .  Omega-3 Fatty Acids (FISH OIL BURP-LESS PO), Take 1 capsule by mouth daily. , Disp: , Rfl:  .  OXYGEN, Inhale into the lungs. At night, Disp: , Rfl:  .  POTASSIUM CHLORIDE PO, Take by mouth., Disp: , Rfl:  .  PROAIR HFA 108 (90 BASE) MCG/ACT inhaler, USE 2 PUFFS BY MOUTH EVERY 6 HOURS AS NEEDED FOR SHORTNESS OF BREATH, Disp: 8.5 each, Rfl: 2 .  SPIRIVA HANDIHALER 18 MCG inhalation capsule, INHALE 1 CAPSULE VIA HANDIHALER ONCE DAILY AT  THE SAME TIME EVERY DAY, Disp: 30 capsule, Rfl: 5 .  trimethoprim-polymyxin b (POLYTRIM) ophthalmic solution, Place 2 drops into the left eye every 6 (six) hours. X 7 days (Patient taking differently: Place 2 drops into the left eye as needed. X 7 days), Disp: 10 mL, Rfl: 0     Review of Systems  Constitutional: Negative for fever and unexpected weight change.  HENT: Negative for congestion, dental problem, ear pain, nosebleeds, postnasal drip, rhinorrhea, sinus pressure, sneezing, sore throat and trouble swallowing.   Eyes: Negative for redness and itching.  Respiratory: Positive for cough and shortness of breath. Negative for chest tightness and wheezing.   Cardiovascular: Negative for palpitations and leg swelling.  Gastrointestinal: Negative for nausea and vomiting.  Genitourinary: Negative for dysuria.  Musculoskeletal: Negative for joint swelling.  Skin: Negative for rash.  Neurological: Negative for headaches.  Hematological: Does not bruise/bleed easily.  Psychiatric/Behavioral: Negative for dysphoric mood. The patient is not nervous/anxious.     Current outpatient prescriptions:  .  albuterol (PROVENTIL) (2.5 MG/3ML) 0.083% nebulizer solution, Take 3 mLs (2.5 mg total) by nebulization every 6 (six) hours as needed for wheezing., Disp: 75 mL, Rfl: 12 .  amLODipine (NORVASC) 5 MG tablet, Take 5 mg by mouth daily., Disp: , Rfl:  .  aspirin EC 81 MG EC tablet, Take 1 tablet (81 mg total) by mouth daily., Disp: , Rfl:  .  cloNIDine (CATAPRES) 0.1 MG tablet, Take 0.1 mg by mouth 2 (two) times daily. , Disp: , Rfl:  .  Fluticasone-Salmeterol (ADVAIR DISKUS) 100-50 MCG/DOSE AEPB, Inhale 1 puff into the lungs 2 (two) times daily., Disp: 60 each, Rfl: 6 .  hydrALAZINE (APRESOLINE) 100 MG tablet, Take 1 tablet (100 mg total) by mouth 3 (three) times daily., Disp: 90 tablet, Rfl: 6 .  labetalol (NORMODYNE) 300 MG tablet, Take 1 tablet (300 mg total) by mouth 3 (three) times daily., Disp: 90  tablet, Rfl: 5 .  losartan (COZAAR) 100 MG tablet, Take 1 tablet (100 mg total) by mouth daily., Disp: 30 tablet, Rfl: 5 .  METHADONE HCL PO, Take 38 mg by mouth every morning. , Disp: , Rfl:  .  mometasone-formoterol (DULERA) 100-5 MCG/ACT AERO, Inhale 2 puffs into the lungs 2 (two) times daily., Disp: 1 Inhaler, Rfl: 0 .  Multiple Vitamin (MULTIVITAMIN) tablet, Take 1 tablet by mouth daily., Disp: , Rfl:  .  nitroGLYCERIN (NITROSTAT) 0.4 MG SL tablet, Place 0.4 mg under the tongue every 5 (five) minutes as needed for chest pain., Disp: , Rfl:  .  Omega-3 Fatty Acids (FISH OIL BURP-LESS PO), Take 1 capsule by mouth daily. , Disp: , Rfl:  .  OXYGEN, Inhale into the lungs. At night, Disp: , Rfl:  .  POTASSIUM CHLORIDE PO, Take by mouth.,  Disp: , Rfl:  .  PROAIR HFA 108 (90 BASE) MCG/ACT inhaler, USE 2 PUFFS BY MOUTH EVERY 6 HOURS AS NEEDED FOR SHORTNESS OF BREATH, Disp: 8.5 each, Rfl: 2 .  SPIRIVA HANDIHALER 18 MCG inhalation capsule, INHALE 1 CAPSULE VIA HANDIHALER ONCE DAILY AT THE SAME TIME EVERY DAY, Disp: 30 capsule, Rfl: 5 .  trimethoprim-polymyxin b (POLYTRIM) ophthalmic solution, Place 2 drops into the left eye every 6 (six) hours. X 7 days (Patient taking differently: Place 2 drops into the left eye as needed. X 7 days), Disp: 10 mL, Rfl: 0     Objective:   Physical Exam  Filed Vitals:   03/15/14 1425  BP: 202/110  Pulse: 98  Height: 5\' 3"  (1.6 m)  Weight: 182 lb (82.555 kg)  SpO2: 91%         Assessment & Plan:     ICD-9-CM ICD-10-CM   1. COPD exacerbation 491.21 J44.1   2. Smoker 305.1 Z72.0   3. History of cocaine use 305.63 F14.10 Drug Screen, Urine     #ASthma/COPD  - you are in flare up - -continue spiriva  - contnue advair  - use albuterol as needed  - quit smoking - Please take prednisone 40 mg x1 day, then 30 mg x1 day, then 20 mg x1 day, then 10 mg x1 day, and then 5 mg x1 day and stop - Take doxycycline 100mg  po twice daily x 5 days; take after meals  and avoid sunlight - give urine test for drug screen   #FOllowup  3 months or sooner if needed

## 2014-03-18 ENCOUNTER — Telehealth: Payer: Self-pay | Admitting: General Practice

## 2014-03-18 NOTE — Telephone Encounter (Signed)
Pt called after hours and left a voicemail. I returned the patients call and advised her to call back in the morning since she is needing a new patient appt.

## 2014-03-27 ENCOUNTER — Other Ambulatory Visit: Payer: Self-pay | Admitting: Internal Medicine

## 2014-06-08 ENCOUNTER — Other Ambulatory Visit: Payer: Self-pay | Admitting: Internal Medicine

## 2014-08-15 ENCOUNTER — Other Ambulatory Visit: Payer: Self-pay | Admitting: Nephrology

## 2014-08-15 DIAGNOSIS — N184 Chronic kidney disease, stage 4 (severe): Secondary | ICD-10-CM

## 2014-08-19 ENCOUNTER — Other Ambulatory Visit: Payer: Self-pay

## 2014-08-19 DIAGNOSIS — Z1231 Encounter for screening mammogram for malignant neoplasm of breast: Secondary | ICD-10-CM

## 2014-08-20 ENCOUNTER — Ambulatory Visit
Admission: RE | Admit: 2014-08-20 | Discharge: 2014-08-20 | Disposition: A | Discharge status: Home / Self Care | Payer: Medicare Other | Source: Ambulatory Visit | Attending: Nephrology | Admitting: Nephrology

## 2014-08-20 DIAGNOSIS — N184 Chronic kidney disease, stage 4 (severe): Secondary | ICD-10-CM

## 2014-08-26 ENCOUNTER — Other Ambulatory Visit: Payer: Self-pay | Admitting: Internal Medicine

## 2014-08-29 ENCOUNTER — Ambulatory Visit
Admission: RE | Admit: 2014-08-29 | Discharge: 2014-08-29 | Disposition: A | Discharge status: Home / Self Care | Payer: Medicare Other | Source: Ambulatory Visit

## 2014-08-29 DIAGNOSIS — Z1231 Encounter for screening mammogram for malignant neoplasm of breast: Secondary | ICD-10-CM

## 2014-08-31 ENCOUNTER — Emergency Department (HOSPITAL_COMMUNITY): Payer: Medicare Other

## 2014-08-31 ENCOUNTER — Encounter (HOSPITAL_COMMUNITY): Payer: Self-pay | Admitting: Emergency Medicine

## 2014-08-31 ENCOUNTER — Inpatient Hospital Stay (HOSPITAL_COMMUNITY)
Admission: EM | Admit: 2014-08-31 | Discharge: 2014-09-02 | DRG: 065 | Disposition: A | Discharge status: Home Health | Payer: Medicare Other | Attending: Internal Medicine | Admitting: Internal Medicine

## 2014-08-31 DIAGNOSIS — I639 Cerebral infarction, unspecified: Principal | ICD-10-CM | POA: Diagnosis present

## 2014-08-31 DIAGNOSIS — G8929 Other chronic pain: Secondary | ICD-10-CM | POA: Diagnosis present

## 2014-08-31 DIAGNOSIS — I252 Old myocardial infarction: Secondary | ICD-10-CM

## 2014-08-31 DIAGNOSIS — F17210 Nicotine dependence, cigarettes, uncomplicated: Secondary | ICD-10-CM | POA: Diagnosis present

## 2014-08-31 DIAGNOSIS — R531 Weakness: Secondary | ICD-10-CM | POA: Diagnosis not present

## 2014-08-31 DIAGNOSIS — Z6828 Body mass index (BMI) 28.0-28.9, adult: Secondary | ICD-10-CM

## 2014-08-31 DIAGNOSIS — Z79891 Long term (current) use of opiate analgesic: Secondary | ICD-10-CM

## 2014-08-31 DIAGNOSIS — N183 Chronic kidney disease, stage 3 (moderate): Secondary | ICD-10-CM | POA: Diagnosis present

## 2014-08-31 DIAGNOSIS — J4540 Moderate persistent asthma, uncomplicated: Secondary | ICD-10-CM | POA: Diagnosis present

## 2014-08-31 DIAGNOSIS — I2510 Atherosclerotic heart disease of native coronary artery without angina pectoris: Secondary | ICD-10-CM | POA: Diagnosis present

## 2014-08-31 DIAGNOSIS — E669 Obesity, unspecified: Secondary | ICD-10-CM | POA: Diagnosis present

## 2014-08-31 DIAGNOSIS — Z87898 Personal history of other specified conditions: Secondary | ICD-10-CM | POA: Diagnosis present

## 2014-08-31 DIAGNOSIS — J449 Chronic obstructive pulmonary disease, unspecified: Secondary | ICD-10-CM | POA: Diagnosis present

## 2014-08-31 DIAGNOSIS — N179 Acute kidney failure, unspecified: Secondary | ICD-10-CM | POA: Diagnosis present

## 2014-08-31 DIAGNOSIS — Z955 Presence of coronary angioplasty implant and graft: Secondary | ICD-10-CM

## 2014-08-31 DIAGNOSIS — E782 Mixed hyperlipidemia: Secondary | ICD-10-CM | POA: Diagnosis present

## 2014-08-31 DIAGNOSIS — Z7982 Long term (current) use of aspirin: Secondary | ICD-10-CM

## 2014-08-31 DIAGNOSIS — I10 Essential (primary) hypertension: Secondary | ICD-10-CM | POA: Diagnosis present

## 2014-08-31 DIAGNOSIS — G8191 Hemiplegia, unspecified affecting right dominant side: Secondary | ICD-10-CM | POA: Diagnosis present

## 2014-08-31 DIAGNOSIS — I129 Hypertensive chronic kidney disease with stage 1 through stage 4 chronic kidney disease, or unspecified chronic kidney disease: Secondary | ICD-10-CM | POA: Diagnosis present

## 2014-08-31 DIAGNOSIS — R4701 Aphasia: Secondary | ICD-10-CM | POA: Diagnosis present

## 2014-08-31 DIAGNOSIS — Z79899 Other long term (current) drug therapy: Secondary | ICD-10-CM

## 2014-08-31 DIAGNOSIS — F1491 Cocaine use, unspecified, in remission: Secondary | ICD-10-CM | POA: Diagnosis present

## 2014-08-31 LAB — I-STAT CHEM 8, ED
BUN: 32 mg/dL — ABNORMAL HIGH (ref 6–20)
CREATININE: 2.3 mg/dL — AB (ref 0.44–1.00)
Calcium, Ion: 1.08 mmol/L — ABNORMAL LOW (ref 1.12–1.23)
Chloride: 100 mmol/L — ABNORMAL LOW (ref 101–111)
Glucose, Bld: 129 mg/dL — ABNORMAL HIGH (ref 65–99)
HCT: 48 % — ABNORMAL HIGH (ref 36.0–46.0)
HEMOGLOBIN: 16.3 g/dL — AB (ref 12.0–15.0)
POTASSIUM: 3.7 mmol/L (ref 3.5–5.1)
SODIUM: 137 mmol/L (ref 135–145)
TCO2: 24 mmol/L (ref 0–100)

## 2014-08-31 LAB — COMPREHENSIVE METABOLIC PANEL
ALBUMIN: 4 g/dL (ref 3.5–5.0)
ALK PHOS: 106 U/L (ref 38–126)
ALT: 16 U/L (ref 14–54)
AST: 22 U/L (ref 15–41)
Anion gap: 12 (ref 5–15)
BUN: 28 mg/dL — ABNORMAL HIGH (ref 6–20)
CALCIUM: 9.8 mg/dL (ref 8.9–10.3)
CO2: 27 mmol/L (ref 22–32)
Chloride: 98 mmol/L — ABNORMAL LOW (ref 101–111)
Creatinine, Ser: 2.48 mg/dL — ABNORMAL HIGH (ref 0.44–1.00)
GFR calc non Af Amer: 22 mL/min — ABNORMAL LOW (ref >60)
GFR, EST AFRICAN AMERICAN: 25 mL/min — AB (ref >60)
GLUCOSE: 129 mg/dL — AB (ref 65–99)
POTASSIUM: 3.8 mmol/L (ref 3.5–5.1)
SODIUM: 137 mmol/L (ref 135–145)
TOTAL PROTEIN: 8.2 g/dL — AB (ref 6.5–8.1)
Total Bilirubin: 0.7 mg/dL (ref 0.3–1.2)

## 2014-08-31 LAB — DIFFERENTIAL
BASOS ABS: 0.1 10*3/uL (ref 0.0–0.1)
Basophils Relative: 1 % (ref 0–1)
Eosinophils Absolute: 0.2 10*3/uL (ref 0.0–0.7)
Eosinophils Relative: 2 % (ref 0–5)
LYMPHS PCT: 20 % (ref 12–46)
Lymphs Abs: 1.9 10*3/uL (ref 0.7–4.0)
Monocytes Absolute: 0.4 10*3/uL (ref 0.1–1.0)
Monocytes Relative: 5 % (ref 3–12)
NEUTROS PCT: 72 % (ref 43–77)
Neutro Abs: 7 10*3/uL (ref 1.7–7.7)

## 2014-08-31 LAB — CBC
HCT: 45.9 % (ref 36.0–46.0)
HEMOGLOBIN: 15.2 g/dL — AB (ref 12.0–15.0)
MCH: 28.7 pg (ref 26.0–34.0)
MCHC: 33.1 g/dL (ref 30.0–36.0)
MCV: 86.6 fL (ref 78.0–100.0)
Platelets: 286 10*3/uL (ref 150–400)
RBC: 5.3 MIL/uL — ABNORMAL HIGH (ref 3.87–5.11)
RDW: 14.9 % (ref 11.5–15.5)
WBC: 9.5 10*3/uL (ref 4.0–10.5)

## 2014-08-31 LAB — URINALYSIS, ROUTINE W REFLEX MICROSCOPIC
BILIRUBIN URINE: NEGATIVE
GLUCOSE, UA: NEGATIVE mg/dL
HGB URINE DIPSTICK: NEGATIVE
Ketones, ur: NEGATIVE mg/dL
Leukocytes, UA: NEGATIVE
Nitrite: NEGATIVE
Protein, ur: NEGATIVE mg/dL
SPECIFIC GRAVITY, URINE: 1.006 (ref 1.005–1.030)
Urobilinogen, UA: 0.2 mg/dL (ref 0.0–1.0)
pH: 6 (ref 5.0–8.0)

## 2014-08-31 LAB — I-STAT TROPONIN, ED: Troponin i, poc: 0.03 ng/mL (ref 0.00–0.08)

## 2014-08-31 LAB — PROTIME-INR
INR: 0.99 (ref 0.00–1.49)
Prothrombin Time: 13.3 seconds (ref 11.6–15.2)

## 2014-08-31 LAB — APTT: aPTT: 31 seconds (ref 24–37)

## 2014-08-31 MED ORDER — LORAZEPAM 2 MG/ML IJ SOLN
2.0000 mg | Freq: Once | INTRAMUSCULAR | Status: AC
  Administered 2014-08-31: 2 mg via INTRAVENOUS
  Filled 2014-08-31: qty 1

## 2014-08-31 MED ORDER — SODIUM CHLORIDE 0.9 % IV BOLUS (SEPSIS)
1000.0000 mL | Freq: Once | INTRAVENOUS | Status: AC
  Administered 2014-08-31: 1000 mL via INTRAVENOUS

## 2014-08-31 NOTE — ED Notes (Signed)
Called MRI, patient is ready for transport. Spoke to [employer].

## 2014-08-31 NOTE — ED Notes (Signed)
Fluids started for bp management per [facility]

## 2014-08-31 NOTE — ED Notes (Signed)
Karla Nelson, at the bedside to update patient on plan of care. Verbal order for 1L bolus at this time.

## 2014-08-31 NOTE — ED Notes (Signed)
Patient is back in CT.

## 2014-08-31 NOTE — ED Notes (Signed)
Kaitlyn, pa-c, at the bedside.

## 2014-08-31 NOTE — ED Notes (Signed)
Discussed patient case with dr. Dorothyann Gibbs, bp elevated, drift noted in right leg. MD acknowledges, prepare for CT.

## 2014-08-31 NOTE — ED Provider Notes (Signed)
CSN: [id number]     Arrival date & time 08/31/14  1659 History   First MD Initiated Contact with Patient 08/31/14 2054     Chief Complaint  Patient presents with  . Weakness     (Consider location/radiation/quality/duration/timing/severity/associated sxs/prior Treatment) HPI Comments: Karla Nelson, 49 y/o female with HTN, history of cocaine use, tobacco use, NSTEMI and CAD presents with right sided weakness. The weakness started 3 days ago after she had increased stress due to an argument with her mother in law. Since, her right arm has been weak while using the register and he right leg has been weak and dragged while walking. Her foot drag while walking has slightly improved while being in the ED. She denies phantom symptoms, difficulty with speech or understanding, headaches, arrhythmias, and history of stroke.   Patient is a 49 y.o. female presenting with weakness. The history is provided by the patient.  Weakness This is a new problem. The current episode started in the past 7 days. The problem occurs constantly. The problem has been waxing and waning. Associated symptoms include weakness. Pertinent negatives include no chest pain, chills, fever, headaches, myalgias, nausea or neck pain.    Past Medical History  Diagnosis Date  . HTN (hypertension)     Has normal renal arteries.  . Cleft palate   . NSTEMI (non-ST elevated myocardial infarction) 09-25-2010  . DYSPNEA ON EXERTION 03/08/2007  . ELECTROCARDIOGRAM, ABNORMAL 02/13/2010  . CAD (coronary artery disease)     NSTEMI with cath in August 2012 with nonobstructive disease, 50% ostial D1 and 70% distal LCX and PL disease. Normal EF. Has diastolic dysfunction with elevated EDP  . Tobacco abuse disorder   . COPD (chronic obstructive pulmonary disease)   . Hyperlipidemia   . Asthma    Past Surgical History  Procedure Laterality Date  . Cesarean section    . Cardiac catheterization  Aug 2012    Moderate nonobstructive  multivessel CAD with an EF of 70 to 75%  . Coronary angioplasty with stent placement     Family History  Problem Relation Age of Onset  . Diabetes    . Hypertension    . Asthma Mother   . Asthma Brother   . Heart disease Mother    History  Substance Use Topics  . Smoking status: Current Every Day Smoker -- 0.50 packs/day for 31 years    Types: Cigarettes  . Smokeless tobacco: Never Used  . Alcohol Use: No   OB History    Gravida Para Term Preterm AB TAB SAB Ectopic Multiple Living            1     Review of Systems  Constitutional: Negative for fever and chills.  Cardiovascular: Negative for chest pain.  Gastrointestinal: Negative for nausea.  Musculoskeletal: Negative for myalgias and neck pain.  Neurological: Positive for weakness. Negative for headaches.  All other systems reviewed and are negative.     Allergies  Review of patient's allergies indicates no known allergies.  Home Medications   Prior to Admission medications   Medication Sig Start Date End Date Taking? Authorizing Provider  ADVAIR DISKUS 100-50 MCG/DOSE AEPB INHALE 1 PUFF INTO THE LUNGS 2 (TWO) TIMES DAILY. 08/26/14   Brand Males, MD  albuterol (PROVENTIL) (2.5 MG/3ML) 0.083% nebulizer solution Take 3 mLs (2.5 mg total) by nebulization every 6 (six) hours as needed for wheezing. 06/21/11   Sheryle Hail, NP  amLODipine (NORVASC) 10 MG tablet Take 10 mg by mouth  at bedtime. 08/09/14   Historical Provider, MD  amLODipine (NORVASC) 5 MG tablet Take 5 mg by mouth daily.    Historical Provider, MD  aspirin EC 81 MG EC tablet Take 1 tablet (81 mg total) by mouth daily. 06/19/12   Bobby Rumpf York, PA-C  cloNIDine (CATAPRES) 0.1 MG tablet Take 0.1 mg by mouth 2 (two) times daily.  11/11/11   Historical Provider, MD  doxycycline (VIBRA-TABS) 100 MG tablet Take 1 tablet, twice daily for 5 days. Avoid sunlight and take after meals. 03/15/14   Brand Males, MD  hydrALAZINE (APRESOLINE) 100 MG tablet Take 1  tablet (100 mg total) by mouth 3 (three) times daily. 09/04/12   Burtis Junes, NP  labetalol (NORMODYNE) 200 MG tablet Take 200 mg by mouth 2 (two) times daily. 08/09/14   Historical Provider, MD  labetalol (NORMODYNE) 300 MG tablet Take 1 tablet (300 mg total) by mouth 3 (three) times daily. 06/19/12   Bobby Rumpf York, PA-C  losartan (COZAAR) 100 MG tablet Take 1 tablet (100 mg total) by mouth daily. 06/19/12   Bobby Rumpf York, PA-C  losartan-hydrochlorothiazide (HYZAAR) 100-12.5 MG per tablet Take 1 tablet by mouth daily. 08/26/14   Historical Provider, MD  METHADONE HCL PO Take 38 mg by mouth every morning.     Historical Provider, MD  mometasone-formoterol (DULERA) 100-5 MCG/ACT AERO Inhale 2 puffs into the lungs 2 (two) times daily. 07/25/13   Brand Males, MD  Multiple Vitamin (MULTIVITAMIN) tablet Take 1 tablet by mouth daily.    Historical Provider, MD  nitroGLYCERIN (NITROSTAT) 0.4 MG SL tablet Place 0.4 mg under the tongue every 5 (five) minutes as needed for chest pain.    Historical Provider, MD  Omega-3 Fatty Acids (FISH OIL BURP-LESS PO) Take 1 capsule by mouth daily.     Historical Provider, MD  OXYGEN Inhale into the lungs. At night    Historical Provider, MD  POTASSIUM CHLORIDE PO Take by mouth.    Historical Provider, MD  predniSONE (DELTASONE) 10 MG tablet 40 mg x1 day, then 30 mg x1 day, then 20 mg x1 day, then 10 mg x1 day, and then 5 mg x1 day and stop 03/15/14   Brand Males, MD  PROAIR HFA 108 (90 BASE) MCG/ACT inhaler USE 2 PUFFS BY MOUTH EVERY 6 HOURS AS NEEDED FOR SHORTNESS OF BREATH 08/26/14   Brand Males, MD  SPIRIVA HANDIHALER 18 MCG inhalation capsule INHALE 1 CAPSULE VIA HANDIHALER ONCE DAILY AT THE SAME TIME EVERY DAY 06/10/14   Brand Males, MD  trimethoprim-polymyxin b (POLYTRIM) ophthalmic solution Place 2 drops into the left eye every 6 (six) hours. X 7 days Patient taking differently: Place 2 drops into the left eye as needed. X 7 days 02/05/14   Annett Gula H Presson, PA   BP 189/85 mmHg  Pulse 78  Temp(Src) 98.3 F (36.8 C) (Oral)  Resp 12  SpO2 97%  LMP 07/25/2014 Physical Exam  Constitutional: She is oriented to person, place, and time. She appears well-developed and well-nourished. No distress.  HENT:  Head: Normocephalic and atraumatic.  Eyes: Conjunctivae and EOM are normal.  Neck: Normal range of motion.  Cardiovascular: Normal rate and regular rhythm.  Exam reveals no gallop and no friction rub.   No murmur heard. Pulmonary/Chest: Effort normal and breath sounds normal. She has no wheezes. She has no rales. She exhibits no tenderness.  Abdominal: Soft. She exhibits no distension. There is no tenderness. There is no rebound.  Musculoskeletal:  Normal range of motion. She exhibits no edema or tenderness.  Neurological: She is alert and oriented to person, place, and time. She has normal reflexes. She displays normal reflexes. No sensory deficit. She displays a negative Romberg sign. Gait abnormal. Coordination normal.  Right upper and lower extremity with slightly diminished extremities slightly weaker. Able to ambulate with a noted right foot drag. Sensation equal and intact bilaterally.   Skin: Skin is warm and dry. She is not diaphoretic.  Psychiatric: She has a normal mood and affect. Her behavior is normal.  Nursing note and vitals reviewed.   ED Course  Procedures (including critical care time) Labs Review Labs Reviewed  CBC - Abnormal; Notable for the following:    RBC 5.30 (*)    Hemoglobin 15.2 (*)    All other components within normal limits  COMPREHENSIVE METABOLIC PANEL - Abnormal; Notable for the following:    Chloride 98 (*)    Glucose, Bld 129 (*)    BUN 28 (*)    Creatinine, Ser 2.48 (*)    Total Protein 8.2 (*)    GFR calc non Af Amer 22 (*)    GFR calc Af Amer 25 (*)    All other components within normal limits  I-STAT CHEM 8, ED - Abnormal; Notable for the following:    Chloride 100 (*)    BUN 32  (*)    Creatinine, Ser 2.30 (*)    Glucose, Bld 129 (*)    Calcium, Ion 1.08 (*)    Hemoglobin 16.3 (*)    HCT 48.0 (*)    All other components within normal limits  PROTIME-INR  APTT  DIFFERENTIAL  URINALYSIS, ROUTINE W REFLEX MICROSCOPIC (NOT AT [facility])  URINE RAPID DRUG SCREEN, HOSP PERFORMED  I-STAT TROPOININ, ED   CRITICAL CARE Performed by: Alvina Chou   Total critical care time: 35 min  Critical care time was exclusive of separately billable procedures and treating other patients.  Critical care was necessary to treat or prevent imminent or life-threatening deterioration.  Critical care was time spent personally by me on the following activities: development of treatment plan with patient and/or surrogate as well as nursing, discussions with consultants, evaluation of patient's response to treatment, examination of patient, obtaining history from patient or surrogate, ordering and performing treatments and interventions, ordering and review of laboratory studies, ordering and review of radiographic studies, pulse oximetry and re-evaluation of patient's condition.   Imaging Review Ct Head Wo Contrast  08/31/2014   CLINICAL DATA:  49 year old female with right-sided weakness  EXAM: CT HEAD WITHOUT CONTRAST  TECHNIQUE: Contiguous axial images were obtained from the base of the skull through the vertex without intravenous contrast.  COMPARISON:  Facial CT dated 01/16/2007  FINDINGS: The ventricles and sulci are appropriate in size for patient's age. Minimal periventricular and deep white matter hypodensities represent chronic microvascular ischemic changes. There is no intracranial hemorrhage. No mass effect or midline shift identified.  There is diffuse mucoperiosteal thickening and partial opacification of the ethmoid air cells. The mastoid air cells are well aerated. The calvarium is intact.  IMPRESSION: No acute intracranial pathology.  Diffuse mucoperiosteal thickening and  partial opacification of the ethmoid air cells.   Electronically Signed   By: Anner Crete M.D.   On: 08/31/2014 20:58   Mr Brain Wo Contrast  09/01/2014   CLINICAL DATA:  Initial evaluation for acute right-sided weakness for 3 days.  EXAM: MRI HEAD WITHOUT CONTRAST  TECHNIQUE: Multiplanar, multiecho pulse sequences  of the brain and surrounding structures were obtained without intravenous contrast.  COMPARISON:  Prior CT from 09/01/2014  FINDINGS: Mild diffuse prominence of the CSF containing spaces is compatible with generalized cerebral atrophy. Patchy T2/FLAIR hyperintensity within the periventricular and deep white matter both cerebral hemispheres noted, nonspecific, but most likely related to mild chronic small vessel ischemic disease. No definite areas of chronic infarction. Possible small remote lacunar infarct within the left lentiform nucleus.  There is an acute ischemic nonhemorrhagic infarct involving the posterior limb of the left internal capsule measuring 13 x 5 x 13 mm (series 3, image 21). No significant mass effect. No associated hemorrhage. No other areas of infarction. Gray-white matter differentiation maintained. Normal intravascular flow voids preserved. No acute intracranial hemorrhage.  No mass lesion, midline shift, or mass effect. No hydrocephalus. No extra-axial fluid collection.  Craniocervical junction within normal limits. Pituitary gland grossly unremarkable.  No acute abnormality about the orbits. Scattered mucosal thickening present within the maxillary sinuses, sphenoid sinuses, and ethmoidal air cells. No air-fluid level to suggest active sinus infection. No mastoid effusion. Inner ear structures grossly normal.  Bone marrow signal intensity within normal limits. Scalp soft tissues unremarkable.  IMPRESSION: 1. 13 x 5 x 13 mm acute ischemic nonhemorrhagic infarct involving the posterior limb of the left internal capsule. 2. No other acute intracranial process. 3. Mild atrophy  with chronic small vessel ischemic disease.   Electronically Signed   By: Jeannine Boga M.D.   On: 09/01/2014 01:02     EKG Interpretation None      MDM   Final diagnoses:  Right sided weakness  Acute ischemic stroke    1:09 AM] Patient with mild right upper and lower extremity weakness for the past 3 days has a negative CT. I spoke with Dr. Janann Colonel who recommended MRI for further evaluation. MRI shows acute ischemic infarct. Patient will be a medicine admission and Neurology consult. Patient consistently hypertensive here in the ED and will be treated with IV hydralazine.    Alvina Chou, PA-C 09/01/14 0121

## 2014-08-31 NOTE — ED Notes (Signed)
Called mri, explained patient has been medicated, ready for transport. Spoke to [employer].

## 2014-08-31 NOTE — ED Notes (Signed)
Patient anxious during MRI, sent back to room.

## 2014-08-31 NOTE — ED Notes (Signed)
Pt c/o right side weakness onset 3 days ago.  Pt denies any pain. St's feels like she is dragging right leg.  Pt alert and oriented x's 3.

## 2014-08-31 NOTE — ED Notes (Signed)
Patient requests IV fluids, called Kaitlyn to report. She acnkowledges, states she is coming to see patient. No new orders.

## 2014-09-01 ENCOUNTER — Inpatient Hospital Stay (HOSPITAL_COMMUNITY): Payer: Medicare Other

## 2014-09-01 ENCOUNTER — Emergency Department (HOSPITAL_COMMUNITY): Payer: Medicare Other

## 2014-09-01 DIAGNOSIS — E782 Mixed hyperlipidemia: Secondary | ICD-10-CM | POA: Diagnosis not present

## 2014-09-01 DIAGNOSIS — E785 Hyperlipidemia, unspecified: Secondary | ICD-10-CM | POA: Diagnosis not present

## 2014-09-01 DIAGNOSIS — R4701 Aphasia: Secondary | ICD-10-CM | POA: Diagnosis not present

## 2014-09-01 DIAGNOSIS — R531 Weakness: Secondary | ICD-10-CM | POA: Insufficient documentation

## 2014-09-01 DIAGNOSIS — J449 Chronic obstructive pulmonary disease, unspecified: Secondary | ICD-10-CM

## 2014-09-01 DIAGNOSIS — Z7982 Long term (current) use of aspirin: Secondary | ICD-10-CM | POA: Diagnosis not present

## 2014-09-01 DIAGNOSIS — F1910 Other psychoactive substance abuse, uncomplicated: Secondary | ICD-10-CM

## 2014-09-01 DIAGNOSIS — N179 Acute kidney failure, unspecified: Secondary | ICD-10-CM | POA: Diagnosis present

## 2014-09-01 DIAGNOSIS — G8191 Hemiplegia, unspecified affecting right dominant side: Secondary | ICD-10-CM | POA: Diagnosis not present

## 2014-09-01 DIAGNOSIS — E669 Obesity, unspecified: Secondary | ICD-10-CM | POA: Diagnosis not present

## 2014-09-01 DIAGNOSIS — I2510 Atherosclerotic heart disease of native coronary artery without angina pectoris: Secondary | ICD-10-CM

## 2014-09-01 DIAGNOSIS — I129 Hypertensive chronic kidney disease with stage 1 through stage 4 chronic kidney disease, or unspecified chronic kidney disease: Secondary | ICD-10-CM

## 2014-09-01 DIAGNOSIS — I639 Cerebral infarction, unspecified: Secondary | ICD-10-CM

## 2014-09-01 DIAGNOSIS — N189 Chronic kidney disease, unspecified: Secondary | ICD-10-CM | POA: Diagnosis not present

## 2014-09-01 DIAGNOSIS — G8929 Other chronic pain: Secondary | ICD-10-CM

## 2014-09-01 DIAGNOSIS — I6350 Cerebral infarction due to unspecified occlusion or stenosis of unspecified cerebral artery: Secondary | ICD-10-CM | POA: Diagnosis not present

## 2014-09-01 DIAGNOSIS — Z79899 Other long term (current) drug therapy: Secondary | ICD-10-CM | POA: Diagnosis not present

## 2014-09-01 DIAGNOSIS — F17210 Nicotine dependence, cigarettes, uncomplicated: Secondary | ICD-10-CM | POA: Diagnosis not present

## 2014-09-01 DIAGNOSIS — N183 Chronic kidney disease, stage 3 (moderate): Secondary | ICD-10-CM | POA: Diagnosis not present

## 2014-09-01 DIAGNOSIS — I252 Old myocardial infarction: Secondary | ICD-10-CM | POA: Diagnosis not present

## 2014-09-01 DIAGNOSIS — Z79891 Long term (current) use of opiate analgesic: Secondary | ICD-10-CM | POA: Diagnosis not present

## 2014-09-01 DIAGNOSIS — Z6828 Body mass index (BMI) 28.0-28.9, adult: Secondary | ICD-10-CM | POA: Diagnosis not present

## 2014-09-01 DIAGNOSIS — Z955 Presence of coronary angioplasty implant and graft: Secondary | ICD-10-CM | POA: Diagnosis not present

## 2014-09-01 DIAGNOSIS — F1421 Cocaine dependence, in remission: Secondary | ICD-10-CM

## 2014-09-01 DIAGNOSIS — J4540 Moderate persistent asthma, uncomplicated: Secondary | ICD-10-CM | POA: Diagnosis not present

## 2014-09-01 DIAGNOSIS — I10 Essential (primary) hypertension: Secondary | ICD-10-CM | POA: Diagnosis not present

## 2014-09-01 DIAGNOSIS — Z7951 Long term (current) use of inhaled steroids: Secondary | ICD-10-CM

## 2014-09-01 LAB — CBC
HCT: 42.6 % (ref 36.0–46.0)
Hemoglobin: 14.2 g/dL (ref 12.0–15.0)
MCH: 28.2 pg (ref 26.0–34.0)
MCHC: 33.3 g/dL (ref 30.0–36.0)
MCV: 84.7 fL (ref 78.0–100.0)
PLATELETS: 303 10*3/uL (ref 150–400)
RBC: 5.03 MIL/uL (ref 3.87–5.11)
RDW: 14.9 % (ref 11.5–15.5)
WBC: 9.7 10*3/uL (ref 4.0–10.5)

## 2014-09-01 LAB — LIPID PANEL
Cholesterol: 171 mg/dL (ref 0–200)
HDL: 33 mg/dL — ABNORMAL LOW (ref >40)
LDL CALC: 113 mg/dL — AB (ref 0–99)
Total CHOL/HDL Ratio: 5.2 RATIO
Triglycerides: 127 mg/dL (ref <150)
VLDL: 25 mg/dL (ref 0–40)

## 2014-09-01 LAB — GLUCOSE, CAPILLARY
GLUCOSE-CAPILLARY: 116 mg/dL — AB (ref 65–99)
Glucose-Capillary: 147 mg/dL — ABNORMAL HIGH (ref 65–99)
Glucose-Capillary: 146 mg/dL — ABNORMAL HIGH (ref 65–99)

## 2014-09-01 LAB — RAPID URINE DRUG SCREEN, HOSP PERFORMED
AMPHETAMINES: NOT DETECTED
BARBITURATES: NOT DETECTED
Benzodiazepines: NOT DETECTED
Cocaine: NOT DETECTED
Opiates: POSITIVE — AB
TETRAHYDROCANNABINOL: NOT DETECTED

## 2014-09-01 LAB — CREATININE, SERUM
Creatinine, Ser: 2.37 mg/dL — ABNORMAL HIGH (ref 0.44–1.00)
GFR calc non Af Amer: 23 mL/min — ABNORMAL LOW (ref >60)
GFR, EST AFRICAN AMERICAN: 26 mL/min — AB (ref >60)

## 2014-09-01 MED ORDER — NITROGLYCERIN 0.4 MG SL SUBL: 0.4000 mg | SUBLINGUAL_TABLET | SUBLINGUAL | Status: DC | PRN

## 2014-09-01 MED ORDER — HYDRALAZINE HCL 20 MG/ML IJ SOLN
10.0000 mg | INTRAMUSCULAR | Status: AC
  Administered 2014-09-01: 10 mg via INTRAVENOUS
  Filled 2014-09-01: qty 1

## 2014-09-01 MED ORDER — ADULT MULTIVITAMIN W/MINERALS CH
1.0000 | ORAL_TABLET | Freq: Every day | ORAL | Status: DC
  Administered 2014-09-01 – 2014-09-02 (×2): 1 via ORAL
  Filled 2014-09-01 (×2): qty 1

## 2014-09-01 MED ORDER — METHADONE HCL 5 MG PO TABS
44.0000 mg | ORAL_TABLET | Freq: Every day | ORAL | Status: DC
  Administered 2014-09-01 – 2014-09-02 (×2): 45 mg via ORAL
  Filled 2014-09-01 (×2): qty 9

## 2014-09-01 MED ORDER — ALBUTEROL SULFATE (2.5 MG/3ML) 0.083% IN NEBU: 2.5000 mg | INHALATION_SOLUTION | Freq: Four times a day (QID) | RESPIRATORY_TRACT | Status: DC | PRN

## 2014-09-01 MED ORDER — ATORVASTATIN CALCIUM 10 MG PO TABS
20.0000 mg | ORAL_TABLET | Freq: Every day | ORAL | Status: DC
  Administered 2014-09-01: 20 mg via ORAL
  Filled 2014-09-01: qty 2

## 2014-09-01 MED ORDER — PNEUMOCOCCAL VAC POLYVALENT 25 MCG/0.5ML IJ INJ
0.5000 mL | INJECTION | INTRAMUSCULAR | Status: AC
  Administered 2014-09-02: 0.5 mL via INTRAMUSCULAR
  Filled 2014-09-01: qty 0.5

## 2014-09-01 MED ORDER — TIOTROPIUM BROMIDE MONOHYDRATE 18 MCG IN CAPS
18.0000 ug | ORAL_CAPSULE | Freq: Every day | RESPIRATORY_TRACT | Status: DC
  Administered 2014-09-01 – 2014-09-02 (×2): 18 ug via RESPIRATORY_TRACT
  Filled 2014-09-01: qty 5

## 2014-09-01 MED ORDER — MOMETASONE FURO-FORMOTEROL FUM 100-5 MCG/ACT IN AERO
2.0000 | INHALATION_SPRAY | Freq: Two times a day (BID) | RESPIRATORY_TRACT | Status: DC
  Administered 2014-09-01 – 2014-09-02 (×3): 2 via RESPIRATORY_TRACT
  Filled 2014-09-01: qty 8.8

## 2014-09-01 MED ORDER — SENNOSIDES-DOCUSATE SODIUM 8.6-50 MG PO TABS: 1.0000 | ORAL_TABLET | Freq: Every evening | ORAL | Status: DC | PRN

## 2014-09-01 MED ORDER — ASPIRIN 300 MG RE SUPP: 300.0000 mg | Freq: Every day | RECTAL | Status: DC

## 2014-09-01 MED ORDER — ALBUTEROL SULFATE (2.5 MG/3ML) 0.083% IN NEBU: 3.0000 mL | INHALATION_SOLUTION | RESPIRATORY_TRACT | Status: DC | PRN

## 2014-09-01 MED ORDER — ASPIRIN 325 MG PO TABS
325.0000 mg | ORAL_TABLET | Freq: Every day | ORAL | Status: DC
  Administered 2014-09-01 – 2014-09-02 (×2): 325 mg via ORAL
  Filled 2014-09-01 (×2): qty 1

## 2014-09-01 MED ORDER — METOPROLOL TARTRATE 1 MG/ML IV SOLN
5.0000 mg | Freq: Four times a day (QID) | INTRAVENOUS | Status: DC | PRN
  Administered 2014-09-01 – 2014-09-02 (×2): 5 mg via INTRAVENOUS
  Filled 2014-09-01 (×2): qty 5

## 2014-09-01 MED ORDER — HYDRALAZINE HCL 20 MG/ML IJ SOLN
10.0000 mg | Freq: Once | INTRAMUSCULAR | Status: AC
  Administered 2014-09-01: 10 mg via INTRAVENOUS

## 2014-09-01 MED ORDER — ENOXAPARIN SODIUM 40 MG/0.4ML ~~LOC~~ SOLN
40.0000 mg | SUBCUTANEOUS | Status: DC
  Administered 2014-09-01 – 2014-09-02 (×2): 40 mg via SUBCUTANEOUS
  Filled 2014-09-01 (×2): qty 0.4

## 2014-09-01 MED ORDER — STROKE: EARLY STAGES OF RECOVERY BOOK
Freq: Once | Status: AC
  Administered 2014-09-01: 1

## 2014-09-01 NOTE — ED Provider Notes (Signed)
Medical screening examination/treatment/procedure(s) were conducted as a shared visit with non-physician practitioner(s) and myself.  I personally evaluated the patient during the encounter.   EKG Interpretation   Date/Time:  Saturday August 31 2014 19:20:14 EDT Ventricular Rate:  75 PR Interval:  147 QRS Duration: 86 QT Interval:  419 QTC Calculation: 468 R Axis:   44 Text Interpretation:  Sinus rhythm LAE, consider biatrial enlargement  Probable LVH with secondary repol abnrm No significant change since last  tracing Confirmed by Casondra Gasca,  DO, [place] Fosdick [id number]) on 09/01/2014 1:11:07 AM      Pt is a 49 y.o. female who presents to the emergency department with 3 days of right-sided weakness. CT was initially negative. Neurology consult who recommended MRI. MRI shows a acute nonhemorrhagic infarct of the left internal capsule. Admitted to hospitalist. She is hypotensive, will give medication to control her blood pressure but still allow for permissive hypertension.  [place], DO 09/01/14 [id number]

## 2014-09-01 NOTE — Progress Notes (Signed)
  Echocardiogram 2D Echocardiogram has been performed.  Jennette Dubin 09/01/2014, 10:01 AM

## 2014-09-01 NOTE — Progress Notes (Signed)
Chart reviewed. Patient examined. Admitted after midnight. Urine drug screen negative for cocaine. Positive for opiates. Echocardiogram without source of embolus. Good ejection fraction. LDL 113. Needs statin. Physical therapy occupational therapy pending. Neurology following.  Doree Barthel, MD [facility]

## 2014-09-01 NOTE — Progress Notes (Deleted)
STROKE TEAM PROGRESS NOTE   HISTORY Karla Nelson is a 49 y.o. female hx of HTN, NSTEMI, HLD, and cocaine usage presenting with 3 day history of right sided weakness. Notes symptoms started after an argument with her mother in law. Started in her RUE and now also involves RLE. Denies any sensory deficits. No visual or speech deficits.   MRI brain imaging reviewed, shows acute infarct in left posterior limb of internal capsule.   Date last known well: 08/29/2014 Time last known well: unclear tPA Given: no, outside tPA window Modified Rankin: Rankin Score=0   SUBJECTIVE (INTERVAL HISTORY)    OBJECTIVE Temp:  [97.9 F (36.6 C)-98.6 F (37 C)] 98 F (36.7 C) (07/24 1032) Pulse Rate:  [68-94] 82 (07/24 1032) Cardiac Rhythm:  [-] Normal sinus rhythm (07/24 0621) Resp:  [10-21] 18 (07/24 1032) BP: (164-219)/(71-121) 168/71 mmHg (07/24 1032) SpO2:  [90 %-98 %] 94 % (07/24 1032) Weight:  [80.4 kg (177 lb 4 oz)] 80.4 kg (177 lb 4 oz) (07/24 0253)  No results for input(s): GLUCAP in the last 168 hours.  Recent Labs Lab 08/31/14 1846 08/31/14 1854 09/01/14 0740  NA 137 137  --   K 3.8 3.7  --   CL 98* 100*  --   CO2 27  --   --   GLUCOSE 129* 129*  --   BUN 28* 32*  --   CREATININE 2.48* 2.30* 2.37*  CALCIUM 9.8  --   --     Recent Labs Lab 08/31/14 1846  AST 22  ALT 16  ALKPHOS 106  BILITOT 0.7  PROT 8.2*  ALBUMIN 4.0    Recent Labs Lab 08/31/14 1846 08/31/14 1854 09/01/14 0740  WBC 9.5  --  9.7  NEUTROABS 7.0  --   --   HGB 15.2* 16.3* 14.2  HCT 45.9 48.0* 42.6  MCV 86.6  --  84.7  PLT 286  --  303   No results for input(s): CKTOTAL, CKMB, CKMBINDEX, TROPONINI in the last 168 hours.  Recent Labs  08/31/14 1846  LABPROT 13.3  INR 0.99    Recent Labs  08/31/14 2125  COLORURINE YELLOW  LABSPEC 1.006  PHURINE 6.0  GLUCOSEU NEGATIVE  HGBUR NEGATIVE  BILIRUBINUR NEGATIVE  KETONESUR NEGATIVE  PROTEINUR NEGATIVE  UROBILINOGEN 0.2  NITRITE  NEGATIVE  LEUKOCYTESUR NEGATIVE       Component Value Date/Time   CHOL 171 09/01/2014 0740   TRIG 127 09/01/2014 0740   HDL 33* 09/01/2014 0740   CHOLHDL 5.2 09/01/2014 0740   VLDL 25 09/01/2014 0740   LDLCALC 113* 09/01/2014 0740   Lab Results  Component Value Date   HGBA1C 6.8* 09/25/2010      Component Value Date/Time   LABOPIA POSITIVE* 09/01/2014 0333   COCAINSCRNUR NONE DETECTED 09/01/2014 0333   LABBENZ NONE DETECTED 09/01/2014 0333   AMPHETMU NONE DETECTED 09/01/2014 0333   THCU NONE DETECTED 09/01/2014 0333   LABBARB NONE DETECTED 09/01/2014 0333    No results for input(s): ETH in the last 168 hours.   Imaging   Ct Head Wo Contrast 08/31/2014    No acute intracranial pathology.  Diffuse mucoperiosteal thickening and partial opacification of the ethmoid air cells.       Mr Jodene Nam Head Wo Contrast 09/01/2014    1. No large or proximal arterial branch occlusion identified within the intracranial circulation.  2. No hemodynamically significant or correctable stenosis.  3. Hypoplastic/absent right A1 segment, with the anterior cerebral arteries supplied via the  left carotid artery system.  4. Fetal origin of the right PCA.       Mr Brain Wo Contrast 09/01/2014    1. 13 x 5 x 13 mm acute ischemic nonhemorrhagic infarct involving the posterior limb of the left internal capsule.  2. No other acute intracranial process.  3. Mild atrophy with chronic small vessel ischemic disease.        PHYSICAL EXAM       ASSESSMENT/PLAN Karla Nelson is a 49 y.o. female with history of hypertension, coronary artery disease with previous MI, tobacco use, COPD, hyperlipidemia, and asthma presenting with right hemiparesis. She did not receive IV t-PA due to late presentation.   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  Resultant    MRI 13 x 5 x 13 mm acute ischemic nonhemorrhagic infarct involving the posterior limb of the left internal capsule.    MRA  No large or proximal arterial branch occlusion identified within the intracranial circulation.   Carotid Doppler pending  2D Echo pending  LDL 89  HgbA1c pending  Lovenox for VTE prophylaxis Diet Carb Modified Fluid consistency:: Thin; Room service appropriate?: Yes  aspirin 81 mg orally every day prior to admission, now on aspirin 325 mg orally every day  Patient counseled to be compliant with her antithrombotic medications  Ongoing aggressive stroke risk factor management  Therapy recommendations: Pending  Disposition: Pending  Hypertension  Home meds: Amlodipine, labetalol, losartan/hydrochlorothiazide, and hydralazine  Blood pressure mildly high Permissive hypertension <220/120 for 24-48 hours and then gradually normalize within 5-7 days Patient counseled to be compliant with her blood pressure medications  Hyperlipidemia  Home meds:  No lipid lowering medications prior to admission  LDL 89, goal < 70  Add Lipitor 20 mg daily  Continue statin at discharge    Other Stroke Risk Factors  Cigarette smoker, advised to stop smoking  Obesity, Body mass index is 28.62 kg/(m^2).   Coronary artery disease  History of substance abuse   Other Active Problems  Elevated BUN and creatinine (creatinine increased today 2.37 )  Other Pertinent History    Hospital day # 0    Attending note   To contact Stroke Continuity provider, please refer to [email or web address] After hours, contact General Neurology

## 2014-09-01 NOTE — Consult Note (Signed)
Stroke Consult    Chief Complaint: right sided weakness HPI: Karla Nelson is an 49 y.o. female hx of HTN, NSTEMI, HLD, cocaine usage presenting with 3 day history of right sided weakness. Notes symptoms started after an argument with her mother in law. Started in her RUE and now also involves RLE. Denies any sensory deficits. No visual or speech deficits.   MRI brain imaging reviewed, shows acute infarct in left posterior limb of internal capsule.   Date last known well: 08/29/2014 Time last known well: unclear tPA Given: no, outside tPA window Modified Rankin: Rankin Score=0  Past Medical History  Diagnosis Date  . HTN (hypertension)     Has normal renal arteries.  . Cleft palate   . NSTEMI (non-ST elevated myocardial infarction) 09-25-2010  . DYSPNEA ON EXERTION 03/08/2007  . ELECTROCARDIOGRAM, ABNORMAL 02/13/2010  . CAD (coronary artery disease)     NSTEMI with cath in August 2012 with nonobstructive disease, 50% ostial D1 and 70% distal LCX and PL disease. Normal EF. Has diastolic dysfunction with elevated EDP  . Tobacco abuse disorder   . COPD (chronic obstructive pulmonary disease)   . Hyperlipidemia   . Asthma     Past Surgical History  Procedure Laterality Date  . Cesarean section    . Cardiac catheterization  Aug 2012    Moderate nonobstructive multivessel CAD with an EF of 70 to 75%  . Coronary angioplasty with stent placement      Family History  Problem Relation Age of Onset  . Diabetes    . Hypertension    . Asthma Mother   . Asthma Brother   . Heart disease Mother    Social History:  reports that she has been smoking Cigarettes.  She has a 15.5 pack-year smoking history. She has never used smokeless tobacco. She reports that she does not drink alcohol or use illicit drugs.  Allergies: No Known Allergies   (Not in a hospital admission)  ROS: Out of a complete 14 system review, the patient complains of only the following symptoms, and all other reviewed  systems are negative. + weakness  Physical Examination: Filed Vitals:   08/31/14 2209  BP:   Pulse:   Temp: 98.3 F (36.8 C)  Resp:    Physical Exam  Constitutional: He appears well-developed and well-nourished.  Psych: Affect appropriate to situation Eyes: No scleral injection HENT: No OP obstrucion Head: Normocephalic.  Cardiovascular: Normal rate and regular rhythm.  Respiratory: Effort normal and breath sounds normal.  GI: Soft. Bowel sounds are normal. No distension. There is no tenderness.  Skin: WDI  Neurologic Examination: Mental Status: (exam limited as patient just received 2mg  ativan for MRI) Lethargic, requires constant stimulation, oriented to name only. Will follow simple commands Cranial Nerves: II: optic discs not visualized, blinks to threat bilaterally, pupils equal, round, reactive to light  III,IV, VI: ptosis not present, extra-ocular motions intact bilaterally V,VII:flattening of right NLF, facial light touch sensation normal bilaterally VIII: hearing normal bilaterally IX,X: gag reflex present XI: trapezius strength/neck flexion strength normal bilaterally XII: tongue strength normal  Motor: Limited exam due to mental status LUE and LLE 5/5 strength Proximal RUE 4/5, distal 4+/5 RLE proximal 4+/5, distal 5-/5 Tone and bulk:normal tone throughout; no atrophy noted Sensory: Pinprick and light touch intact throughout, bilaterally Deep Tendon Reflexes: 2+ and symmetric throughout Plantars: Right: downgoing   Left: downgoing Cerebellar: Not following commands to test Gait: deferred  Laboratory Studies:   Basic Metabolic Panel:  Recent Labs  Lab 08/31/14 1846 08/31/14 1854  NA 137 137  K 3.8 3.7  CL 98* 100*  CO2 27  --   GLUCOSE 129* 129*  BUN 28* 32*  CREATININE 2.48* 2.30*  CALCIUM 9.8  --     Liver Function Tests:  Recent Labs Lab 08/31/14 1846  AST 22  ALT 16  ALKPHOS 106  BILITOT 0.7  PROT 8.2*  ALBUMIN 4.0   No  results for input(s): LIPASE, AMYLASE in the last 168 hours. No results for input(s): AMMONIA in the last 168 hours.  CBC:  Recent Labs Lab 08/31/14 1846 08/31/14 1854  WBC 9.5  --   NEUTROABS 7.0  --   HGB 15.2* 16.3*  HCT 45.9 48.0*  MCV 86.6  --   PLT 286  --     Cardiac Enzymes: No results for input(s): CKTOTAL, CKMB, CKMBINDEX, TROPONINI in the last 168 hours.  BNP: Invalid input(s): POCBNP  CBG: No results for input(s): GLUCAP in the last 168 hours.  Microbiology: Results for orders placed or performed during the hospital encounter of 06/16/12  Blood culture (routine x 2)     Status: None   Collection Time: 06/16/12 10:20 AM  Result Value Ref Range Status   Specimen Description BLOOD LEFT HAND  Final   Special Requests BOTTLES DRAWN AEROBIC AND ANAEROBIC 10CC  Final   Culture  Setup Time 06/16/2012 18:03  Final   Culture NO GROWTH 5 DAYS  Final   Report Status 06/22/2012 FINAL  Final  Blood culture (routine x 2)     Status: None   Collection Time: 06/16/12 10:28 AM  Result Value Ref Range Status   Specimen Description BLOOD LEFT ARM  Final   Special Requests BOTTLES DRAWN AEROBIC AND ANAEROBIC 10CC  Final   Culture  Setup Time 06/16/2012 17:59  Final   Culture NO GROWTH 5 DAYS  Final   Report Status 06/22/2012 FINAL  Final  Urine culture     Status: None   Collection Time: 06/16/12 11:25 AM  Result Value Ref Range Status   Specimen Description URINE, CLEAN CATCH  Final   Special Requests NONE  Final   Culture  Setup Time 06/16/2012 17:51  Final   Colony Count 50,000 COLONIES/ML  Final   Culture PROTEUS MIRABILIS  Final   Report Status 06/18/2012 FINAL  Final   Organism ID, Bacteria PROTEUS MIRABILIS  Final      Susceptibility   Proteus mirabilis - MIC*    AMPICILLIN <=2 SENSITIVE Sensitive     CEFAZOLIN 8 SENSITIVE Sensitive     CEFTRIAXONE <=1 SENSITIVE Sensitive     CIPROFLOXACIN <=0.25 SENSITIVE Sensitive     GENTAMICIN <=1 SENSITIVE Sensitive      LEVOFLOXACIN <=0.12 SENSITIVE Sensitive     NITROFURANTOIN 128 RESISTANT Resistant     TOBRAMYCIN <=1 SENSITIVE Sensitive     TRIMETH/SULFA <=20 SENSITIVE Sensitive     PIP/TAZO <=4 SENSITIVE Sensitive     * PROTEUS MIRABILIS    Coagulation Studies:  Recent Labs  08/31/14 1846  LABPROT 13.3  INR 0.99    Urinalysis:  Recent Labs Lab 08/31/14 2125  COLORURINE YELLOW  LABSPEC 1.006  PHURINE 6.0  GLUCOSEU NEGATIVE  HGBUR NEGATIVE  BILIRUBINUR NEGATIVE  KETONESUR NEGATIVE  PROTEINUR NEGATIVE  UROBILINOGEN 0.2  NITRITE NEGATIVE  LEUKOCYTESUR NEGATIVE    Lipid Panel:     Component Value Date/Time   CHOL 156 09/04/2012 0832   TRIG 172.0* 09/04/2012 0832   HDL 32.50* 09/04/2012  0832   CHOLHDL 5 09/04/2012 0832   VLDL 34.4 09/04/2012 0832   LDLCALC 89 09/04/2012 0832    HgbA1C:  Lab Results  Component Value Date   HGBA1C 6.8* 09/25/2010    Urine Drug Screen:     Component Value Date/Time   LABOPIA NONE DETECTED 09/25/2010 0816   COCAINSCRNUR NONE DETECTED 09/25/2010 0816   LABBENZ NONE DETECTED 09/25/2010 0816   AMPHETMU NONE DETECTED 09/25/2010 0816   THCU NONE DETECTED 09/25/2010 0816   LABBARB NONE DETECTED 09/25/2010 0816    Alcohol Level: No results for input(s): ETH in the last 168 hours.  Other results: EKG: normal sinus rhythm.  Imaging: Ct Head Wo Contrast  08/31/2014   CLINICAL DATA:  49 year old female with right-sided weakness  EXAM: CT HEAD WITHOUT CONTRAST  TECHNIQUE: Contiguous axial images were obtained from the base of the skull through the vertex without intravenous contrast.  COMPARISON:  Facial CT dated 01/16/2007  FINDINGS: The ventricles and sulci are appropriate in size for patient's age. Minimal periventricular and deep white matter hypodensities represent chronic microvascular ischemic changes. There is no intracranial hemorrhage. No mass effect or midline shift identified.  There is diffuse mucoperiosteal thickening and partial  opacification of the ethmoid air cells. The mastoid air cells are well aerated. The calvarium is intact.  IMPRESSION: No acute intracranial pathology.  Diffuse mucoperiosteal thickening and partial opacification of the ethmoid air cells.   Electronically Signed   By: Anner Crete M.D.   On: 08/31/2014 20:58    Assessment: 49 y.o. female of HTN, HLD, NSTEMI, cocaine use presenting with 3 day history of right sided weakness. MRI imaging reviewed, shows acute infarct in posterior limb of left internal capsule. Suspect small vessel etiology vs possible cocaine induced.   Plan: 1. MRA  of the brain without contrast 3. PT consult, OT consult, Speech consult 4. Echocardiogram 5. Carotid dopplers 6. Prophylactic therapyASA 325mg  7. Risk factor modification 8. Telemetry monitoring 9. Frequent neuro checks 10. NPO until RN stroke swallow screen   Jim Like, DO [facility] [phone]  If 7pm- 7am, please page neurology on call as listed in [facility]. 09/01/2014, 1:02 AM

## 2014-09-01 NOTE — ED Notes (Signed)
Upon recheck, pt BP 219/109.

## 2014-09-01 NOTE — H&P (Signed)
[facility] History and Physical  Karla Nelson [id number] DOB: [birth year] DOA: 08/31/2014  Referring physician: ED physician PCP: Pcp Not In System   Chief Complaint: weakness  HPI:  Karla Nelson is a 49yo woman with PMH of HTN, h/o cocaine use, NSTEMI and CAD, COPD who presented with right sided weakness and difficulty speaking. The symptoms started 3 days ago after she had incraesed stress at home.  She noticed that her right arm was weak and this moved into her leg where she had to drag her leg.  Unfortunately, Karla Nelson received ativan for her MRI and I was able to confirm this story with her, but she was sleepy for the rest of the history.  I also confirmed her PMH with her.  The history was obtained from chart review and discussion with other providers.   Pharmacy was able to perform a med reconciliation, this should be confirmed in the morning.  When I asked confirmatory questions, all answers were positive.   In the ED, an MRI showed posterior internal capsule stroke.   Assessment and Plan:  Acute ischemic stroke - Neurology has seen and will follow - MRI as below, internal capsule stroke - MRA ordered - PT/OT/SLP - TTE and CD ordered - ASA 325 - A1C, fasting lipid ordered - Telemetry - neuro checks overnight - NPO until stroke swallow screening - Patient with elevated BP in the ED to the [id number] systolic, given hydralazine at 130am.  Otherwise, allow permissive HTN given ischemic stroke.   AKI on CKD - Renal function worsened to 2.3, up from 1.5 2 years ago - Unclear if this is chronic - She has grade 2 CHF per TTE in 2014, received 1LNS in the ED - Will recheck bmet in the AM - Further fluids if warranted.   Essential hypertension - BP very elevated - Holding home meds as of now to allow for permissive HTN - Hydralazine as noted above    Mixed hyperlipidemia - Check lipid panel - Does not appear to be on a statin based on home meds - Start  atorvastatin    CAD (coronary artery disease) - Patients meds include amlodipine, baby aspirin, labetalol, hyzaar. She has reported taking them - Restart as indicated 24-48 hours post stroke.  Full dose aspirin started    COPD (chronic obstructive pulmonary disease) - Continue inhalers, albuterol nebs prn  History of cocaine use - Urine tox screen not back, unclear if this played a role in current symptoms  Chronic pain - Continue home dose of methadone.  Confirm dose with methadone clinic when possible.    Radiological Exams on Admission: Ct Head Wo Contrast  08/31/2014   CLINICAL DATA:  49 year old female with right-sided weakness  EXAM: CT HEAD WITHOUT CONTRAST  TECHNIQUE: Contiguous axial images were obtained from the base of the skull through the vertex without intravenous contrast.  COMPARISON:  Facial CT dated 01/16/2007  FINDINGS: The ventricles and sulci are appropriate in size for patient's age. Minimal periventricular and deep white matter hypodensities represent chronic microvascular ischemic changes. There is no intracranial hemorrhage. No mass effect or midline shift identified.  There is diffuse mucoperiosteal thickening and partial opacification of the ethmoid air cells. The mastoid air cells are well aerated. The calvarium is intact.  IMPRESSION: No acute intracranial pathology.  Diffuse mucoperiosteal thickening and partial opacification of the ethmoid air cells.   Electronically Signed   By: Anner Crete M.D.   On: 08/31/2014 20:58   Mr Brain Lottie Dawson  Contrast  09/01/2014   CLINICAL DATA:  Initial evaluation for acute right-sided weakness for 3 days.  EXAM: MRI HEAD WITHOUT CONTRAST  TECHNIQUE: Multiplanar, multiecho pulse sequences of the brain and surrounding structures were obtained without intravenous contrast.  COMPARISON:  Prior CT from 09/01/2014  FINDINGS: Mild diffuse prominence of the CSF containing spaces is compatible with generalized cerebral atrophy. Patchy T2/FLAIR  hyperintensity within the periventricular and deep white matter both cerebral hemispheres noted, nonspecific, but most likely related to mild chronic small vessel ischemic disease. No definite areas of chronic infarction. Possible small remote lacunar infarct within the left lentiform nucleus.  There is an acute ischemic nonhemorrhagic infarct involving the posterior limb of the left internal capsule measuring 13 x 5 x 13 mm (series 3, image 21). No significant mass effect. No associated hemorrhage. No other areas of infarction. Gray-white matter differentiation maintained. Normal intravascular flow voids preserved. No acute intracranial hemorrhage.  No mass lesion, midline shift, or mass effect. No hydrocephalus. No extra-axial fluid collection.  Craniocervical junction within normal limits. Pituitary gland grossly unremarkable.  No acute abnormality about the orbits. Scattered mucosal thickening present within the maxillary sinuses, sphenoid sinuses, and ethmoidal air cells. No air-fluid level to suggest active sinus infection. No mastoid effusion. Inner ear structures grossly normal.  Bone marrow signal intensity within normal limits. Scalp soft tissues unremarkable.  IMPRESSION: 1. 13 x 5 x 13 mm acute ischemic nonhemorrhagic infarct involving the posterior limb of the left internal capsule. 2. No other acute intracranial process. 3. Mild atrophy with chronic small vessel ischemic disease.   Electronically Signed   By: Jeannine Boga M.D.   On: 09/01/2014 01:02     Code Status: Full Family Communication: Pt at bedside Disposition Plan: Admit for further evaluation    Gilles Chiquito, MD [phone]   Review of Systems:  Unable to obtain due to patient being sedated  + for weakness, some speech changes    Past Medical History  Diagnosis Date  . HTN (hypertension)     Has normal renal arteries.  . Cleft palate   . NSTEMI (non-ST elevated myocardial infarction) 09-25-2010  . DYSPNEA ON  EXERTION 03/08/2007  . ELECTROCARDIOGRAM, ABNORMAL 02/13/2010  . CAD (coronary artery disease)     NSTEMI with cath in August 2012 with nonobstructive disease, 50% ostial D1 and 70% distal LCX and PL disease. Normal EF. Has diastolic dysfunction with elevated EDP  . Tobacco abuse disorder   . COPD (chronic obstructive pulmonary disease)   . Hyperlipidemia   . Asthma     Past Surgical History  Procedure Laterality Date  . Cesarean section    . Cardiac catheterization  Aug 2012    Moderate nonobstructive multivessel CAD with an EF of 70 to 75%  . Coronary angioplasty with stent placement      Social History:  reports that she has been smoking Cigarettes.  She has a 15.5 pack-year smoking history. She has never used smokeless tobacco. She reports that she does not drink alcohol or use illicit drugs.  Unable to confirm with patient.  There was some mention of a history of cocaine use, unclear if she used recently.   No Known Allergies  Family History  Problem Relation Age of Onset  . Diabetes    . Hypertension    . Asthma Mother   . Asthma Brother   . Heart disease Mother    Pharmacy review was done, however, unable to confirm. Full med reconciliation should be done  in the AM.   Prior to Admission medications   Medication Sig Start Date End Date Taking? Authorizing Provider  ADVAIR DISKUS 100-50 MCG/DOSE AEPB INHALE 1 PUFF INTO THE LUNGS 2 (TWO) TIMES DAILY. Patient taking differently: INHALE 1 PUFF INTO THE LUNGS DAILY 08/26/14  Yes Brand Males, MD  albuterol (PROVENTIL) (2.5 MG/3ML) 0.083% nebulizer solution Take 3 mLs (2.5 mg total) by nebulization every 6 (six) hours as needed for wheezing. 06/21/11  Yes Sheryle Hail, NP  amLODipine (NORVASC) 10 MG tablet Take 10 mg by mouth at bedtime. 08/09/14  Yes Historical Provider, MD  aspirin EC 81 MG EC tablet Take 1 tablet (81 mg total) by mouth daily. 06/19/12  Yes Bobby Rumpf [place], PA-C  labetalol (NORMODYNE) 200 MG tablet Take 200 mg  by mouth 2 (two) times daily. 08/09/14  Yes Historical Provider, MD  losartan-hydrochlorothiazide (HYZAAR) 100-12.5 MG per tablet Take 1 tablet by mouth daily. 08/26/14  Yes Historical Provider, MD  METHADONE HCL PO Take 44 mg by mouth daily.    Yes Historical Provider, MD  Multiple Vitamin (MULTIVITAMIN WITH MINERALS) TABS tablet Take 1 tablet by mouth daily.   Yes Historical Provider, MD  nitroGLYCERIN (NITROSTAT) 0.4 MG SL tablet Place 0.4 mg under the tongue every 5 (five) minutes as needed for chest pain.   Yes Historical Provider, MD  PROAIR HFA 108 (90 BASE) MCG/ACT inhaler USE 2 PUFFS BY MOUTH EVERY 6 HOURS AS NEEDED FOR SHORTNESS OF BREATH 08/26/14  Yes Brand Males, MD  SPIRIVA HANDIHALER 18 MCG inhalation capsule INHALE 1 CAPSULE VIA HANDIHALER ONCE DAILY AT THE SAME TIME EVERY DAY 06/10/14  Yes Brand Males, MD  hydrALAZINE (APRESOLINE) 100 MG tablet Take 1 tablet (100 mg total) by mouth 3 (three) times daily. Patient not taking: Reported on 08/31/2014 09/04/12   Burtis Junes, NP  labetalol (NORMODYNE) 300 MG tablet Take 1 tablet (300 mg total) by mouth 3 (three) times daily. Patient not taking: Reported on 08/31/2014 06/19/12   Melton Alar, PA-C  losartan (COZAAR) 100 MG tablet Take 1 tablet (100 mg total) by mouth daily. Patient not taking: Reported on 08/31/2014 06/19/12   Melton Alar, PA-C  mometasone-formoterol (DULERA) 100-5 MCG/ACT AERO Inhale 2 puffs into the lungs 2 (two) times daily. Patient not taking: Reported on 08/31/2014 07/25/13   Brand Males, MD  OXYGEN Inhale into the lungs. At night    Historical Provider, MD    Physical Exam: Filed Vitals:   08/31/14 2230 09/01/14 0115 09/01/14 0138 09/01/14 0149  BP: 213/110 219/109 219/109 195/110  Pulse: 68   69  Temp:      TempSrc:      Resp: 18   12  SpO2: 93%   97%    Physical Exam  Constitutional: Well developed, well nourished.  Opened eyes to voice, but very quickly fell back asleep.  HENT:  Normocephalic.  Eyes: Conjunctivae  normal. PERRLA, no scleral icterus.  Neck: Normal ROM. Neck supple.  CVS: RR, NR, S1/S2 +, no murmurs Pulmonary: Effort and breath sounds normal, no wheezing Abdominal: Soft. BS +,  no distension, tenderness Musculoskeletal:  No edema and no tenderness. She had linear wounds to left upper arm, possibly due to IV attempts Neuro: Alert. Able to do strength testing.  4/5 in grip on the right, 4+/5 in other strength tests on the right.  Speech was normal. + pronator drift on right. Skin: Skin is warm and dry.  Psychiatric: Sleeping.   Labs on Admission:  Basic Metabolic Panel:  Recent Labs Lab 08/31/14 1846 08/31/14 1854  NA 137 137  K 3.8 3.7  CL 98* 100*  CO2 27  --   GLUCOSE 129* 129*  BUN 28* 32*  CREATININE 2.48* 2.30*  CALCIUM 9.8  --    Liver Function Tests:  Recent Labs Lab 08/31/14 1846  AST 22  ALT 16  ALKPHOS 106  BILITOT 0.7  PROT 8.2*  ALBUMIN 4.0   CBC:  Recent Labs Lab 08/31/14 1846 08/31/14 1854  WBC 9.5  --   NEUTROABS 7.0  --   HGB 15.2* 16.3*  HCT 45.9 48.0*  MCV 86.6  --   PLT 286  --    Troponin X 1 0.03  EKG: Normal sinus rhythm, TWI in lateral leads, LAE.  Appears unchanged from previous in May   If 7PM-7AM, please contact night-coverage [email or web address] Password [email or web address] 09/01/2014, 1:51 AM

## 2014-09-01 NOTE — Progress Notes (Signed)
*  PRELIMINARY RESULTS* Vascular Ultrasound Carotid Duplex (Doppler) has been completed.  Preliminary findings: Bilateral:  1-39% ICA stenosis.  Vertebral artery flow is antegrade.      Landry Mellow, RDMS, RVT  09/01/2014, 3:28 PM

## 2014-09-01 NOTE — ED Notes (Signed)
Called receiving nurse, Amado Coe, to report bp after 1st hydralazine, no 2nd dose given. She acknowledges, planning for transfer.

## 2014-09-01 NOTE — Progress Notes (Signed)
Was very hard to get a true NIH due to the patient receiving Ativan for scheduled MRI.  Patient is falling asleep during exam.

## 2014-09-01 NOTE — ED Notes (Signed)
Receiving RN Amado Coe, advises bp reading of 180/100 prior to patient transfer. Kaitlyn, PA-C, allows repeat dose of 10mg  of hydralizine.

## 2014-09-01 NOTE — ED Notes (Signed)
Cannot conduct repeat neuro check as patient is still sedated from ativan. Discussed need for hydralazine to fiance at the bedside, he acknowledges.

## 2014-09-02 DIAGNOSIS — I639 Cerebral infarction, unspecified: Secondary | ICD-10-CM | POA: Diagnosis not present

## 2014-09-02 DIAGNOSIS — I10 Essential (primary) hypertension: Secondary | ICD-10-CM

## 2014-09-02 LAB — GLUCOSE, CAPILLARY
GLUCOSE-CAPILLARY: 121 mg/dL — AB (ref 65–99)
Glucose-Capillary: 100 mg/dL — ABNORMAL HIGH (ref 65–99)

## 2014-09-02 LAB — HEMOGLOBIN A1C
Hgb A1c MFr Bld: 6.9 % — ABNORMAL HIGH (ref 4.8–5.6)
Mean Plasma Glucose: 151 mg/dL

## 2014-09-02 MED ORDER — ATORVASTATIN CALCIUM 20 MG PO TABS: 20.0000 mg | ORAL_TABLET | Freq: Every day | ORAL | Status: AC

## 2014-09-02 MED ORDER — ASPIRIN 325 MG PO TABS: 325.0000 mg | ORAL_TABLET | Freq: Every day | ORAL | Status: AC

## 2014-09-02 MED ORDER — AMLODIPINE BESYLATE 5 MG PO TABS
5.0000 mg | ORAL_TABLET | Freq: Every day | ORAL | Status: DC
  Administered 2014-09-02: 5 mg via ORAL
  Filled 2014-09-02: qty 1

## 2014-09-02 MED ORDER — LABETALOL HCL 200 MG PO TABS
200.0000 mg | ORAL_TABLET | Freq: Two times a day (BID) | ORAL | Status: DC
  Administered 2014-09-02: 200 mg via ORAL
  Filled 2014-09-02: qty 1

## 2014-09-02 NOTE — Care Management Note (Signed)
Case Management Note  Patient Details  Name: Karla Nelson MRN: [id number] Date of Birth: 05-05-1965  Subjective/Objective:                    Action/Plan: Met with patient to discuss home health needs. Patient is agreeable to home health and has chosen [facility], from which she already receives DME. Miranda with [facility] was notified and has accepted the referral for discharge home later today.  [facility] was notified of need for equipment prior to discharge.  Address and phone number were verified and are correct in the chart.  Patient DOES have a PCP, Dr Nolene Ebbs.  Expected Discharge Date:                  Expected Discharge Plan:  [place]  In-House Referral:     Discharge planning Services  CM Consult  Post Acute Care Choice:  Durable Medical Equipment, Home Health Choice offered to:  Patient  DME Arranged:  [facility] D[facility] Agency:  [facility] Arranged:  RN, PT, OT [facility] Agency:  [facility]  Status of Service:  Completed, signed off  Medicare Important Message Given:    Date Medicare IM Given:    Medicare IM give by:    Date Additional Medicare IM Given:    Additional Medicare Important Message give by:     If discussed at [place] of Stay Meetings, dates discussed:    Additional Comments:  Rolm Baptise, RN 09/02/2014, 3:38 PM

## 2014-09-02 NOTE — Progress Notes (Signed)
Discharge orders received, pt for discharge home today with home health PT per [place], IV and telemetry D/C,.  D/C instructions and Rx given with verbalized understanding.  Family at bedside to assist pt with discharge. Staff brought pt downstairs via wheelchair.

## 2014-09-02 NOTE — Evaluation (Signed)
Occupational Therapy Evaluation Patient Details Name: Karla Nelson MRN: [id number] DOB: January 11, 1966 Today's Date: 09/02/2014    History of Present Illness 49 y.o. female with R side weakneess; PMH: HTN, NSTEMI, HLD and cocaine usage. MRI shows acute infarct in L posterior internal capsule   Clinical Impression   Pt with decreased sensation in R UE and LE; pt has full R UE ROM but requires additional time for task completion due to feeling "heavy". Pt denies vision changes. Pt would benefit from acute OT for increased safety and independence with ADLs prior to d/c home with [facility].     Follow Up Recommendations  Home health OT;Supervision/Assistance - 24 hour    Equipment Recommendations  3 in 1 bedside comode    Recommendations for Other Services       Precautions / Restrictions Precautions Precautions: Fall Restrictions Weight Bearing Restrictions: No      Mobility Bed Mobility Overal bed mobility: Independent                Transfers Overall transfer level: Needs assistance Equipment used: Rolling walker (2 wheeled) Transfers: Sit to/from Stand Sit to Stand: Supervision         General transfer comment: VCs for hand placement and safety with use of RW    Balance Overall balance assessment: Needs assistance Sitting-balance support: No upper extremity supported;Feet supported Sitting balance-Leahy Scale: Fair     Standing balance support: Bilateral upper extremity supported                                ADL Overall ADL's : Needs assistance/impaired Eating/Feeding: Set up;Sitting   Grooming: Wash/dry hands;Min guard;Standing   Upper Body Bathing: Minimal assitance;Sitting   Lower Body Bathing: Minimal assistance;Sit to/from stand   Upper Body Dressing : Supervision/safety;Sitting   Lower Body Dressing: Supervision/safety;Sit to/from stand   Toilet Transfer: Supervision/safety;Ambulation;Regular Sport and exercise psychologist and Hygiene: Modified independent       Functional mobility during ADLs: Min guard;Rolling walker       Vision Vision Assessment?: No apparent visual deficits   Perception     Praxis      Pertinent Vitals/Pain Pain Assessment: No/denies pain     Hand Dominance Right   Extremity/Trunk Assessment Upper Extremity Assessment Upper Extremity Assessment: Generalized weakness;RUE deficits/detail RUE Deficits / Details: 4/5 MMT; increased time to initiate movement; decreased grip; reported "heavy" feeling with decreased sensation RUE Sensation: decreased light touch RUE Coordination: decreased fine motor;decreased gross motor   Lower Extremity Assessment Lower Extremity Assessment: Defer to PT evaluation;RLE deficits/detail RLE Deficits / Details: Pt reports R LE feels heavy with decreased sensation (weakness noted 3+/5 gross motions, 3/5 dorsiflexion) RLE Sensation: decreased light touch RLE Coordination: decreased fine motor;decreased gross motor       Communication Communication Communication: No difficulties   Cognition Arousal/Alertness: Awake/alert Behavior During Therapy: WFL for tasks assessed/performed Overall Cognitive Status: Within Functional Limits for tasks assessed                     General Comments       Exercises       Shoulder Instructions      Home Living Family/patient expects to be discharged to:: Private residence Living Arrangements: Spouse/significant other Available Help at Discharge: Family;Available PRN/intermittently Type of Home: House Home Access: Stairs to enter [employer] of Steps: 3 Entrance Stairs-Rails: Right Home Layout: One level  Bathroom Shower/Tub: Risk analyst characteristics: Curtain       Home Equipment: Grab bars - tub/shower;Hand held shower head          Prior Functioning/Environment Level of Independence: Independent        Comments: was still  driving  and taking care of herself even post CVA prior to coming to hosptial (3 days)    OT Diagnosis: Generalized weakness   OT Problem List: Decreased strength;Decreased activity tolerance;Impaired balance (sitting and/or standing);Decreased knowledge of use of DME or AE;Impaired sensation;Impaired UE functional use   OT Treatment/Interventions: Self-care/ADL training;Therapeutic exercise;DME and/or AE instruction;Therapeutic activities;Patient/family education;Balance training    OT Goals(Current goals can be found in the care plan section) Acute Rehab OT Goals Patient Stated Goal: to get better OT Goal Formulation: With patient Time For Goal Achievement: 09/16/14 Potential to Achieve Goals: Good ADL Goals Pt Will Perform Grooming: with supervision;standing Pt Will Perform Upper Body Bathing: with modified independence;sitting Pt Will Perform Lower Body Bathing: with modified independence;sit to/from stand Pt Will Perform Tub/Shower Transfer: Tub transfer;with min guard assist;ambulating;grab bars;rolling walker  OT Frequency: Min 3X/week   Barriers to D/C:            Co-evaluation              End of Session Equipment Utilized During Treatment: Gait belt;Rolling walker Nurse Communication: Mobility status;Precautions  Activity Tolerance: Patient tolerated treatment well Patient left: in bed;with call bell/phone within reach;with bed alarm set;with family/visitor present   Time: [id number] OT Time Calculation (min): 28 min Charges:  OT General Charges $OT Visit: 1 Procedure OT Evaluation $Initial OT Evaluation Tier I: 1 Procedure OT Treatments $Self Care/Home Management : 8-22 mins G-Codes:    Forest Gleason 09/02/2014, 10:48 AM

## 2014-09-02 NOTE — Discharge Summary (Signed)
Physician Discharge Summary  Karla Nelson [id number] DOB: 12-05-1965 DOA: 08/31/2014  PCP: Karla Nelson  Admit date: 08/31/2014 Discharge date: 09/02/2014  Time spent: greater than 30 minutes  Recommendations for Outpatient Follow-up:   Monitor LFTs on statin  Home PT and OT arranged.  Monitor creatinine  Monitor hypertension  Discharge Diagnoses:  Principal Problem:   Acute ischemic stroke Active Problems:   Essential hypertension   Mixed hyperlipidemia   CAD (coronary artery disease)   COPD (chronic obstructive pulmonary disease)   Asthma, moderate persistent   History of cocaine use  tobacco abuse, counseled against. Chronic kidney disease stage 3  Discharge Condition: stable  Diet recommendation: heart healthy  Filed Weights   09/01/14 0253  Weight: 80.4 kg (177 lb 4 oz)    History of present illness:  49yo woman with PMH of HTN, h/o cocaine use, NSTEMI and CAD, COPD who presented with right sided weakness and difficulty speaking. The symptoms started 3 days ago after she had incraesed stress at home. She noticed that her right arm was weak and this moved into her leg where she had to drag her leg. Unfortunately, Karla Nelson received ativan for her MRI and I was able to confirm this story with her, but she was sleepy for the rest of the history. I also confirmed her PMH with her. The history was obtained from chart review and discussion with other providers. 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  MRA No large or proximal arterial branch occlusion identified within the intracranial circulation.   Carotid Doppler Bilateral: 1-39% ICA stenosis. Vertebral artery flow is  antegrade.  2D Echo EF 60-65%. No cardiac source of emboli identified.  LDL 89  HgbA1c 6.9  Neurology recommends aspirin 325 mg a day and statin.  Home PT and OT arranged  Hypertension  Permissive hypertension during the hospitalization. May resume amlodipine and labetalol. Home Medication will lists losartan hydrochlorothiazide. I have recommended she stop losartan hydrochlorothiazide due to progressive renal failure. Creatinine a year ago was 1.5. Currently 2.4. Renal ultrasound was done a few weeks ago showed right renal atrophy. Suspect progression of chronic kidney disease  Hyperlipidemia  Home meds: No lipid lowering medications prior to admission  LDL 89, goal < 70  Add Lipitor 20 mg daily Continue statin at discharg  Chronic kidney disease: See above  Procedures:  None  Consultations:  Neurology  Discharge Exam: Filed Vitals:   09/02/14 1351  BP: 154/101  Pulse: 90  Temp: 97.7 F (36.5 C)  Resp: 20    General: Alert, oriented. Cardiovascular: Regular rate rhythm without murmurs gallops rubs Respiratory: Clear to auscultation bilaterally without wheezes rhonchi or rales Neurologic: Cranial nerves intact. 4-5 strength right arm and leg  Discharge Instructions   Discharge Instructions    Ambulatory referral to Neurology    Complete by:  As directed   Karla Nelson requests follow up for this patient in 2 months.     Diet - low sodium heart healthy    Complete by:  As directed      Walk with assistance    Complete by:  As directed      Walker     Complete by:  As directed  Current Discharge Medication List    START taking these medications   Details  aspirin 325 MG tablet Take 1 tablet (325 mg total) by mouth daily.    atorvastatin (LIPITOR) 20 MG tablet Take 1 tablet (20 mg total) by mouth daily at 6 PM. Qty: 30 tablet, Refills: 1      CONTINUE these medications which have NOT CHANGED   Details  ADVAIR DISKUS 100-50 MCG/DOSE  AEPB INHALE 1 PUFF INTO THE LUNGS 2 (TWO) TIMES DAILY. Qty: 60 each, Refills: 0    albuterol (PROVENTIL) (2.5 MG/3ML) 0.083% nebulizer solution Take 3 mLs (2.5 mg total) by nebulization every 6 (six) hours as needed for wheezing. Qty: 75 mL, Refills: 12    amLODipine (NORVASC) 10 MG tablet Take 10 mg by mouth at bedtime. Refills: 12    labetalol (NORMODYNE) 200 MG tablet Take 200 mg by mouth 2 (two) times daily. Refills: 12    METHADONE HCL PO Take 44 mg by mouth daily.     Multiple Vitamin (MULTIVITAMIN WITH MINERALS) TABS tablet Take 1 tablet by mouth daily.    nitroGLYCERIN (NITROSTAT) 0.4 MG SL tablet Place 0.4 mg under the tongue every 5 (five) minutes as needed for chest pain.    PROAIR HFA 108 (90 BASE) MCG/ACT inhaler USE 2 PUFFS BY MOUTH EVERY 6 HOURS AS NEEDED FOR SHORTNESS OF BREATH Qty: 8.5 Inhaler, Refills: 0    SPIRIVA HANDIHALER 18 MCG inhalation capsule INHALE 1 CAPSULE VIA HANDIHALER ONCE DAILY AT THE SAME TIME EVERY DAY Qty: 30 capsule, Refills: 3    hydrALAZINE (APRESOLINE) 100 MG tablet Take 1 tablet (100 mg total) by mouth 3 (three) times daily. Qty: 90 tablet, Refills: 6    OXYGEN Inhale into the lungs. At night      STOP taking these medications     aspirin EC 81 MG EC tablet      losartan-hydrochlorothiazide (HYZAAR) 100-12.5 MG per tablet      losartan (COZAAR) 100 MG tablet      mometasone-formoterol (DULERA) 100-5 MCG/ACT AERO        No Known Allergies    The results of significant diagnostics from this hospitalization (including imaging, microbiology, ancillary and laboratory) are listed below for reference.    Significant Diagnostic Studies: Dg Chest 2 View  09/01/2014   CLINICAL DATA:  Acute cerebral infarction with right-sided weakness.  EXAM: CHEST - 2 VIEW  COMPARISON:  07/25/2013  FINDINGS: The heart size and mediastinal contours are within normal limits. There is no evidence of pulmonary edema, consolidation, pneumothorax, nodule or  pleural fluid. The visualized skeletal structures are unremarkable.  IMPRESSION: No active disease.   Electronically Signed   By: Aletta Nelson M.D.   On: 09/01/2014 10:18   Ct Head Wo Contrast  08/31/2014   CLINICAL DATA:  49 year old female with right-sided weakness  EXAM: CT HEAD WITHOUT CONTRAST  TECHNIQUE: Contiguous axial images were obtained from the base of the skull through the vertex without intravenous contrast.  COMPARISON:  Facial CT dated 01/16/2007  FINDINGS: The ventricles and sulci are appropriate in size for patient's age. Minimal periventricular and deep white matter hypodensities represent chronic microvascular ischemic changes. There is no intracranial hemorrhage. No mass effect or midline shift identified.  There is diffuse mucoperiosteal thickening and partial opacification of the ethmoid air cells. The mastoid air cells are well aerated. The calvarium is intact.  IMPRESSION: No acute intracranial pathology.  Diffuse mucoperiosteal thickening and partial opacification of the ethmoid air cells.  Electronically Signed   By: Anner Crete M.D.   On: 08/31/2014 20:58   Mr Jodene Nam Head Wo Contrast  09/01/2014   CLINICAL DATA:  Initial evaluation for acute stroke.  EXAM: MRA HEAD WITHOUT CONTRAST  TECHNIQUE: Angiographic images of the Circle of Willis were obtained using MRA technique without intravenous contrast.  COMPARISON:  Prior MRI performed earlier on the same day.  FINDINGS: ANTERIOR CIRCULATION:  Study is degraded by motion artifact.  Visualized distal cervical segments of the internal carotid arteries are patent with antegrade flow. The petrous, cavernous, and supra clinoid segments are widely patent. Left A1 segment widely patent. Right A1 segment hypoplastic or absent. Anterior communicating artery normal. Anterior cerebral arteries well opacified.  M1 segments widely patent without stenosis or occlusion. MCA bifurcations grossly normal. Distal MCA branches fairly symmetric and  well opacified bilaterally.  POSTERIOR CIRCULATION:  Both the vertebral arteries are widely patent to the vertebrobasilar junction. Right vertebral artery dominant. Right posterior inferior cerebral artery patent. Left posterior inferior cerebral artery not visualized. Basilar artery widely patent. Superior cerebellar arteries patent proximally. There is fetal origin of the right PCA with widely patent right posterior communicating artery. Left P1 and P2 segments widely patent.  No aneurysm or vascular malformation.  IMPRESSION: 1. No large or proximal arterial branch occlusion identified within the intracranial circulation. 2. No hemodynamically significant or correctable stenosis. 3. Hypoplastic/absent right A1 segment, with the anterior cerebral arteries supplied via the left carotid artery system. 4. Fetal origin of the right PCA.   Electronically Signed   By: Jeannine Boga M.D.   On: 09/01/2014 02:19   Mr Brain Wo Contrast  09/01/2014   CLINICAL DATA:  Initial evaluation for acute right-sided weakness for 3 days.  EXAM: MRI HEAD WITHOUT CONTRAST  TECHNIQUE: Multiplanar, multiecho pulse sequences of the brain and surrounding structures were obtained without intravenous contrast.  COMPARISON:  Prior CT from 09/01/2014  FINDINGS: Mild diffuse prominence of the CSF containing spaces is compatible with generalized cerebral atrophy. Patchy T2/FLAIR hyperintensity within the periventricular and deep white matter both cerebral hemispheres noted, nonspecific, but most likely related to mild chronic small vessel ischemic disease. No definite areas of chronic infarction. Possible small remote lacunar infarct within the left lentiform nucleus.  There is an acute ischemic nonhemorrhagic infarct involving the posterior limb of the left internal capsule measuring 13 x 5 x 13 mm (series 3, image 21). No significant mass effect. No associated hemorrhage. No other areas of infarction. Gray-white matter differentiation  maintained. Normal intravascular flow voids preserved. No acute intracranial hemorrhage.  No mass lesion, midline shift, or mass effect. No hydrocephalus. No extra-axial fluid collection.  Craniocervical junction within normal limits. Pituitary gland grossly unremarkable.  No acute abnormality about the orbits. Scattered mucosal thickening present within the maxillary sinuses, sphenoid sinuses, and ethmoidal air cells. No air-fluid level to suggest active sinus infection. No mastoid effusion. Inner ear structures grossly normal.  Bone marrow signal intensity within normal limits. Scalp soft tissues unremarkable.  IMPRESSION: 1. 13 x 5 x 13 mm acute ischemic nonhemorrhagic infarct involving the posterior limb of the left internal capsule. 2. No other acute intracranial process. 3. Mild atrophy with chronic small vessel ischemic disease.   Electronically Signed   By: Jeannine Boga M.D.   On: 09/01/2014 01:02   US Renal  08/20/2014   CLINICAL DATA:  Chronic renal disease.  EXAM: RENAL / URINARY TRACT ULTRASOUND COMPLETE  COMPARISON:  None.  FINDINGS: Right Kidney:  Length:  9.4 cm. Cortical thinning. Echogenicity within normal limits. No mass or hydronephrosis visualized.  Left Kidney:  Length: 11.8 cm. Echogenicity within normal limits. No mass or hydronephrosis visualized.  Bladder:  Appears normal for degree of bladder distention.  IMPRESSION: 1. Mild right renal atrophy.  2.  No acute abnormality.  No hydronephrosis or bladder distention.   Electronically Signed   By: Marcello Moores  Register   On: 08/20/2014 13:07   Mm Digital Screening Bilateral  08/29/2014   CLINICAL DATA:  Screening.  EXAM: DIGITAL SCREENING BILATERAL MAMMOGRAM WITH CAD  COMPARISON:  Previous exam(s).  ACR Breast Density Category b: There are scattered areas of fibroglandular density.  FINDINGS: There are no findings suspicious for malignancy. Images were processed with CAD.  IMPRESSION: No mammographic evidence of malignancy. A result  letter of this screening mammogram will be mailed directly to the patient.  RECOMMENDATION: Screening mammogram in one year. (Code:SM-B-01Y)  BI-RADS CATEGORY  1: Negative.   Electronically Signed   By: Andres Shad   On: 08/29/2014 15:09    Microbiology: No results found for this or any previous visit (from the past 240 hour(s)).   Labs: Basic Metabolic Panel:  Recent Labs Lab 08/31/14 1846 08/31/14 1854 09/01/14 0740  NA 137 137  --   K 3.8 3.7  --   CL 98* 100*  --   CO2 27  --   --   GLUCOSE 129* 129*  --   BUN 28* 32*  --   CREATININE 2.48* 2.30* 2.37*  CALCIUM 9.8  --   --    Liver Function Tests:  Recent Labs Lab 08/31/14 1846  AST 22  ALT 16  ALKPHOS 106  BILITOT 0.7  PROT 8.2*  ALBUMIN 4.0   No results for input(s): LIPASE, AMYLASE in the last 168 hours. No results for input(s): AMMONIA in the last 168 hours. CBC:  Recent Labs Lab 08/31/14 1846 08/31/14 1854 09/01/14 0740  WBC 9.5  --  9.7  NEUTROABS 7.0  --   --   HGB 15.2* 16.3* 14.2  HCT 45.9 48.0* 42.6  MCV 86.6  --  84.7  PLT 286  --  303   Cardiac Enzymes: No results for input(s): CKTOTAL, CKMB, CKMBINDEX, TROPONINI in the last 168 hours. BNP: BNP (last 3 results) No results for input(s): BNP in the last 8760 hours.  ProBNP (last 3 results) No results for input(s): PROBNP in the last 8760 hours.  CBG:  Recent Labs Lab 09/01/14 1146 09/01/14 1612 09/01/14 2109 09/02/14 0742 09/02/14 1112  GLUCAP 147* 116* 146* 121* 100*       Signed:  Katerina Zurn L  [facility] 09/02/2014, 2:50 PM

## 2014-09-02 NOTE — Care Management Note (Signed)
Case Management Note  Patient Details  Name: Karla Nelson MRN: [id number] Date of Birth: 12-13-1965  Subjective/Objective:                    Action/Plan:   Expected Discharge Date:                  Expected Discharge Plan:     In-House Referral:     Discharge planning Services     Post Acute Care Choice:    Choice offered to:     DME Arranged:    DME Agency:     HH Arranged:    [place] Agency:     Status of Service:     Medicare Important Message Given:    Date Medicare IM Given:    Medicare IM give by:    Date Additional Medicare IM Given:    Additional Medicare Important Message give by:     If discussed at [place] of Stay Meetings, dates discussed:    Additional Comments:  Delrae Sawyers, RN 09/02/2014, 10:03 AM

## 2014-09-02 NOTE — Progress Notes (Addendum)
STROKE TEAM PROGRESS NOTE   HISTORY Karla Nelson is a 49 y.o. female hx of HTN, NSTEMI, HLD, and cocaine usage presenting with 3 day history of right sided weakness. Notes symptoms started after an argument with her mother in law. Started in her RUE and now also involves RLE. Denies any sensory deficits. No visual or speech deficits.   MRI brain imaging reviewed, shows acute infarct in left posterior limb of internal capsule.   Date last known well: 08/29/2014 Time last known well: unclear tPA Given: no, outside tPA window Modified Rankin: Rankin Score=0   SUBJECTIVE (INTERVAL HISTORY) The patient's fianc was at the bedside. Dr. Leonie Man discussed risk factors for strokes. He strongly recommended that the patient and her fianc quit smoking.  OBJECTIVE Temp:  [98.2 F (36.8 C)-98.4 F (36.9 C)] 98.3 F (36.8 C) (07/25 0944) Pulse Rate:  [66-92] 79 (07/25 0944) Cardiac Rhythm:  [-] Normal sinus rhythm (07/25 0805) Resp:  [16-20] 20 (07/25 0944) BP: (154-221)/(71-118) 179/98 mmHg (07/25 0944) SpO2:  [94 %-99 %] 99 % (07/25 0944)   Recent Labs Lab 09/01/14 1146 09/01/14 1612 09/01/14 2109 09/02/14 0742 09/02/14 1112  GLUCAP 147* 116* 146* 121* 100*    Recent Labs Lab 08/31/14 1846 08/31/14 1854 09/01/14 0740  NA 137 137  --   K 3.8 3.7  --   CL 98* 100*  --   CO2 27  --   --   GLUCOSE 129* 129*  --   BUN 28* 32*  --   CREATININE 2.48* 2.30* 2.37*  CALCIUM 9.8  --   --     Recent Labs Lab 08/31/14 1846  AST 22  ALT 16  ALKPHOS 106  BILITOT 0.7  PROT 8.2*  ALBUMIN 4.0    Recent Labs Lab 08/31/14 1846 08/31/14 1854 09/01/14 0740  WBC 9.5  --  9.7  NEUTROABS 7.0  --   --   HGB 15.2* 16.3* 14.2  HCT 45.9 48.0* 42.6  MCV 86.6  --  84.7  PLT 286  --  303   No results for input(s): CKTOTAL, CKMB, CKMBINDEX, TROPONINI in the last 168 hours.  Recent Labs  08/31/14 1846  LABPROT 13.3  INR 0.99    Recent Labs  08/31/14 2125  COLORURINE YELLOW   LABSPEC 1.006  PHURINE 6.0  GLUCOSEU NEGATIVE  HGBUR NEGATIVE  BILIRUBINUR NEGATIVE  KETONESUR NEGATIVE  PROTEINUR NEGATIVE  UROBILINOGEN 0.2  NITRITE NEGATIVE  LEUKOCYTESUR NEGATIVE       Component Value Date/Time   CHOL 171 09/01/2014 0740   TRIG 127 09/01/2014 0740   HDL 33* 09/01/2014 0740   CHOLHDL 5.2 09/01/2014 0740   VLDL 25 09/01/2014 0740   LDLCALC 113* 09/01/2014 0740   Lab Results  Component Value Date   HGBA1C 6.9* 09/01/2014      Component Value Date/Time   LABOPIA POSITIVE* 09/01/2014 0333   COCAINSCRNUR NONE DETECTED 09/01/2014 0333   LABBENZ NONE DETECTED 09/01/2014 0333   AMPHETMU NONE DETECTED 09/01/2014 0333   THCU NONE DETECTED 09/01/2014 0333   LABBARB NONE DETECTED 09/01/2014 0333    No results for input(s): ETH in the last 168 hours.   Imaging  Ct Head Wo Contrast 08/31/2014    No acute intracranial pathology.  Diffuse mucoperiosteal thickening and partial opacification of the ethmoid air cells.     Mr Karla Nelson Head Wo Contrast 09/01/2014    1. No large or proximal arterial branch occlusion identified within the intracranial circulation.  2. No hemodynamically significant  or correctable stenosis.  3. Hypoplastic/absent right A1 segment, with the anterior cerebral arteries supplied via the left carotid artery system.  4. Fetal origin of the right PCA.       Mr Brain Wo Contrast 09/01/2014    1. 13 x 5 x 13 mm acute ischemic nonhemorrhagic infarct involving the posterior limb of the left internal capsule.  2. No other acute intracranial process.  3. Mild atrophy with chronic small vessel ischemic disease.       Two view chest x-ray 09/01/2014 No active disease   PHYSICAL EXAM Obese middle-aged African-American lady currently not in distress. . Afebrile. Head is nontraumatic. Neck is supple without bruit.    Cardiac exam no murmur or gallop. Lungs are clear to auscultation. Distal pulses are well felt.  Neurological Exam :  Awake  alert oriented 3 with normal speech and language function. No aphasia, dysarthria or apraxia. Extraocular movements are full range without nystagmus. Fundi were not visualized. Vision acuity is normal. Visual fields are full to bedside confrontational testing. Face is symmetric without weakness. Tongue is midline. Motor system exam revealed no upper or lower eczema to drift but mild weakness of the right grip, intrinsic hand muscles and right hip flexors. Orbits left over right upper extremity. Touch and pinprick sensation are equal bilaterally. Plantars are downgoing. Deep tendon reflexes are 1+ symmetric. Gait was not tested.   ASSESSMENT/PLAN [name] is a 49 y.o. female with history of hypertension, coronary artery disease with previous MI, tobacco use, COPD, hyperlipidemia, and asthma presenting with right hemiparesis. She did not receive IV t-PA due to late presentation.   Stroke: Dominant brain subcortical infarct secondary to small vessel disease.  Resultant  mild right hemiparesis  MRI 13 x 5 x 13 mm acute ischemic nonhemorrhagic infarct involving the posterior limb of the left internal capsule.   MRA  No large or proximal arterial branch occlusion identified within the intracranial circulation.   Carotid Doppler Bilateral: 1-39% ICA stenosis. Vertebral artery flow is antegrade.  2D Echo EF 60-65%. No cardiac source of emboli identified.  LDL 89  HgbA1c 6.9  Lovenox for VTE prophylaxis Diet Carb Modified Fluid consistency:: Thin; Room service appropriate?: Yes  aspirin 81 mg orally every day prior to admission, now on aspirin 325 mg orally every day  Patient counseled to be compliant with her antithrombotic medications  Ongoing aggressive stroke risk factor management  Therapy recommendations: Home health PT and OT recommended.  Disposition: Pending  Hypertension  Home meds: Amlodipine, labetalol, losartan/hydrochlorothiazide, and hydralazine  Blood  pressure mildly high Permissive hypertension <220/120 for 24-48 hours and then gradually normalize within 5-7 days Patient counseled to be compliant with her blood pressure medications  Hyperlipidemia  Home meds:  No lipid lowering medications prior to admission  LDL 89, goal < 70  Add Lipitor 20 mg daily  Continue statin at discharge    Other Stroke Risk Factors  Cigarette smoker, advised to stop smoking  Obesity, Body mass index is 28.62 kg/(m^2).   Coronary artery disease  History of substance abuse   Other Active Problems  Elevated BUN and creatinine (creatinine increased 2.37 ) possible chronic kidney disease    PLAN  The stroke team will sign off at this time. Please call if we can be of further service.  Follow-up with Dr. Leonie Man in 2 months.  Continue aspirin and statin    Lowry Ram [facility] Pager [phone] 09/02/2014, 1:10 PM I have personally examined  this patient, reviewed notes, independently viewed imaging studies, participated in medical decision making and plan of care. I have made any additions or clarifications directly to the above note. Agree with note above. She presented with subacute right-sided weakness which seems to be improving. She has a left brain subcortical lacunar infarct secondary to small vessel disease with cocaine-related vasculopathy as well. I had a long discussion with the patient and her fiancee at the bedside and counseled him to quit smoking as well as cocaine. She remains at risk for neurological worsening, recurrent stroke, TIAs and needs aggressive risk factor modification. Continue aspirin and statin. Follow-up as an outpatient in 2 months  Antony Contras, [place] Pager: [phone] 09/02/2014 3:18 PM        To contact Stroke Continuity provider, please refer to [email or web address] After hours, contact General Neurology

## 2014-09-02 NOTE — Care Management Note (Signed)
Case Management Note  Patient Details  Name: Jadrian Santa MRN: [id number] Date of Birth: [birth year]  Subjective/Objective: 49 y.o. F admitted with Acute Ischemic Infarct L Posterior limb Internal Capsule. Pt resided at in private residence with spouse pta. PT eval pending.  OT= HHOT, 3:1, BSC.                   Action/Plan: Continue RSW. Will follow for further PT recommendations.    Expected Discharge Date:                  Expected Discharge Plan:     In-House Referral:     Discharge planning Services     Post Acute Care Choice:    Choice offered to:     DME Arranged:    DME Agency:     HH Arranged:    [place] Agency:     Status of Service:     Medicare Important Message Given:    Date Medicare IM Given:    Medicare IM give by:    Date Additional Medicare IM Given:    Additional Medicare Important Message give by:     If discussed at [place] of Stay Meetings, dates discussed:    Additional Comments:  Delrae Sawyers, RN 09/02/2014, 9:58 AM

## 2014-09-02 NOTE — Evaluation (Signed)
Speech Language Pathology Evaluation Patient Details Name: Karla Nelson MRN: [id number] DOB: 03-Dec-1965 Today's Date: 09/02/2014 Time:  -     Problem List:  Patient Active Problem List   Diagnosis Date Noted  . Acute ischemic stroke 09/01/2014  . Stroke 09/01/2014  . Right sided weakness   . Smoker 03/15/2014  . History of cocaine use 03/15/2014  . Asthma, moderate persistent 07/26/2013  . 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 Acute sinusitis 04/11/2013  . NSTEMI (non-ST elevated myocardial infarction) 06/19/2012  . Acute-on-chronic kidney injury 06/19/2012  . Community acquired pneumonia 06/16/2012  . COPD exacerbation 12/05/2011  . COPD (chronic obstructive pulmonary disease) 07/12/2011  . CAD (coronary artery disease) 01/13/2011  . Tobacco abuse 01/13/2011  . Mixed hyperlipidemia 10/13/2010  . ELECTROCARDIOGRAM, ABNORMAL 02/13/2010  . CLEFT PALATE 02/12/2010  . Essential hypertension 03/08/2007  . DYSPNEA ON EXERTION 03/08/2007   Past Medical History:  Past Medical History  Diagnosis Date  . HTN (hypertension)     Has normal renal arteries.  . Cleft palate   . NSTEMI (non-ST elevated myocardial infarction) 09-25-2010  . DYSPNEA ON EXERTION 03/08/2007  . ELECTROCARDIOGRAM, ABNORMAL 02/13/2010  . CAD (coronary artery disease)     NSTEMI with cath in August 2012 with nonobstructive disease, 50% ostial D1 and 70% distal LCX and PL disease. Normal EF. Has diastolic dysfunction with elevated EDP  . Tobacco abuse disorder   . COPD (chronic obstructive pulmonary disease)   . Hyperlipidemia   . Asthma    Past Surgical History:  Past Surgical History  Procedure Laterality Date  . Cesarean section    . Cardiac catheterization  Aug 2012    Moderate nonobstructive multivessel CAD with an EF of 70 to 75%  . Coronary angioplasty with stent placement     HPI:  Karla Nelson is a 49 y.o. female hx of HTN, NSTEMI, HLD, and cocaine usage presenting with 3 day history of right  sided weakness. Notes symptoms started after an argument with her mother in law. Started in her RUE and now also involves RLE. Denies any sensory deficits. No visual or speech deficits. MRI brain shows acute infarct in left posterior limb of internal capsule     Assessment / Plan / Recommendation Clinical Impression  Pt demonstrates normal cognitive linguistic function. No SLP f/u needed, will sign off.     SLP Assessment  Patient does not need any further Speech Lanaguage Pathology Services    Follow Up Recommendations  None    Frequency and Duration        Pertinent Vitals/Pain  none    SLP Goals     SLP Evaluation Prior Functioning  Cognitive/Linguistic Baseline: Within functional limits   Cognition  Overall Cognitive Status: Within Functional Limits for tasks assessed    Comprehension  Auditory Comprehension Overall Auditory Comprehension: Appears within functional limits for tasks assessed    Expression Verbal Expression Overall Verbal Expression: Appears within functional limits for tasks assessed   Oral / Motor Oral Motor/Sensory Function Overall Oral Motor/Sensory Function: Appears within functional limits for tasks assessed Velum:  (palatal fistula, denture ) Motor Speech Overall Motor Speech: Appears within functional limits for tasks assessed   GO     Oswin Johal, Katherene Ponto 09/02/2014, 2:25 PM

## 2014-09-02 NOTE — Progress Notes (Signed)
STROKE TEAM PROGRESS NOTE   HISTORY Karla Nelson is a 49 y.o. female hx of HTN, NSTEMI, HLD, and cocaine usage presenting with 3 day history of right sided weakness. Notes symptoms started after an argument with her mother in law. Started in her RUE and now also involves RLE. Denies any sensory deficits. No visual or speech deficits.   MRI brain imaging reviewed, shows acute infarct in left posterior limb of internal capsule.   Date last known well: 08/29/2014 Time last known well: unclear tPA Given: no, outside tPA window Modified Rankin: Rankin Score=0   SUBJECTIVE (INTERVAL HISTORY) Patient unable to provide interval history.  RN reports no event overnight  OBJECTIVE Temp:  [98 F (36.7 C)-98.4 F (36.9 C)] 98.2 F (36.8 C) (07/25 0603) Pulse Rate:  [66-93] 73 (07/25 0603) Cardiac Rhythm:  [-] Normal sinus rhythm (07/24 2000) Resp:  [14-20] 20 (07/25 0603) BP: (154-221)/(71-118) 178/110 mmHg (07/25 0758) SpO2:  [92 %-98 %] 98 % (07/25 0200)   Recent Labs Lab 09/01/14 1146 09/01/14 1612 09/01/14 2109 09/02/14 0742  GLUCAP 147* 116* 146* 121*    Recent Labs Lab 08/31/14 1846 08/31/14 1854 09/01/14 0740  NA 137 137  --   K 3.8 3.7  --   CL 98* 100*  --   CO2 27  --   --   GLUCOSE 129* 129*  --   BUN 28* 32*  --   CREATININE 2.48* 2.30* 2.37*  CALCIUM 9.8  --   --     Recent Labs Lab 08/31/14 1846  AST 22  ALT 16  ALKPHOS 106  BILITOT 0.7  PROT 8.2*  ALBUMIN 4.0    Recent Labs Lab 08/31/14 1846 08/31/14 1854 09/01/14 0740  WBC 9.5  --  9.7  NEUTROABS 7.0  --   --   HGB 15.2* 16.3* 14.2  HCT 45.9 48.0* 42.6  MCV 86.6  --  84.7  PLT 286  --  303   No results for input(s): CKTOTAL, CKMB, CKMBINDEX, TROPONINI in the last 168 hours.  Recent Labs  08/31/14 1846  LABPROT 13.3  INR 0.99    Recent Labs  08/31/14 2125  COLORURINE YELLOW  LABSPEC 1.006  PHURINE 6.0  GLUCOSEU NEGATIVE  HGBUR NEGATIVE  BILIRUBINUR NEGATIVE  KETONESUR  NEGATIVE  PROTEINUR NEGATIVE  UROBILINOGEN 0.2  NITRITE NEGATIVE  LEUKOCYTESUR NEGATIVE       Component Value Date/Time   CHOL 171 09/01/2014 0740   TRIG 127 09/01/2014 0740   HDL 33* 09/01/2014 0740   CHOLHDL 5.2 09/01/2014 0740   VLDL 25 09/01/2014 0740   LDLCALC 113* 09/01/2014 0740   Lab Results  Component Value Date   HGBA1C 6.8* 09/25/2010      Component Value Date/Time   LABOPIA POSITIVE* 09/01/2014 0333   COCAINSCRNUR NONE DETECTED 09/01/2014 0333   LABBENZ NONE DETECTED 09/01/2014 0333   AMPHETMU NONE DETECTED 09/01/2014 0333   THCU NONE DETECTED 09/01/2014 0333   LABBARB NONE DETECTED 09/01/2014 0333    No results for input(s): ETH in the last 168 hours.   Imaging  Ct Head Wo Contrast 08/31/2014    No acute intracranial pathology.  Diffuse mucoperiosteal thickening and partial opacification of the ethmoid air cells.     Mr Jodene Nam Head Wo Contrast 09/01/2014    1. No large or proximal arterial branch occlusion identified within the intracranial circulation.  2. No hemodynamically significant or correctable stenosis.  3. Hypoplastic/absent right A1 segment, with the anterior cerebral arteries supplied via  the left carotid artery system.  4. Fetal origin of the right PCA.       Mr Brain Wo Contrast 09/01/2014    1. 13 x 5 x 13 mm acute ischemic nonhemorrhagic infarct involving the posterior limb of the left internal capsule.  2. No other acute intracranial process.  3. Mild atrophy with chronic small vessel ischemic disease.      PHYSICAL EXAM  General Exam: Pertinent positives: Slight crackles otherwise exam unchanged  Aphasic and unable to answer orientation question Right side hemiparesis Responds to noxious stimuli throughout Gait defered   ASSESSMENT/PLAN [name] is a 49 y.o. female with history of hypertension, coronary artery disease with previous MI, tobacco use, COPD, hyperlipidemia, and asthma presenting with right hemiparesis.  She did not receive IV t-PA due to late presentation.   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  Resultant    MRI 13 x 5 x 13 mm acute ischemic nonhemorrhagic infarct involving the posterior limb of the left internal capsule.   MRA  No large or proximal arterial branch occlusion identified within the intracranial circulation.   Carotid Doppler pending  2D Echo pending  LDL 89  HgbA1c pending  Lovenox for VTE prophylaxis Diet Carb Modified Fluid consistency:: Thin; Room service appropriate?: Yes  aspirin 81 mg orally every day prior to admission, now on aspirin 325 mg orally every day  Patient counseled to be compliant with her antithrombotic medications  Ongoing aggressive stroke risk factor management  Therapy recommendations: Pending  Disposition: Pending  Hypertension  Home meds: Amlodipine, labetalol, losartan/hydrochlorothiazide, and hydralazine  Blood pressure mildly high Permissive hypertension <220/120 for 24-48 hours and then gradually normalize within 5-7 days Patient counseled to be compliant with her blood pressure medications  Hyperlipidemia  Home meds:  No lipid lowering medications prior to admission  LDL 89, goal < 70  Add Lipitor 20 mg daily  Continue statin at discharge    Other Stroke Risk Factors  Cigarette smoker, advised to stop smoking  Obesity, Body mass index is 28.62 kg/(m^2).   Coronary artery disease  History of substance abuse   Other Active Problems  Elevated BUN and creatinine (creatinine increased today 2.37 )  Other Pertinent History  ATTENDING NOTE: Patient was seen and examined by me personally. Documentation accurately reflects findings. The laboratory and radiographic studies reviewed by me. ROS completed; pertinent positives could not be fully documented due to LOC Assessment and plan completed by me personally and fully documented above. Plans include:   Stroke work up:  HgA1C, Lipids,  ECHO, telemetry; PT/OT/ST, carotid U/S Antiplatelet therapy initiated Also, tox screen ordered  Patient medically stable. SIGNED BY: Dr. Elissa Hefty       To contact Stroke Continuity provider, please refer to [email or web address] After hours, contact General Neurology

## 2014-09-02 NOTE — Evaluation (Signed)
Physical Therapy Evaluation Patient Details Name: Sherria Calogero MRN: [id number] DOB: Mar 24, 1965 Today's Date: 09/02/2014   History of Present Illness  49 y.o. female with R side weakneess; PMH: HTN, NSTEMI, HLD and cocaine usage. MRI shows acute infarct in L posterior internal capsule  Clinical Impression  Patient demonstrates deficits in functional mobility as indicated below. Will benefit from continued skilled PT to address deficits and maximize function. Will see as indicated and progress as tolerated. At this time, anticipate patient will be safe for d/c home but recommend initial 24hr supervision and HHPT.    Follow Up Recommendations Home health PT;Supervision/Assistance - 24 hour (initially)    Equipment Recommendations  Rolling walker with 5" wheels    Recommendations for Other Services       Precautions / Restrictions Precautions Precautions: Fall Restrictions Weight Bearing Restrictions: No      Mobility  Bed Mobility Overal bed mobility: Independent                Transfers Overall transfer level: Needs assistance Equipment used: Rolling walker (2 wheeled) Transfers: Sit to/from Stand Sit to Stand: Supervision         General transfer comment: VCs for hand placement and safety with use of RW  Ambulation/Gait Ambulation/Gait assistance: Supervision Ambulation Distance (Feet): 60 Feet Assistive device: Rolling walker (2 wheeled) Gait Pattern/deviations: Step-through pattern;Decreased stride length;Decreased dorsiflexion - right;Trunk flexed;Narrow base of support Gait velocity: decreased Gait velocity interpretation: Below normal speed for age/gender General Gait Details: RLE with poor clearance and LE lag during ambulation. Able to maintain stability with use of device  Stairs            Wheelchair Mobility    Modified Rankin (Stroke Patients Only) Modified Rankin (Stroke Patients Only) Pre-Morbid Rankin Score: No symptoms Modified  Rankin: Moderately severe disability     Balance Overall balance assessment: Needs assistance Sitting-balance support: No upper extremity supported;Feet supported Sitting balance-Leahy Scale: Fair     Standing balance support: Bilateral upper extremity supported                                 Pertinent Vitals/Pain Pain Assessment: No/denies pain    Home Living Family/patient expects to be discharged to:: Private residence Living Arrangements: Spouse/significant other Available Help at Discharge: Family;Available PRN/intermittently Type of Home: House Home Access: Stairs to enter Entrance Stairs-Rails: Right Entrance Stairs-Number of Steps: 3 Home Layout: One level Home Equipment: Grab bars - tub/shower;Hand held shower head      Prior Function Level of Independence: Independent         Comments: was still driving  and taking care of herself even post CVA prior to coming to hosptial (3 days)     Hand Dominance   Dominant Hand: Right    Extremity/Trunk Assessment   Upper Extremity Assessment: Generalized weakness;RUE deficits/detail RUE Deficits / Details: 4/5 MMT; increased time to initiate movement; decreased grip; reported "heavy" feeling with decreased sensation   RUE Sensation: decreased light touch     Lower Extremity Assessment: Defer to PT evaluation;RLE deficits/detail RLE Deficits / Details: Pt reports R LE feels heavy with decreased sensation (weakness noted 3+/5 gross motions, 3/5 dorsiflexion)       Communication   Communication: No difficulties  Cognition Arousal/Alertness: Awake/alert Behavior During Therapy: WFL for tasks assessed/performed Overall Cognitive Status: Within Functional Limits for tasks assessed  General Comments General comments (skin integrity, edema, etc.): elevated BP limiting overall session but patient mobilizing well    Exercises        Assessment/Plan    PT Assessment  Patient needs continued PT services  PT Diagnosis Difficulty walking;Abnormality of gait   PT Problem List Decreased strength;Decreased activity tolerance;Decreased balance;Decreased mobility;Decreased coordination;Decreased safety awareness  PT Treatment Interventions DME instruction;Gait training;Stair training;Functional mobility training;Therapeutic activities;Therapeutic exercise;Balance training;Neuromuscular re-education;Patient/family education   PT Goals (Current goals can be found in the Care Plan section) Acute Rehab PT Goals Patient Stated Goal: to get better PT Goal Formulation: With patient/family Time For Goal Achievement: 09/16/14 Potential to Achieve Goals: Good    Frequency Min 4X/week   Barriers to discharge Decreased caregiver support      Co-evaluation               End of Session Equipment Utilized During Treatment: Gait belt Activity Tolerance: Patient tolerated treatment well;Treatment limited secondary to medical complications (Comment) (elevated BP) Patient left: in bed;with call bell/phone within reach;with bed alarm set;with family/visitor present Nurse Communication: Mobility status         Time: [id number] PT Time Calculation (min) (ACUTE ONLY): 17 min   Charges:   PT Evaluation $Initial PT Evaluation Tier I: 1 Procedure     PT G CodesDuncan Dull 2014-09-05, 10:13 AM Alben Deeds, PT DPT  [phone]

## 2014-09-03 ENCOUNTER — Other Ambulatory Visit: Payer: Self-pay | Admitting: Nurse Practitioner

## 2014-09-03 ENCOUNTER — Other Ambulatory Visit: Payer: Self-pay

## 2014-09-04 ENCOUNTER — Emergency Department (HOSPITAL_COMMUNITY): Payer: Medicare Other

## 2014-09-04 ENCOUNTER — Emergency Department (HOSPITAL_COMMUNITY)
Admission: EM | Admit: 2014-09-04 | Discharge: 2014-09-04 | Disposition: A | Discharge status: Home / Self Care | Payer: Medicare Other | Attending: Emergency Medicine | Admitting: Emergency Medicine

## 2014-09-04 ENCOUNTER — Other Ambulatory Visit: Payer: Self-pay | Admitting: Nurse Practitioner

## 2014-09-04 ENCOUNTER — Encounter (HOSPITAL_COMMUNITY): Payer: Self-pay

## 2014-09-04 DIAGNOSIS — R471 Dysarthria and anarthria: Secondary | ICD-10-CM | POA: Diagnosis not present

## 2014-09-04 DIAGNOSIS — I69351 Hemiplegia and hemiparesis following cerebral infarction affecting right dominant side: Secondary | ICD-10-CM | POA: Diagnosis not present

## 2014-09-04 DIAGNOSIS — E785 Hyperlipidemia, unspecified: Secondary | ICD-10-CM | POA: Diagnosis not present

## 2014-09-04 DIAGNOSIS — I2510 Atherosclerotic heart disease of native coronary artery without angina pectoris: Secondary | ICD-10-CM | POA: Insufficient documentation

## 2014-09-04 DIAGNOSIS — I252 Old myocardial infarction: Secondary | ICD-10-CM | POA: Diagnosis not present

## 2014-09-04 DIAGNOSIS — F1110 Opioid abuse, uncomplicated: Secondary | ICD-10-CM | POA: Diagnosis not present

## 2014-09-04 DIAGNOSIS — R531 Weakness: Secondary | ICD-10-CM | POA: Diagnosis present

## 2014-09-04 DIAGNOSIS — Z720 Tobacco use: Secondary | ICD-10-CM | POA: Diagnosis not present

## 2014-09-04 DIAGNOSIS — I10 Essential (primary) hypertension: Secondary | ICD-10-CM | POA: Diagnosis not present

## 2014-09-04 DIAGNOSIS — M6289 Other specified disorders of muscle: Secondary | ICD-10-CM

## 2014-09-04 DIAGNOSIS — Z7982 Long term (current) use of aspirin: Secondary | ICD-10-CM | POA: Insufficient documentation

## 2014-09-04 DIAGNOSIS — Z79899 Other long term (current) drug therapy: Secondary | ICD-10-CM | POA: Insufficient documentation

## 2014-09-04 DIAGNOSIS — J449 Chronic obstructive pulmonary disease, unspecified: Secondary | ICD-10-CM | POA: Diagnosis not present

## 2014-09-04 LAB — DIFFERENTIAL
Basophils Absolute: 0.1 10*3/uL (ref 0.0–0.1)
Basophils Relative: 1 % (ref 0–1)
EOS ABS: 0.3 10*3/uL (ref 0.0–0.7)
Eosinophils Relative: 2 % (ref 0–5)
Lymphocytes Relative: 15 % (ref 12–46)
Lymphs Abs: 1.8 10*3/uL (ref 0.7–4.0)
MONO ABS: 0.7 10*3/uL (ref 0.1–1.0)
Monocytes Relative: 6 % (ref 3–12)
NEUTROS ABS: 9.2 10*3/uL — AB (ref 1.7–7.7)
Neutrophils Relative %: 76 % (ref 43–77)

## 2014-09-04 LAB — CBC
HEMATOCRIT: 41.9 % (ref 36.0–46.0)
HEMOGLOBIN: 13.9 g/dL (ref 12.0–15.0)
MCH: 28.6 pg (ref 26.0–34.0)
MCHC: 33.2 g/dL (ref 30.0–36.0)
MCV: 86.2 fL (ref 78.0–100.0)
PLATELETS: 254 10*3/uL (ref 150–400)
RBC: 4.86 MIL/uL (ref 3.87–5.11)
RDW: 15.1 % (ref 11.5–15.5)
WBC: 12 10*3/uL — ABNORMAL HIGH (ref 4.0–10.5)

## 2014-09-04 LAB — URINALYSIS, ROUTINE W REFLEX MICROSCOPIC
Bilirubin Urine: NEGATIVE
Glucose, UA: NEGATIVE mg/dL
HGB URINE DIPSTICK: NEGATIVE
Ketones, ur: NEGATIVE mg/dL
LEUKOCYTES UA: NEGATIVE
Nitrite: NEGATIVE
PROTEIN: NEGATIVE mg/dL
Specific Gravity, Urine: 1.011 (ref 1.005–1.030)
Urobilinogen, UA: 0.2 mg/dL (ref 0.0–1.0)
pH: 6 (ref 5.0–8.0)

## 2014-09-04 LAB — I-STAT CHEM 8, ED
BUN: 42 mg/dL — AB (ref 6–20)
CALCIUM ION: 1.17 mmol/L (ref 1.12–1.23)
CREATININE: 2.3 mg/dL — AB (ref 0.44–1.00)
Chloride: 100 mmol/L — ABNORMAL LOW (ref 101–111)
Glucose, Bld: 168 mg/dL — ABNORMAL HIGH (ref 65–99)
HCT: 46 % (ref 36.0–46.0)
Hemoglobin: 15.6 g/dL — ABNORMAL HIGH (ref 12.0–15.0)
POTASSIUM: 4.2 mmol/L (ref 3.5–5.1)
SODIUM: 139 mmol/L (ref 135–145)
TCO2: 24 mmol/L (ref 0–100)

## 2014-09-04 LAB — COMPREHENSIVE METABOLIC PANEL
ALBUMIN: 3.7 g/dL (ref 3.5–5.0)
ALT: 17 U/L (ref 14–54)
ANION GAP: 10 (ref 5–15)
AST: 20 U/L (ref 15–41)
Alkaline Phosphatase: 91 U/L (ref 38–126)
BUN: 40 mg/dL — AB (ref 6–20)
CALCIUM: 9.5 mg/dL (ref 8.9–10.3)
CO2: 26 mmol/L (ref 22–32)
Chloride: 103 mmol/L (ref 101–111)
Creatinine, Ser: 2.39 mg/dL — ABNORMAL HIGH (ref 0.44–1.00)
GFR, EST AFRICAN AMERICAN: 26 mL/min — AB (ref >60)
GFR, EST NON AFRICAN AMERICAN: 23 mL/min — AB (ref >60)
GLUCOSE: 168 mg/dL — AB (ref 65–99)
Potassium: 4.3 mmol/L (ref 3.5–5.1)
SODIUM: 139 mmol/L (ref 135–145)
TOTAL PROTEIN: 7.2 g/dL (ref 6.5–8.1)
Total Bilirubin: 0.6 mg/dL (ref 0.3–1.2)

## 2014-09-04 LAB — I-STAT TROPONIN, ED: TROPONIN I, POC: 0.01 ng/mL (ref 0.00–0.08)

## 2014-09-04 LAB — ETHANOL

## 2014-09-04 LAB — RAPID URINE DRUG SCREEN, HOSP PERFORMED
AMPHETAMINES: NOT DETECTED
BENZODIAZEPINES: NOT DETECTED
Barbiturates: NOT DETECTED
COCAINE: NOT DETECTED
Opiates: POSITIVE — AB
Tetrahydrocannabinol: NOT DETECTED

## 2014-09-04 LAB — PROTIME-INR
INR: 0.98 (ref 0.00–1.49)
Prothrombin Time: 13.2 seconds (ref 11.6–15.2)

## 2014-09-04 LAB — APTT: aPTT: 29 seconds (ref 24–37)

## 2014-09-04 MED ORDER — LORAZEPAM 2 MG/ML IJ SOLN
2.0000 mg | Freq: Once | INTRAMUSCULAR | Status: AC
  Administered 2014-09-04: 2 mg via INTRAVENOUS
  Filled 2014-09-04: qty 1

## 2014-09-04 NOTE — Discharge Instructions (Signed)
Resume your current medications. Keep your scheduled appointment with Dr.Avbure

## 2014-09-04 NOTE — ED Notes (Signed)
Returned from MRI 

## 2014-09-04 NOTE — Consult Note (Signed)
Referring Physician: ED    Chief Complaint: code stroke, worsening right hemiparesis and dysarthria  HPI:                                                                                                                                         Karla Nelson is an 49 y.o. 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 Patient indicated that she was doing well until she returned from the pain clinic and started making her breakfast when she noticed that she was slurring her words and the right arm and leg were weaker. She said that this occurred about 1.5 hour after she received her methadone at the pain clinic. She called her husband and he called EMS concerned about a new stroke. NIHSS 2. CT brain was personally reviewed and showed no acute abnormality. She takes aspirin daily.   Date last known well: 09/04/14 Time last known well: 6:30 am tPA Given: no, stroke 3 days ago, minimal deficits NIHSS: 2 MRS: 0  Past Medical History  Diagnosis Date  . HTN (hypertension)     Has normal renal arteries.  . Cleft palate   . NSTEMI (non-ST elevated myocardial infarction) 09-25-2010  . DYSPNEA ON EXERTION 03/08/2007  . ELECTROCARDIOGRAM, ABNORMAL 02/13/2010  . CAD (coronary artery disease)     NSTEMI with cath in August 2012 with nonobstructive disease, 50% ostial D1 and 70% distal LCX and PL disease. Normal EF. Has diastolic dysfunction with elevated EDP  . Tobacco abuse disorder   . COPD (chronic obstructive pulmonary disease)   . Hyperlipidemia   . Asthma     Past Surgical History  Procedure Laterality Date  . Cesarean section    . Cardiac catheterization  Aug 2012    Moderate nonobstructive multivessel CAD with an EF of 70 to 75%  . Coronary angioplasty with stent  placement      Family History  Problem Relation Age of Onset  . Diabetes    . Hypertension    . Asthma Mother   . Asthma Brother   . Heart disease Mother    Social History:  reports that she has been smoking Cigarettes.  She has a 15.5 pack-year smoking history. She has never used smokeless tobacco. She reports that she does not drink alcohol or use illicit drugs.  Allergies: No Known Allergies  Medications:  I have reviewed the patient's current medications.  ROS:                                                                                                                                       History obtained from chart review and the patient  General ROS: negative for - chills, fatigue, fever, night sweats, weight gain or weight loss Psychological ROS: negative for - behavioral disorder, hallucinations, memory difficulties, mood swings or suicidal ideation Ophthalmic ROS: negative for - blurry vision, double vision, eye pain or loss of vision ENT ROS: negative for - epistaxis, nasal discharge, oral lesions, sore throat, tinnitus or vertigo Allergy and Immunology ROS: negative for - hives or itchy/watery eyes Hematological and Lymphatic ROS: negative for - bleeding problems, bruising or swollen lymph nodes Endocrine ROS: negative for - galactorrhea, hair pattern changes, polydipsia/polyuria or temperature intolerance Respiratory ROS: negative for - cough, hemoptysis, shortness of breath or wheezing Cardiovascular ROS: negative for - chest pain, dyspnea on exertion, edema or irregular heartbeat Gastrointestinal ROS: negative for - abdominal pain, diarrhea, hematemesis, nausea/vomiting or stool incontinence Genito-Urinary ROS: negative for - dysuria, hematuria, incontinence or urinary frequency/urgency Musculoskeletal ROS: negative for - joint  swelling Neurological ROS: as noted in HPI Dermatological ROS: negative for rash and skin lesion changes  Physical exam: pleasant female in no apparent distress. Blood pressure 149/77, pulse 71, temperature 98.7 F (37.1 C), temperature source Oral, resp. rate 14, last menstrual period 07/25/2014, SpO2 96 %. Head: normocephalic. Neck: supple, no bruits, no JVD. Cardiac: no murmurs. Lungs: clear. Abdomen: soft, no tender, no mass. Extremities: no edema. Skin: no rash  Neurologic Examination:                                                                                                      General: Mental Status: Alert, oriented, thought content appropriate.  Speech fluent without evidence of aphasia.  Able to follow 3 step commands without difficulty. Cranial Nerves: II: Discs flat bilaterally; Visual fields grossly normal, pupils equal, round, reactive to light and accommodation III,IV, VI: ptosis not present, extra-ocular motions intact bilaterally V,VII: smile symmetric, facial light touch sensation normal bilaterally VIII: hearing normal bilaterally IX,X: uvula rises symmetrically XI: bilateral shoulder shrug XII: midline tongue extension without atrophy or fasciculations Motor: Significant for mild right arm-leg weakness Tone and bulk:normal tone throughout; no atrophy noted Sensory: Pinprick and light touch intact throughout, bilaterally Deep Tendon Reflexes:  Right: Upper Extremity   Left: Upper extremity  biceps (C-5 to C-6) 2/4   biceps (C-5 to C-6) 2/4 tricep (C7) 2/4    triceps (C7) 2/4 Brachioradialis (C6) 2/4  Brachioradialis (C6) 2/4  Lower Extremity Lower Extremity  quadriceps (L-2 to L-4) 2/4   quadriceps (L-2 to L-4) 2/4 Achilles (S1) 2/4   Achilles (S1) 2/4  Plantars: Right: downgoing   Left: downgoing Cerebellar: normal finger-to-nose,  normal heel-to-shin test Gait:  No tested due to multiple leads    Results for orders placed or performed  during the hospital encounter of 09/04/14 (from the past 48 hour(s))  I-stat troponin, ED (not at [facility], [facility])     Status: None   Collection Time: 09/04/14  9:46 AM  Result Value Ref Range   Troponin i, poc 0.01 0.00 - 0.08 ng/mL   Comment 3            Comment: Due to the release kinetics of cTnI, a negative result within the first hours of the onset of symptoms does not rule out myocardial infarction with certainty. If myocardial infarction is still suspected, repeat the test at appropriate intervals.   Protime-INR     Status: None   Collection Time: 09/04/14  9:47 AM  Result Value Ref Range   Prothrombin Time 13.2 11.6 - 15.2 seconds   INR 0.98 0.00 - 1.49  APTT     Status: None   Collection Time: 09/04/14  9:47 AM  Result Value Ref Range   aPTT 29 24 - 37 seconds  CBC     Status: Abnormal   Collection Time: 09/04/14  9:47 AM  Result Value Ref Range   WBC 12.0 (H) 4.0 - 10.5 K/uL   RBC 4.86 3.87 - 5.11 MIL/uL   Hemoglobin 13.9 12.0 - 15.0 g/dL   HCT 41.9 36.0 - 46.0 %   MCV 86.2 78.0 - 100.0 fL   MCH 28.6 26.0 - 34.0 pg   MCHC 33.2 30.0 - 36.0 g/dL   RDW 15.1 11.5 - 15.5 %   Platelets 254 150 - 400 K/uL  Differential     Status: Abnormal   Collection Time: 09/04/14  9:47 AM  Result Value Ref Range   Neutrophils Relative % 76 43 - 77 %   Neutro Abs 9.2 (H) 1.7 - 7.7 K/uL   Lymphocytes Relative 15 12 - 46 %   Lymphs Abs 1.8 0.7 - 4.0 K/uL   Monocytes Relative 6 3 - 12 %   Monocytes Absolute 0.7 0.1 - 1.0 K/uL   Eosinophils Relative 2 0 - 5 %   Eosinophils Absolute 0.3 0.0 - 0.7 K/uL   Basophils Relative 1 0 - 1 %   Basophils Absolute 0.1 0.0 - 0.1 K/uL  I-Stat Chem 8, ED  (not at [facility], [facility])     Status: Abnormal   Collection Time: 09/04/14  9:48 AM  Result Value Ref Range   Sodium 139 135 - 145 mmol/L   Potassium 4.2 3.5 - 5.1 mmol/L   Chloride 100 (L) 101 - 111 mmol/L   BUN 42 (H) 6 - 20 mg/dL   Creatinine, Ser 2.30 (H) 0.44 - 1.00 mg/dL   Glucose, Bld 168 (H) 65  - 99 mg/dL   Calcium, Ion 1.17 1.12 - 1.23 mmol/L   TCO2 24 0 - 100 mmol/L   Hemoglobin 15.6 (H) 12.0 - 15.0 g/dL   HCT 46.0 36.0 - 46.0 %   Ct Head Wo Contrast  09/04/2014   CLINICAL DATA:  Progressive right sided weakness, hypertension,  recent left internal capsule stroke.  EXAM: CT HEAD WITHOUT CONTRAST  TECHNIQUE: Contiguous axial images were obtained from the base of the skull through the vertex without contrast.  COMPARISON:  None  FINDINGS: Stable chronic white matter microvascular changes throughout the cerebral hemispheres. Small hypodensity in the posterior limb of the left internal capsule, image 14 correlates with the small acute stroke demonstrated by MRI 3 days ago. No developing hemorrhage, significant mass effect, herniation or hydrocephalus. No extra-axial fluid collection. Cisterns are patent. No cerebellar abnormality. Orbits are symmetric. Chronic mucosal thickening of the ethmoid and maxillary sinuses. Mastoids are clear.  IMPRESSION: Stable chronic white matter microvascular ischemic changes  Small hypodensity posterior limb left internal capsule compatible with a known small acute infarct.  No significant interval change or hemorrhage by noncontrast CT.  These results were called by telephone at the time of interpretation on 09/04/2014 at 9:57 am to Dr. Aram Beecham , who verbally acknowledged these results.   Electronically Signed   By: Jerilynn Mages.  Shick M.D.   On: 09/04/2014 10:02     Assessment: 49 y.o. female comes in as a code stroke due to worsening right sided weakness and dysarthria. Patient reports significant improvement of her symptoms. NIHSS 2  and CT brain without acute abnormality. Of note, symptoms developed after receiving methadone at the pain clinic earlier today. No a candidate for thrombolysis due to recent stroke and minimal deficits. Additionally, can not exclude the possibility of medication induced symptoms. Patient had comprehensive stroke work up just few days ago,  thus at this moment will only recommend MRI brain to ruled out extension of recent stroke versus new small subcortical infarction. Patient should be able to go home if MRI negative. Continue current pharmacological management for secondary stroke prevention.  Stroke Risk Factors - HTN, hyperlipidemia, CAD,  smoker, and recent stroke.   Dorian Pod, MD [facility] [phone]  09/04/2014, 10:09 AM

## 2014-09-04 NOTE — ED Notes (Signed)
Pt able to ambulate in room without assistance

## 2014-09-04 NOTE — ED Provider Notes (Signed)
CSN: [id number]     Arrival date & time 09/04/14  [id number] History   First MD Initiated Contact with Patient 09/04/14 [phone]     No chief complaint on file.  Chief complaint difficulty speaking, left-sided weakness  (Consider location/radiation/quality/duration/timing/severity/associated sxs/prior Treatment) HPI Level V caveat code stroke, urgency of situation patient developed sudden onset difficulty speaking and increased weakness of right arm and right leg onset 7:30 AM today while at pain management clinic. Speech is most improved since onset. And weakness is much improved since onset as well. No other associated symptoms. No treatment prior to coming here. Past Medical History  Diagnosis Date  . HTN (hypertension)     Has normal renal arteries.  . Cleft palate   . NSTEMI (non-ST elevated myocardial infarction) 09-25-2010  . DYSPNEA ON EXERTION 03/08/2007  . ELECTROCARDIOGRAM, ABNORMAL 02/13/2010  . CAD (coronary artery disease)     NSTEMI with cath in August 2012 with nonobstructive disease, 50% ostial D1 and 70% distal LCX and PL disease. Normal EF. Has diastolic dysfunction with elevated EDP  . Tobacco abuse disorder   . COPD (chronic obstructive pulmonary disease)   . Hyperlipidemia   . Asthma    Past Surgical History  Procedure Laterality Date  . Cesarean section    . Cardiac catheterization  Aug 2012    Moderate nonobstructive multivessel CAD with an EF of 70 to 75%  . Coronary angioplasty with stent placement     Family History  Problem Relation Age of Onset  . Diabetes    . Hypertension    . Asthma Mother   . Asthma Brother   . Heart disease Mother    History  Substance Use Topics  . Smoking status: Current Every Day Smoker -- 0.50 packs/day for 31 years    Types: Cigarettes  . Smokeless tobacco: Never Used  . Alcohol Use: No   OB History    Gravida Para Term Preterm AB TAB SAB Ectopic Multiple Living            1     Review of Systems  Unable to perform ROS:  Acuity of condition  Neurological: Positive for speech difficulty and weakness.      Allergies  Review of patient's allergies indicates no known allergies.  Home Medications   Prior to Admission medications   Medication Sig Start Date End Date Taking? Authorizing Provider  ADVAIR DISKUS 100-50 MCG/DOSE AEPB INHALE 1 PUFF INTO THE LUNGS 2 (TWO) TIMES DAILY. Patient taking differently: INHALE 1 PUFF INTO THE LUNGS DAILY 08/26/14   Brand Males, MD  albuterol (PROVENTIL) (2.5 MG/3ML) 0.083% nebulizer solution Take 3 mLs (2.5 mg total) by nebulization every 6 (six) hours as needed for wheezing. 06/21/11   Sheryle Hail, NP  amLODipine (NORVASC) 10 MG tablet Take 10 mg by mouth at bedtime. 08/09/14   Historical Provider, MD  aspirin 325 MG tablet Take 1 tablet (325 mg total) by mouth daily. 09/02/14   Delfina Redwood, MD  atorvastatin (LIPITOR) 20 MG tablet Take 1 tablet (20 mg total) by mouth daily at 6 PM. 09/02/14   Delfina Redwood, MD  hydrALAZINE (APRESOLINE) 100 MG tablet Take 1 tablet (100 mg total) by mouth 3 (three) times daily. Patient not taking: Reported on 08/31/2014 09/04/12   Burtis Junes, NP  labetalol (NORMODYNE) 200 MG tablet Take 200 mg by mouth 2 (two) times daily. 08/09/14   Historical Provider, MD  METHADONE HCL PO Take 44 mg by mouth daily.  Historical Provider, MD  Multiple Vitamin (MULTIVITAMIN WITH MINERALS) TABS tablet Take 1 tablet by mouth daily.    Historical Provider, MD  nitroGLYCERIN (NITROSTAT) 0.4 MG SL tablet Place 0.4 mg under the tongue every 5 (five) minutes as needed for chest pain.    Historical Provider, MD  OXYGEN Inhale into the lungs. At night    Historical Provider, MD  PROAIR HFA 108 (90 BASE) MCG/ACT inhaler USE 2 PUFFS BY MOUTH EVERY 6 HOURS AS NEEDED FOR SHORTNESS OF BREATH 08/26/14   Brand Males, MD  SPIRIVA HANDIHALER 18 MCG inhalation capsule INHALE 1 CAPSULE VIA HANDIHALER ONCE DAILY AT THE SAME TIME EVERY DAY 06/10/14   Brand Males, MD   LMP 07/25/2014 Physical Exam  Constitutional: She is oriented to person, place, and time. She appears well-developed and well-nourished. No distress.  HENT:  Head: Normocephalic and atraumatic.  No facial asymmetry  Eyes: Conjunctivae are normal. Pupils are equal, round, and reactive to light.  Neck: Neck supple. No tracheal deviation present. No thyromegaly present.  Cardiovascular: Normal rate and regular rhythm.   No murmur heard. Pulmonary/Chest: Effort normal and breath sounds normal.  Abdominal: Soft. Bowel sounds are normal. She exhibits no distension. There is no tenderness.  Musculoskeletal: Normal range of motion. She exhibits no edema or tenderness.  Neurological: She is alert and oriented to person, place, and time. No cranial nerve deficit. Coordination normal.  Moves all extremity well. Motor strength right upper extremity 4/5, right lower extremity4/5 left upper extremity5/5 left lower extremity 5 / 5  Skin: Skin is warm and dry. No rash noted.  Psychiatric: She has a normal mood and affect.  Nursing note and vitals reviewed.   ED Course  Procedures (including critical care time) Labs Review Labs Reviewed  CBC - Abnormal; Notable for the following:    WBC 12.0 (*)    All other components within normal limits  DIFFERENTIAL - Abnormal; Notable for the following:    Neutro Abs 9.2 (*)    All other components within normal limits  I-STAT CHEM 8, ED - Abnormal; Notable for the following:    Chloride 100 (*)    BUN 42 (*)    Creatinine, Ser 2.30 (*)    Glucose, Bld 168 (*)    Hemoglobin 15.6 (*)    All other components within normal limits  ETHANOL  PROTIME-INR  APTT  COMPREHENSIVE METABOLIC PANEL  URINE RAPID DRUG SCREEN, HOSP PERFORMED  URINALYSIS, ROUTINE W REFLEX MICROSCOPIC (NOT AT [facility])  I-STAT TROPOININ, ED    Imaging Review No results found.   EKG Interpretation   Date/Time:  Wednesday September 04 2014 10:06:28 EDT Ventricular Rate:   64 PR Interval:  138 QRS Duration: 90 QT Interval:  439 QTC Calculation: 453 R Axis:   34 Text Interpretation:  Sinus rhythm Biatrial enlargement LVH with secondary  repolarization abnormality No significant change since last tracing  Confirmed by Winfred Leeds  MD, Madsen Riddle [phone]) on 09/04/2014 10:20:03 AM     Dr. Aram Beecham examined patient while in the ED. He felt that if MRI scan of brain negative. Patient could be discharged. 3:10 PM patient is alert ambulate unassisted. Fruits ready to go home. Results for orders placed or performed during the hospital encounter of 09/04/14  Ethanol  Result Value Ref Range   Alcohol, Ethyl (B) <5 <5 mg/dL  Protime-INR  Result Value Ref Range   Prothrombin Time 13.2 11.6 - 15.2 seconds   INR 0.98 0.00 - 1.49  APTT  Result Value Ref Range   aPTT 29 24 - 37 seconds  CBC  Result Value Ref Range   WBC 12.0 (H) 4.0 - 10.5 K/uL   RBC 4.86 3.87 - 5.11 MIL/uL   Hemoglobin 13.9 12.0 - 15.0 g/dL   HCT 41.9 36.0 - 46.0 %   MCV 86.2 78.0 - 100.0 fL   MCH 28.6 26.0 - 34.0 pg   MCHC 33.2 30.0 - 36.0 g/dL   RDW 15.1 11.5 - 15.5 %   Platelets 254 150 - 400 K/uL  Differential  Result Value Ref Range   Neutrophils Relative % 76 43 - 77 %   Neutro Abs 9.2 (H) 1.7 - 7.7 K/uL   Lymphocytes Relative 15 12 - 46 %   Lymphs Abs 1.8 0.7 - 4.0 K/uL   Monocytes Relative 6 3 - 12 %   Monocytes Absolute 0.7 0.1 - 1.0 K/uL   Eosinophils Relative 2 0 - 5 %   Eosinophils Absolute 0.3 0.0 - 0.7 K/uL   Basophils Relative 1 0 - 1 %   Basophils Absolute 0.1 0.0 - 0.1 K/uL  Comprehensive metabolic panel  Result Value Ref Range   Sodium 139 135 - 145 mmol/L   Potassium 4.3 3.5 - 5.1 mmol/L   Chloride 103 101 - 111 mmol/L   CO2 26 22 - 32 mmol/L   Glucose, Bld 168 (H) 65 - 99 mg/dL   BUN 40 (H) 6 - 20 mg/dL   Creatinine, Ser 2.39 (H) 0.44 - 1.00 mg/dL   Calcium 9.5 8.9 - 10.3 mg/dL   Total Protein 7.2 6.5 - 8.1 g/dL   Albumin 3.7 3.5 - 5.0 g/dL   AST 20 15 - 41 U/L    ALT 17 14 - 54 U/L   Alkaline Phosphatase 91 38 - 126 U/L   Total Bilirubin 0.6 0.3 - 1.2 mg/dL   GFR calc non Af Amer 23 (L) >60 mL/min   GFR calc Af Amer 26 (L) >60 mL/min   Anion gap 10 5 - 15  I-Stat Chem 8, ED  (not at [facility])  Result Value Ref Range   Sodium 139 135 - 145 mmol/L   Potassium 4.2 3.5 - 5.1 mmol/L   Chloride 100 (L) 101 - 111 mmol/L   BUN 42 (H) 6 - 20 mg/dL   Creatinine, Ser 2.30 (H) 0.44 - 1.00 mg/dL   Glucose, Bld 168 (H) 65 - 99 mg/dL   Calcium, Ion 1.17 1.12 - 1.23 mmol/L   TCO2 24 0 - 100 mmol/L   Hemoglobin 15.6 (H) 12.0 - 15.0 g/dL   HCT 46.0 36.0 - 46.0 %  I-stat troponin, ED (not at [facility], [facility])  Result Value Ref Range   Troponin i, poc 0.01 0.00 - 0.08 ng/mL   Comment 3           Dg Chest 2 View  09/01/2014   CLINICAL DATA:  Acute cerebral infarction with right-sided weakness.  EXAM: CHEST - 2 VIEW  COMPARISON:  07/25/2013  FINDINGS: The heart size and mediastinal contours are within normal limits. There is no evidence of pulmonary edema, consolidation, pneumothorax, nodule or pleural fluid. The visualized skeletal structures are unremarkable.  IMPRESSION: No active disease.   Electronically Signed   By: Aletta Edouard M.D.   On: 09/01/2014 10:18   Ct Head Wo Contrast  09/04/2014   CLINICAL DATA:  Progressive right sided weakness, hypertension, recent left internal capsule stroke.  EXAM: CT HEAD WITHOUT CONTRAST  TECHNIQUE:  Contiguous axial images were obtained from the base of the skull through the vertex without contrast.  COMPARISON:  None  FINDINGS: Stable chronic white matter microvascular changes throughout the cerebral hemispheres. Small hypodensity in the posterior limb of the left internal capsule, image 14 correlates with the small acute stroke demonstrated by MRI 3 days ago. No developing hemorrhage, significant mass effect, herniation or hydrocephalus. No extra-axial fluid collection. Cisterns are patent. No cerebellar abnormality. Orbits are  symmetric. Chronic mucosal thickening of the ethmoid and maxillary sinuses. Mastoids are clear.  IMPRESSION: Stable chronic white matter microvascular ischemic changes  Small hypodensity posterior limb left internal capsule compatible with a known small acute infarct.  No significant interval change or hemorrhage by noncontrast CT.  These results were called by telephone at the time of interpretation on 09/04/2014 at 9:57 am to Dr. Aram Beecham , who verbally acknowledged these results.   Electronically Signed   By: Jerilynn Mages.  Shick M.D.   On: 09/04/2014 10:02   Ct Head Wo Contrast  08/31/2014   CLINICAL DATA:  49 year old female with right-sided weakness  EXAM: CT HEAD WITHOUT CONTRAST  TECHNIQUE: Contiguous axial images were obtained from the base of the skull through the vertex without intravenous contrast.  COMPARISON:  Facial CT dated 01/16/2007  FINDINGS: The ventricles and sulci are appropriate in size for patient's age. Minimal periventricular and deep white matter hypodensities represent chronic microvascular ischemic changes. There is no intracranial hemorrhage. No mass effect or midline shift identified.  There is diffuse mucoperiosteal thickening and partial opacification of the ethmoid air cells. The mastoid air cells are well aerated. The calvarium is intact.  IMPRESSION: No acute intracranial pathology.  Diffuse mucoperiosteal thickening and partial opacification of the ethmoid air cells.   Electronically Signed   By: Anner Crete M.D.   On: 08/31/2014 20:58   Mr Jodene Nam Head Wo Contrast  09/01/2014   CLINICAL DATA:  Initial evaluation for acute stroke.  EXAM: MRA HEAD WITHOUT CONTRAST  TECHNIQUE: Angiographic images of the Circle of Willis were obtained using MRA technique without intravenous contrast.  COMPARISON:  Prior MRI performed earlier on the same day.  FINDINGS: ANTERIOR CIRCULATION:  Study is degraded by motion artifact.  Visualized distal cervical segments of the internal carotid arteries are  patent with antegrade flow. The petrous, cavernous, and supra clinoid segments are widely patent. Left A1 segment widely patent. Right A1 segment hypoplastic or absent. Anterior communicating artery normal. Anterior cerebral arteries well opacified.  M1 segments widely patent without stenosis or occlusion. MCA bifurcations grossly normal. Distal MCA branches fairly symmetric and well opacified bilaterally.  POSTERIOR CIRCULATION:  Both the vertebral arteries are widely patent to the vertebrobasilar junction. Right vertebral artery dominant. Right posterior inferior cerebral artery patent. Left posterior inferior cerebral artery not visualized. Basilar artery widely patent. Superior cerebellar arteries patent proximally. There is fetal origin of the right PCA with widely patent right posterior communicating artery. Left P1 and P2 segments widely patent.  No aneurysm or vascular malformation.  IMPRESSION: 1. No large or proximal arterial branch occlusion identified within the intracranial circulation. 2. No hemodynamically significant or correctable stenosis. 3. Hypoplastic/absent right A1 segment, with the anterior cerebral arteries supplied via the left carotid artery system. 4. Fetal origin of the right PCA.   Electronically Signed   By: Jeannine Boga M.D.   On: 09/01/2014 02:19   Mr Brain Wo Contrast  09/04/2014   CLINICAL DATA:  Code stroke. Progressive right hemi paresis and dysarthria.  EXAM:  MRI HEAD WITHOUT CONTRAST  TECHNIQUE: Multiplanar, multiecho pulse sequences of the brain and surrounding structures were obtained without intravenous contrast.  COMPARISON:  CT head from the same day.  MRI brain 09/01/2014.  FINDINGS: An acute nonhemorrhagic infarct within the posterior limb of the left internal capsule is not significantly changed in size, measuring 12 mm maximally. No new infarct is present. There is no acute hemorrhage or mass lesion. T2 changes associated with the acute infarct are more  conspicuous on today's exam. Periventricular and subcortical T2 changes are otherwise stable.  Flow is present in the major intracranial arteries. The globes and orbits are intact.  Mild mucosal thickening is present in the inferior frontal sinuses bilaterally, the residual posterior ethmoid air cells, and bilateral maxillary sinuses. There is mild mucosal thickening in the sphenoid sinuses bilaterally. The turbinates are absent.  The globes and orbits are intact.  IMPRESSION: 1. Stable appearance of posterior limb left internal capsule acute/subacute nonhemorrhagic infarct measuring up to 12 mm in maximal dimension. 2. Periventricular and subcortical T2 changes are otherwise stable. 3. Mild to moderate chronic sinus disease is similar to the prior study. 4. The nasal turbinates are absent.  This may be postsurgical.   Electronically Signed   By: San Morelle M.D.   On: 09/04/2014 14:53   Mr Brain Wo Contrast  09/01/2014   CLINICAL DATA:  Initial evaluation for acute right-sided weakness for 3 days.  EXAM: MRI HEAD WITHOUT CONTRAST  TECHNIQUE: Multiplanar, multiecho pulse sequences of the brain and surrounding structures were obtained without intravenous contrast.  COMPARISON:  Prior CT from 09/01/2014  FINDINGS: Mild diffuse prominence of the CSF containing spaces is compatible with generalized cerebral atrophy. Patchy T2/FLAIR hyperintensity within the periventricular and deep white matter both cerebral hemispheres noted, nonspecific, but most likely related to mild chronic small vessel ischemic disease. No definite areas of chronic infarction. Possible small remote lacunar infarct within the left lentiform nucleus.  There is an acute ischemic nonhemorrhagic infarct involving the posterior limb of the left internal capsule measuring 13 x 5 x 13 mm (series 3, image 21). No significant mass effect. No associated hemorrhage. No other areas of infarction. Gray-white matter differentiation maintained. Normal  intravascular flow voids preserved. No acute intracranial hemorrhage.  No mass lesion, midline shift, or mass effect. No hydrocephalus. No extra-axial fluid collection.  Craniocervical junction within normal limits. Pituitary gland grossly unremarkable.  No acute abnormality about the orbits. Scattered mucosal thickening present within the maxillary sinuses, sphenoid sinuses, and ethmoidal air cells. No air-fluid level to suggest active sinus infection. No mastoid effusion. Inner ear structures grossly normal.  Bone marrow signal intensity within normal limits. Scalp soft tissues unremarkable.  IMPRESSION: 1. 13 x 5 x 13 mm acute ischemic nonhemorrhagic infarct involving the posterior limb of the left internal capsule. 2. No other acute intracranial process. 3. Mild atrophy with chronic small vessel ischemic disease.   Electronically Signed   By: Jeannine Boga M.D.   On: 09/01/2014 01:02   US Renal  08/20/2014   CLINICAL DATA:  Chronic renal disease.  EXAM: RENAL / URINARY TRACT ULTRASOUND COMPLETE  COMPARISON:  None.  FINDINGS: Right Kidney:  Length: 9.4 cm. Cortical thinning. Echogenicity within normal limits. No mass or hydronephrosis visualized.  Left Kidney:  Length: 11.8 cm. Echogenicity within normal limits. No mass or hydronephrosis visualized.  Bladder:  Appears normal for degree of bladder distention.  IMPRESSION: 1. Mild right renal atrophy.  2.  No acute abnormality.  No  hydronephrosis or bladder distention.   Electronically Signed   By: Marcello Moores  Register   On: 08/20/2014 13:07   Mm Digital Screening Bilateral  08/29/2014   CLINICAL DATA:  Screening.  EXAM: DIGITAL SCREENING BILATERAL MAMMOGRAM WITH CAD  COMPARISON:  Previous exam(s).  ACR Breast Density Category b: There are scattered areas of fibroglandular density.  FINDINGS: There are no findings suspicious for malignancy. Images were processed with CAD.  IMPRESSION: No mammographic evidence of malignancy. A result letter of this  screening mammogram will be mailed directly to the patient.  RECOMMENDATION: Screening mammogram in one year. (Code:[id number]-B-01Y)  BI-RADS CATEGORY  1: Negative.   Electronically Signed   By: Andres Shad   On: 08/29/2014 15:09    MDM  Serum creatinine unchanged from 3 days ago. Plan resume current medications. F/u Dr. Barbette Or as scheduled Dx#1 weakness #2 hyperglycemia #3 renal insufficiency Final diagnoses:  None        Orlie Dakin, MD 09/04/14 1538

## 2014-09-04 NOTE — ED Notes (Signed)
Patient transported to MRI 

## 2014-09-04 NOTE — Code Documentation (Signed)
49yo female arriving to [facility] via [place] at [phone].  EMS reports that the patient had a stroke on Saturday with right sided deficits.  Patient was at home this morning at her baseline at 0630.  At 0730 she went to a pain clinic and received Methadone.  Patient was later noted to have slurred speech and difficulty with her gait.  EMS called and activated Code Stroke.  Patient reports pain in the left side of her neck and reports that the symptoms started about an hour after taking "liquid Methadone".  Stroke team at the bedside on arrival.  Labs drawn and patient cleared by Dr. Winfred Leeds.  Patient to CT.  NIHSS 2, see documentation for details and code stroke times.  Patient with right arm and leg drift on exam.  Patient recently admitted for stroke and had a MRI on 09/01/14 showing acute ischemic nonhemorrhagic infarct involving the posterior limb of the left internal capsule.  Patient discharged from the hospital on 09/02/14 with NIHSS 1 for right leg drift.  Patient is contraindicated for treatment with tPA d/t recent infarct.  Dr. Armida Sans at the bedside.  No acute stroke treatment at this time.  Bedside handoff with ED RN [place].

## 2014-09-04 NOTE — ED Notes (Signed)
Patient is resting comfortably. Called MRI  Scheduled for approx. 1330  , pt updated

## 2014-09-04 NOTE — ED Notes (Signed)
Pt here from methadone clinic with c/o slurred speech and right sided weakness , pt was here sat for a stroke

## 2014-09-10 ENCOUNTER — Other Ambulatory Visit: Payer: Self-pay

## 2014-09-10 NOTE — Patient Outreach (Signed)
[facility]) Care Management  09/10/2014  Karla Nelson September 18, 1965 [id number]  EMMI STROKE PROGRAM NOTE:  Referral Date:  09/10/2014  Issue:  Patient triggered RED on EMMI Stroke [place].  Feeling worse overall?  Yes Questions/problems with meds? Yes Listened to these previous topics:  Blood Thinners Smoked or been around smoke? Yes Emmi Program:   -SMOKING CESSATION - THINKING ABOUT QUITTING SMOKING:  Issued 09/09/14 -STROKE PREVENTING SECONDARY STROKE:  Issued 09/04/14  Outreach call #1 to patient x 2 attempts.  Patient not reached at [phone].   RN CM unable to leave message (per automation voice mail is not currently accepting calls and has not been activated).    Plan: Rescheduled for outreach call #2 within 24 hours.  H/O admission 08/31/2014 - 09/02/2014  Acute ischemic stroke RN CM identifies patient is high risk for readmission due to ED visit 09/04/14.  Per EPIC MR 09/04/14:  "Pt here from methadone clinic with C/O slurred speech and right sided weakness , pt was here sat for a stroke."  Mariann Laster, RN, BSN, [facility], [place] Management Care Management Coordinator [phone] Office [phone] Direct [phone] Cell

## 2014-09-10 NOTE — Patient Outreach (Signed)
[facility]) Care Management  09/10/2014  Leanora Lamberg Jun 14, 1965 [id number]   RED on EMMI Stroke Dashboard, assigned Mariann Laster, RN to outreach.  Ronnell Freshwater. [place], [place] Management [place] Assistant Phone: [phone] Fax: [phone]

## 2014-09-11 ENCOUNTER — Other Ambulatory Visit: Payer: Self-pay

## 2014-09-11 NOTE — Patient Outreach (Signed)
[facility]) Care Management  09/11/2014  Alaiza Kasch [birth year] [id number]   EMMI STROKE PROGRAM NOTE:  Referral Date: 09/10/2014  Issue: Patient triggered RED on EMMI Stroke [place].  Feeling worse overall? Yes Questions/problems with meds? Yes Listened to these previous topics: Blood Thinners Smoked or been around smoke? Yes Emmi Program:  -SMOKING CESSATION - THINKING ABOUT QUITTING SMOKING: Issued 09/09/14 -STROKE PREVENTING SECONDARY STROKE: Issued 09/04/14  Outreach call #2  to patient. Patient not reached at [phone] home.  RN CM unable to leave message (per automation voice mail is not currently accepting calls and has not been activated).   Plan: Rescheduled for outreach call #3 within 24 hours.  H/O admission 08/31/2014 - 09/02/2014 Acute ischemic stroke RN CM identifies patient is high risk for readmission due to ED visit 09/04/14. Per EPIC MR 09/04/14: "Pt here from methadone clinic with C/O slurred speech and right sided weakness , pt was here sat for a stroke."  Mariann Laster, RN, BSN, [facility], [place] Management Care Management Coordinator [phone] Office [phone] Direct [phone] Cell

## 2014-09-12 ENCOUNTER — Other Ambulatory Visit: Payer: Self-pay

## 2014-09-12 NOTE — Patient Outreach (Signed)
[name] Care Management  09/12/2014  Karla Nelson 1966/01/16 [id number]  EMMI STROKE PROGRAM NOTE:  Referral Date: 09/10/2014  Issue: Patient triggered RED on EMMI Stroke [place].  Feeling worse overall? Yes Questions/problems with meds? Yes Listened to these previous topics: Blood Thinners Smoked or been around smoke? Yes Emmi Program:  -SMOKING CESSATION - THINKING ABOUT QUITTING SMOKING: Issued 09/09/14 -STROKE PREVENTING SECONDARY STROKE: Issued 09/04/14 H/O admission 08/31/2014 - 09/02/2014 Acute ischemic stroke RN CM identifies patient is high risk for readmission due to ED visit 09/04/14. Per EPIC MR 09/04/14: "Pt here from methadone clinic with C/O slurred speech and right sided weakness , pt was here sat for a stroke."  Outreach call #3. Patient not reached at [phone].  RN CM unable to leave message (per automation voice mail is not currently accepting calls and voice mail has not been activated).   Plan: RN CM will send patient unsuccessful outreach letter.  RN CM will follow-up within 10 days for patient response and close case if unable to engage.   Mariann Laster, RN, BSN, [facility], CCM  [employer] Management Coordinator [phone] Direct [phone] Cell [phone] Office [phone] Fax

## 2014-09-12 NOTE — Patient Outreach (Signed)
[place] [facility]) Care Management  09/12/2014  Karla Nelson 1965-08-25 [id number]   Unsuccessful outreach letter sent to patient. (See Letters)  Mariann Laster, RN, BSN, [facility], [place] Management Care Management Coordinator [phone] Direct [phone] Cell [phone] Office [phone] Fax

## 2014-09-19 ENCOUNTER — Other Ambulatory Visit: Payer: Self-pay | Admitting: Internal Medicine

## 2014-09-21 NOTE — ED Provider Notes (Deleted)
Verbal order provided to nursing for triage assistance. Patient was not seen or evaluated by myself for the encounter on 7\23/2016. Note for this encounter has been done by the providers [facility] and [facility].  Charlesetta Shanks, MD 09/21/14 [phone]

## 2014-09-26 ENCOUNTER — Other Ambulatory Visit: Payer: Self-pay

## 2014-09-26 ENCOUNTER — Other Ambulatory Visit: Payer: Self-pay | Admitting: Internal Medicine

## 2014-09-26 NOTE — Patient Outreach (Signed)
[place] [facility]) Care Management  09/26/2014  Karla Nelson [birth year] [id number]   EMMI STROKE PROGRAM NOTE:  Referral Date: 09/10/2014  Issue: Patient triggered RED on EMMI Stroke [place].  Feeling worse overall? Yes Questions/problems with meds? Yes Listened to these previous topics: Blood Thinners Smoked or been around smoke? Yes Emmi Program:  -SMOKING CESSATION - THINKING ABOUT QUITTING SMOKING: Issued 09/09/14 -STROKE PREVENTING SECONDARY STROKE: Issued 09/04/14 H/O admission 08/31/2014 - 09/02/2014 Acute ischemic stroke RN CM identifies patient is high risk for readmission due to ED visit 09/04/14. 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 Patient reached at [phone]. Emmi Stroke Service Week 3 PCP:  Patient confirms she followed-up with her Primary MD post stroke and has another appt tomorrow 09/27/14.  Neurology appt scheduled for 11/04/14 at 8:30am.  Insurance:  Medicare and MCD  Social: Single and lives in her home alone at this time.  States sadness regarding her significant other who has been with her for 13 years left her during her stroke.   Ambulating with some Right foot drag and using a cane.  Denies any safety concerns or falls. Completed home PT session on Monday 09/23/2014 and service discontinued.  Caregiver:  Parents and adult son. Transportation:  Parents, adult son and self/patient DME:  Cane, walker with wheels, BSC  Stroke States H/O MI 2 years ago.  Smoker:  H/O 1 pack per day but working on smoking cessation by using Vapor to decrease daily usage.  Weight 167 Height 5'5".  RN CM reviewed signs / symptoms of stroke.  Patient was able to state without coaching.  RN CM encouraged to contact 911 to seek urgent attention should patient have stroke symptoms.  RN CM discussed stroke risk factors:  Smoking, HTN  RN CM sent  EMMI ED: Benefits Of Quitting Tobacco 09/26/2914  Medications:   States taking as ordered, not missing any dosages and getting all meds ordered.  Medication changed on Primary MD follow-up appt:  hydralazine increased from 100 mg  to 300 mg.    RN CM encouraged to take medications as prescribed.    Plan: RN CM will follow-up within one week for Emmi Stroke Week 4 call.  RN CM notified [facility] case opened: agreed to services.   Mariann Laster, RN, BSN, [facility], CCM  [employer] Management Coordinator [phone] Direct [phone] Cell [phone] Office [phone] Fax

## 2014-10-03 ENCOUNTER — Other Ambulatory Visit: Payer: Self-pay | Admitting: Nurse Practitioner

## 2014-10-03 ENCOUNTER — Other Ambulatory Visit: Payer: Self-pay

## 2014-10-03 NOTE — Patient Outreach (Signed)
[place] [facility]) Care Management  10/03/2014  Karla Nelson January 13, 1966 [id number]   EMMI STROKE PROGRAM NOTE: (Week 4)  Referral Date: 09/10/2014  Issue: Emmi Stroke Program   PCP: Nolene Ebbs, MD  - last appt 09/27/14 - next appt approximately 10/28/14.  Neurologist:  Dr. Leonie Man - next appt 11/04/14 0900.  Insurance: Medicare and Medicaid  Social:  Single and lives in her home alone at this time. H/O sadness regarding her significant other who has been with her for 13 years left her during her stroke.Patient has progress to ambulating with no cane.  Denies any safety concerns or falls. Completed home PT services 09/23/2014; patient states she believes PT was helpful.  Caregiver: Parents and adult son. Transportation: Parents, adult son and self/patient DME: Kasandra Knudsen, walker with wheels, BSC  Stroke H/O admission 08/31/2014 - 09/02/2014 Acute ischemic stroke States H/O MI 2 years ago.  Smoker: H/O 1 pack per day but working on smoking cessation by using Vapor to decrease daily usage.  Patient has never used any cessation medications.  Patient was able to quit 35 years ago during pregnancy for one year.  Weight 167 - states down from 174.  Height 5'5".  BP 150/80 on visit today.   Memory Issues / Work Issues:   Works at [employer] and returned back to work on Tuesday 10/01/14 but was not able to remember some of the things she normally remembers on her job.  Patient worked 4 hours that day but has not been able to return to work.  Patient is active with Disability Work hours and has worked 18-23 hours per week for 9 years.  Patient C/O heavy feeling in her head and at the back of her neck.  States she feels like she has "stuff" in her head like she does with a sinus infection.  Patient C/O her upper back feeling swollen and hot.  States difficulty identifying if Sx's are related to her stroke or "the change of life" type hot flashes.  Depression:  Yes - states she  feels very emotional and sad.  RN CM reviewed signs / symptoms of stroke. Patient was able to state without coaching.  RN CM encouraged to contact 911 to seek urgent attention should patient have stroke symptoms.  RN CM discussed stroke risk factors: Smoking, HTN  RN CM sent EMMI ED: Benefits Of Quitting Tobacco 09/26/2914 RN CM provided patient with educational document:  Benefits of Quitting Smoking from 20 minutes to 15 years 10/03/14.  RN CM will notify Primary MD of change in condition.   Medications:  States taking as ordered, not missing any dosages and getting all meds ordered. Medication changed on Primary MD follow-up appt: hydralazine increased from 100 mg to 300 mg.  RN CM encouraged to take medications as prescribed.   Plan: RN CM contacted PCP:  Dr Jeanie Cooks via in-basket to report change in condition and need for office appt to assess patient.  RN CM contacted MD office and obtained office appt for tomorrow 11/04/14 -come before 11:00am.  Patient provided copy of appointment details. RN CM will follow-up within one week for transition and Triage for continued [facility] services: HTN, COPD if needs identified on triage.  RN CM notified [facility] administrative assistant to close case - EMMI Program Week 4 completed and will complete [facility] triage call within one week.  RN CM notified Neurologist Dr. Leonie Man via letter notification.   Mariann Laster, RN, BSN, [place], [facility] Care Management Care Management  Coordinator [phone] Direct [phone] Cell [phone] Office [phone] Fax

## 2014-10-03 NOTE — Telephone Encounter (Signed)
Ok to refill? Please advise. Thanks, MI 

## 2014-10-03 NOTE — Telephone Encounter (Signed)
Refill what?

## 2014-10-07 ENCOUNTER — Other Ambulatory Visit: Payer: Self-pay | Admitting: Internal Medicine

## 2014-10-10 ENCOUNTER — Other Ambulatory Visit: Payer: Self-pay

## 2014-10-10 DIAGNOSIS — Z8709 Personal history of other diseases of the respiratory system: Secondary | ICD-10-CM

## 2014-10-10 NOTE — Patient Outreach (Signed)
[place] [facility]) Care Management  10/10/2014  Arlone Wehrer 1965-06-29 [id number]   Request from Mariann Laster, RN to assign [place] and Pharmacy, assigned Jon Billings, RN and Deanne Coffer, PharmD.  Thanks, Ronnell Freshwater. [place], [place] Assistant Phone: [phone] Fax: [phone]

## 2014-10-10 NOTE — Patient Outreach (Signed)
[place] [facility]) Care Management  10/10/2014  Jalon Isherwood 1965-11-05 [id number]  Telephonic Care Management Note:  Screening  Referral Date: 10/03/2014  Referral Source:  Queena Monrreal RN BSN, MSHL, CCM following completion of Emmi Stroke Program Week 4 services.  Issue:HTN, COPD Insurance: Medicare and Medicaid  PCP: Nolene Ebbs, MD - last appointment 09/27/14 - next appt approximately 10/28/14.  Neurologist: Dr. Leonie Man - next appt 11/04/14 0900.   Social:  Single and lives in her home alone at this time.Patient has progressed to ambulating with no cane. Denies any safety concerns or falls. Completed home PT services 09/23/2014; patient states she believes PT was helpful.  Caregiver: Parents and adult son. Transportation: Parents, adult son and self/patient DME: Kasandra Knudsen, walker with wheels, BSC  Stroke H/O admission 08/31/2014 - 09/02/2014 Acute ischemic stroke States H/O MI 2 years ago.  Smoker: H/O 1 pack per day but working on smoking cessation by using Vapor to decrease daily usage.  Patient admits she is still smoking regular cigarettes too.  Patient has states she has used Wellbutrin in the past but did not work out for her.  Patient was able to quit 35 years ago during pregnancy for one year.   Weight 166 -  H/O down from 174.  Height 5'5" BMI 27.7  BP 150/80 - last MD appt per patient.  Patient states she plans to purchase a digital BP cuff tomorrow and knows how to use it as she uses her mothers. Patient able to provide teach back on Stroke Sx's  Memory Issues / Work Issues: Works at [employer] and returned back to work on Tuesday 10/01/14 but was not able to remember some of the things she normally remembers on her job. Patient worked 4 hours that day but has not been able to return to work. Patient is active with Disability Work hours and has worked 18-23 hours per week for 9 years.  Patient C/O heavy feeling in her head and at the  back of her neck. States she feels like she has "stuff" in her head like she does with a sinus infection. Patient C/O her upper back feeling swollen and hot. States difficulty identifying if Sx's are related to her stroke or "the change of life" type hot flashes.  Patient followed up with MD 11/04/14 who assessed patient's reported changes.  Patient updates that MD has taken her out of work for another month.    EMMI ED: Benefits Of Quitting Tobacco 09/26/2914 and  educational document: Benefits of Quitting Smoking from 20 minutes to 15 years 10/03/14. (Reviewed 10/10/14).    Medications: More than 10  Patient confirms compliance and not missing any dosages.   RN CM encouraged to take medications as prescribed.   Consent:  Patient agreed to [place]: COPD  Plan: RN CM sent referral to Health Coach -COPD RN CM sent referral to Pharmacy -more than 10 meds.  RN CM notified [facility] administrative assistant to open case:  Patient agreed to continued [facility] services via [place].   H/O Emmi Stroke program completed 10/03/14.   Mariann Laster, RN, BSN, [facility], CCM  [employer] Management Coordinator [phone] Direct [phone] Cell [phone] Office [phone] Fax

## 2014-10-11 ENCOUNTER — Other Ambulatory Visit: Payer: Self-pay | Admitting: Pharmacist

## 2014-10-11 NOTE — Patient Outreach (Signed)
Karla Nelson [facility]) Care Management  [place]   10/11/2014  Karla Nelson 07/29/1965 [id number]  Subjective: Karla Nelson is a 49 y.o. female who was referred to [place] for a medication review.   Objective:   Current Medications: Current Outpatient Prescriptions  Medication Sig Dispense Refill  . ADVAIR DISKUS 100-50 MCG/DOSE AEPB INHALE 1 PUFF INTO THE LUNGS 2 (TWO) TIMES DAILY. 60 each 0  . albuterol (PROVENTIL) (2.5 MG/3ML) 0.083% nebulizer solution Take 3 mLs (2.5 mg total) by nebulization every 6 (six) hours as needed for wheezing. 75 mL 12  . amLODipine (NORVASC) 10 MG tablet Take 10 mg by mouth at bedtime.  12  . aspirin 325 MG tablet Take 1 tablet (325 mg total) by mouth daily.    Marland Kitchen atorvastatin (LIPITOR) 20 MG tablet Take 1 tablet (20 mg total) by mouth daily at 6 PM. 30 tablet 1  . hydrALAZINE (APRESOLINE) 100 MG tablet TAKE 1 TABLET (100 MG TOTAL) BY MOUTH 3 (THREE) TIMES DAILY. 90 tablet 1  . labetalol (NORMODYNE) 200 MG tablet Take 200 mg by mouth 2 (two) times daily.  12  . METHADONE HCL PO Take 44 mg by mouth daily.     . Multiple Vitamin (MULTIVITAMIN WITH MINERALS) TABS tablet Take 1 tablet by mouth daily.    . nitroGLYCERIN (NITROSTAT) 0.4 MG SL tablet Place 0.4 mg under the tongue every 5 (five) minutes as needed for chest pain.    . OXYGEN Inhale into the lungs. At night    . PROAIR HFA 108 (90 BASE) MCG/ACT inhaler USE 2 PUFFS BY MOUTH EVERY 6 HOURS AS NEEDED FOR SHORTNESS OF BREATH 8.5 Inhaler 0  . SPIRIVA HANDIHALER 18 MCG inhalation capsule INHALE 1 CAPSULE VIA HANDIHALER ONCE DAILY AT THE SAME TIME EVERY DAY 30 capsule 1   No current facility-administered medications for this visit.    Functional Status: In your present state of health, do you have any difficulty performing the following activities: 10/10/2014 09/26/2014  Hearing? N N  Vision? N N  Difficulty concentrating or making decisions? N N  Walking or climbing stairs?  N Y  Dressing or bathing? N N  Doing errands, shopping? N N  Preparing Food and eating ? N N  Using the Toilet? N N  In the past six months, have you accidently leaked urine? N N  Do you have problems with loss of bowel control? N N  Managing your Medications? N N  Managing your Finances? N N  Housekeeping or managing your Housekeeping? N N    Fall/Depression Screening: PHQ 2/9 Scores 10/03/2014 10/03/2014 09/26/2014  PHQ - 2 Score 2 0 0  PHQ- 9 Score 6 - -    Assessment:  Drugs sorted by system:  Neurologic/Psychologic:  Cardiovascular: amlodipine, aspirin, atorvastatin, hydralazine, labetalol, nitroglycerin SL  Pulmonary/Allergy: Advair Diskus, albuterol, Spiriva, oxygen  Gastrointestinal: none noted  Endocrine: none noted  Renal: none noted  Topical: none noted  Pain: methadone  Miscellaneous: none noted   Duplications in therapy: none Gaps in therapy: ACEi for CAD - stopped in the hospital for progressive renal failure. Patient is on statin and aspirin.  Medications to avoid in the elderly: patient <65 Drug interactions:none noted Other issues noted: none   Plan: 1. Medication review: all medications appear to be appropriate based on patient's problem list. I believe that the benefits outweigh the risks for the use of ACEi/ARB. Patient had a recent stroke and has CAD which are both compelling  indications for ACEi/ARB use. Unless the renal failure is attributed to ACEi/ARB use, these medications are still indicated. Will send a fax to Dr. Santiago Bur office with this information. No other issues noted. Will close out of pharmacy program.   Nicoletta Ba, PharmD, [place] [phone]

## 2014-10-18 ENCOUNTER — Other Ambulatory Visit: Payer: Self-pay

## 2014-10-18 NOTE — Patient Outreach (Signed)
  [place] [facility]) Care Management  10/18/2014  Shone Grennell [birth year] [id number]   Telephone call to patient for initial health coach assessment.  No answer.  Unable to leave message.  Phone states not accepting messages voice mail not set up.    Plan: RN Health Coach will attempt patient within 1-2 weeks.    Jone Baseman, RN, MSN [place] [phone]

## 2014-10-21 ENCOUNTER — Other Ambulatory Visit: Payer: Self-pay

## 2014-10-21 DIAGNOSIS — J449 Chronic obstructive pulmonary disease, unspecified: Secondary | ICD-10-CM

## 2014-10-21 NOTE — Patient Outreach (Addendum)
[place] [facility]) Care Management  10/21/2014  Casady Khanna 1966/01/13 [id number]   Request from Jon Billings, RN to assign SW, assigned [employer], LCSW.  Thanks, Ronnell Freshwater. [place], [place] Assistant Phone: [phone] Fax: [phone]

## 2014-10-21 NOTE — Patient Outreach (Signed)
[place] [facility]) Care Management  [place]  10/21/2014   Karla Nelson 08-06-1965 [id number]  Subjective:  Telephone call to patient for initial health coach assessment.  Patient reports she is doing fine. Patient receptive to health coach call.     Social: Patient lives with her female partner which she states has been with her for years.  She expressed some disappointment in that he was not there for her the way she thought he should during her recent stroke.  However, patient states she is over that now.  Patient has her own car and drives.  Patient reports she works part time at [employer] but has not been able to go back due to her health. She reports she is anxious to get back to work. Patient also reports she has one son.   COPD: Patient reports she is doing well with her COPD right now with her control inhalers.  She states she does get winded with activity and uses rest and nebulizer as needed.  Patient continues to smoke but wants to quit.  Currently, smoking about a half a pack per day.  Patient reports she uses vapor cigarettes to help with her smoking cessation.  Patient wants to try gum when she is able to afford. Patient reports she is not familiar with COPD Action Plan.   Stroke: Patient reports she is doing well from her stroke.  Patient reports she has joined the gym and is walking now and trying to eat better.   Patient shares she has an occupational therapy referral.  Patient reports that her blood pressure sometimes is up and down but does not have her own cuff to check at home.    Depression: Patient denies depression now.    Objective:   Current Medications:  Current Outpatient Prescriptions  Medication Sig Dispense Refill  . ADVAIR DISKUS 100-50 MCG/DOSE AEPB INHALE 1 PUFF INTO THE LUNGS 2 (TWO) TIMES DAILY. 60 each 0  . albuterol (PROVENTIL) (2.5 MG/3ML) 0.083% nebulizer solution Take 3 mLs (2.5 mg total) by nebulization every 6 (six) hours as  needed for wheezing. 75 mL 12  . amLODipine (NORVASC) 10 MG tablet Take 10 mg by mouth at bedtime.  12  . aspirin 325 MG tablet Take 1 tablet (325 mg total) by mouth daily.    Marland Kitchen atorvastatin (LIPITOR) 20 MG tablet Take 1 tablet (20 mg total) by mouth daily at 6 PM. 30 tablet 1  . hydrALAZINE (APRESOLINE) 100 MG tablet TAKE 1 TABLET (100 MG TOTAL) BY MOUTH 3 (THREE) TIMES DAILY. 90 tablet 1  . labetalol (NORMODYNE) 200 MG tablet Take 200 mg by mouth 2 (two) times daily.  12  . METHADONE HCL PO Take 44 mg by mouth daily.     . Multiple Vitamin (MULTIVITAMIN WITH MINERALS) TABS tablet Take 1 tablet by mouth daily.    . nitroGLYCERIN (NITROSTAT) 0.4 MG SL tablet Place 0.4 mg under the tongue every 5 (five) minutes as needed for chest pain.    . OXYGEN Inhale into the lungs. At night    . PROAIR HFA 108 (90 BASE) MCG/ACT inhaler USE 2 PUFFS BY MOUTH EVERY 6 HOURS AS NEEDED FOR SHORTNESS OF BREATH 8.5 Inhaler 0  . SPIRIVA HANDIHALER 18 MCG inhalation capsule INHALE 1 CAPSULE VIA HANDIHALER ONCE DAILY AT THE SAME TIME EVERY DAY 30 capsule 1   No current facility-administered medications for this visit.    Functional Status:  In your present state of health, do you  have any difficulty performing the following activities: 10/21/2014 10/10/2014  Hearing? N N  Vision? N N  Difficulty concentrating or making decisions? N N  Walking or climbing stairs? N N  Dressing or bathing? N N  Doing errands, shopping? N N  Preparing Food and eating ? N N  Using the Toilet? N N  In the past six months, have you accidently leaked urine? N N  Do you have problems with loss of bowel control? N N  Managing your Medications? N N  Managing your Finances? - N  Housekeeping or managing your Housekeeping? N N    Fall/Depression Screening: PHQ 2/9 Scores 10/21/2014 10/03/2014 10/03/2014 09/26/2014  PHQ - 2 Score 0 2 0 0  PHQ- 9 Score - 6 - -    Assessment: Patient will benefit from health coach calls for disease  management.    Plan:    [facility] CM Care Plan Problem One        Most Recent Value   Care Plan Problem One  Knowledge deficit: Stroke   Role Documenting the Problem One  [place] for Problem One  Active   THN CM Short Term Goal #1 (0-30 days)  Patient will identify stroke as urgent 911 care needed within 30 days.    THN CM Short Term Goal #1 Start Date  09/26/14   Interventions for Short Term Goal #1  Discussed with patient signs and symptoms of stroke   THN CM Short Term Goal #2 (0-30 days)  Patient will be able to identify stroke risk behaviors within 30 days   THN CM Short Term Goal #2 Start Date  10/21/14   Interventions for Short Term Goal #2  RN Health Coach discussed stroke risk factors.     THN CM Short Term Goal #3 (0-30 days)  Patient will improve with smoking cessation progress within 30 days.    THN CM Short Term Goal #3 Start Date  10/21/14   Interventions for Short Tern Goal #3  Discussed with patient methods to decrease smoking.      THN CM Care Plan Problem Two        Most Recent Value   Care Plan for Problem Two  Not Active    [facility] CM Care Plan Problem Three        Most Recent Value   Care Plan Problem Three  COPD knowledge deficit   Role Documenting the Problem Three  Health Coach   Care Plan for Problem Three  Active   THN Long Term Goal (31-90) days  Patient will be able to explain COPD zones on action plan within the next 90 days.    THN Long Term Goal Start Date  10/21/14   Interventions for Problem Three Long Term Goal  Health Coach to send COPD information to patient that includes action plan.    THN CM Short Term Goal #1 (0-30 days)  Patient will be able to describe COPD green zone within 30 days.     THN CM Short Term Goal #1 Start Date  10/21/14   Interventions for Short Term Goal #1  Discussed with patient green zone on action plan.  Action plan sent to patient    THN CM Short Term Goal #2 (0-30 days)  Patient will be able to describe techniques  of controlling breath during episodes of shortness breath within 30 days.    THN CM Short Term Goal #2 Start Date  10/21/14   Interventions  for Short Term Goal #2  Discussed with patient pursed lipped breath. Discussed use of rescue inhaler.  Health Coach encouraged patient to use frequent rest periods to conserve energy.       RN Health Coach will do social work referral for smoking cessation resources and assistance with obtaining a blood pressure cuff.   RN Health Coach will provide ongoing education for patient on COPD through phone calls and sending printed information to patient for further discussion.  RN Health Coach will send welcome packet with consent to patient as well as printed information on COPD.  RN Health Coach will send initial barriers letter, assessment, and care plan to primary care physician.  RN Health Coach will contact patient within one month and patient agrees to next contact.   Jone Baseman, RN, MSN [place] [phone]

## 2014-10-21 NOTE — Patient Outreach (Signed)
[place] [facility]) Care Management  10/21/2014  Effy Supernaw [birth year] [id number]  Telephone call to patient for initial health coach call.  Patient requests that health coach call her back. Advised that I would call her back later today. Patient agrees.  Plan: Health Coach will attempt patient later.    Jone Baseman, RN, MSN [place] [phone]

## 2014-10-22 ENCOUNTER — Ambulatory Visit: Payer: Medicare Other | Attending: Internal Medicine | Admitting: Physical Therapy

## 2014-10-22 DIAGNOSIS — R269 Unspecified abnormalities of gait and mobility: Secondary | ICD-10-CM | POA: Insufficient documentation

## 2014-10-22 NOTE — Therapy (Signed)
[facility] [address] [place], [place], [address] Phone: [phone]   Fax:  [phone]  Physical Therapy Evaluation  Patient Details  Name: Karla Nelson MRN: [id number] Date of Birth: 1966/01/30 Referring Provider:  Nolene Ebbs, MD  Encounter Date: 10/22/2014      PT End of Session - 10/22/14 1049    Visit Number 1   Number of Visits 1  Eval only-no further PT needed   PT Start Time 1019   PT Stop Time 1042   PT Time Calculation (min) 23 min   Activity Tolerance Patient tolerated treatment well   Behavior During Therapy [facility] for tasks assessed/performed      Past Medical History  Diagnosis Date  . HTN (hypertension)     Has normal renal arteries.  . Cleft palate   . NSTEMI (non-ST elevated myocardial infarction) 09-25-2010  . DYSPNEA ON EXERTION 03/08/2007  . ELECTROCARDIOGRAM, ABNORMAL 02/13/2010  . CAD (coronary artery disease)     NSTEMI with cath in August 2012 with nonobstructive disease, 50% ostial D1 and 70% distal LCX and PL disease. Normal EF. Has diastolic dysfunction with elevated EDP  . Tobacco abuse disorder   . COPD (chronic obstructive pulmonary disease)   . Hyperlipidemia   . Asthma   . Stroke     Past Surgical History  Procedure Laterality Date  . Cesarean section    . Cardiac catheterization  Aug 2012    Moderate nonobstructive multivessel CAD with an EF of 70 to 75%  . Coronary angioplasty with stent placement      There were no vitals filed for this visit.  Visit Diagnosis:  Abnormality of gait      Subjective Assessment - 10/22/14 1023    Subjective Pt is a 49 year old female who had CVA 08/31/14, with R sided weakness.  She feels that her weakness is resolving.  She is a Scientist, water quality at [employer], and she is ready to start back to work.  She reports occasional R foot drag with gait, but no falls.  She has joined a Engineer, agricultural last week.   Patient Stated Goals Pt wants  to pick up speed with walking.   Currently in Pain? No/denies            [facility] PT Assessment - 10/22/14 1027    Assessment   Medical Diagnosis CVA   Onset Date/Surgical Date 08/31/14   Balance Screen   Has the patient fallen in the past 6 months No   Has the patient had a decrease in activity level because of a fear of falling?  No   Is the patient reluctant to leave their home because of a fear of falling?  No   Home Environment   Living Environment Private residence   Living Arrangements Spouse/significant other   Type of [place] to enter   Entrance Stairs-Number of Steps 3   [place] One level   Prior Function   Level of Independence Independent with community mobility without device;Independent with household mobility without device   Vocation Full time employment  Works at [employer] as Scientist, water quality   Leisure has recently joined gym   Observation/Other Assessments   Focus on Therapeutic Outcomes (FOTO)  Functional Intake score 62; Stroke IS mobility scale:  80.6%    ROM / Strength   AROM / PROM / Strength Strength   Strength   Strength Assessment Site Hip;Knee;Ankle  Right/Left Hip Right;Left   Right Hip Flexion 4+/5   Left Hip Flexion 4+/5   Right/Left Knee Right;Left   Right Knee Flexion 4+/5   Right Knee Extension 5/5   Left Knee Flexion 4+/5   Left Knee Extension 5/5   Right/Left Ankle Right;Left   Right Ankle Dorsiflexion 4/5   Left Ankle Dorsiflexion 4+/5   Transfers   Transfers Sit to Stand;Stand to Sit   Sit to Stand 7: Independent   Stand to Sit 7: Independent   Ambulation/Gait   Ambulation/Gait Yes   Ambulation/Gait Assistance 7: Independent   Ambulation Distance (Feet) 230 Feet   Assistive device None   Gait Pattern Step-through pattern   Ambulation Surface Level;Indoor   Gait velocity 7.84 sec = 4.18 ft/sec   Stairs Yes   Stairs Assistance 6: Modified independent (Device/Increase time)    Stair Management Technique Two rails;Alternating pattern   Number of Stairs 4   Height of Stairs 6   Standardized Balance Assessment   Standardized Balance Assessment Dynamic Gait Index;Timed Up and Go Test   Dynamic Gait Index   Level Surface Normal   Change in Gait Speed Normal   Gait with Horizontal Head Turns Normal   Gait with Vertical Head Turns Normal   Gait and Pivot Turn Normal   Step Over Obstacle Normal   Step Around Obstacles Normal   Steps Mild Impairment   Total Score 23   Timed Up and Go Test   Normal TUG (seconds) 8.12                                       Plan - 11-19-14 1051    Clinical Impression Statement Pt is a 49 year old female who presents to OPPT status post CVA in July 2016.  She had R sided weakness, but feels that is resolving.  She has had home health PT and she feels strong and independent; she feels that she does not need OP PT, but she agrees to complete eval.  She has recently joined fitness center and hopes to return to work soon.  Pt appears to be [facility] for strength, with no gross deficits R vs L and she is [facility] for gait tasks.  She does not appear to be at risk for falls and does not appear to have additional PT needs at this tme.   PT Frequency --  Eval only   Recommended Other Services No further PT services recommended   Consulted and Agree with Plan of Care Patient          G-Codes - November 19, 2014 1054    Functional Assessment Tool Used DGI 23/24; gait velocity 4.18 ft/sec   Functional Limitation Mobility: Walking and moving around   Mobility: Walking and Moving Around Current Status [phone]) At least 1 percent but less than 20 percent impaired, limited or restricted   Mobility: Walking and Moving Around Goal Status [phone]) At least 1 percent but less than 20 percent impaired, limited or restricted   Mobility: Walking and Moving Around Discharge Status [phone]) At least 1 percent but less than 20 percent impaired,  limited or restricted       Problem List Patient Active Problem List   Diagnosis Date Noted  . Acute ischemic stroke 09/01/2014  . Stroke 09/01/2014  . Right sided weakness   . Smoker 03/15/2014  . History of cocaine use 03/15/2014  . Asthma,  moderate persistent 07/26/2013  . 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 Acute sinusitis 04/11/2013  . NSTEMI (non-ST elevated myocardial infarction) 06/19/2012  . Acute-on-chronic kidney injury 06/19/2012  . Community acquired pneumonia 06/16/2012  . COPD exacerbation 12/05/2011  . COPD (chronic obstructive pulmonary disease) 07/12/2011  . CAD (coronary artery disease) 01/13/2011  . Tobacco abuse 01/13/2011  . Mixed hyperlipidemia 10/13/2010  . ELECTROCARDIOGRAM, ABNORMAL 02/13/2010  . CLEFT PALATE 02/12/2010  . Essential hypertension 03/08/2007  . DYSPNEA ON EXERTION 03/08/2007    MARRIOTT,AMY W. 10/22/2014, 10:54 AM  Frazier Butt., PT  [facility] [address] [place], [place], [address] Phone: [phone]   Fax:  [phone]

## 2014-10-25 ENCOUNTER — Other Ambulatory Visit: Payer: Self-pay

## 2014-10-25 ENCOUNTER — Encounter: Payer: Self-pay | Admitting: *Deleted

## 2014-10-25 ENCOUNTER — Other Ambulatory Visit: Payer: Medicare Other | Admitting: *Deleted

## 2014-10-25 DIAGNOSIS — I10 Essential (primary) hypertension: Secondary | ICD-10-CM

## 2014-10-25 DIAGNOSIS — J449 Chronic obstructive pulmonary disease, unspecified: Secondary | ICD-10-CM

## 2014-10-25 DIAGNOSIS — I639 Cerebral infarction, unspecified: Secondary | ICD-10-CM

## 2014-10-25 NOTE — Patient Outreach (Signed)
[facility]) Care Management  10/25/2014  Karla Nelson 09/30/1965 [id number]   Telephone call to patient to advise on ability to get a blood pressure cuff. Explained to patient that she qualifies for a blood pressure cuff.  However, a community nurse will need to come out to bring it to her and show her how to use it. Patient agreeable to community nurse visit.  Spoke with patient also about suggestion from Education officer, museum for pharmacy to contact her concerning possible smoking cessation medications. Patient states that she is agreeable to pharmacy interaction as well.  Advised patient that I would do those referrals and to look forward to outreach from them. She verbalized understanding.  Patient shares that she has been given the ok to return to work for her four hour shifts at [employer].  Patient excited about going back.  Advised patient to pace herself and pay attention to her body.  She verbalized understanding.    Plan:  RN Health Coach will do referral to community nurse for blood pressure cuff delivery and teaching.   RN Health Coach will do referral to pharmacy for assist with smoking cessation medications.    Jone Baseman, RN, MSN [employer] [phone]

## 2014-10-25 NOTE — Patient Outreach (Signed)
[facility]) Care Management  10/25/2014  Karla Nelson 09-Nov-1965 [id number]   Request from Jon Billings, RN to assigned [employer] and Pharmacy, assigned Erenest Rasher, RN and Deanne Coffer, PharmD.  Thanks, Ronnell [place]. [place], [place] Assistant Phone: [phone] Fax: [phone]

## 2014-10-25 NOTE — Patient Outreach (Signed)
[name]  10/25/2014  Karla Nelson Jan 18, 1966 [id number]   CSW received a new referral on patient from patient's RNCM with [facility]) Care Management, Karla Nelson reporting that patient would benefit from social work services and resources to assist with obtaining a blood pressure monitor/cuff for home use, as well as a referral to community agencies and resources that assist with smoking cessation classes and obtaining reduced cost medications.  Patient has been diagnosed with COPD (Chronic Obstructive Pulmonary Disease); Chronic Bronchitis Type.  Patient currently lives at home with her husband, Karla Nelson, but both are on a very fixed income. CSW explained to [name] that patient is automatically approved for financial assistance through [place] [facility], to receive a blood pressure monitor/cuff for home use, as patient is an Adult Medicaid recipient.  After a financial assessment form was completed by CSW, it was determined that patient's monthly income through [place], with the [place], is below the [place] for fiscal year 2016.  CSW will complete an order form and submit to Karla Nelson, Surveyor, quantity with [facility] [facility], for approval of the blood pressure monitor/cuff.  [name] has been encouraged to make a referral to an RNCM to schedule an initial home visit with patient to deliver the blood pressure monitor/cuff, as well as educate on proper use.   Also, [name] will make a referral to pharmacy to request that they assist patient with obtaining reduced cost medications to aide in smoking cessation.  CSW will print a list of community agencies and resources that can provide information regarding smoking cessation classes, as well as financial assistance to enroll in a smoking cessation program.  The Community RNCM assigned to  patient's case can then deliver the smoking cessation resources to patient's home during their scheduled home visit.  CSW will hold off on opening patient's case, unless social work needs are identified by pharmacy and/or community RNCM. CSW will fax a barriers letter and correspondence letter to patient's Primary Care Physician, Dr. Nolene Nelson.  CSW will prescribe and print EMMI information for patient, pertaining to COPD, and mail to patient's home for her review.  Nat Christen, BSW, MSW, LCSW  Licensed Education officer, environmental Health System  Mailing [place] [place], [place] [address] Physical Address-[address], [place],  [address] Toll Free Main # [phone] Fax # [phone] Cell # [phone]  Fax # [phone]  [email or web address]

## 2014-10-31 ENCOUNTER — Other Ambulatory Visit: Payer: Self-pay | Admitting: Pharmacist

## 2014-10-31 NOTE — Patient Outreach (Signed)
[facility]) Care Management  10/31/2014  Karla Nelson [birth year] [id number]   Karla Nelson is a 49yo who was referred to [facility] for assistance with smoking cessation.  I called [name] to schedule an initial pharmacy visit.  There were no answer, and there was no voicemail box available to leave a message.  I will call [name] again next week to try to schedule an initial pharmacy visit.     [name], Pharm.D. Pharmacy Resident [place]

## 2014-11-01 ENCOUNTER — Other Ambulatory Visit: Payer: Self-pay | Admitting: Pharmacist

## 2014-11-01 ENCOUNTER — Other Ambulatory Visit: Payer: Self-pay

## 2014-11-01 NOTE — Patient Outreach (Signed)
Initial contact made with patient via telephone for community care coordination. Patient identified himself using HIPPA identifiers by giving date of birth and address.  Patient lives independently and is able to perform her ADLs, IADLs, is currently in a program for substance abuse with ADLs.  Patient attends clinic at [facility] for support, is currently on Methadone.  Patient works on at [employer] on [place] 3-4 times per week and states she is doing well on the job. Patient reports being current with her medications, especially her blood pressure medication since she states she has had a problem.  Patient reports she is compliant with medical appointments, has no problems with transportation.    Patient states she is in need of assistance with smoking cessation and a blood pressure cuff to monitor her blood pressure. Patient advised that [facility] Pharmacy Intern [name] had tried to contact patient, however, she could not be reached at another number given by patient [phone].   Plan: Home visit September 28 for initial home visit.

## 2014-11-01 NOTE — Patient Outreach (Signed)
[facility]) Care Management  11/01/2014  Lynann Corro [birth year] [id number]   Puanani Scharrer is a 49yo who was referred to [place] for assistance with smoking cessation. I made a second attempt to call Ms. Leinbach to schedule an initial pharmacy visit.  I called the phone number that the patient gave to Loni Muse, RN (phone number [phone]).  There were no answer.  I left a HIPAA compliant voice message with my call back number.  I will call Ms. Sousley again next week to try to schedule an initial pharmacy visit if I do not hear from her today.   [name], Pharm.D. Pharmacy Resident [place]

## 2014-11-04 ENCOUNTER — Ambulatory Visit: Payer: Self-pay | Admitting: Neurology

## 2014-11-06 ENCOUNTER — Other Ambulatory Visit: Payer: Self-pay

## 2014-11-06 ENCOUNTER — Other Ambulatory Visit: Payer: Self-pay | Admitting: Pharmacist

## 2014-11-06 NOTE — Patient Outreach (Signed)
[place] [facility] [facility]) Care Management  11/06/2014  Karla Nelson 08/28/1965 [id number]   Karla Nelson is a 49yo who was referred to [place] for assistance with smoking cessation. I called the patient and she reports she is still interested in smoking cessation. I set up an initial home visit for Tuesday, October 4th at 1:00 PM.     [name], Pharm.D. Pharmacy Resident [place]

## 2014-11-06 NOTE — Patient Outreach (Signed)
Initial home visit to assess need for community care coordination. [place] reviewed with patient and written consent obtained.  Patient is currently active with an outpatient Methadone Clinic in which she attends almost daily for Methadone administration.     COPD: Patient has had previous education on COPD Smoking Cessation with [place] EMMI COPD videos reviewed with patient.   Hypertension: Patient encouraged to get a blood pressure cuff.  Patient works at [employer], stated she will be able to get cuff at a lower rate at [facility] as [facility]'s cuffs are $50   Plan: Home visit in October continue community care coordination, educate on COPD and assess compliance with COPD plan

## 2014-11-07 ENCOUNTER — Encounter (HOSPITAL_COMMUNITY): Payer: Self-pay | Admitting: *Deleted

## 2014-11-12 ENCOUNTER — Other Ambulatory Visit: Payer: Self-pay | Admitting: Pharmacist

## 2014-11-12 NOTE — Patient Outreach (Signed)
Karla Nelson) Care Management  11/12/2014  Karla Nelson 02/22/1965 [id number]   S:   Karla Nelson is a 48 y.o. female who was referred to the [facility] for smoking cessation.   Age when started using tobacco on a daily basis 49yo. Number of Cigarettes per day 10. Brand smoked [place] 100s. Estimated Nicotine Content per Cigarette (mg) 0.8.  Estimated Nicotine intake per day 8 mg.   Smokes first cigarette 90 minutes after waking (wakes up at 3:30AM and has first cigarette about 5AM). Smokes 1 to 2 times per night.  Smokes most in the morning and in the evening.      Estimated Fagerstrom Score 2/10.  Most recent quit attempt when she was in the hospital in June 2016, she quit smoking for 2 weeks then resumed when she came back home to an environment where her partner smokes.   Longest time ever been tobacco free 9 months when she was pregnant. What Medications (NRT, bupropion, varenicline) used in past includes Wellbutrin (did not help).  Has never tried nicotine patches, gum, lozenge, or Chantix.    Rates IMPORTANCE of quitting tobacco on 1-10 scale of 10. Rates READINESS of quitting tobacco on 1-10 scale of 10. Rates CONFIDENCE of quitting tobacco on 1-10 scale of 5. Triggers to use tobacco include stress, aggravation.    Patient has not set a quit date at this time but reports she wants to be smoke free by the end of this year.    Assigned patient EMMI video about smoking cessation.  Planned to watch the video during visit, but patient had to get ready to go to work.     O: Current Outpatient Prescriptions on File Prior to Visit  Medication Sig Dispense Refill  . ADVAIR DISKUS 100-50 MCG/DOSE AEPB INHALE 1 PUFF INTO THE LUNGS 2 (TWO) TIMES DAILY. 60 each 0  . albuterol (PROVENTIL) (2.5 MG/3ML) 0.083% nebulizer solution Take 3 mLs (2.5 mg total) by nebulization every 6 (six) hours as needed for wheezing. 75 mL 12  .  amLODipine (NORVASC) 10 MG tablet Take 10 mg by mouth at bedtime.  12  . aspirin 325 MG tablet Take 1 tablet (325 mg total) by mouth daily.    Marland Kitchen atorvastatin (LIPITOR) 20 MG tablet Take 1 tablet (20 mg total) by mouth daily at 6 PM. 30 tablet 1  . hydrALAZINE (APRESOLINE) 100 MG tablet TAKE 1 TABLET (100 MG TOTAL) BY MOUTH 3 (THREE) TIMES DAILY. 90 tablet 1  . labetalol (NORMODYNE) 300 MG tablet Take 300 mg by mouth 2 (two) times daily.    Marland Kitchen METHADONE HCL PO Take 40 mg by mouth daily.     . Multiple Vitamin (MULTIVITAMIN WITH MINERALS) TABS tablet Take 1 tablet by mouth daily.    . nitroGLYCERIN (NITROSTAT) 0.4 MG SL tablet Place 0.4 mg under the tongue every 5 (five) minutes as needed for chest pain.    . Omega-3 Fatty Acids (FISH OIL PO) Take 1 capsule by mouth every morning.    . OXYGEN Inhale into the lungs. At night    . PROAIR HFA 108 (90 BASE) MCG/ACT inhaler USE 2 PUFFS BY MOUTH EVERY 6 HOURS AS NEEDED FOR SHORTNESS OF BREATH 8.5 Inhaler 0  . SPIRIVA HANDIHALER 18 MCG inhalation capsule INHALE 1 CAPSULE VIA HANDIHALER ONCE DAILY AT THE SAME TIME EVERY DAY 30 capsule 1  . pantoprazole (PROTONIX) 40 MG tablet Take 40 mg by mouth daily.  12   No  current facility-administered medications on file prior to visit.    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  Barriers to quitting including living in environment with second hand smoke exposure.    Recommended nicotine replacement treatment with 14 mg nicotine patches and 2 mg gum or lozenge when patient is ready to quit. Patient counseled on purpose, proper use, and potential adverse effects, including rotation of patch site, taking patch off for sleep if patient experiences insomnia or vivid dreams, and method of "park and chew" for nicotine gum.    Written information provided. Provided information on 1 800-QUIT NOW support program.  Patient  to watch EMMI video in the next week, and think about setting a quit date.  F/U phone call in one week.       [name], Pharm.D. Pharmacy Resident [place] [phone]

## 2014-11-13 ENCOUNTER — Other Ambulatory Visit: Payer: Self-pay | Admitting: Gastroenterology

## 2014-11-13 NOTE — Addendum Note (Signed)
Addended by: Amyiah Gaba on: 11/13/2014 03:16 PM   Modules accepted: Orders  

## 2014-11-18 ENCOUNTER — Encounter (HOSPITAL_COMMUNITY): Payer: Self-pay | Admitting: Anesthesiology

## 2014-11-18 ENCOUNTER — Ambulatory Visit (HOSPITAL_COMMUNITY)
Admission: RE | Admit: 2014-11-18 | Discharge: 2014-11-18 | Disposition: A | Discharge status: Home / Self Care | Payer: Medicare Other | Source: Ambulatory Visit | Attending: Gastroenterology | Admitting: Gastroenterology

## 2014-11-18 ENCOUNTER — Encounter (HOSPITAL_COMMUNITY)
Admission: RE | Disposition: A | Discharge status: Home / Self Care | Payer: Self-pay | Source: Ambulatory Visit | Attending: Gastroenterology

## 2014-11-18 ENCOUNTER — Ambulatory Visit: Payer: Medicare Other

## 2014-11-18 HISTORY — DX: Dependence on supplemental oxygen: Z99.81

## 2014-11-18 SURGERY — CANCELLED PROCEDURE

## 2014-11-18 SURGICAL SUPPLY — 21 items

## 2014-11-18 NOTE — Anesthesia Preprocedure Evaluation (Deleted)
Anesthesia Evaluation  Patient identified by MRN, date of birth, ID band Patient awake    Reviewed: Allergy & Precautions, H&P , NPO status , Patient's Chart, lab work & pertinent test results, reviewed documented beta blocker date and time   Airway        Dental no notable dental hx.    Pulmonary asthma , COPD,  COPD inhaler and oxygen dependent, Current Smoker,    Pulmonary exam normal        Cardiovascular hypertension, Pt. on medications and Pt. on home beta blockers + CAD and + Past MI       Neuro/Psych CVA negative psych ROS   GI/Hepatic negative GI ROS, Neg liver ROS,   Endo/Other  negative endocrine ROS  Renal/GU Renal InsufficiencyRenal disease  negative genitourinary   Musculoskeletal   Abdominal   Peds  Hematology negative hematology ROS (+)   Anesthesia Other Findings   Reproductive/Obstetrics negative OB ROS                             Anesthesia Physical Anesthesia Plan  ASA: III  Anesthesia Plan: MAC   Post-op Pain Management:    Induction: Intravenous  Airway Management Planned: Simple Face Mask  Additional Equipment:   Intra-op Plan:   Post-operative Plan:   Informed Consent: I have reviewed the patients History and Physical, chart, labs and discussed the procedure including the risks, benefits and alternatives for the proposed anesthesia with the patient or authorized representative who has indicated his/her understanding and acceptance.   Dental advisory given  Plan Discussed with: CRNA  Anesthesia Plan Comments:         Anesthesia Quick Evaluation

## 2014-11-18 NOTE — Progress Notes (Signed)
Patient seen prior to the procedure and she says she drank most of the prep and did throw some of it up but did not have a bowel movement until this morning and her stools are still solid and she is on methadone at home and I'll offer her to take more prep and come back tomorrow versus picking a different day in a month or 2 and that's what she would prefer and we will probably use the new  anti-narcotic pill as well as a different prep and based on my evaluation today she could be done in our office with propofol in the future

## 2014-11-19 ENCOUNTER — Other Ambulatory Visit: Payer: Self-pay

## 2014-11-21 ENCOUNTER — Other Ambulatory Visit: Payer: Self-pay | Admitting: Pharmacist

## 2014-11-21 NOTE — Patient Outreach (Signed)
[facility] [facility]) Care Management  11/21/2014  [name] May 26, 1965 [id number]  Karla Nelson is a 49yo female referred to [place] for assistance with smoking cessation.  I called patient to follow up after my home visit last week to see if patient had any questions about EMMI video and to see if patient had thought about a quit date.  There was no answer, and I had to leave a HIPAA compliant voicemail.  I will plan to call patient in one week if patient does not return my phone call.    [name], Pharm.D. Pharmacy Resident [place] [phone]

## 2014-11-28 ENCOUNTER — Other Ambulatory Visit: Payer: Self-pay | Admitting: Pharmacist

## 2014-11-28 NOTE — Patient Outreach (Signed)
[place] [facility]) Care Management  11/28/2014  [name] February 06, 1966 [id number]   Karla Nelson is a 49yo female referred to [facility] for assistance with smoking cessation. I called patient to follow up after my home visit to see if patient had any questions about EMMI video and to see if patient had thought about a quit date. Patient states she has not had time to watch the EMMI video yet.  She does report she is still interested in quitting smoking by the end of the year, but has not set a specific quit date yet.  She said she has been looking for coupons for the nicotine gum.  I reminded patient that 1800QUITNOW would be able to help provide nicotine replacement products.  Patient states she forgot to call them but will plan on calling them today.  Patient has been counseled on purpose, proper use, and potential adverse effects, including rotation of patch site, taking patch off for sleep if patient experiences insomnia or vivid dreams, and method of "park and chew" for nicotine gum.  Encouraged patient to watch EMMI video about smoking cessation.  I will follow up with patient in one month.     [name], Pharm.D. Pharmacy Resident [place] [phone]

## 2014-12-04 ENCOUNTER — Encounter: Payer: Self-pay | Admitting: Neurology

## 2014-12-04 ENCOUNTER — Ambulatory Visit (INDEPENDENT_AMBULATORY_CARE_PROVIDER_SITE_OTHER): Payer: Medicare Other | Admitting: Neurology

## 2014-12-04 VITALS — BP 144/73 | HR 71 | Ht 65.0 in | Wt 161.4 lb

## 2014-12-04 DIAGNOSIS — I639 Cerebral infarction, unspecified: Secondary | ICD-10-CM

## 2014-12-04 DIAGNOSIS — I6381 Other cerebral infarction due to occlusion or stenosis of small artery: Secondary | ICD-10-CM

## 2014-12-04 NOTE — Progress Notes (Signed)
[facility] [address] [place]. [place] [address] [phone]       OFFICE FOLLOW-UP NOTE  [name] Date of Birth:  1965/07/09 Medical Record Number:  [id number]   HPI: Ms Karla Nelson is a 41 year Caucasian lady who seen today for the first office follow-up visit following hospital admission for stroke in July 2016.Karla Nelson is a 49 y.o. female hx of HTN, NSTEMI, HLD, and cocaine usage presenting with 3 day history of right sided weakness. Notes symptoms started after an argument with her mother in law. Started in her RUE and now also involves RLE. Denies any sensory deficits. No visual or speech deficits.  MRI brain imaging  Personally reviewed, showed acute infarct in left posterior limb of internal capsule.  Date last known well: 08/29/2014 Time last known well: unclear tPA Given: no, outside tPA window Modified Rankin: Rankin Score=0. LDL cholesterol was elevated at 113 mg percent and hemoglobin A1c was 6.9. Transthoracic echo showed normal ejection fraction and carotid ultrasound showed no significant extra-axial stenosis. 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 Patient was counseled to quit smoking and drugs and started on aspirin. She states she's done well. Her right-sided weakness has improved only intermittently she will drag her right leg. She's been suffering from a cold for the last few days and plans to see her primary care physician for that soon. She continues to smoke and has not yet quit smoking but wants to discuss smoking cessation options with her primary physician. She is back to work and works part-time but has to work at the [employer]. Blood pressure is well controlled and today it is 144/83. She is tolerating Lipitor without myalgias or arthralgias.  ROS:   14 system review of systems is positive for shortness of breath, wheezing, cough , dragging of right leg and all other systems negative  PMH:  Past Medical History    Diagnosis Date  . HTN (hypertension)     Has normal renal arteries.  . Cleft palate     special denture covers defect like a C, sleeps with device in place  . NSTEMI (non-ST elevated myocardial infarction) ([facility]) 09-25-2010  . DYSPNEA ON EXERTION 03/08/2007  . ELECTROCARDIOGRAM, ABNORMAL 02/13/2010  . CAD (coronary artery disease)     NSTEMI with cath in August 2012 with nonobstructive disease, 50% ostial D1 and 70% distal LCX and PL disease. Normal EF. Has diastolic dysfunction with elevated EDP  . Tobacco abuse disorder   . Hyperlipidemia   . Asthma   . Stroke [facility])     july 2016, Dr Donnella Sham  . COPD (chronic obstructive pulmonary disease) ([facility])     o2 and bedtime   . History of oxygen administration     bedtime    Social History:  Social History   Social History  . Marital Status: Single    Spouse Name: N/A  . Number of Children: N/A  . Years of Education: N/A   Occupational History  . Not on file.   Social History Main Topics  . Smoking status: Current Every Day Smoker -- 0.50 packs/day for 31 years    Types: Cigarettes  . Smokeless tobacco: Never Used  . Alcohol Use: No  . Drug Use: No     Comment: former drug use  . Sexual Activity: Yes   Other Topics Concern  . Not on file   Social History Narrative    Medications:   Current Outpatient Prescriptions on File Prior to Visit  Medication Sig Dispense Refill  . ADVAIR DISKUS 100-50 MCG/DOSE AEPB INHALE 1 PUFF INTO THE LUNGS 2 (TWO) TIMES DAILY. 60 each 0  . albuterol (PROVENTIL) (2.5 MG/3ML) 0.083% nebulizer solution Take 3 mLs (2.5 mg total) by nebulization every 6 (six) hours as needed for wheezing. 75 mL 12  . amLODipine (NORVASC) 10 MG tablet Take 10 mg by mouth at bedtime.  12  . aspirin 325 MG tablet Take 1 tablet (325 mg total) by mouth daily.    Marland Kitchen atorvastatin (LIPITOR) 20 MG tablet Take 1 tablet (20 mg total) by mouth daily at 6 PM. 30 tablet 1  . hydrALAZINE (APRESOLINE) 100 MG tablet TAKE 1 TABLET  (100 MG TOTAL) BY MOUTH 3 (THREE) TIMES DAILY. 90 tablet 1  . labetalol (NORMODYNE) 300 MG tablet Take 300 mg by mouth 2 (two) times daily.    Marland Kitchen METHADONE HCL PO Take 40 mg by mouth daily.     . Multiple Vitamin (MULTIVITAMIN WITH MINERALS) TABS tablet Take 1 tablet by mouth daily.    . nitroGLYCERIN (NITROSTAT) 0.4 MG SL tablet Place 0.4 mg under the tongue every 5 (five) minutes as needed for chest pain.    . Omega-3 Fatty Acids (FISH OIL PO) Take 1 capsule by mouth every morning.    . OXYGEN Inhale into the lungs. At night    . PROAIR HFA 108 (90 BASE) MCG/ACT inhaler USE 2 PUFFS BY MOUTH EVERY 6 HOURS AS NEEDED FOR SHORTNESS OF BREATH 8.5 Inhaler 0  . SPIRIVA HANDIHALER 18 MCG inhalation capsule INHALE 1 CAPSULE VIA HANDIHALER ONCE DAILY AT THE SAME TIME EVERY DAY 30 capsule 1   No current facility-administered medications on file prior to visit.    Allergies:  No Known Allergies  Physical Exam General: well developed, well nourished, seated, in no evident distress Head: head normocephalic and atraumatic.  Neck: supple with no carotid or supraclavicular bruits Cardiovascular: regular rate and rhythm, no murmurs Musculoskeletal: no deformity Skin:  no rash/petichiae Vascular:  Normal pulses all extremities Respiratory : Scattered rhonchi bilaterally Filed Vitals:   12/04/14 0935  BP: 144/73  Pulse: 71   Neurologic Exam Mental Status: Awake and fully alert. Oriented to place and time. Recent and remote memory intact. Attention span, concentration and fund of knowledge appropriate. Mood and affect appropriate.  Cranial Nerves: Fundoscopic exam reveals sharp disc margins. Pupils equal, briskly reactive to light. Extraocular movements full without nystagmus. Visual fields full to confrontation. Hearing intact. Facial sensation intact. Face, tongue, palate moves normally and symmetrically.  Motor: Normal bulk and tone. Normal strength in all tested extremity muscles. Diminished fine  finger movements on the right. Orbits left over right upper extremity. Sensory.: intact to touch ,pinprick .position and vibratory sensation.  Coordination: Rapid alternating movements normal in all extremities. Finger-to-nose and heel-to-shin performed accurately bilaterally. Gait and Station: Arises from chair without difficulty. Stance is normal. Gait demonstrates normal stride length and balance . Able to heel, toe and tandem walk without difficulty.  Reflexes: 1+ and symmetric. Toes downgoing.   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    PLAN: I had a long d/w patient about her recent stroke, risk for recurrent stroke/TIAs, personally independently reviewed imaging studies and stroke evaluation results and answered questions.Continue aspirin 325 mg daily  for secondary stroke prevention and maintain strict control of hypertension  with blood pressure goal below 130/90, diabetes with hemoglobin A1c goal below 6.5% and lipids with LDL cholesterol goal below 100 mg/dL. I also counseled her to strongly quit smoking and cocaine use.. I also advised the patient to eat a healthy diet with plenty of whole grains, cereals, fruits and vegetables, exercise regularly and maintain ideal body weight .I also advised her to see her primary physician Dr. Jackson Latino for treatment for her cold, bronchitis and help with smoking cessation.Greater than 50% of time during this 25 minute visit was spent on counseling,explanation of diagnosis, planning of further management, discussion with patient and family and coordination of care  Followup in the future with me in  6 months or call earlier if necessary. Antony Contras, MD Note: This document was prepared with digital dictation and possible smart phrase technology. Any transcriptional errors that result from  this process are unintentional

## 2014-12-04 NOTE — Patient Instructions (Signed)
I had a long d/w patient about her recent stroke, risk for recurrent stroke/TIAs, personally independently reviewed imaging studies and stroke evaluation results and answered questions.Continue aspirin 325 mg daily  for secondary stroke prevention and maintain strict control of hypertension with blood pressure goal below 130/90, diabetes with hemoglobin A1c goal below 6.5% and lipids with LDL cholesterol goal below 100 mg/dL. I also counseled her to strongly quit smoking. I also advised the patient to eat a healthy diet with plenty of whole grains, cereals, fruits and vegetables, exercise regularly and maintain ideal body weight .I also advised her to see her primary physician Dr. Jackson Latino for treatment for her cold, bronchitis and help with smoking cessation. Followup in the future with me in  6 months or call earlier if necessary. Stroke Prevention Some medical conditions and behaviors are associated with an increased chance of having a stroke. You may prevent a stroke by making healthy choices and managing medical conditions. HOW CAN I REDUCE MY RISK OF HAVING A STROKE?   Stay physically active. Get at least 30 minutes of activity on most or all days.  Do not smoke. It may also be helpful to avoid exposure to secondhand smoke.  Limit alcohol use. Moderate alcohol use is considered to be:  No more than 2 drinks per day for men.  No more than 1 drink per day for nonpregnant women.  Eat healthy foods. This involves:  Eating 5 or more servings of fruits and vegetables a day.  Making dietary changes that address high blood pressure (hypertension), high cholesterol, diabetes, or obesity.  Manage your cholesterol levels.  Making food choices that are high in fiber and low in saturated fat, trans fat, and cholesterol may control cholesterol levels.  Take any prescribed medicines to control cholesterol as directed by your health care provider.  Manage your diabetes.  Controlling your  carbohydrate and sugar intake is recommended to manage diabetes.  Take any prescribed medicines to control diabetes as directed by your health care provider.  Control your hypertension.  Making food choices that are low in salt (sodium), saturated fat, trans fat, and cholesterol is recommended to manage hypertension.  Ask your health care provider if you need treatment to lower your blood pressure. Take any prescribed medicines to control hypertension as directed by your health care provider.  If you are 82-23 years of age, have your blood pressure checked every 3-5 years. If you are 4 years of age or older, have your blood pressure checked every year.  Maintain a healthy weight.  Reducing calorie intake and making food choices that are low in sodium, saturated fat, trans fat, and cholesterol are recommended to manage weight.  Stop drug abuse.  Avoid taking birth control pills.  Talk to your health care provider about the risks of taking birth control pills if you are over 73 years old, smoke, get migraines, or have ever had a blood clot.  Get evaluated for sleep disorders (sleep apnea).  Talk to your health care provider about getting a sleep evaluation if you snore a lot or have excessive sleepiness.  Take medicines only as directed by your health care provider.  For some people, aspirin or blood thinners (anticoagulants) are helpful in reducing the risk of forming abnormal blood clots that can lead to stroke. If you have the irregular heart rhythm of atrial fibrillation, you should be on a blood thinner unless there is a good reason you cannot take them.  Understand all your medicine instructions.  Make sure that other conditions (such as anemia or atherosclerosis) are addressed. SEEK IMMEDIATE MEDICAL CARE IF:   You have sudden weakness or numbness of the face, arm, or leg, especially on one side of the body.  Your face or eyelid droops to one side.  You have sudden  confusion.  You have trouble speaking (aphasia) or understanding.  You have sudden trouble seeing in one or both eyes.  You have sudden trouble walking.  You have dizziness.  You have a loss of balance or coordination.  You have a sudden, severe headache with no known cause.  You have new chest pain or an irregular heartbeat. Any of these symptoms may represent a serious problem that is an emergency. Do not wait to see if the symptoms will go away. Get medical help at once. Call your local emergency services (911 in [place] Do not drive yourself to the hospital.   This information is not intended to replace advice given to you by your health care provider. Make sure you discuss any questions you have with your health care provider.   Document Released: 03/04/2004 Document Revised: 02/15/2014 Document Reviewed: 07/28/2012 Elsevier Interactive Patient Education [employer].

## 2014-12-23 ENCOUNTER — Other Ambulatory Visit: Payer: Self-pay | Admitting: Internal Medicine

## 2014-12-25 ENCOUNTER — Other Ambulatory Visit: Payer: Self-pay | Admitting: Pharmacist

## 2014-12-25 NOTE — Patient Outreach (Signed)
[facility]) Care Management  12/25/2014  Keir Kumpf Nov 02, 1965 [id number]   Karla Nelson is a 49yo female referred to [place] for assistance with smoking cessation. I made a follow up call to patient.  Patient reports she is still smoking 10 cigarettes per day.  She does report she is still interested in quitting smoking by the end of the year (rates importance of quitting a 10 out of 10 and readiness to quit a 4 out of 10), and wants her quit date to be 02/08/15. Discussed with patient that she may benefit from reducing the number of cigarettes she smokes per day every few days leading up to her quit date.  Patient reports it may be hard for her to reduce number of cigarettes because she doesn't have anything to do during the day.  Discussed use of distraction techniques for nicotine cravings.  She reports she is motivated to quit due to diagnosis of COPD, asthma and history of stroke.  Discussed benefits of smoking cessation.  She said she quit for one day in the past month and tried using nicotine lozenges but she reports she did not tolerate them due to bad taste in mouth.  She reports she may try to use nicotine gum and was given a coupon for nicotine gum.  I reminded patient that [employer] would be able to help provide nicotine replacement products if she is interested. Patient states she has not called [employer] yet.  Recommended nicotine replacement treatment with 14 mg nicotine patches for 6 weeks then 2mg  patches for 2 weeks.  Patient counseled on purpose, proper use, and potential adverse effects, including rotation of patch site, taking patch off for sleep if patient experiences insomnia or vivid dreams.   Plan:  Patient reports she will call [employer].  Will follow up with patient in one month.    THN CM Care Plan Problem One        Most Recent Value   Care Plan Problem One  Nicotine dependence   Role Documenting the Problem One  Clinical  Pharmacist   Care Plan for Problem One  Active   THN CM Short Term Goal #1 (0-30 days)  Patient will call [employer] in the next 30 days per patient report.    THN CM Short Term Goal #1 Start Date  12/25/14   Interventions for Short Term Goal #1  Discussed use of nicotine patches in smoking cessation. Counseled patient on purpose, proper use and adverse effects of patches. Patient is interested in using patches to quit. Provided her with phone number of [employer] to call to get nicotine patches for free.      [name], Pharm.D. Pharmacy Resident [place] [phone]

## 2014-12-26 ENCOUNTER — Telehealth: Payer: Self-pay | Admitting: Internal Medicine

## 2014-12-26 MED ORDER — TIOTROPIUM BROMIDE MONOHYDRATE 18 MCG IN CAPS: ORAL_CAPSULE | RESPIRATORY_TRACT | Status: DC

## 2014-12-26 NOTE — Telephone Encounter (Signed)
Called spoke with pt. Aware RX sent in for spiriva. Nothing further needed

## 2014-12-30 ENCOUNTER — Ambulatory Visit (INDEPENDENT_AMBULATORY_CARE_PROVIDER_SITE_OTHER): Payer: Medicare Other | Admitting: Internal Medicine

## 2014-12-30 ENCOUNTER — Encounter: Payer: Self-pay | Admitting: Internal Medicine

## 2014-12-30 VITALS — BP 188/98 | HR 83 | Ht 65.0 in | Wt 160.0 lb

## 2014-12-30 DIAGNOSIS — J449 Chronic obstructive pulmonary disease, unspecified: Secondary | ICD-10-CM | POA: Insufficient documentation

## 2014-12-30 DIAGNOSIS — J4540 Moderate persistent asthma, uncomplicated: Secondary | ICD-10-CM

## 2014-12-30 DIAGNOSIS — Z720 Tobacco use: Secondary | ICD-10-CM

## 2014-12-30 DIAGNOSIS — I639 Cerebral infarction, unspecified: Secondary | ICD-10-CM | POA: Diagnosis not present

## 2014-12-30 DIAGNOSIS — J018 Other acute sinusitis: Secondary | ICD-10-CM | POA: Diagnosis not present

## 2014-12-30 DIAGNOSIS — F17200 Nicotine dependence, unspecified, uncomplicated: Secondary | ICD-10-CM

## 2014-12-30 DIAGNOSIS — IMO0001 Reserved for inherently not codable concepts without codable children: Secondary | ICD-10-CM | POA: Insufficient documentation

## 2014-12-30 MED ORDER — CEPHALEXIN 500 MG PO CAPS
500.0000 mg | ORAL_CAPSULE | Freq: Three times a day (TID) | ORAL | Status: DC
Start: 2014-12-30 — End: 2015-03-26

## 2014-12-30 NOTE — Patient Instructions (Addendum)
ICD-9-CM ICD-10-CM   1. Asthma, moderate persistent, uncomplicated 123456 123456   2. Other acute sinusitis 461.8 J01.80   3. Smoking 305.1 Z72.0     Stable disease. Glad uptodate with flu shot  Plan Monitor for flare up Continue spiriva + advair schedule Use albuterol as needed Quit smoking  Fo sinus - cephalexin 500mg  tid x 5 days - use saline nasal gel  For smoking  - buy OTC nicotine patch and try to quit smoking  Followup 6 months or sooner if needed [place] if not feeling well

## 2014-12-30 NOTE — Progress Notes (Signed)
Subjective:     Patient ID: Karla Nelson, female   DOB: 1965-12-01, 49 y.o.   MRN: [id number]  HPI   OV 12/30/2014  Chief Complaint  Patient presents with  . Follow-up    Pt c/o sinus congesiton. Pt states her breathing is doing well. Pt denies cough, CP/tightness, signficant SOB. Pt states she had a CVA in July 2016.    Follow-up asthma/COPD and a smoker  Routine follow-up. Continues to smoke. She says rest her symptoms are stable without any flareup.) Reviewed doing the discharge instruction sheet complaint all of a sudden about green sinus drainage over the last few days and was demanding antibiotics and advised for the same. She says she is struggling to quit but is compliant with her inhalers. She is interested in trying nicotine patch for quitting smoking. Last chest x-ray was in July 2016 at the time of the stroke. I personally visualized this and it is clear  Past medical history in July 2160 and apparently had right-sided weakness and was called a stroke. She is now recovered.  Social History   Social History  . Marital Status: Single    Spouse Name: N/A  . Number of Children: N/A  . Years of Education: N/A   Occupational History  . Not on file.   Social History Main Topics  . Smoking status: Current Every Day Smoker -- 0.50 packs/day for 31 years    Types: Cigarettes  . Smokeless tobacco: Never Used  . Alcohol Use: No  . Drug Use: No     Comment: former drug use  . Sexual Activity: Yes   Other Topics Concern  . Not on file   Social History Narrative      Current outpatient prescriptions:  .  ADVAIR DISKUS 100-50 MCG/DOSE AEPB, INHALE 1 PUFF INTO THE LUNGS 2 (TWO) TIMES DAILY., Disp: 60 each, Rfl: 0 .  albuterol (PROVENTIL) (2.5 MG/3ML) 0.083% nebulizer solution, Take 3 mLs (2.5 mg total) by nebulization every 6 (six) hours as needed for wheezing., Disp: 75 mL, Rfl: 12 .  amLODipine (NORVASC) 10 MG tablet, Take 10 mg by mouth at bedtime., Disp: , Rfl:  12 .  aspirin 325 MG tablet, Take 1 tablet (325 mg total) by mouth daily., Disp: , Rfl:  .  atorvastatin (LIPITOR) 20 MG tablet, Take 1 tablet (20 mg total) by mouth daily at 6 PM., Disp: 30 tablet, Rfl: 1 .  hydrALAZINE (APRESOLINE) 100 MG tablet, TAKE 1 TABLET (100 MG TOTAL) BY MOUTH 3 (THREE) TIMES DAILY., Disp: 90 tablet, Rfl: 1 .  labetalol (NORMODYNE) 300 MG tablet, Take 300 mg by mouth 2 (two) times daily., Disp: , Rfl:  .  METHADONE HCL PO, Take 40 mg by mouth daily. , Disp: , Rfl:  .  Multiple Vitamin (MULTIVITAMIN WITH MINERALS) TABS tablet, Take 1 tablet by mouth daily., Disp: , Rfl:  .  nitroGLYCERIN (NITROSTAT) 0.4 MG SL tablet, Place 0.4 mg under the tongue every 5 (five) minutes as needed for chest pain., Disp: , Rfl:  .  Omega-3 Fatty Acids (FISH OIL PO), Take 1 capsule by mouth every morning., Disp: , Rfl:  .  OXYGEN, Inhale into the lungs. At night, Disp: , Rfl:  .  PROAIR HFA 108 (90 BASE) MCG/ACT inhaler, USE 2 PUFFS BY MOUTH EVERY 6 HOURS AS NEEDED FOR SHORTNESS OF BREATH, Disp: 8.5 Inhaler, Rfl: 0 .  tiotropium (SPIRIVA HANDIHALER) 18 MCG inhalation capsule, INHALE 1 CAPSULE VIA HANDIHALER ONCE DAILY AT THE SAME TIME  EVERY DAY, Disp: 30 capsule, Rfl: 0       Immunization History  Administered Date(s) Administered  . Influenza Split 11/04/2011  . Influenza Whole 12/10/2010  . Influenza,inj,Quad PF,36+ Mos 02/28/2014  . Pneumococcal Polysaccharide-23 09/02/2014    No Known Allergies   Review of Systems According to history of present illness    Objective:   Physical Exam  Constitutional: She is oriented to person, place, and time. She appears well-developed and well-nourished. No distress.  HENT:  Head: Normocephalic and atraumatic.  Right Ear: External ear normal.  Left Ear: External ear normal.  Mouth/Throat: Oropharynx is clear and moist. No oropharyngeal exudate.  Smells of tobacco  Eyes: Conjunctivae and EOM are normal. Pupils are equal, round, and  reactive to light. Right eye exhibits no discharge. Left eye exhibits no discharge. No scleral icterus.  Neck: Normal range of motion. Neck supple. No JVD present. No tracheal deviation present. No thyromegaly present.  Cardiovascular: Normal rate, regular rhythm, normal heart sounds and intact distal pulses.  Exam reveals no gallop and no friction rub.   No murmur heard. Pulmonary/Chest: Effort normal and breath sounds normal. No respiratory distress. She has no wheezes. She has no rales. She exhibits no tenderness.  Abdominal: Soft. Bowel sounds are normal. She exhibits no distension and no mass. There is no tenderness. There is no rebound and no guarding.  Musculoskeletal: Normal range of motion. She exhibits no edema or tenderness.  Lymphadenopathy:    She has no cervical adenopathy.  Neurological: She is alert and oriented to person, place, and time. She has normal reflexes. No cranial nerve deficit. She exhibits normal muscle tone. Coordination normal.  Skin: Skin is warm and dry. No rash noted. She is not diaphoretic. No erythema. No pallor.  Psychiatric: She has a normal mood and affect. Her behavior is normal. Judgment and thought content normal.  Vitals reviewed.  Filed Vitals:   12/30/14 1700  BP: 188/98  Pulse: 83  Height: 5\' 5"  (1.651 m)  Weight: 160 lb (72.576 kg)  SpO2: 95%        Assessment:       ICD-9-CM ICD-10-CM   1. Asthma, moderate persistent, uncomplicated 123456 123456   2. Other acute sinusitis 461.8 J01.80   3. Smoking 305.1 Z72.0        Plan:      Stable disease. Glad uptodate with flu shot  Plan Monitor for flare up Continue spiriva + advair schedule Use albuterol as needed Quit smoking  Fo sinus - cephalexin 500mg  tid x 5 days - use saline nasal gel  For smoking  - buy OTC nicotine patch and try to quit smoking  Followup 6 months or sooner if needed [place] if not feeling well  Dr. Brand Males, M.D., [facility].C.P Pulmonary and Critical  Care Medicine Staff Physician [place] [facility] Pager: [phone], If no answer or between  15:00h - 7:00h: call [phone]  12/30/2014 6:26 PM

## 2015-01-27 ENCOUNTER — Other Ambulatory Visit: Payer: Self-pay | Admitting: Pharmacist

## 2015-01-27 NOTE — Patient Outreach (Signed)
[facility]) Care Management  01/27/2015  Carley Abele 29-Jun-1965 [id number]   Karla Nelson is a 49yo female referred to [facility] for assistance with smoking cessation. I made a follow up call to patient. Patient reports she has reduced number of cigarettes from 10 cigarettes per day to 2 or 3 cigarettes per day.  Patient reports she does not plan to buy any more cigarettes.  Patient reports she still plans to quit on 02/08/15.  Patient reports she is using nicotine lozenges for nicotine cravings.  Patient has not called [employer] for assistance with obtaining nicotine replacement products because she said she is using coupons to purchase nicotine lozenges.  I have provided patient with resources for support on smoking cessation and patient denies any further needs at this time.  Will close pharmacy program as patient reports she no longer needs pharmacy services.  I provided patient with information about [employer] support program.  Will alert Karla Nelson of case closure.     [name], Pharm.D. Pharmacy Resident [place] [phone]

## 2015-01-28 ENCOUNTER — Encounter: Payer: Self-pay | Admitting: Pharmacist

## 2015-01-29 ENCOUNTER — Other Ambulatory Visit: Payer: Self-pay | Admitting: Internal Medicine

## 2015-01-31 ENCOUNTER — Other Ambulatory Visit: Payer: Self-pay

## 2015-01-31 NOTE — Progress Notes (Signed)
This encounter was created in error - please disregard.  This encounter was created in error - please disregard.

## 2015-02-14 ENCOUNTER — Emergency Department (HOSPITAL_COMMUNITY): Payer: Medicare Other

## 2015-02-14 ENCOUNTER — Emergency Department (HOSPITAL_COMMUNITY)
Admission: EM | Admit: 2015-02-14 | Discharge: 2015-02-14 | Disposition: A | Discharge status: Home / Self Care | Payer: Medicare Other | Attending: Emergency Medicine | Admitting: Emergency Medicine

## 2015-02-14 ENCOUNTER — Encounter (HOSPITAL_COMMUNITY): Payer: Self-pay | Admitting: Family Medicine

## 2015-02-14 DIAGNOSIS — Z9861 Coronary angioplasty status: Secondary | ICD-10-CM | POA: Insufficient documentation

## 2015-02-14 DIAGNOSIS — Z7982 Long term (current) use of aspirin: Secondary | ICD-10-CM | POA: Insufficient documentation

## 2015-02-14 DIAGNOSIS — F17210 Nicotine dependence, cigarettes, uncomplicated: Secondary | ICD-10-CM | POA: Diagnosis not present

## 2015-02-14 DIAGNOSIS — Z792 Long term (current) use of antibiotics: Secondary | ICD-10-CM | POA: Insufficient documentation

## 2015-02-14 DIAGNOSIS — M62831 Muscle spasm of calf: Secondary | ICD-10-CM | POA: Insufficient documentation

## 2015-02-14 DIAGNOSIS — Z8673 Personal history of transient ischemic attack (TIA), and cerebral infarction without residual deficits: Secondary | ICD-10-CM | POA: Diagnosis not present

## 2015-02-14 DIAGNOSIS — I10 Essential (primary) hypertension: Secondary | ICD-10-CM | POA: Diagnosis not present

## 2015-02-14 DIAGNOSIS — R51 Headache: Secondary | ICD-10-CM | POA: Insufficient documentation

## 2015-02-14 DIAGNOSIS — I2510 Atherosclerotic heart disease of native coronary artery without angina pectoris: Secondary | ICD-10-CM | POA: Diagnosis not present

## 2015-02-14 DIAGNOSIS — I252 Old myocardial infarction: Secondary | ICD-10-CM | POA: Diagnosis not present

## 2015-02-14 DIAGNOSIS — J449 Chronic obstructive pulmonary disease, unspecified: Secondary | ICD-10-CM | POA: Diagnosis not present

## 2015-02-14 DIAGNOSIS — E785 Hyperlipidemia, unspecified: Secondary | ICD-10-CM | POA: Insufficient documentation

## 2015-02-14 DIAGNOSIS — R531 Weakness: Secondary | ICD-10-CM | POA: Diagnosis present

## 2015-02-14 DIAGNOSIS — M62838 Other muscle spasm: Secondary | ICD-10-CM

## 2015-02-14 DIAGNOSIS — Z79899 Other long term (current) drug therapy: Secondary | ICD-10-CM | POA: Insufficient documentation

## 2015-02-14 LAB — COMPREHENSIVE METABOLIC PANEL
ALBUMIN: 4.1 g/dL (ref 3.5–5.0)
ALT: 16 U/L (ref 14–54)
AST: 22 U/L (ref 15–41)
Alkaline Phosphatase: 70 U/L (ref 38–126)
Anion gap: 12 (ref 5–15)
BUN: 35 mg/dL — AB (ref 6–20)
CHLORIDE: 101 mmol/L (ref 101–111)
CO2: 26 mmol/L (ref 22–32)
CREATININE: 1.92 mg/dL — AB (ref 0.44–1.00)
Calcium: 9.9 mg/dL (ref 8.9–10.3)
GFR calc Af Amer: 34 mL/min — ABNORMAL LOW (ref >60)
GFR, EST NON AFRICAN AMERICAN: 29 mL/min — AB (ref >60)
GLUCOSE: 155 mg/dL — AB (ref 65–99)
POTASSIUM: 4.3 mmol/L (ref 3.5–5.1)
SODIUM: 139 mmol/L (ref 135–145)
Total Bilirubin: 0.4 mg/dL (ref 0.3–1.2)
Total Protein: 7 g/dL (ref 6.5–8.1)

## 2015-02-14 LAB — APTT: APTT: 34 s (ref 24–37)

## 2015-02-14 LAB — PROTIME-INR
INR: 1.01 (ref 0.00–1.49)
Prothrombin Time: 13.5 seconds (ref 11.6–15.2)

## 2015-02-14 LAB — CBG MONITORING, ED: Glucose-Capillary: 116 mg/dL — ABNORMAL HIGH (ref 65–99)

## 2015-02-14 LAB — DIFFERENTIAL
BASOS ABS: 0.1 10*3/uL (ref 0.0–0.1)
BASOS PCT: 1 %
EOS ABS: 0.2 10*3/uL (ref 0.0–0.7)
Eosinophils Relative: 3 %
Lymphocytes Relative: 22 %
Lymphs Abs: 1.9 10*3/uL (ref 0.7–4.0)
Monocytes Absolute: 0.7 10*3/uL (ref 0.1–1.0)
Monocytes Relative: 8 %
NEUTROS ABS: 5.6 10*3/uL (ref 1.7–7.7)
NEUTROS PCT: 66 %

## 2015-02-14 LAB — CBC
HEMATOCRIT: 38 % (ref 36.0–46.0)
Hemoglobin: 12.7 g/dL (ref 12.0–15.0)
MCH: 30.1 pg (ref 26.0–34.0)
MCHC: 33.4 g/dL (ref 30.0–36.0)
MCV: 90 fL (ref 78.0–100.0)
Platelets: 237 10*3/uL (ref 150–400)
RBC: 4.22 MIL/uL (ref 3.87–5.11)
RDW: 13.7 % (ref 11.5–15.5)
WBC: 8.4 10*3/uL (ref 4.0–10.5)

## 2015-02-14 LAB — I-STAT CHEM 8, ED
BUN: 38 mg/dL — AB (ref 6–20)
CALCIUM ION: 1.17 mmol/L (ref 1.12–1.23)
CHLORIDE: 99 mmol/L — AB (ref 101–111)
Creatinine, Ser: 1.8 mg/dL — ABNORMAL HIGH (ref 0.44–1.00)
GLUCOSE: 149 mg/dL — AB (ref 65–99)
HCT: 41 % (ref 36.0–46.0)
Hemoglobin: 13.9 g/dL (ref 12.0–15.0)
POTASSIUM: 3.8 mmol/L (ref 3.5–5.1)
Sodium: 139 mmol/L (ref 135–145)
TCO2: 30 mmol/L (ref 0–100)

## 2015-02-14 LAB — I-STAT TROPONIN, ED: TROPONIN I, POC: 0.01 ng/mL (ref 0.00–0.08)

## 2015-02-14 NOTE — ED Provider Notes (Signed)
CSN: [id number]     Arrival date & time 02/14/15  1810 History   First MD Initiated Contact with Patient 02/14/15 2055     Chief Complaint  Patient presents with  . Extremity Weakness     (Consider location/radiation/quality/duration/timing/severity/associated sxs/prior Treatment) Patient is a 50 y.o. female presenting with extremity weakness. The history is provided by the patient.  Extremity Weakness This is a new problem. Episode onset: 2 weeks ago. The problem occurs daily. The problem has not changed since onset.Nothing aggravates the symptoms. Nothing relieves the symptoms. She has tried nothing for the symptoms.    Past Medical History  Diagnosis Date  . HTN (hypertension)     Has normal renal arteries.  . Cleft palate     special denture covers defect like a C, sleeps with device in place  . NSTEMI (non-ST elevated myocardial infarction) ([place]) 09-25-2010  . DYSPNEA ON EXERTION 03/08/2007  . ELECTROCARDIOGRAM, ABNORMAL 02/13/2010  . CAD (coronary artery disease)     NSTEMI with cath in August 2012 with nonobstructive disease, 50% ostial D1 and 70% distal LCX and PL disease. Normal EF. Has diastolic dysfunction with elevated EDP  . Tobacco abuse disorder   . Hyperlipidemia   . Asthma   . Stroke [facility])     july 2016, Dr Donnella Sham  . COPD (chronic obstructive pulmonary disease) ([facility])     o2 and bedtime   . History of oxygen administration     bedtime   Past Surgical History  Procedure Laterality Date  . Cesarean section    . Cardiac catheterization  Aug 2012    Moderate nonobstructive multivessel CAD with an EF of 70 to 75%  . Coronary angioplasty      no further intervention last cardiac visit greater than a year   Family History  Problem Relation Age of Onset  . Diabetes    . Hypertension    . Asthma Mother   . Heart disease Mother   . Hypertension Mother   . Asthma Brother    Social History  Substance Use Topics  . Smoking status: Current Every Day Smoker --  0.50 packs/day for 31 years    Types: Cigarettes  . Smokeless tobacco: Never Used  . Alcohol Use: No   OB History    Gravida Para Term Preterm AB TAB SAB Ectopic Multiple Living            1     Review of Systems  Musculoskeletal: Positive for extremity weakness.  All other systems reviewed and are negative.     Allergies  Review of patient's allergies indicates no known allergies.  Home Medications   Prior to Admission medications   Medication Sig Start Date End Date Taking? Authorizing Provider  ADVAIR DISKUS 100-50 MCG/DOSE AEPB INHALE 1 PUFF INTO THE LUNGS 2 (TWO) TIMES DAILY. 10/08/14   Brand Males, MD  albuterol (PROVENTIL) (2.5 MG/3ML) 0.083% nebulizer solution Take 3 mLs (2.5 mg total) by nebulization every 6 (six) hours as needed for wheezing. 06/21/11   Sheryle Hail, NP  amLODipine (NORVASC) 10 MG tablet Take 10 mg by mouth at bedtime. 08/09/14   Historical Provider, MD  aspirin 325 MG tablet Take 1 tablet (325 mg total) by mouth daily. 09/02/14   Delfina Redwood, MD  atorvastatin (LIPITOR) 20 MG tablet Take 1 tablet (20 mg total) by mouth daily at 6 PM. 09/02/14   Delfina Redwood, MD  cephALEXin (KEFLEX) 500 MG capsule Take 1 capsule (500 mg  total) by mouth 3 (three) times daily. 12/30/14   Brand Males, MD  hydrALAZINE (APRESOLINE) 100 MG tablet TAKE 1 TABLET (100 MG TOTAL) BY MOUTH 3 (THREE) TIMES DAILY. 09/04/14   Burtis Junes, NP  labetalol (NORMODYNE) 300 MG tablet Take 300 mg by mouth 2 (two) times daily.    Historical Provider, MD  METHADONE HCL PO Take 40 mg by mouth daily.     Historical Provider, MD  Multiple Vitamin (MULTIVITAMIN WITH MINERALS) TABS tablet Take 1 tablet by mouth daily.    Historical Provider, MD  nitroGLYCERIN (NITROSTAT) 0.4 MG SL tablet Place 0.4 mg under the tongue every 5 (five) minutes as needed for chest pain.    Historical Provider, MD  Omega-3 Fatty Acids (FISH OIL PO) Take 1 capsule by mouth every morning.    Historical  Provider, MD  OXYGEN Inhale into the lungs. At night    Historical Provider, MD  PROAIR HFA 108 (90 BASE) MCG/ACT inhaler USE 2 PUFFS BY MOUTH EVERY 6 HOURS AS NEEDED FOR SHORTNESS OF BREATH 08/26/14   Brand Males, MD  SPIRIVA HANDIHALER 18 MCG inhalation capsule INHALE 1 CAPSULE VIA HANDIHALER ONCE DAILY AT THE SAME TIME EVERY DAY 01/29/15   Brand Males, MD   BP 181/91 mmHg  Pulse 85  Temp(Src) 98.2 F (36.8 C) (Oral)  Resp 20  SpO2 93%  LMP 09/09/2014 (Approximate) Physical Exam  Constitutional: She is oriented to person, place, and time. She appears well-developed and well-nourished. No distress.  HENT:  Head: Normocephalic.  Eyes: Conjunctivae are normal.  Neck: Neck supple. No tracheal deviation present.  Cardiovascular: Normal rate and regular rhythm.   Pulmonary/Chest: Effort normal. No respiratory distress.  Abdominal: Soft. She exhibits no distension.  Neurological: She is alert and oriented to person, place, and time. She has normal strength. No cranial nerve deficit or sensory deficit. She exhibits normal muscle tone. Coordination normal. GCS eye subscore is 4. GCS verbal subscore is 5. GCS motor subscore is 6.  Patient with 5 out of 5 dorsiflexion and plantarflexion of the right foot, no sustained muscle contraction in the lower extremity  Skin: Skin is warm and dry.  Psychiatric: She has a normal mood and affect. Her speech is normal and behavior is normal.    ED Course  Procedures (including critical care time) Labs Review Labs Reviewed  COMPREHENSIVE METABOLIC PANEL - Abnormal; Notable for the following:    Glucose, Bld 155 (*)    BUN 35 (*)    Creatinine, Ser 1.92 (*)    GFR calc non Af Amer 29 (*)    GFR calc Af Amer 34 (*)    All other components within normal limits  I-STAT CHEM 8, ED - Abnormal; Notable for the following:    Chloride 99 (*)    BUN 38 (*)    Creatinine, Ser 1.80 (*)    Glucose, Bld 149 (*)    All other components within normal  limits  PROTIME-INR  APTT  CBC  DIFFERENTIAL  I-STAT TROPOININ, ED  CBG MONITORING, ED    Imaging Review Ct Head Wo Contrast  02/14/2015  CLINICAL DATA:  Stroke in July. Now having right side numbness and on equal gait. Headache at base of skull. EXAM: CT HEAD WITHOUT CONTRAST TECHNIQUE: Contiguous axial images were obtained from the base of the skull through the vertex without intravenous contrast. COMPARISON:  09/04/2014 FINDINGS: Focal area of low attenuation involving the posterior limb of left internal capsule is identified compatible with  chronic infarct. No evidence for acute cortical infarct, intracranial hemorrhage or mass. No abnormal extra-axial fluid collections identified. The mastoid air cells are clear. Partial opacification of the ethmoid air cells noted. IMPRESSION: 1. No acute intracranial abnormality. 2. Chronic left basal ganglia infarct. Electronically Signed   By: Kerby Moors M.D.   On: 02/14/2015 19:25   I have personally reviewed and evaluated these images and lab results as part of my medical decision-making.   EKG Interpretation None      MDM   Final diagnoses:  Muscle spasm of right leg    50 y.o. 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 She notes this intense muscle spasm pain over her thigh that occurs sometimes when she is in the car for too long and often when she works the dragging of her leg is not a constant phenomenon that occurs intermittently after the pain. Patient with no active stroke symptoms currently, no objective weakness or feeling of weakness with normal gait. CT head is negative for acute infarct despite ongoing symptoms for over a week. No significant hematologic or metabolic abnormalities to explain symptoms.  I discussed possibility of engaging neurology for  consultation but patient preferred to see her own neurologist in follow-up. As her symptoms are more typical of a sequela of stroke rather than an acute infarct I feel this is an appropriate disposition at this time. Patient was suggested to perform supportive care measures and get back with her physical therapy team regarding straightening of the leg and prevention of spasm, strict return precautions were discussed for worsening of her current symptoms, new weakness or other new concerning symptoms.    Leo Grosser, MD 02/14/15 2153

## 2015-02-14 NOTE — ED Notes (Signed)
Pt here for right leg weakness and dragging that started 1 week ago. Sts also pain in the back of her head. Denies any other symptoms. Hx of stroke with right arm involved but no residual.

## 2015-02-14 NOTE — ED Notes (Signed)
Pt understands discharge instructions, e signature not working

## 2015-02-14 NOTE — Discharge Instructions (Signed)

## 2015-02-20 ENCOUNTER — Other Ambulatory Visit: Payer: Self-pay

## 2015-02-21 ENCOUNTER — Telehealth: Payer: Self-pay | Admitting: Neurology

## 2015-02-21 NOTE — Telephone Encounter (Signed)
Patient called to advise that her right leg is stiffening up so bad, she drags it like a zombie, states she went to ER, they ruled out stroke, back of neck and top part of back is swollen.

## 2015-02-24 NOTE — Telephone Encounter (Signed)
I called the patient and discussed her problem. She believes she was taking potassium which was causing muscle spasms and seems to have improved after she stopped it. She still has right-sided weakness and tracks right leg from her prior stroke. She was advised to schedule a follow-up appointment if necessary.

## 2015-03-19 ENCOUNTER — Other Ambulatory Visit: Payer: Self-pay

## 2015-03-19 DIAGNOSIS — Z1231 Encounter for screening mammogram for malignant neoplasm of breast: Secondary | ICD-10-CM

## 2015-03-25 ENCOUNTER — Telehealth: Payer: Self-pay | Admitting: Neurology

## 2015-03-25 NOTE — Telephone Encounter (Signed)
Rn call patient back about her spasms in her right leg. Pt has seen her PCP, ED doctor and her sports medicine MD. Pt was prescribed tizanidine by her PCP, and prednisone by her sports medicine. Pt states nothing is working. Pt has had problems  Before with her leg. PT has history of right leg weakness. Rn stated Dr. Leonie Man can evaluate for his opinion. Pt has appt on 03/26/2015. Pt verbalized understanding.

## 2015-03-25 NOTE — Telephone Encounter (Signed)
Patient called to request earlier appointment, currently scheduled for 06/04/15 (next available 06/02/15-placed patient on cancellation list)  having muscle spasms that are causing her to drag her leg.

## 2015-03-25 NOTE — Telephone Encounter (Signed)
Ok to be seen on  03/26/15 by me or NP

## 2015-03-26 ENCOUNTER — Telehealth: Payer: Self-pay | Admitting: Neurology

## 2015-03-26 ENCOUNTER — Ambulatory Visit (INDEPENDENT_AMBULATORY_CARE_PROVIDER_SITE_OTHER): Payer: Medicare Other | Admitting: Neurology

## 2015-03-26 ENCOUNTER — Encounter: Payer: Self-pay | Admitting: Neurology

## 2015-03-26 ENCOUNTER — Other Ambulatory Visit: Payer: Self-pay | Admitting: Neurology

## 2015-03-26 VITALS — BP 153/87 | HR 76 | Ht 65.0 in | Wt 156.8 lb

## 2015-03-26 DIAGNOSIS — G8110 Spastic hemiplegia affecting unspecified side: Secondary | ICD-10-CM | POA: Insufficient documentation

## 2015-03-26 MED ORDER — TIZANIDINE HCL 2 MG PO TABS: 2.0000 mg | ORAL_TABLET | Freq: Three times a day (TID) | ORAL | Status: DC

## 2015-03-26 MED ORDER — ALPRAZOLAM 0.25 MG PO TABS: 0.2500 mg | ORAL_TABLET | Freq: Once | ORAL | Status: DC

## 2015-03-26 NOTE — Progress Notes (Signed)
[facility] [address] [place]. [place] [address] [phone]       OFFICE FOLLOW-UP NOTE  Karla. Karla Nelson Date of Birth:  October 31, 1965 Medical Record Number:  [id number]   HPI: Karla Nelson is a 51 year Caucasian lady who seen today for the first office follow-up visit following hospital admission for stroke in July 2016.Karla Nelson is a 50 y.o. female hx of HTN, NSTEMI, HLD, and cocaine usage presenting with 3 day history of right sided weakness. Notes symptoms started after an argument with her mother in law. Started in her RUE and now also involves RLE. Denies any sensory deficits. No visual or speech deficits.  MRI brain imaging  Personally reviewed, showed acute infarct in left posterior limb of internal capsule.  Date last known well: 08/29/2014 Time last known well: unclear tPA Given: no, outside tPA window Modified Rankin: Rankin Score=0. LDL cholesterol was elevated at 113 mg percent and hemoglobin A1c was 6.9. Transthoracic echo showed normal ejection fraction and carotid ultrasound showed no significant extra-axial stenosis. 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 Patient was counseled to quit smoking and drugs and started on aspirin. She states she's done well. Her right-sided weakness has improved only intermittently she will drag her right leg. She's been suffering from a cold for the last few days and plans to see her primary care physician for that soon. She continues to smoke and has not yet quit smoking but wants to discuss smoking cessation options with her primary physician. She is back to work and works part-time but has to work at the [employer]. Blood pressure is well controlled and today it is 144/83. She is tolerating Lipitor without myalgias or arthralgias. Update 03/26/2015 : She returns for follow-up after last visit 3 months ago. She has been complaining of increasing spasms in her right leg and cramps. She initially felt this was  related to aching potassium which she has discontinued but trouble walking, leg cramps and spasms continue. She was started by her primary physician and Zanaflex but she takes it only once a day and it doesn't work as well. She was seen in the emergency room and had a CT scan of the head last month which showed no acute abnormality and old left basal ganglia infarct. She was unable to return to work at [employer] as she had to be on her feet all day and she had trouble walking with that. She states she's lost about 20 pounds and is eating healthy. She remains on aspirin which is tolerating well. She has not yet quit smoking but plans to do so. She states her blood pressure is under good control though it is elevated today at 153/87. ROS:   14 system review of systems is positive for Like cramps, spasms, aching muscles, joint pain and swelling, walking difficulty, leg swelling, restless legs and all other systems negative  PMH:  Past Medical History  Diagnosis Date  . HTN (hypertension)     Has normal renal arteries.  . Cleft palate     special denture covers defect like a C, sleeps with device in place  . NSTEMI (non-ST elevated myocardial infarction) ([place]) 09-25-2010  . DYSPNEA ON EXERTION 03/08/2007  . ELECTROCARDIOGRAM, ABNORMAL 02/13/2010  . CAD (coronary artery disease)     NSTEMI with cath in August 2012 with nonobstructive disease, 50% ostial D1 and 70% distal LCX and PL disease. Normal EF. Has diastolic dysfunction with elevated EDP  . Tobacco abuse disorder   .  Hyperlipidemia   . Asthma   . Stroke [facility])     july 2016, Dr Donnella Sham  . COPD (chronic obstructive pulmonary disease) ([facility])     o2 and bedtime   . History of oxygen administration     bedtime    Social History:  Social History   Social History  . Marital Status: Single    Spouse Name: N/A  . Number of Children: N/A  . Years of Education: N/A   Occupational History  . Not on file.   Social History Main Topics  .  Smoking status: Current Every Day Smoker -- 0.50 packs/day for 31 years    Types: Cigarettes  . Smokeless tobacco: Never Used  . Alcohol Use: No  . Drug Use: No     Comment: former drug use  . Sexual Activity: Yes   Other Topics Concern  . Not on file   Social History Narrative    Medications:   Current Outpatient Prescriptions on File Prior to Visit  Medication Sig Dispense Refill  . ADVAIR DISKUS 100-50 MCG/DOSE AEPB INHALE 1 PUFF INTO THE LUNGS 2 (TWO) TIMES DAILY. 60 each 0  . albuterol (PROVENTIL) (2.5 MG/3ML) 0.083% nebulizer solution Take 3 mLs (2.5 mg total) by nebulization every 6 (six) hours as needed for wheezing. 75 mL 12  . amLODipine (NORVASC) 10 MG tablet Take 10 mg by mouth at bedtime.  12  . aspirin 325 MG tablet Take 1 tablet (325 mg total) by mouth daily.    Marland Kitchen atorvastatin (LIPITOR) 20 MG tablet Take 1 tablet (20 mg total) by mouth daily at 6 PM. 30 tablet 1  . hydrALAZINE (APRESOLINE) 100 MG tablet TAKE 1 TABLET (100 MG TOTAL) BY MOUTH 3 (THREE) TIMES DAILY. 90 tablet 1  . labetalol (NORMODYNE) 300 MG tablet Take 300 mg by mouth 2 (two) times daily.    Marland Kitchen METHADONE HCL PO Take 40 mg by mouth daily.     . Multiple Vitamin (MULTIVITAMIN WITH MINERALS) TABS tablet Take 1 tablet by mouth daily.    . nitroGLYCERIN (NITROSTAT) 0.4 MG SL tablet Place 0.4 mg under the tongue every 5 (five) minutes as needed for chest pain.    . Omega-3 Fatty Acids (FISH OIL PO) Take 1 capsule by mouth every morning.    . OXYGEN Inhale into the lungs. At night    . SPIRIVA HANDIHALER 18 MCG inhalation capsule INHALE 1 CAPSULE VIA HANDIHALER ONCE DAILY AT THE SAME TIME EVERY DAY 30 capsule 5   No current facility-administered medications on file prior to visit.    Allergies:  No Known Allergies  Physical Exam General: Well-developed middle-aged African-American lady, seated, in no evident distress Head: head normocephalic and atraumatic.  Neck: supple with no carotid or supraclavicular  bruits Cardiovascular: regular rate and rhythm, no murmurs Musculoskeletal: no deformity Skin:  no rash/petichiae Vascular:  Normal pulses all extremities Respiratory : Scattered rhonchi bilaterally Filed Vitals:   03/26/15 1107  BP: 153/87  Pulse: 76   Neurologic Exam Mental Status: Awake and fully alert. Oriented to place and time. Recent and remote memory intact. Attention span, concentration and fund of knowledge appropriate. Mood and affect appropriate.  Cranial Nerves: Fundoscopic exam reveals sharp disc margins. Pupils equal, briskly reactive to light. Extraocular movements full without nystagmus. Visual fields full to confrontation. Hearing intact. Facial sensation intact. Face, tongue, palate moves normally and symmetrically.  Motor: Normal bulk and tone. Normal strength in all tested extremity muscles. Diminished fine finger movements on  the right. Orbits left over right upper extremity. Mild 4/5 weakness in the right lower extremities with right mild ankle foot drop Sensory.: intact to touch ,pinprick .position and vibratory sensation.  Coordination: Intact in the upper extremities. Impaired in the right lower extremity.  Gait and Station: Arises from chair without difficulty. Stance is normal. Gait demonstrates right leg spasticity and dragging  Reflexes: 2+ and asymmetric brisker on right. Toes downgoing.       ASSESSMENT: 63 year patient with left internal capsule infarct in July 2016 secondary to small vessel disease with risk factors of smoking, hypertension and hyperlipidemia.. Right leg spasms and difficulty walking due to post stroke spasticity    PLAN:  I had a long discussion the patient with regards to her remote stroke and recent gait difficulties and increasing right leg spasticity and discuss available treatment options. I recommend she increase Zanaflex to 2 mg twice daily for one week and then to 3 times daily if tolerated. I will also refer her to neuro  rehabilitation clinic for Botox consideration for spasticity. Check MRI scan of the brain to rule out interval new stroke. Continue aspirin for stroke prevention and strict control of hypertension with blood pressure goal below 130/90, and lipids with LDL cholesterol goal below 70 mg percent and patient was also counseled to quit smoking. She was encouraged to eat a healthy diet and to be active. She will return for follow-up in 6 months with Gilford Raid, nurse practitioner or call earlier if necessary.  Antony Contras, MD Note: This document was prepared with digital dictation and possible smart phrase technology. Any transcriptional errors that result from this process are unintentional

## 2015-03-26 NOTE — Patient Instructions (Signed)
I had a long discussion the patient with regards to her remote stroke and recent gait difficulties and increasing right leg spasticity and discuss available treatment options. I recommend she increase Zanaflex to 2 mg twice daily for one week and then to 3 times daily if tolerated. I will also refer her to neuro rehabilitation clinic for Botox consideration for spasticity. Check MRI scan of the brain to rule out interval new stroke. Continue aspirin for stroke prevention and strict control of hypertension with blood pressure goal below 130/90, and lipids with LDL cholesterol goal below 70 mg percent and patient was also counseled to quit smoking. She was encouraged to eat a healthy diet and to be active. She will return for follow-up in 6 months with Gilford Raid, nurse practitioner or call earlier if necessary.

## 2015-03-26 NOTE — Telephone Encounter (Signed)
Pt called and is scheduled for MRI on Feb 23. She is claustrophobic and is wondering if she can have something to help her relax enough to get the test done. She understands she will need to have someone be there with her to drive. Please call and advise [phone]

## 2015-03-26 NOTE — Telephone Encounter (Signed)
Patient called requesting to speak with Karla Nelson about BOTOX. Please call [phone].

## 2015-03-26 NOTE — Telephone Encounter (Signed)
Ok done

## 2015-03-27 NOTE — Telephone Encounter (Signed)
Met with the patient in person this afternoon. She stopped by the office and i went over the referral process for botox with her and gave her a business card to [facility] with their number and address.

## 2015-03-27 NOTE — Telephone Encounter (Signed)
RN call patient to let her know that Dr.Sethi did prescribed Xanax for her upcoming MRI. Rn explain its only two pills and no refills. The medication is only for the MRI. RN stated the form will be left at the front desk,and she needs her ID to sign and pick it up. Rn explain she will not be able to drive to the MRI.  Rn explain she will need a driver to take her and pick her up from the MRI because of the possible sedation. Pt verbalized understanding.

## 2015-04-03 ENCOUNTER — Ambulatory Visit
Admission: RE | Admit: 2015-04-03 | Discharge: 2015-04-03 | Disposition: A | Discharge status: Home / Self Care | Payer: Medicare Other | Source: Ambulatory Visit | Attending: Neurology | Admitting: Neurology

## 2015-04-03 ENCOUNTER — Other Ambulatory Visit: Payer: Self-pay | Admitting: Neurology

## 2015-04-03 DIAGNOSIS — G8110 Spastic hemiplegia affecting unspecified side: Secondary | ICD-10-CM

## 2015-04-09 ENCOUNTER — Telehealth: Payer: Self-pay | Admitting: Physical Medicine & Rehabilitation

## 2015-04-09 ENCOUNTER — Other Ambulatory Visit: Payer: Self-pay | Admitting: *Deleted

## 2015-04-09 ENCOUNTER — Encounter: Payer: Self-pay | Admitting: Physical Medicine & Rehabilitation

## 2015-04-09 ENCOUNTER — Encounter: Payer: Medicare Other | Attending: Physical Medicine & Rehabilitation | Admitting: Physical Medicine & Rehabilitation

## 2015-04-09 VITALS — BP 156/70 | HR 70 | Resp 14

## 2015-04-09 DIAGNOSIS — J449 Chronic obstructive pulmonary disease, unspecified: Secondary | ICD-10-CM | POA: Insufficient documentation

## 2015-04-09 DIAGNOSIS — Z7982 Long term (current) use of aspirin: Secondary | ICD-10-CM | POA: Insufficient documentation

## 2015-04-09 DIAGNOSIS — J45909 Unspecified asthma, uncomplicated: Secondary | ICD-10-CM | POA: Insufficient documentation

## 2015-04-09 DIAGNOSIS — M791 Myalgia: Secondary | ICD-10-CM | POA: Diagnosis not present

## 2015-04-09 DIAGNOSIS — M609 Myositis, unspecified: Secondary | ICD-10-CM

## 2015-04-09 DIAGNOSIS — R269 Unspecified abnormalities of gait and mobility: Secondary | ICD-10-CM | POA: Diagnosis not present

## 2015-04-09 DIAGNOSIS — I2510 Atherosclerotic heart disease of native coronary artery without angina pectoris: Secondary | ICD-10-CM | POA: Diagnosis not present

## 2015-04-09 DIAGNOSIS — I10 Essential (primary) hypertension: Secondary | ICD-10-CM | POA: Insufficient documentation

## 2015-04-09 DIAGNOSIS — F17210 Nicotine dependence, cigarettes, uncomplicated: Secondary | ICD-10-CM | POA: Diagnosis not present

## 2015-04-09 DIAGNOSIS — E785 Hyperlipidemia, unspecified: Secondary | ICD-10-CM | POA: Insufficient documentation

## 2015-04-09 DIAGNOSIS — M79604 Pain in right leg: Secondary | ICD-10-CM | POA: Insufficient documentation

## 2015-04-09 DIAGNOSIS — Z8673 Personal history of transient ischemic attack (TIA), and cerebral infarction without residual deficits: Secondary | ICD-10-CM | POA: Diagnosis not present

## 2015-04-09 DIAGNOSIS — I252 Old myocardial infarction: Secondary | ICD-10-CM | POA: Insufficient documentation

## 2015-04-09 DIAGNOSIS — M62838 Other muscle spasm: Secondary | ICD-10-CM

## 2015-04-09 DIAGNOSIS — Z9981 Dependence on supplemental oxygen: Secondary | ICD-10-CM | POA: Insufficient documentation

## 2015-04-09 DIAGNOSIS — IMO0001 Reserved for inherently not codable concepts without codable children: Secondary | ICD-10-CM

## 2015-04-09 MED ORDER — METHOCARBAMOL 500 MG PO TABS: 500.0000 mg | ORAL_TABLET | Freq: Three times a day (TID) | ORAL | Status: DC | PRN

## 2015-04-09 MED ORDER — BACLOFEN 10 MG PO TABS: 10.0000 mg | ORAL_TABLET | Freq: Three times a day (TID) | ORAL | Status: DC

## 2015-04-09 NOTE — Telephone Encounter (Signed)
Tried to get prescriptions filled at [facility] on [address] and they are having issues with his license.

## 2015-04-09 NOTE — Progress Notes (Signed)
Subjective:    Patient ID: Karla Nelson, female    DOB: 1965/11/15, 50 y.o.   MRN: [id number]  HPI 49 y/o with pmh of HTN, COPD presents with pain and heaviness in her right leg.  She states she went to hospital and was told it was spasms.  It is located in her entire leg, along the posterior aspect.  This started in ~11/2014 after her stroke.  Steroids appear to improve the pain.  She cannot identify exacerbating facts.  It is sharp.  It is not radiating.  It is intermittent.  She describes it as a pull in her leg.   She feels knots in her leg and feels like she is pulling a brick. Muscle relaxers don't seem to improve the pain.   Prednisone improves the pain.  Heat improve.  Exercise improve the pain.  She has associated burning in her leg.  She denies back pain. She feels she is starting to get a similar sensation in her LLE. She uses a cane for ambulation.    Pain Inventory Average Pain 9 Pain Right Now 0. My pain is sharp  In the last 24 hours, has pain interfered with the following? General activity 5 Relation with others 5 Enjoyment of life 10 What TIME of day is your pain at its worst? night Sleep (in general) Good  Pain is worse with: walking, bending and some activites Pain improves with: rest, therapy/exercise and pacing activities Relief from Meds: no selection  Mobility walk with assistance use a cane how many minutes can you walk? 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  no  Neuro/Psych weakness trouble walking spasms anxiety loss of taste or smell  Prior Studies Any changes since last visit?  no  Physicians involved in your care Any changes since last visit?  no   Family History  Problem Relation Age of Onset  . Diabetes    . Hypertension    . Asthma Mother   . Heart disease Mother   . Hypertension Mother    . Asthma Brother    Social History   Social History  . Marital Status: Single    Spouse Name: N/A  . Number of Children: N/A  . Years of Education: N/A   Social History Main Topics  . Smoking status: Current Every Day Smoker -- 0.50 packs/day for 31 years    Types: Cigarettes  . Smokeless tobacco: Never Used  . Alcohol Use: No  . Drug Use: No     Comment: former drug use  . Sexual Activity: Yes   Other Topics Concern  . None   Social History Narrative   Past Surgical History  Procedure Laterality Date  . Cesarean section    . Cardiac catheterization  Aug 2012    Moderate nonobstructive multivessel CAD with an EF of 70 to 75%  . Coronary angioplasty      no further intervention last cardiac visit greater than a year   Past Medical History  Diagnosis Date  . HTN (hypertension)     Has normal renal arteries.  . Cleft palate     special denture covers defect like a C, sleeps with device in place  . NSTEMI (non-ST elevated myocardial infarction) ([place]) 09-25-2010  . DYSPNEA ON EXERTION 03/08/2007  . ELECTROCARDIOGRAM, ABNORMAL 02/13/2010  . CAD (coronary artery disease)  NSTEMI with cath in August 2012 with nonobstructive disease, 50% ostial D1 and 70% distal LCX and PL disease. Normal EF. Has diastolic dysfunction with elevated EDP  . Tobacco abuse disorder   . Hyperlipidemia   . Asthma   . Stroke [facility])     july 2016, Dr Donnella Sham  . COPD (chronic obstructive pulmonary disease) (HCC)     o2 and bedtime   . History of oxygen administration     bedtime   BP 156/70 mmHg  Pulse 70  Resp 14  SpO2 91%  LMP 09/09/2014 (Approximate)  Opioid Risk Score:   Fall Risk Score:  `1  Depression screen PHQ 2/9  Depression screen [facility] 2/9 11/01/2014 10/21/2014 10/03/2014 10/03/2014 09/26/2014  Decreased Interest 0 0 1 0 0  Down, Depressed, Hopeless 0 0 1 0 0  PHQ - 2 Score 0 0 2 0 0  Altered sleeping - - 1 - -  Tired, decreased energy - - 1 - -  Change in appetite - - 0 - -    Feeling bad or failure about yourself  - - 0 - -  Trouble concentrating - - 1 - -  Moving slowly or fidgety/restless - - 1 - -  Suicidal thoughts - - 0 - -  PHQ-9 Score - - 6 - -  Difficult doing work/chores - - Very difficult - -   Current Outpatient Prescriptions on File Prior to Visit  Medication Sig Dispense Refill  . ADVAIR DISKUS 100-50 MCG/DOSE AEPB INHALE 1 PUFF INTO THE LUNGS 2 (TWO) TIMES DAILY. 60 each 0  . albuterol (PROVENTIL) (2.5 MG/3ML) 0.083% nebulizer solution Take 3 mLs (2.5 mg total) by nebulization every 6 (six) hours as needed for wheezing. 75 mL 12  . ALPRAZolam (XANAX) 0.25 MG tablet Take 1 tablet (0.25 mg total) by mouth once. Take one tablet prior to mri and may repeat x 1 2 tablet 0  . amLODipine (NORVASC) 10 MG tablet Take 10 mg by mouth at bedtime.  12  . aspirin 325 MG tablet Take 1 tablet (325 mg total) by mouth daily.    Marland Kitchen atorvastatin (LIPITOR) 20 MG tablet Take 1 tablet (20 mg total) by mouth daily at 6 PM. 30 tablet 1  . hydrALAZINE (APRESOLINE) 100 MG tablet TAKE 1 TABLET (100 MG TOTAL) BY MOUTH 3 (THREE) TIMES DAILY. 90 tablet 1  . labetalol (NORMODYNE) 300 MG tablet Take 300 mg by mouth 2 (two) times daily.    Marland Kitchen METHADONE HCL PO Take 40 mg by mouth daily.     . Multiple Vitamin (MULTIVITAMIN WITH MINERALS) TABS tablet Take 1 tablet by mouth daily.    . nitroGLYCERIN (NITROSTAT) 0.4 MG SL tablet Place 0.4 mg under the tongue every 5 (five) minutes as needed for chest pain.    . Omega-3 Fatty Acids (FISH OIL PO) Take 1 capsule by mouth every morning.    . OXYGEN Inhale into the lungs. At night    . predniSONE (STERAPRED UNI-PAK 48 TAB) 10 MG (48) TBPK tablet See admin instructions.  0  . SPIRIVA HANDIHALER 18 MCG inhalation capsule INHALE 1 CAPSULE VIA HANDIHALER ONCE DAILY AT THE SAME TIME EVERY DAY 30 capsule 5  . tiZANidine (ZANAFLEX) 2 MG tablet Take 1 tablet (2 mg total) by mouth 3 (three) times daily. Start 2 mg twice daily x 1 week then three times  daily 90 tablet 3   No current facility-administered medications on file prior to visit.    Review of  Systems  Constitutional: Negative for fever and chills.  Gastrointestinal: Positive for constipation.  Genitourinary: Negative.   Neurological: Positive for weakness (RLE).  All other systems reviewed and are negative.     Objective:   Physical Exam HENT: Normocephalic, Atraumatic Eyes: EOMI, Conj WNL Cardio: S1, S2 normal, RRR Pulm: B/l clear to auscultation.  Effort normal Abd: Soft, non-distended, non-tender, BS+ MSK: Gait dragging RLE.   No TTP.  No edema.  Neuro: CN II-XII grossly intact.    Sensation intact to light touch in all UE and LE dermatomes  Reflexes 3+ RLE  Strength  5/5 in all UE myotomes    5/5 in all LLE myotomes    4/5 RLE hip flexion, 4+/5 knee extension, 4/5 ankle dorsi/plantar flexion  MAS: RLE 0/4 throughout Skin: Warm and Dry    Assessment & Plan:  50 y/o with pmh of HTN, COPD presents with pain and heaviness in her right leg.  1. Right leg pain and heaviness  Steroids appear to improve the pain  Cont Zanaflex, she is taking 2mg  BID without side effects  Will prescribe Baclofen 5 TID for 3 days and increase to 10 TID if tolerated  Will also provide prescription for Robaxin PRN  Cont Yoga  Pt does not have noticeable spasticity on exam and therefore, will hold off on botox for now.    2. Abnormality of gait  Cont cane for safety   3. Muscle spasms  See 1  4. Myalgia  Will consider trigger point injections in next visit.

## 2015-04-10 NOTE — Telephone Encounter (Signed)
Call placed to [facility] with DR Patel's license and DEA number and request that they make sure they are putting RX in under the correct Ankit Lorie Phenix MD, as there are several Ankit Patel's in the system and our physicians license is up to date.

## 2015-04-14 ENCOUNTER — Other Ambulatory Visit: Payer: Self-pay | Admitting: Internal Medicine

## 2015-04-21 ENCOUNTER — Emergency Department (HOSPITAL_COMMUNITY)
Admission: EM | Admit: 2015-04-21 | Discharge: 2015-04-21 | Disposition: A | Discharge status: Home / Self Care | Payer: Medicare Other | Attending: Emergency Medicine | Admitting: Emergency Medicine

## 2015-04-21 ENCOUNTER — Encounter (HOSPITAL_COMMUNITY): Payer: Self-pay | Admitting: Emergency Medicine

## 2015-04-21 DIAGNOSIS — I10 Essential (primary) hypertension: Secondary | ICD-10-CM | POA: Insufficient documentation

## 2015-04-21 DIAGNOSIS — J449 Chronic obstructive pulmonary disease, unspecified: Secondary | ICD-10-CM | POA: Insufficient documentation

## 2015-04-21 DIAGNOSIS — I252 Old myocardial infarction: Secondary | ICD-10-CM | POA: Insufficient documentation

## 2015-04-21 DIAGNOSIS — F419 Anxiety disorder, unspecified: Secondary | ICD-10-CM | POA: Diagnosis not present

## 2015-04-21 DIAGNOSIS — F17210 Nicotine dependence, cigarettes, uncomplicated: Secondary | ICD-10-CM | POA: Diagnosis not present

## 2015-04-21 DIAGNOSIS — I2510 Atherosclerotic heart disease of native coronary artery without angina pectoris: Secondary | ICD-10-CM | POA: Insufficient documentation

## 2015-04-21 DIAGNOSIS — Z8673 Personal history of transient ischemic attack (TIA), and cerebral infarction without residual deficits: Secondary | ICD-10-CM | POA: Diagnosis not present

## 2015-04-21 DIAGNOSIS — Z9981 Dependence on supplemental oxygen: Secondary | ICD-10-CM | POA: Diagnosis not present

## 2015-04-21 NOTE — ED Notes (Signed)
Pt arrives via EMS from home with c/o hypertension and anxiety. Pt has thoughts that she's dying. States she's been unable to sleep. Denies SI/ HI. Pt currently calm, cooperative, NAD at present.

## 2015-04-21 NOTE — ED Notes (Signed)
No answer in waiting room 

## 2015-05-02 ENCOUNTER — Ambulatory Visit: Payer: Medicare Other | Attending: Orthopedic Surgery

## 2015-05-02 DIAGNOSIS — R29898 Other symptoms and signs involving the musculoskeletal system: Secondary | ICD-10-CM | POA: Diagnosis present

## 2015-05-02 DIAGNOSIS — R269 Unspecified abnormalities of gait and mobility: Secondary | ICD-10-CM

## 2015-05-02 DIAGNOSIS — R262 Difficulty in walking, not elsewhere classified: Secondary | ICD-10-CM | POA: Diagnosis not present

## 2015-05-02 NOTE — Therapy (Signed)
[facility] [address] [place], [place], [address] Phone: [phone]   Fax:  [phone]  Physical Therapy Evaluation  Patient Details  Name: Karla Nelson MRN: [id number] Date of Birth: Jun 17, 1965 Referring Provider: Dr. Berenice Primas  Encounter Date: 05/02/2015      PT End of Session - 05/02/15 1627    Visit Number 1   Number of Visits 9   Date for PT Re-Evaluation 06/01/15   Authorization Type Medicare primary (G-code and progress note every 10th visit). Medicaid secondary.   PT Start Time 1533   PT Stop Time 1616   PT Time Calculation (min) 43 min   Equipment Utilized During Treatment Gait belt   Activity Tolerance Patient tolerated treatment well   Behavior During Therapy WFL for tasks assessed/performed      Past Medical History  Diagnosis Date  . HTN (hypertension)     Has normal renal arteries.  . Cleft palate     special denture covers defect like a C, sleeps with device in place  . NSTEMI (non-ST elevated myocardial infarction) ([place]) 09-25-2010  . DYSPNEA ON EXERTION 03/08/2007  . ELECTROCARDIOGRAM, ABNORMAL 02/13/2010  . CAD (coronary artery disease)     NSTEMI with cath in August 2012 with nonobstructive disease, 50% ostial D1 and 70% distal LCX and PL disease. Normal EF. Has diastolic dysfunction with elevated EDP  . Tobacco abuse disorder   . Hyperlipidemia   . Asthma   . Stroke [facility])     july 2016, Dr Donnella Sham  . COPD (chronic obstructive pulmonary disease) (HCC)     o2 and bedtime   . History of oxygen administration     bedtime    Past Surgical History  Procedure Laterality Date  . Cesarean section    . Cardiac catheterization  Aug 2012    Moderate nonobstructive multivessel CAD with an EF of 70 to 75%  . Coronary angioplasty      no further intervention last cardiac visit greater than a year    There were no vitals filed for this visit.  Visit Diagnosis:  Difficulty walking - Plan:  PT plan of care cert/re-cert  Right leg weakness - Plan: PT plan of care cert/re-cert  Abnormality of gait - Plan: PT plan of care cert/re-cert      Subjective Assessment - 05/02/15 1543    Subjective Pt reported she had OPPT neuro last year after CVA (08/2014) and reported she was doing better. However, she started to experience incr. muscle spasms while standing and when walking a lot. Pt reported gait deviations have caused back pain (L-sided). Pt denied falls in the last 6 months, but has experienced "close to falling". Pt reported fatigue after starting Baclofen.  Pt reported she is frustrated that she had to cease work due to R LE weakness, spasms and pain.   Pertinent History HTN, CAD, MI, CVA in 08/2014, asthma, COPD   Patient Stated Goals Walk better and decr. muscle spasms   Currently in Pain? No/denies            [facility] PT Assessment - 05/02/15 1546    Assessment   Medical Diagnosis CVA with residual right leg weakness   Referring Provider Dr. 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  Yes  can't work outside, can't lift   Is the patient reluctant to leave their home because of a fear of falling?  No   Home Environment   Living Environment Private residence   Living Arrangements Spouse/significant other  previous boyfriend   Type of [place] to enter   Entrance Stairs-Number of Steps 3   Entrance Stairs-Rails Left   Home Layout One level   [place] - single point;Bedside commode;Grab bars - tub/shower   Prior Function   Level of Independence Independent   Vocation On disability   Leisure [place],  clean    Cognition   Overall Cognitive Status Impaired/Different from baseline   Memory Appears intact  however, pt reports some short-term memory issues   Observation/Other Assessments   Focus on Therapeutic Outcomes (FOTO)  61.6%-SIS mobility   Sensation   Light Touch Impaired by gross assessment   Additional Comments Pt reported intermittent N/T in L hand (along C8), and intermittent N/T in R LE (worse in AM).  Decr. light touch on R UE and R LE.   Coordination   Gross Motor Movements are Fluid and Coordinated Yes   Fine Motor Movements are Fluid and Coordinated No   Finger Nose Finger Test Decr. R hand speed, required time for processing   Heel Shin Test R LE decr. speed   Posture/Postural Control   Posture/Postural Control Postural limitations   Postural Limitations Forward head;Increased lumbar lordosis   Tone   Assessment Location Right Lower Extremity;Left Lower Extremity   ROM / Strength   AROM / PROM / Strength AROM;Strength   AROM   Overall AROM  Within functional limits for tasks performed   Overall AROM Comments B UE/LE    Strength   Overall Strength Deficits   Overall Strength Comments LLE WFL (4+/5 to 5/5). R LE: hip flex: 3+/5, knee ext: 4/5 (decr. endurance pt had hard time maintaining full knee ext. 2/2 fatigue and pt reported LE felt heavy), knee flex: 4/5, ankle DF: 2/5. Hip abd/add tested in seated: grossly 3+/5.   Transfers   Transfers Sit to Stand;Stand to Sit   Sit to Stand Without upper extremity assist;5: Supervision;From chair/3-in-1   Stand to Sit 5: Supervision;Without upper extremity assist;To chair/3-in-1   Ambulation/Gait   Ambulation/Gait Yes   Ambulation/Gait Assistance 5: Supervision   Ambulation/Gait Assistance Details No overt LOB but pt noted to experience incr. postural sway during turns.   Ambulation Distance (Feet) 75 Feet   Assistive device None   Gait Pattern Step-through pattern;Decreased stride length;Decreased step length -  left;Decreased dorsiflexion - right;Decreased weight shift to right;Right genu recurvatum;Lateral trunk lean to right   Ambulation Surface Level;Indoor   Gait velocity 2.50ft/sec.  no AD   Standardized Balance Assessment   Standardized Balance Assessment Berg Balance Test;Timed Up and Go Test   Berg Balance Test   Sit to Stand Able to stand without using hands and stabilize independently   Standing Unsupported Able to stand safely 2 minutes   Sitting with Back Unsupported but Feet Supported on Floor or Stool Able to sit safely and securely 2 minutes   Stand to Sit Sits safely with minimal use of hands   Transfers Able to transfer safely, minor use of hands   Standing Unsupported with Eyes  Closed Able to stand 10 seconds with supervision   Standing Ubsupported with Feet Together Able to place feet together independently and stand 1 minute safely   From Standing, Reach Forward with Outstretched Arm Can reach forward >12 cm safely (5")  8"   From Standing Position, Pick up Object from [place] to pick up shoe safely and easily   From Standing Position, Turn to Look Behind Over each Shoulder Looks behind from both sides and weight shifts well   Turn 360 Degrees Needs close supervision or verbal cueing   Standing Unsupported, Alternately Place Feet on Step/Stool Able to complete 4 steps without aid or supervision   Standing Unsupported, One Foot in [place] to place foot tandem independently and hold 30 seconds   Standing on One Leg Tries to lift leg/unable to hold 3 seconds but remains standing independently  R LE 2-3 sec.   Total Score 46   Timed Up and Go Test   TUG Normal TUG   Normal TUG (seconds) 11.1  no AD   RLE Tone   RLE Tone Within Functional Limits   LLE Tone   LLE Tone Within Functional Limits                           PT Education - 05/02/15 1626    Education provided Yes   Education Details PT educated pt on frequency/duration and outcome measure  results. PT also educated pt that we won't be able to stop muscle spasms completely but can provide exercises to decr. tightness 2/2 spasms.   Person(s) Educated Patient   Methods Explanation   Comprehension Verbalized understanding          PT Short Term Goals - 05/02/15 1634    PT SHORT TERM GOAL #1   Title same as LTGs           PT Long Term Goals - 05/02/15 1634    PT LONG TERM GOAL #1   Title Pt will be IND in HEP to improve balance, strength, and flexibility. Target date: 05/30/15   Baseline No HEP   Status New   PT LONG TERM GOAL #2   Title Pt will improve BERG score to >/=51/56 to decr. falls risk. Target date: 05/30/15   Baseline 46/56   Status New   PT LONG TERM GOAL #3   Title Pt will improve gait speed to >/=2.15ft/sec. without AD to safely amb. in the community. Target date: 05/30/15   Baseline 2.39ft/sec no AD   Status New   PT LONG TERM GOAL #4   Title Pt will improve SIS-mobility score to 77% to improve quality of life. Target date: 05/30/15   Baseline 61.6%   Status New   PT LONG TERM GOAL #5   Title Pt will amb. 600' over even/uneven terrain, IND, to improve functional mobility and to perform yard work. Target date: 05/30/15   Baseline 17' with no AD, with S   Status New   Additional Long Term Goals   Additional Long Term Goals Yes   PT LONG TERM GOAL #6   Title Assess stairs and write goal. Target date: 05/30/15   Baseline Not yet assessed.   Status New               Plan - 05/02/15 1629    Clinical Impression Statement Pt is a pleasant 50y/o female presenting to OPPT neuro s/p CVA (Dx:I63.9) in 08/2014. Exam revealed the  following deficits: gait deviations, impaired coordination, impaired balance, decreased strength, postural dysfunction, pt reported R muscle spasm but PT no incr. in tone or spasticity noted during exam (however, pt took baclofen this morning). Pt's BERG score indicates she is at risk for falls.    Pt will benefit from skilled  therapeutic intervention in order to improve on the following deficits Abnormal gait;Postural dysfunction;Impaired flexibility;Decreased coordination;Difficulty walking;Decreased balance;Decreased mobility;Decreased knowledge of use of DME;Decreased strength;Pain  PT will not directly treat pain but will monitor closely   Rehab Potential Good   Clinical Impairments Affecting Rehab Potential co-morbidities   PT Frequency 2x / week   PT Duration 4 weeks   PT Treatment/Interventions ADLs/Self Care Home Management;Biofeedback;Canalith Repostioning;Balance training;Therapeutic exercise;Vestibular;Manual techniques;Therapeutic activities;Functional mobility training;Orthotic Fit/Training;Stair training;Gait training;DME Instruction;Neuromuscular re-education   PT Next Visit Plan Assess stairs mobility and write goal. Initiate balance and R LE strengthening (DF, hip ext, abd, quads) HEP   Consulted and Agree with Plan of Care Patient          G-Codes - 2015/05/14 1639    Functional Assessment Tool Used BEFG: 46/56; gait speed no AD: 2.36ft/sec, TUG no AD: 11.1sec.   Functional Limitation Mobility: Walking and moving around   Mobility: Walking and Moving Around Current Status [phone]) At least 20 percent but less than 40 percent impaired, limited or restricted   Mobility: Walking and Moving Around Goal Status [phone]) At least 1 percent but less than 20 percent impaired, limited or restricted       Problem List Patient Active Problem List   Diagnosis Date Noted  . Spastic hemiplegia affecting nondominant side ([place]) 03/26/2015  . Smoking 12/30/2014  . Chronic obstructive pulmonary disease ([place]) 12/30/2014  . Lacunar infarct, acute ([place]) 12/04/2014  . Acute ischemic stroke ([place]) 09/01/2014  . Stroke ([place]) 09/01/2014  . Right sided weakness   . Smoker 03/15/2014  . History of cocaine use 03/15/2014  . Asthma, moderate persistent 07/26/2013  . 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 Acute sinusitis  04/11/2013  . NSTEMI (non-ST elevated myocardial infarction) ([place]) 06/19/2012  . Acute-on-chronic kidney injury ([place]) 06/19/2012  . Community acquired pneumonia 06/16/2012  . COPD exacerbation ([place]) 12/05/2011  . COPD (chronic obstructive pulmonary disease) ([place]) 07/12/2011  . CAD (coronary artery disease) 01/13/2011  . Tobacco abuse 01/13/2011  . Mixed hyperlipidemia 10/13/2010  . ELECTROCARDIOGRAM, ABNORMAL 02/13/2010  . CLEFT PALATE 02/12/2010  . Essential hypertension 03/08/2007  . DYSPNEA ON EXERTION 03/08/2007    Cookie Pore L 05-14-2015, 4:41 PM  [facility] [address] [place], [place], [address] Phone: [phone]   Fax:  [phone]  Name: Karla Nelson MRN: [id number] Date of Birth: 1965/06/04   Geoffry Paradise, PT,DPT 05/14/15 4:41 PM Phone: [phone] Fax: [phone]

## 2015-05-07 ENCOUNTER — Encounter: Payer: Self-pay | Admitting: Physical Medicine & Rehabilitation

## 2015-05-07 ENCOUNTER — Encounter (HOSPITAL_BASED_OUTPATIENT_CLINIC_OR_DEPARTMENT_OTHER): Payer: Medicare Other | Admitting: Physical Medicine & Rehabilitation

## 2015-05-07 VITALS — BP 151/75 | HR 76 | Resp 15

## 2015-05-07 DIAGNOSIS — M62838 Other muscle spasm: Secondary | ICD-10-CM | POA: Diagnosis not present

## 2015-05-07 DIAGNOSIS — M79604 Pain in right leg: Secondary | ICD-10-CM | POA: Diagnosis not present

## 2015-05-07 DIAGNOSIS — R258 Other abnormal involuntary movements: Secondary | ICD-10-CM | POA: Diagnosis not present

## 2015-05-07 DIAGNOSIS — R269 Unspecified abnormalities of gait and mobility: Secondary | ICD-10-CM | POA: Diagnosis not present

## 2015-05-07 DIAGNOSIS — R252 Cramp and spasm: Secondary | ICD-10-CM

## 2015-05-07 NOTE — Progress Notes (Signed)
Subjective:    Patient ID: Karla Nelson, female    DOB: 06-17-1965, 50 y.o.   MRN: [id number]  HPI  50 y/o with pmh of HTN, COPD presents for follow up of pain and heaviness in her right leg.  Last seen in clinic 04/09/15.  It is located in her entire leg, along the posterior aspect.  This started in ~11/2014 after her stroke.  Steroids appear to improve the pain.  She cannot identify exacerbating facts.  It is sharp.  It is not radiating.  It is intermittent.  She describes it as a pull in her leg.    She feels knots in her leg and feels like she is pulling a brick. Muscle relaxers don't seem to improve the pain.   Prednisone improves the pain.  Heat improve.  Exercise improve the pain.   She has associated burning in her leg.  She denies back pain. She feels she is starting to get a similar sensation in her LLE. She uses a cane for ambulation.   At last visit she was encouraged to cont zanaflex, but she stopped that and started the baclofen.  She is only taking baclofen at night and states that it really gets stiff at night.  She has not taken the robaxin.    Pain Inventory Average Pain 4 Pain Right Now 0 My pain is NA  In the last 24 hours, has pain interfered with the following? General activity NA Relation with others NA Enjoyment of life NA What TIME of day is your pain at its worst? evening Sleep (in general) Poor  Pain is worse with: walking and standing Pain improves with: pacing activities Relief from Meds: 9  Mobility how many minutes can you walk? 5-10 ability to climb steps?  yes do you drive?  yes  Function disabled: date disabled 28 I need assistance with the following:  household duties  Neuro/Psych spasms confusion anxiety  Prior Studies Any changes since last visit?  no  Physicians involved in your care Primary care Dr. Leonie Man Neurologist Dr. Nancy Fetter   Family History  Problem Relation Age of Onset  . Diabetes    . Hypertension    . Asthma  Mother   . Heart disease Mother   . Hypertension Mother   . Asthma Brother    Social History   Social History  . Marital Status: Single    Spouse Name: N/A  . Number of Children: N/A  . Years of Education: N/A   Social History Main Topics  . Smoking status: Former Smoker -- 0.50 packs/day for 31 years    Types: Cigarettes    Quit date: 04/18/2015  . Smokeless tobacco: Never Used  . Alcohol Use: No  . Drug Use: No     Comment: former drug use  . Sexual Activity: Yes   Other Topics Concern  . None   Social History Narrative   Past Surgical History  Procedure Laterality Date  . Cesarean section    . Cardiac catheterization  Aug 2012    Moderate nonobstructive multivessel CAD with an EF of 70 to 75%  . Coronary angioplasty      no further intervention last cardiac visit greater than a year   Past Medical History  Diagnosis Date  . HTN (hypertension)     Has normal renal arteries.  . Cleft palate     special denture covers defect like a C, sleeps with device in place  . NSTEMI (non-ST elevated myocardial infarction) (  [place]) 09-25-2010  . DYSPNEA ON EXERTION 03/08/2007  . ELECTROCARDIOGRAM, ABNORMAL 02/13/2010  . CAD (coronary artery disease)     NSTEMI with cath in August 2012 with nonobstructive disease, 50% ostial D1 and 70% distal LCX and PL disease. Normal EF. Has diastolic dysfunction with elevated EDP  . Tobacco abuse disorder   . Hyperlipidemia   . Asthma   . Stroke [facility])     july 2016, Dr Donnella Sham  . COPD (chronic obstructive pulmonary disease) (HCC)     o2 and bedtime   . History of oxygen administration     bedtime   BP 151/75 mmHg  Pulse 76  Resp 15  SpO2 96%  LMP 09/09/2014 (Approximate)  Opioid Risk Score:   Fall Risk Score:  `1  Depression screen PHQ 2/9  Depression screen [facility] 2/9 11/01/2014 10/21/2014 10/03/2014 10/03/2014 09/26/2014  Decreased Interest 0 0 1 0 0  Down, Depressed, Hopeless 0 0 1 0 0  PHQ - 2 Score 0 0 2 0 0  Altered sleeping - -  1 - -  Tired, decreased energy - - 1 - -  Change in appetite - - 0 - -  Feeling bad or failure about yourself  - - 0 - -  Trouble concentrating - - 1 - -  Moving slowly or fidgety/restless - - 1 - -  Suicidal thoughts - - 0 - -  PHQ-9 Score - - 6 - -  Difficult doing work/chores - - Very difficult - -   Review of Systems  Neurological:       Spasms   Psychiatric/Behavioral: Positive for confusion. The patient is nervous/anxious.   All other systems reviewed and are negative.     Objective:   Physical Exam HENT: Normocephalic, Atraumatic Eyes: EOMI, Conj WNL Cardio: S1, S2 normal, RRR Pulm: B/l clear to auscultation.  Effort normal Abd: Soft, non-distended, non-tender, BS+ MSK:  Gait dragging RLE.               No TTP.    No edema.   Neuro: CN II-XII grossly intact.                Sensation intact to light touch in all UE and LE dermatomes             Reflexes 3+ RLE             Strength          5/5 in all UE myotomes                                     5/5 in all LLE myotomes                                     4/5 RLE hip flexion, 4+/5 knee extension, 4/5 ankle dorsi/plantar flexion             MAS: RLE 0/4 throughout, except for 1/4 plantar flexion Skin: Warm and Dry     Assessment & Plan:  50 y/o with pmh of HTN, COPD presents with pain and heaviness in her right leg.  1. Right leg pain and heaviness             Steroids appear to improve the pain  Pt to take Zanaflex, she is taking 2mg  BID without side effects             Baclofen 10 qhs (pt does not want to take TID and does not want to increase the dose)             Awaiting approval for Robaxin PRN  Cont heating pad             Cont Yoga  Will schedule pt for botox on next visit for plantar flexors   2. Abnormality of gait             Cont cane for safety              3. Muscle spasms             See 1

## 2015-05-13 ENCOUNTER — Ambulatory Visit: Payer: Medicare Other | Attending: Orthopedic Surgery | Admitting: Physical Therapy

## 2015-05-13 DIAGNOSIS — R2689 Other abnormalities of gait and mobility: Secondary | ICD-10-CM | POA: Insufficient documentation

## 2015-05-13 DIAGNOSIS — M6281 Muscle weakness (generalized): Secondary | ICD-10-CM | POA: Insufficient documentation

## 2015-05-16 ENCOUNTER — Ambulatory Visit: Payer: Medicare Other

## 2015-05-16 DIAGNOSIS — R2689 Other abnormalities of gait and mobility: Secondary | ICD-10-CM | POA: Diagnosis not present

## 2015-05-16 DIAGNOSIS — M6281 Muscle weakness (generalized): Secondary | ICD-10-CM | POA: Diagnosis present

## 2015-05-16 NOTE — Therapy (Signed)
[facility] [address] [place], [place], [address] Phone: [phone]   Fax:  [phone]  Physical Therapy Treatment  Patient Details  Name: Karla Nelson MRN: [id number] Date of Birth: 11/02/1965 Referring Provider: Dr. Berenice Primas  Encounter Date: 05/16/2015      PT End of Session - 05/16/15 1641    Visit Number 2   Number of Visits 9   Date for PT Re-Evaluation 06/01/15   Authorization Type Medicare primary (G-code and progress note every 10th visit). Medicaid secondary.   PT Start Time 1446   PT Stop Time 1529   PT Time Calculation (min) 43 min   Equipment Utilized During Treatment Gait belt   Activity Tolerance Patient tolerated treatment well   Behavior During Therapy WFL for tasks assessed/performed      Past Medical History  Diagnosis Date  . HTN (hypertension)     Has normal renal arteries.  . Cleft palate     special denture covers defect like a C, sleeps with device in place  . NSTEMI (non-ST elevated myocardial infarction) ([place]) 09-25-2010  . DYSPNEA ON EXERTION 03/08/2007  . ELECTROCARDIOGRAM, ABNORMAL 02/13/2010  . CAD (coronary artery disease)     NSTEMI with cath in August 2012 with nonobstructive disease, 50% ostial D1 and 70% distal LCX and PL disease. Normal EF. Has diastolic dysfunction with elevated EDP  . Tobacco abuse disorder   . Hyperlipidemia   . Asthma   . Stroke [facility])     july 2016, Dr Donnella Sham  . COPD (chronic obstructive pulmonary disease) (HCC)     o2 and bedtime   . History of oxygen administration     bedtime    Past Surgical History  Procedure Laterality Date  . Cesarean section    . Cardiac catheterization  Aug 2012    Moderate nonobstructive multivessel CAD with an EF of 70 to 75%  . Coronary angioplasty      no further intervention last cardiac visit greater than a year    There were no vitals filed for this visit.      Subjective Assessment - 05/16/15 1448    Subjective Pt denied falls since last visit. Pt reported spasms are worse in LEs/hips at night (1-3am). Pt would like to reduce frequency to 1x/week, and only keep appt's that Medicaid will cover.   Pertinent History HTN, CAD, MI, CVA in 08/2014, asthma, COPD   Patient Stated Goals Walk better and decr. muscle spasms   Currently in Pain? No/denies        Therex and neuro re-ed: Pt performed strengthening, balance, and stretching HEP with cues for technique. Please see pt instructions for details.                 [facility] Adult PT Treatment/Exercise - 05/16/15 1640    Ambulation/Gait   Ambulation/Gait Yes   Stairs Yes   Stairs Assistance 5: Supervision   Stairs Assistance Details (indicate cue type and reason) Cues to improve ant. wt. shifting and R knee/hip flexion.   Stair Management Technique One rail Left;Alternating pattern;Forwards   Number of Stairs 8   Height of Stairs 6                PT Education - 05/16/15 1640    Education provided Yes   Education Details Provided pt with balance, strength, and stretching HEP.  PT also informed pt of Medicaid covering 3 visits, pt stated she can't afford co-pay after Medicaid ceases coverage (  even with Medicare primary). Therefore, frequency reduced to 1x/week for 3 weeks.   Person(s) Educated Patient   Methods Explanation;Demonstration;Tactile cues;Verbal cues;Handout   Comprehension Returned demonstration;Verbalized understanding;Need further instruction          PT Short Term Goals - 05/02/15 1634    PT SHORT TERM GOAL #1   Title same as LTGs           PT Long Term Goals - 05/16/15 1644    PT LONG TERM GOAL #1   Title Pt will be IND in HEP to improve balance, strength, and flexibility. Target date: 05/30/15   Baseline No HEP   Status On-going   PT LONG TERM GOAL #2   Title Pt will improve BERG score to >/=51/56 to decr. falls risk. Target date: 05/30/15   Baseline 46/56   Status On-going   PT LONG TERM  GOAL #3   Title Pt will improve gait speed to >/=2.65ft/sec. without AD to safely amb. in the community. Target date: 05/30/15   Baseline 2.58ft/sec no AD   Status On-going   PT LONG TERM GOAL #4   Title Pt will improve SIS-mobility score to 77% to improve quality of life. Target date: 05/30/15   Baseline 61.6%   Status On-going   PT LONG TERM GOAL #5   Title Pt will amb. 600' over even/uneven terrain, IND, to improve functional mobility and to perform yard work. Target date: 05/30/15   Baseline 52' with no AD, with S   Status On-going   PT LONG TERM GOAL #6   Title Assess stairs and write goal. Target date: 05/30/15. Pt will traverse 8 steps with one handrail at MOD I level to improve functional mobility. Target date: 05/30/15   Baseline 8 steps with S and one handrail.   Status Revised               Plan - 05/16/15 1642    Clinical Impression Statement Pt experienced R genu recurvatum during gait (intermittently) and during heel amb., 2/2 weak R hamstring muscles. PT will provide hamstring curls next session to improve strength, as unable to perform today due to time constraints. Pt demonstrated improved strengthening technique with minimal cues. Continue with POC.    Rehab Potential Good   Clinical Impairments Affecting Rehab Potential co-morbidities   PT Frequency 2x / week  reduced to 1x/week for 3 weeks per pt request 2/2 insurance limiting visits   PT Duration 4 weeks   PT Treatment/Interventions ADLs/Self Care Home Management;Biofeedback;Canalith Repostioning;Balance training;Therapeutic exercise;Vestibular;Manual techniques;Therapeutic activities;Functional mobility training;Orthotic Fit/Training;Stair training;Gait training;DME Instruction;Neuromuscular re-education   PT Next Visit Plan Bridges or hamstring curls for strength. Gait with SPC   PT Home Exercise Plan Balance, strength, stretches HEP   Consulted and Agree with Plan of Care Patient      Patient will benefit  from skilled therapeutic intervention in order to improve the following deficits and impairments:  Abnormal gait, Postural dysfunction, Impaired flexibility, Decreased coordination, Difficulty walking, Decreased balance, Decreased mobility, Decreased knowledge of use of DME, Decreased strength, Pain  Visit Diagnosis: Other abnormalities of gait and mobility - Plan: PT plan of care cert/re-cert  Muscle weakness (generalized) - Plan: PT plan of care cert/re-cert     Problem List Patient Active Problem List   Diagnosis Date Noted  . Spastic hemiplegia affecting nondominant side ([place]) 03/26/2015  . Smoking 12/30/2014  . Chronic obstructive pulmonary disease ([place]) 12/30/2014  . Lacunar infarct, acute ([place]) 12/04/2014  . Acute ischemic stroke ([place])  09/01/2014  . Stroke ([place]) 09/01/2014  . Right sided weakness   . Smoker 03/15/2014  . History of cocaine use 03/15/2014  . Asthma, moderate persistent 07/26/2013  . 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 Acute sinusitis 04/11/2013  . NSTEMI (non-ST elevated myocardial infarction) ([place]) 06/19/2012  . Acute-on-chronic kidney injury ([place]) 06/19/2012  . Community acquired pneumonia 06/16/2012  . COPD exacerbation ([place]) 12/05/2011  . COPD (chronic obstructive pulmonary disease) ([place]) 07/12/2011  . CAD (coronary artery disease) 01/13/2011  . Tobacco abuse 01/13/2011  . Mixed hyperlipidemia 10/13/2010  . ELECTROCARDIOGRAM, ABNORMAL 02/13/2010  . CLEFT PALATE 02/12/2010  . Essential hypertension 03/08/2007  . DYSPNEA ON EXERTION 03/08/2007    Danford Tat L 05/16/2015, 4:50 PM  [facility] [address] [place], [place], [address] Phone: [phone]   Fax:  [phone]  Name: Karla Nelson MRN: [id number] Date of Birth: May 30, 1965    Geoffry Paradise, PT,DPT 05/16/2015 4:50 PM Phone: [phone] Fax: [phone]

## 2015-05-16 NOTE — Patient Instructions (Signed)
Hip Flexor Stretch    Lying on back near edge of bed, bend one leg, foot flat. Hang other leg over edge, relaxed, thigh resting entirely on bed for _1-2___ minutes. Repeat __2__ times. Do _2-3___ sessions per day. Advanced Exercise: Bend knee back keeping thigh in contact with bed.  http://gt2.exer.us/347   Copyright  VHI. All rights reserved.   Lower Trunk Rotation Stretch    Place two pillows on either side of knees. Keeping back flat and feet together, rotate knees to left side. Hold __30__ seconds. Repeat with knees rotated to right side. Repeat __3__ times per set. Do __1__ sets per session. Do __2-3__ sessions per day. Progress by removing one pillow at a time. http://orth.exer.us/123   Copyright  VHI. All rights reserved.   FUNCTIONAL MOBILITY: Wall Squat    Stance: shoulder-width on floor, against wall. Place feet in front of hips. Bend hips and knees. Keep back straight. Do not allow knees to bend past toes. Hold for 5 seconds. Squeeze glutes and quads to stand.  _5__ reps per set, _2__ sets per day, _3-4__ days per week Progress by holding squat for 10 seconds or more.  Copyright  VHI. All rights reserved.    With hands on counter as needed: with red band tied above knees, perform mini squat and then sidestep 5 times to the right side and then 5 times to the left side. Perform 2 sets, 3-4 days a week. Progress to stronger resistance band, inform PT.   Backward    Walk backwards with eyes open, holding onto cabinet as needed. Take 10 even steps, making sure each foot lifts off floor. Repeat for __4__ time per session. Do _1___ sessions per day.  Copyright  VHI. All rights reserved.   Walking on Heels    Walk on heels for _10__ feet while continuing on a straight path, while holding onto counter. Do _4__ sessions per day, 3-4 days per week..  Copyright  VHI. All rights reserved.   Heel Raises    Stand with support.With knees straight, raise  heels off ground. 20 times, quickly.  Repeat _10__ times. Do _1__ times a day.  Copyright  VHI. All rights reserved.

## 2015-05-19 ENCOUNTER — Ambulatory Visit: Payer: Medicare Other

## 2015-05-21 ENCOUNTER — Ambulatory Visit: Payer: Medicare Other | Admitting: Physical Therapy

## 2015-05-21 DIAGNOSIS — R2689 Other abnormalities of gait and mobility: Secondary | ICD-10-CM

## 2015-05-21 NOTE — Therapy (Signed)
[facility] [address] [place], [place], [address] Phone: [phone]   Fax:  [phone]  Physical Therapy Treatment  Patient Details  Name: Karla Nelson MRN: [id number] Date of Birth: Nov 04, 1965 Referring Provider: Dr. Berenice Primas  Encounter Date: 05/21/2015      PT End of Session - 05/21/15 0851    Visit Number 3   Number of Visits 9   Date for PT Re-Evaluation 06/01/15   Authorization Type Medicare primary (G-code and progress note every 10th visit). Medicaid secondary.   PT Start Time 0806   PT Stop Time 0846   PT Time Calculation (min) 40 min   Activity Tolerance Patient tolerated treatment well   Behavior During Therapy [facility] for tasks assessed/performed      Past Medical History  Diagnosis Date  . HTN (hypertension)     Has normal renal arteries.  . Cleft palate     special denture covers defect like a C, sleeps with device in place  . NSTEMI (non-ST elevated myocardial infarction) ([place]) 09-25-2010  . DYSPNEA ON EXERTION 03/08/2007  . ELECTROCARDIOGRAM, ABNORMAL 02/13/2010  . CAD (coronary artery disease)     NSTEMI with cath in August 2012 with nonobstructive disease, 50% ostial D1 and 70% distal LCX and PL disease. Normal EF. Has diastolic dysfunction with elevated EDP  . Tobacco abuse disorder   . Hyperlipidemia   . Asthma   . Stroke [facility])     july 2016, Dr Donnella Sham  . COPD (chronic obstructive pulmonary disease) (HCC)     o2 and bedtime   . History of oxygen administration     bedtime    Past Surgical History  Procedure Laterality Date  . Cesarean section    . Cardiac catheterization  Aug 2012    Moderate nonobstructive multivessel CAD with an EF of 70 to 75%  . Coronary angioplasty      no further intervention last cardiac visit greater than a year    There were no vitals filed for this visit.      Subjective Assessment - 05/21/15 0809    Subjective "Things are still the same.  I wish  they were different but I'm trying to deal with it."   Pertinent History HTN, CAD, MI, CVA in 08/2014, asthma, COPD   Patient Stated Goals Walk better and decr. muscle spasms   Currently in Pain? No/denies        Pt treatment consisted of performing HEP as noted in patient instructions.  See patient instructions for details.  Also performed bil knee press x 15 reps with 40# and RLE only x 25# x 10 reps.  Pt needed verbal and tactile cues to control R knee hyperextension.          PT Education - 05/21/15 0851    Education provided Yes   Education Details HEP, adding weights as pt able to complete full ROM   Person(s) Educated Patient   Methods Explanation;Demonstration;Verbal cues;Tactile cues;Handout   Comprehension Verbalized understanding;Returned demonstration          PT Short Term Goals - 05/02/15 1634    PT SHORT TERM GOAL #1   Title same as LTGs           PT Long Term Goals - 05/16/15 1644    PT LONG TERM GOAL #1   Title Pt will be IND in HEP to improve balance, strength, and flexibility. Target date: 05/30/15   Baseline No HEP   Status On-going  PT LONG TERM GOAL #2   Title Pt will improve BERG score to >/=51/56 to decr. falls risk. Target date: 05/30/15   Baseline 46/56   Status On-going   PT LONG TERM GOAL #3   Title Pt will improve gait speed to >/=2.23ft/sec. without AD to safely amb. in the community. Target date: 05/30/15   Baseline 2.54ft/sec no AD   Status On-going   PT LONG TERM GOAL #4   Title Pt will improve SIS-mobility score to 77% to improve quality of life. Target date: 05/30/15   Baseline 61.6%   Status On-going   PT LONG TERM GOAL #5   Title Pt will amb. 600' over even/uneven terrain, IND, to improve functional mobility and to perform yard work. Target date: 05/30/15   Baseline 3' with no AD, with S   Status On-going   PT LONG TERM GOAL #6   Title Assess stairs and write goal. Target date: 05/30/15. Pt will traverse 8 steps with one  handrail at MOD I level to improve functional mobility. Target date: 05/30/15   Baseline 8 steps with S and one handrail.   Status Revised               Plan - 05/21/15 [id number]    Clinical Impression Statement Pt continues with R hamstring weakness and R knee genu recurvatum during gait and exercises.  Continue PT per POC.   Rehab Potential Good   Clinical Impairments Affecting Rehab Potential co-morbidities   PT Frequency 2x / week  reduced to 1x/week for 3 weeks per pt request 2/2 insurance limiting visits   PT Duration 4 weeks   PT Treatment/Interventions ADLs/Self Care Home Management;Biofeedback;Canalith Repostioning;Balance training;Therapeutic exercise;Vestibular;Manual techniques;Therapeutic activities;Functional mobility training;Orthotic Fit/Training;Stair training;Gait training;DME Instruction;Neuromuscular re-education   PT Next Visit Plan Continue LE strengthening, try adding weights.  Discuss d/c vs continued visits seconday to Medicaid only covers 3 visits.   PT Home Exercise Plan Balance, strength, stretches HEP   Consulted and Agree with Plan of Care Patient      Patient will benefit from skilled therapeutic intervention in order to improve the following deficits and impairments:  Abnormal gait, Postural dysfunction, Impaired flexibility, Decreased coordination, Difficulty walking, Decreased balance, Decreased mobility, Decreased knowledge of use of DME, Decreased strength, Pain  Visit Diagnosis: Other abnormalities of gait and mobility     Problem List Patient Active Problem List   Diagnosis Date Noted  . Spastic hemiplegia affecting nondominant side ([place]) 03/26/2015  . Smoking 12/30/2014  . Chronic obstructive pulmonary disease ([place]) 12/30/2014  . Lacunar infarct, acute ([place]) 12/04/2014  . Acute ischemic stroke ([place]) 09/01/2014  . Stroke ([place]) 09/01/2014  . Right sided weakness   . Smoker 03/15/2014  . History of cocaine use 03/15/2014  . Asthma, moderate  persistent 07/26/2013  . 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 Acute sinusitis 04/11/2013  . NSTEMI (non-ST elevated myocardial infarction) ([place]) 06/19/2012  . Acute-on-chronic kidney injury ([place]) 06/19/2012  . Community acquired pneumonia 06/16/2012  . COPD exacerbation ([place]) 12/05/2011  . COPD (chronic obstructive pulmonary disease) ([place]) 07/12/2011  . CAD (coronary artery disease) 01/13/2011  . Tobacco abuse 01/13/2011  . Mixed hyperlipidemia 10/13/2010  . ELECTROCARDIOGRAM, ABNORMAL 02/13/2010  . CLEFT PALATE 02/12/2010  . Essential hypertension 03/08/2007  . DYSPNEA ON EXERTION 03/08/2007    Narda Bonds 05/21/2015, 8:56 AM  [facility] [address] [place], [place], [address] Phone: [phone]   Fax:  [phone]  Name: Karla Nelson MRN: [id number]  Date of Birth: 06/01/1965    Narda Bonds, [place] 05/21/2015 8:56 AM Phone: [phone] Fax: [phone]

## 2015-05-21 NOTE — Patient Instructions (Signed)
Therapeutic - Bridging    Lift buttocks, keeping back straight and arms on floor. Hold 2-3 seconds. Repeat 10 times.  Do once a day.  Copyright  VHI. All rights reserved.   HIP: Extension, Bridging Unilateral    Bend right knee and place foot on surface. Push down through bent leg, tighten glutes. Raise hips up. 10 reps sets per day.  Do once a day.   Copyright  VHI. All rights reserved.   Hamstrings    Lie on stomach with no weight on right ankle. Bend same knee 90 degrees, pointing toes toward knee. Do not bend hips. Hold 2-3 seconds. Repeat 10 times. Do 1 sessions per day. CAUTION: Move slowly.  Copyright  VHI. All rights reserved.   HIP / KNEE: Extension - Standing    Squeeze glutes. Raise and lift right leg backward. Keep knee straight or slightly bent. 10 reps per set, one set per day.  Hold onto a support.  Copyright  VHI. All rights reserved.   "I love a Parade" Lift    Using a chair if necessary, march in place with right leg as high as you can. Repeat 10 times. Do 1 sessions per day.  http://gt2.exer.us/345   Copyright  VHI. All rights reserved.   ABDUCTION: Standing (Active)    Stand, feet flat. Lift right leg out to side.  Complete 10 repetitions. Perform 1 sessions per day.  http://gtsc.exer.us/111   Copyright  VHI. All rights reserved.   Leg Flexion    Inhale. While exhaling, lift right ankle toward buttocks, keeping knees together. Slowly return to starting position. Repeat 10 times each leg.  Do 1 session per day.  Copyright  VHI. All rights reserved.

## 2015-05-27 ENCOUNTER — Ambulatory Visit: Payer: Medicare Other | Admitting: Physical Therapy

## 2015-05-29 ENCOUNTER — Ambulatory Visit: Payer: Medicare Other | Admitting: Physical Therapy

## 2015-05-29 DIAGNOSIS — R2689 Other abnormalities of gait and mobility: Secondary | ICD-10-CM

## 2015-05-29 NOTE — Therapy (Signed)
[facility] [address] [place], [place], [address] Phone: [phone]   Fax:  [phone]  Physical Therapy Treatment  Patient Details  Name: Karla Nelson MRN: [id number] Date of Birth: March 26, 1965 Referring Provider: Dr. Berenice Primas  Encounter Date: 05/29/2015      PT End of Session - 05/29/15 0914    Visit Number 4   Number of Visits 9   Date for PT Re-Evaluation 06/01/15   Authorization Type Medicare primary (G-code and progress note every 10th visit). Medicaid secondary.   Authorization - Visit Number 3   Authorization - Number of Visits 3  Medicaid limit as secondary insurance   PT Start Time [phone]   PT Stop Time [phone]   PT Time Calculation (min) 42 min   Activity Tolerance Patient tolerated treatment well   Behavior During Therapy [facility] for tasks assessed/performed      Past Medical History  Diagnosis Date  . HTN (hypertension)     Has normal renal arteries.  . Cleft palate     special denture covers defect like a C, sleeps with device in place  . NSTEMI (non-ST elevated myocardial infarction) ([place]) 09-25-2010  . DYSPNEA ON EXERTION 03/08/2007  . ELECTROCARDIOGRAM, ABNORMAL 02/13/2010  . CAD (coronary artery disease)     NSTEMI with cath in August 2012 with nonobstructive disease, 50% ostial D1 and 70% distal LCX and PL disease. Normal EF. Has diastolic dysfunction with elevated EDP  . Tobacco abuse disorder   . Hyperlipidemia   . Asthma   . Stroke [facility])     july 2016, Dr Donnella Sham  . COPD (chronic obstructive pulmonary disease) (HCC)     o2 and bedtime   . History of oxygen administration     bedtime    Past Surgical History  Procedure Laterality Date  . Cesarean section    . Cardiac catheterization  Aug 2012    Moderate nonobstructive multivessel CAD with an EF of 70 to 75%  . Coronary angioplasty      no further intervention last cardiac visit greater than a year    There were no vitals filed  for this visit.    Prone hamstring curl on R with no resistance x 10 and on left with yellow band x 10. Seated hip flexion bil x 10 with yellow theraband. Seated hamstring curl on R x 10 with yellow theraband. Seated R dorsifleixon with yellow theraband x 10 Standing bil hip extension, flexion and abduction with yellow theraband x 10 Standing hamstring curl on R with no band and L with yellow band x 10 Standing R knee terminal extension x 10 with yellow theraband Forward step ups onto 6" step with bil LE's x 10 with cues to control R knee to prevent hyperextension.  Pt able to control with cues.  Updated pt's HEP.       PT Education - 05/29/15 0911    Education provided Yes   Education Details Adding theraband to HEP, Medicaid limit of 3 visits and to [employer] coordinator if questions, calling back if need to cancel remaining appointments   Person(s) Educated Patient   Methods Explanation;Demonstration;Handout   Comprehension Verbalized understanding;Returned demonstration          PT Short Term Goals - 05/02/15 1634    PT SHORT TERM GOAL #1   Title same as LTGs           PT Long Term Goals - 05/16/15 1644    PT LONG TERM  GOAL #1   Title Pt will be IND in HEP to improve balance, strength, and flexibility. Target date: 05/30/15   Baseline No HEP   Status On-going   PT LONG TERM GOAL #2   Title Pt will improve BERG score to >/=51/56 to decr. falls risk. Target date: 05/30/15   Baseline 46/56   Status On-going   PT LONG TERM GOAL #3   Title Pt will improve gait speed to >/=2.56ft/sec. without AD to safely amb. in the community. Target date: 05/30/15   Baseline 2.85ft/sec no AD   Status On-going   PT LONG TERM GOAL #4   Title Pt will improve SIS-mobility score to 77% to improve quality of life. Target date: 05/30/15   Baseline 61.6%   Status On-going   PT LONG TERM GOAL #5   Title Pt will amb. 600' over even/uneven terrain, IND, to improve functional mobility  and to perform yard work. Target date: 05/30/15   Baseline 35' with no AD, with S   Status On-going   PT LONG TERM GOAL #6   Title Assess stairs and write goal. Target date: 05/30/15. Pt will traverse 8 steps with one handrail at MOD I level to improve functional mobility. Target date: 05/30/15   Baseline 8 steps with S and one handrail.   Status Revised               Plan - 05/29/15 0914    Clinical Impression Statement Pt continues to be motivated to increase mobility and strength.  Able to perform HEP as provided.  Pt has financial concerns  over Medicaid visit limit as secondary insurance.  Pt to contact Lattie Haw Long to discuss patients responsibility without medicaid as secondary.  Pt has one more scheduled appointment and will call to cancel if she decides not to pursue continued PT sessions.     Rehab Potential Good   Clinical Impairments Affecting Rehab Potential co-morbidities   PT Frequency 2x / week  reduced to 1x/week for 3 weeks per pt request 2/2 insurance limiting visits   PT Duration 4 weeks   PT Treatment/Interventions ADLs/Self Care Home Management;Biofeedback;Canalith Repostioning;Balance training;Therapeutic exercise;Vestibular;Manual techniques;Therapeutic activities;Functional mobility training;Orthotic Fit/Training;Stair training;Gait training;DME Instruction;Neuromuscular re-education   PT Next Visit Plan Continue toward goals if pt returns   PT Home Exercise Plan Balance, strength, stretches HEP   Consulted and Agree with Plan of Care Patient      Patient will benefit from skilled therapeutic intervention in order to improve the following deficits and impairments:  Abnormal gait, Postural dysfunction, Impaired flexibility, Decreased coordination, Difficulty walking, Decreased balance, Decreased mobility, Decreased knowledge of use of DME, Decreased strength, Pain  Visit Diagnosis: Other abnormalities of gait and mobility     Problem List Patient Active  Problem List   Diagnosis Date Noted  . Spastic hemiplegia affecting nondominant side ([place]) 03/26/2015  . Smoking 12/30/2014  . Chronic obstructive pulmonary disease ([place]) 12/30/2014  . Lacunar infarct, acute ([place]) 12/04/2014  . Acute ischemic stroke ([place]) 09/01/2014  . Stroke ([place]) 09/01/2014  . Right sided weakness   . Smoker 03/15/2014  . History of cocaine use 03/15/2014  . Asthma, moderate persistent 07/26/2013  . 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 Acute sinusitis 04/11/2013  . NSTEMI (non-ST elevated myocardial infarction) ([place]) 06/19/2012  . Acute-on-chronic kidney injury ([place]) 06/19/2012  . Community acquired pneumonia 06/16/2012  . COPD exacerbation ([place]) 12/05/2011  . COPD (chronic obstructive pulmonary disease) ([place]) 07/12/2011  . CAD (coronary artery disease) 01/13/2011  . Tobacco  abuse 01/13/2011  . Mixed hyperlipidemia 10/13/2010  . ELECTROCARDIOGRAM, ABNORMAL 02/13/2010  . CLEFT PALATE 02/12/2010  . Essential hypertension 03/08/2007  . DYSPNEA ON EXERTION 03/08/2007    Narda Bonds 05/29/2015, 9:26 AM  [facility] [address] [place], [place], [address] Phone: [phone]   Fax:  [phone]  Name: Karla Nelson MRN: [id number] Date of Birth: 11-14-1965    Narda Bonds, [place] 05/29/2015 9:26 AM Phone: [phone] Fax: [phone]

## 2015-05-29 NOTE — Patient Instructions (Addendum)
ANKLE: Dorsiflexion (Band)    Sit at edge of surface. Start without band then when this gets to easy you can use yellow band.  Place yellow band around top of foot. Keeping heel on floor, raise toes of banded foot.  Use yellow band. 10 reps per set, 2 sets per day, 2 times a day  Copyright  VHI. All rights reserved.   FLEXION: Sitting - Resistance Band (Active)    Sit, both feet flat with band around both knees. Against red resistance band, lift right knee toward ceiling. Complete 2 sets of 10 repetitions. Perform 1 sessions per day.  http://gtsc.exer.us/21   Copyright  VHI. All rights reserved.     Hamstrings    Lie on stomach with no weight on right ankle. Bend same knee 90 degrees, pointing toes toward knee. Do not bend hips. Hold 2-3 seconds. Repeat 10 times. Do 1 sessions per day.  As you get stronger and can perform on the right side easy, you can add yellow band around ankle.   CAUTION: Move slowly.  Copyright  VHI. All rights reserved.   HIP / KNEE: Extension - Standing    Place yellow band around both ankles.  Squeeze glutes. Raise and lift right leg backward. Keep knee straight or slightly bent. 10 reps per set, one set per day. Hold onto a support.  Copyright  VHI. All rights reserved.   "I love a Parade" Lift    Place yellow band around both ankles.  Using a chair if necessary, march in place with right leg as high as you can. Repeat 10 times. Do 1 sessions per day.  http://gt2.exer.us/345   Copyright  VHI. All rights reserved.   ABDUCTION: Standing (Active)    Place yellow band around both ankles.  Stand, feet flat. Lift right leg out to side.  Complete 10 repetitions. Perform 1 sessions per day.  http://gtsc.exer.us/111   Copyright  VHI. All rights reserved.   Leg Flexion    Inhale. While exhaling, lift right ankle toward buttocks, keeping knees together. Slowly return to starting position. Repeat 10 times each leg. Do 1  session per day.  As you get stronger, you can place yellow band around both ankles and perform the above exercise.  Copyright  VHI. All rights reserved.   FUNCTIONAL MOBILITY: Step Up / Step Down    Step up, leading with left leg. Step down, leading with left leg. 10 reps per set, 2 sets per day.  Repeat leading with other leg. Hold onto handrail for support.  Copyright  VHI. All rights reserved.

## 2015-06-03 ENCOUNTER — Ambulatory Visit: Payer: Medicare Other

## 2015-06-04 ENCOUNTER — Ambulatory Visit (INDEPENDENT_AMBULATORY_CARE_PROVIDER_SITE_OTHER): Payer: Medicare Other | Admitting: Neurology

## 2015-06-04 ENCOUNTER — Encounter: Payer: Self-pay | Admitting: Neurology

## 2015-06-04 VITALS — BP 146/87 | HR 74 | Ht 65.0 in | Wt 147.8 lb

## 2015-06-04 DIAGNOSIS — G8110 Spastic hemiplegia affecting unspecified side: Secondary | ICD-10-CM | POA: Diagnosis not present

## 2015-06-04 NOTE — Progress Notes (Signed)
[facility] [address] [place]. [place] [address] [phone]       OFFICE FOLLOW-UP NOTE  Karla. Karla Nelson Date of Birth:  1966-01-02 Medical Record Number:  [id number]   HPI: Karla Nelson is a 73 year Caucasian lady who seen today for the first office follow-up visit following hospital admission for stroke in July 2016.Karla Nelson is a 50 y.o. female hx of HTN, NSTEMI, HLD, and cocaine usage presenting with 3 day history of right sided weakness. Notes symptoms started after an argument with her mother in law. Started in her RUE and now also involves RLE. Denies any sensory deficits. No visual or speech deficits.  MRI brain imaging  Personally reviewed, showed acute infarct in left posterior limb of internal capsule.  Date last known well: 08/29/2014 Time last known well: unclear tPA Given: no, outside tPA window Modified Rankin: Rankin Score=0. LDL cholesterol was elevated at 113 mg percent and hemoglobin A1c was 6.9. Transthoracic echo showed normal ejection fraction and carotid ultrasound showed no significant extra-axial stenosis. 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 Patient was counseled to quit smoking and drugs and started on aspirin. She states she's done well. Her right-sided weakness has improved only intermittently she will drag her right leg. She's been suffering from a cold for the last few days and plans to see her primary care physician for that soon. She continues to smoke and has not yet quit smoking but wants to discuss smoking cessation options with her primary physician. She is back to work and works part-time but has to work at the [employer]. Blood pressure is well controlled and today it is 144/83. She is tolerating Lipitor without myalgias or arthralgias. Update 03/26/2015 : She returns for follow-up after last visit 3 months ago. She has been complaining of increasing spasms in her right leg and cramps. She initially felt this was  related to aching potassium which she has discontinued but trouble walking, leg cramps and spasms continue. She was started by her primary physician and Zanaflex but she takes it only once a day and it doesn't work as well. She was seen in the emergency room and had a CT scan of the head last month which showed no acute abnormality and old left basal ganglia infarct. She was unable to return to work at [employer] as she had to be on her feet all day and she had trouble walking with that. She states she's lost about 20 pounds and is eating healthy. She remains on aspirin which is tolerating well. She has not yet quit smoking but plans to do so. She states her blood pressure is under good control though it is elevated today at 153/87. Update 06/04/2015: Patient is seen today for follow-up after last visit 2 months ago. This appointment had previously been made a year ago and was not canceled. She states she continues to do well from stroke standpoint without recurrent neurovascular symptoms. She has developed a stye in the left eye for the last 3 days and numbness using some eyedrops. She continues to have right leg spasms and pain. She has seen Dr. Posey Pronto from neuro rehabilitation who has started baclofen and plans to use Botox next week. She does do stretching of her leg but didn't help as much. She has successfully quit smoking 1 month ago. She is tolerating aspirin without bleeding or bruising. She states her blood pressure is usually quite good though it is elevated at 146/87 in office today.  ROS:   14 system review of systems is positive for As documented in HPI and all other systems negative  PMH:  Past Medical History  Diagnosis Date  . HTN (hypertension)     Has normal renal arteries.  . Cleft palate     special denture covers defect like a C, sleeps with device in place  . NSTEMI (non-ST elevated myocardial infarction) ([place]) 09-25-2010  . DYSPNEA ON EXERTION 03/08/2007  . ELECTROCARDIOGRAM,  ABNORMAL 02/13/2010  . CAD (coronary artery disease)     NSTEMI with cath in August 2012 with nonobstructive disease, 50% ostial D1 and 70% distal LCX and PL disease. Normal EF. Has diastolic dysfunction with elevated EDP  . Tobacco abuse disorder   . Hyperlipidemia   . Asthma   . Stroke [facility])     july 2016, Dr Donnella Sham  . COPD (chronic obstructive pulmonary disease) (H[facility])     o2 and bedtime   . History of oxygen administration     bedtime    Social History:  Social History   Social History  . Marital Status: Single    Spouse Name: N/A  . Number of Children: N/A  . Years of Education: N/A   Occupational History  . Not on file.   Social History Main Topics  . Smoking status: Former Smoker -- 0.50 packs/day for 31 years    Types: Cigarettes    Quit date: 04/18/2015  . Smokeless tobacco: Never Used  . Alcohol Use: No  . Drug Use: No     Comment: former drug use  . Sexual Activity: Yes   Other Topics Concern  . Not on file   Social History Narrative    Medications:   Current Outpatient Prescriptions on File Prior to Visit  Medication Sig Dispense Refill  . amLODipine (NORVASC) 10 MG tablet Take 10 mg by mouth at bedtime.  12  . aspirin 325 MG tablet Take 1 tablet (325 mg total) by mouth daily.    Marland Kitchen atorvastatin (LIPITOR) 20 MG tablet Take 1 tablet (20 mg total) by mouth daily at 6 PM. 30 tablet 1  . baclofen (LIORESAL) 10 MG tablet Take 1 tablet (10 mg total) by mouth 3 (three) times daily. Take 5mg  TID for 3 days and increase to 10mg  TID if tolerating 30 each 1  . hydrALAZINE (APRESOLINE) 100 MG tablet TAKE 1 TABLET (100 MG TOTAL) BY MOUTH 3 (THREE) TIMES DAILY. 90 tablet 1  . labetalol (NORMODYNE) 300 MG tablet Take 300 mg by mouth 2 (two) times daily.    Marland Kitchen METHADONE HCL PO Take 20 mg by mouth daily.     . Multiple Vitamin (MULTIVITAMIN WITH MINERALS) TABS tablet Take 1 tablet by mouth daily.    . nitroGLYCERIN (NITROSTAT) 0.4 MG SL tablet Place 0.4 mg under the  tongue every 5 (five) minutes as needed for chest pain.    . Omega-3 Fatty Acids (FISH OIL PO) Take 1 capsule by mouth every morning.    . OXYGEN Inhale into the lungs. At night     No current facility-administered medications on file prior to visit.    Allergies:  No Known Allergies  Physical Exam General: pleasant middle-aged African-American lady, seated, in no evident distress Head: head normocephalic and atraumatic.  Neck: supple with no carotid or supraclavicular bruits Cardiovascular: regular rate and rhythm, no murmurs Musculoskeletal: no deformity Skin:  no rash/petichiae Vascular:  Normal pulses all extremities Respiratory : Scattered rhonchi bilaterally Filed Vitals:   06/04/15 1014  BP: 146/87  Pulse: 74   Neurologic Exam Mental Status: Awake and fully alert. Oriented to place and time. Recent and remote memory intact. Attention span, concentration and fund of knowledge appropriate. Mood and affect appropriate.  Cranial Nerves: Fundoscopic exam  not done. Pupils equal, briskly reactive to light. Extraocular movements full without nystagmus. Visual fields full to confrontation. Hearing intact. Facial sensation intact. Face, tongue, palate moves normally and symmetrically.  Motor: Normal bulk and tone. Normal strength in all tested extremity muscles. Diminished fine finger movements on the right. Orbits left over right upper extremity. Mild 4/5 weakness in the right lower extremities with right mild ankle foot drop Sensory.: intact to touch ,pinprick .position and vibratory sensation.  Coordination: Intact in the upper extremities. Impaired in the right lower extremity.  Gait and Station: Arises from chair without difficulty. Stance is normal. Gait demonstrates right leg spasticity and dragging  Reflexes: 2+ and asymmetric brisker on right. Toes downgoing.       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 Right leg spasms and difficulty walking due to post stroke spasticity    PLAN: I had a long d/w patient about her recent stroke, risk for recurrent stroke/TIAs,  and answered questions.Continue aspirin 325 mg daily  for secondary stroke prevention and maintain strict control of hypertension with blood pressure goal below 130/90,  lipids with LDL cholesterol goal below 70 mg/dL. Continue baclofen for spasticity and follow-up with Dr.Ankit Posey Pronto for Botox I also advised the patient to eat a healthy diet with plenty of whole grains, cereals, fruits and vegetables, exercise regularly and maintain ideal body weight Followup in the future with Cecille Rubin nurse practitioner in 6 months or call earlier if necessary.  Antony Contras, MD Note: This document was prepared with digital dictation and possible smart phrase technology. Any transcriptional errors that result from this process are unintentional

## 2015-06-04 NOTE — Patient Instructions (Signed)
I had a long d/w patient about her recent stroke, risk for recurrent stroke/TIAs,  and answered questions.Continue aspirin 325 mg daily  for secondary stroke prevention and maintain strict control of hypertension with blood pressure goal below 130/90,  lipids with LDL cholesterol goal below 70 mg/dL. Continue baclofen for spasticity and follow-up per Dr.Ankit Patel for Botox I also advised the patient to eat a healthy diet with plenty of whole grains, cereals, fruits and vegetables, exercise regularly and maintain ideal body weight Followup in the future with Cecille Rubin nurse practitioner in 6 months or call earlier if necessary.

## 2015-06-05 ENCOUNTER — Encounter: Payer: Medicare Other | Attending: Physical Medicine & Rehabilitation | Admitting: Physical Medicine & Rehabilitation

## 2015-06-05 ENCOUNTER — Encounter: Payer: Self-pay | Admitting: Physical Medicine & Rehabilitation

## 2015-06-05 VITALS — BP 162/98 | HR 70 | Resp 14

## 2015-06-05 DIAGNOSIS — I2510 Atherosclerotic heart disease of native coronary artery without angina pectoris: Secondary | ICD-10-CM | POA: Insufficient documentation

## 2015-06-05 DIAGNOSIS — Z8673 Personal history of transient ischemic attack (TIA), and cerebral infarction without residual deficits: Secondary | ICD-10-CM | POA: Insufficient documentation

## 2015-06-05 DIAGNOSIS — F17210 Nicotine dependence, cigarettes, uncomplicated: Secondary | ICD-10-CM | POA: Diagnosis not present

## 2015-06-05 DIAGNOSIS — M79604 Pain in right leg: Secondary | ICD-10-CM | POA: Insufficient documentation

## 2015-06-05 DIAGNOSIS — Z9981 Dependence on supplemental oxygen: Secondary | ICD-10-CM | POA: Insufficient documentation

## 2015-06-05 DIAGNOSIS — E785 Hyperlipidemia, unspecified: Secondary | ICD-10-CM | POA: Diagnosis not present

## 2015-06-05 DIAGNOSIS — I10 Essential (primary) hypertension: Secondary | ICD-10-CM | POA: Diagnosis not present

## 2015-06-05 DIAGNOSIS — I252 Old myocardial infarction: Secondary | ICD-10-CM | POA: Insufficient documentation

## 2015-06-05 DIAGNOSIS — G8111 Spastic hemiplegia affecting right dominant side: Secondary | ICD-10-CM | POA: Diagnosis not present

## 2015-06-05 DIAGNOSIS — J449 Chronic obstructive pulmonary disease, unspecified: Secondary | ICD-10-CM | POA: Insufficient documentation

## 2015-06-05 DIAGNOSIS — Z7982 Long term (current) use of aspirin: Secondary | ICD-10-CM | POA: Insufficient documentation

## 2015-06-05 DIAGNOSIS — J45909 Unspecified asthma, uncomplicated: Secondary | ICD-10-CM | POA: Insufficient documentation

## 2015-06-05 NOTE — Progress Notes (Signed)
Botox: Procedure Note Patient Name: Karla Nelson DOB: [birth year] MRN: [id number]   Procedure: Botulinum toxin administration Guidance: EMG Diagnosis: Spastic hemiplegia dominant side,  G81.11 Date: 06/05/15 Attending: Delice Lesch, MD   Informed consent: Risks, benefits & options of the procedure are explained to the patient (and/or family). The patient elects to proceed with procedure. Risks include but are not limited to weakness, respiratory distress, dry mouth, ptosis, antibody formation, worsening of some areas of function. Benefits include decreased abnormal muscle tone, improved hygiene and positioning, decreased skin breakdown and, in some cases, decreased pain. Options include conservative management with oral antispasticity agents, phenol chemodenervation of nerve or at motor nerve branches. More invasive options include intrathecal balcofen adminstration for appropriate candidates. Surgical options may include tendon lengthening or transposition or, rarely, dorsal rhizotomy.   History/Physical Examination: 50 y.o. female with difficulty in ambulation due to right lower extremity spasticity. She has tried Zanaflex with limited benefit, she also tried baclofen, which helped a little but not significant family.   mAS 1+/4 right ankle plantar flexors, however functional spasticity significantly more  Gait: With circumduction of right lower extremity Previous Treatments: Therapy/Range of motion Indication for guidance: Target active muscules  Procedure: Botulinum toxin was mixed with preservative free saline with a dilution of 1cc to 100 units. Targeted limb and muscles were identified. The skin was prepped with alcohol swabs and placement of needle tip in targeted muscle was confirmed using appropriate guidance. Prior to injection, positioning of needle tip outside of blood vessel was determined by pulling back on syringe plunger.  MUSCLE UNITS Right medial gastroc 30 units Right  lateral gastroc 20 units   Total units used: 50 units 50 units discarded due to unavoidable waste  Complications: None Plan:  Continue therapies  DC baclofen  Will see patient in 6 weeks for reevaluation of Botox efficacy  Jazen Spraggins Lorie Phenix 06/05/15 10:53 AM

## 2015-06-06 ENCOUNTER — Ambulatory Visit: Payer: Medicare Other

## 2015-06-09 ENCOUNTER — Encounter (HOSPITAL_COMMUNITY): Payer: Self-pay | Admitting: *Deleted

## 2015-06-09 ENCOUNTER — Ambulatory Visit (HOSPITAL_COMMUNITY)
Admission: EM | Admit: 2015-06-09 | Discharge: 2015-06-09 | Disposition: A | Discharge status: Home / Self Care | Payer: Medicare Other | Attending: Family Medicine | Admitting: Family Medicine

## 2015-06-09 DIAGNOSIS — H109 Unspecified conjunctivitis: Secondary | ICD-10-CM

## 2015-06-09 MED ORDER — AZITHROMYCIN 250 MG PO TABS: ORAL_TABLET | ORAL | Status: DC

## 2015-06-09 MED ORDER — TOBRAMYCIN 0.3 % OP SOLN: 1.0000 [drp] | Freq: Four times a day (QID) | OPHTHALMIC | Status: DC

## 2015-06-09 NOTE — ED Notes (Signed)
Pt  Reports  Symptoms  Of  Bilateral   Eye  Redness  /  Irritation   Symptoms  For  Several days     Pt  Sitting  Upright  On the  Exam table  In no  Acute  /  Severe  Distress

## 2015-06-09 NOTE — ED Provider Notes (Signed)
CSN: [id number]     Arrival date & time 06/09/15  1753 History   First MD Initiated Contact with Patient 06/09/15 1856     Chief Complaint  Patient presents with  . Eye Problem   (Consider location/radiation/quality/duration/timing/severity/associated sxs/prior Treatment) Patient is a 50 y.o. female presenting with eye problem. The history is provided by the patient.  Eye Problem Location:  Both Quality:  Tearing and aching Severity:  Moderate Onset quality:  Gradual Duration:  4 days Progression:  Worsening Chronicity:  New Context: not foreign body and not scratch   Relieved by:  None tried Worsened by:  Nothing tried Ineffective treatments:  None tried Associated symptoms: crusting, discharge, redness, swelling and tearing   Associated symptoms: no blurred vision, no decreased vision, no double vision and no photophobia   Risk factors: recent URI     Past Medical History  Diagnosis Date  . HTN (hypertension)     Has normal renal arteries.  . Cleft palate     special denture covers defect like a C, sleeps with device in place  . NSTEMI (non-ST elevated myocardial infarction) ([place]) 09-25-2010  . DYSPNEA ON EXERTION 03/08/2007  . ELECTROCARDIOGRAM, ABNORMAL 02/13/2010  . CAD (coronary artery disease)     NSTEMI with cath in August 2012 with nonobstructive disease, 50% ostial D1 and 70% distal LCX and PL disease. Normal EF. Has diastolic dysfunction with elevated EDP  . Tobacco abuse disorder   . Hyperlipidemia   . Asthma   . Stroke [facility])     july 2016, Dr Donnella Sham  . COPD (chronic obstructive pulmonary disease) (HCC)     o2 and bedtime   . History of oxygen administration     bedtime   Past Surgical History  Procedure Laterality Date  . Cesarean section    . Cardiac catheterization  Aug 2012    Moderate nonobstructive multivessel CAD with an EF of 70 to 75%  . Coronary angioplasty      no further intervention last cardiac visit greater than a year   Family History   Problem Relation Age of Onset  . Diabetes    . Hypertension    . Asthma Mother   . Heart disease Mother   . Hypertension Mother   . Asthma Brother    Social History  Substance Use Topics  . Smoking status: Former Smoker -- 0.50 packs/day for 31 years    Types: Cigarettes    Quit date: 04/18/2015  . Smokeless tobacco: Never Used  . Alcohol Use: No   OB History    Gravida Para Term Preterm AB TAB SAB Ectopic Multiple Living            1     Review of Systems  Constitutional: Negative for fever.  HENT: Positive for congestion and postnasal drip.   Eyes: Positive for discharge and redness. Negative for blurred vision, double vision, photophobia, pain and visual disturbance.  Respiratory: Negative.     Allergies  Review of patient's allergies indicates no known allergies.  Home Medications   Prior to Admission medications   Medication Sig Start Date End Date Taking? Authorizing Provider  amLODipine (NORVASC) 10 MG tablet Take 10 mg by mouth at bedtime. 08/09/14   Historical Provider, MD  aspirin 325 MG tablet Take 1 tablet (325 mg total) by mouth daily. 09/02/14   Delfina Redwood, MD  atorvastatin (LIPITOR) 20 MG tablet Take 1 tablet (20 mg total) by mouth daily at 6 PM. 09/02/14  Delfina Redwood, MD  azithromycin (ZITHROMAX Z-PAK) 250 MG tablet Take as directed on pack 06/09/15   Billy Fischer, MD  hydrALAZINE (APRESOLINE) 100 MG tablet TAKE 1 TABLET (100 MG TOTAL) BY MOUTH 3 (THREE) TIMES DAILY. 09/04/14   Burtis Junes, NP  labetalol (NORMODYNE) 300 MG tablet Take 300 mg by mouth 2 (two) times daily.    Historical Provider, MD  METHADONE HCL PO Take 20 mg by mouth daily.     Historical Provider, MD  Multiple Vitamin (MULTIVITAMIN WITH MINERALS) TABS tablet Take 1 tablet by mouth daily.    Historical Provider, MD  nitroGLYCERIN (NITROSTAT) 0.4 MG SL tablet Place 0.4 mg under the tongue every 5 (five) minutes as needed for chest pain.    Historical Provider, MD  Omega-3  Fatty Acids (FISH OIL PO) Take 1 capsule by mouth every morning.    Historical Provider, MD  OXYGEN Inhale into the lungs. At night    Historical Provider, MD  tobramycin (TOBREX) 0.3 % ophthalmic solution Place 1 drop into both eyes every 6 (six) hours. 06/09/15   Billy Fischer, MD   Meds Ordered and Administered this Visit  Medications - No data to display  BP 190/88 mmHg  Pulse 78  Temp(Src) 98.3 F (36.8 C) (Oral)  Resp 16  SpO2 95%  LMP 09/09/2014 (Approximate) No data found.   Physical Exam  Constitutional: She is oriented to person, place, and time. She appears well-developed and well-nourished. No distress.  HENT:  Head: Normocephalic.  Right Ear: External ear normal.  Left Ear: External ear normal.  Mouth/Throat: Oropharynx is clear and moist.  Eyes: EOM are normal. Pupils are equal, round, and reactive to light. Right eye exhibits discharge. Left eye exhibits discharge. Right conjunctiva is injected. Left conjunctiva is injected.  Neck: Normal range of motion. Neck supple.  Lymphadenopathy:    She has no cervical adenopathy.  Neurological: She is alert and oriented to person, place, and time.  Skin: Skin is warm and dry.  Nursing note and vitals reviewed.   ED Course  Procedures (including critical care time)  Labs Review Labs Reviewed - No data to display  Imaging Review No results found.   Visual Acuity Review  Right Eye Distance:   Left Eye Distance:   Bilateral Distance:    Right Eye Near:   Left Eye Near:    Bilateral Near:         MDM   1. Bilateral conjunctivitis        Billy Fischer, MD 06/09/15 [place]

## 2015-06-24 NOTE — Therapy (Signed)
[facility] [address] [place], [place], [address] Phone: [phone]   Fax:  [phone]  Patient Details  Name: Karla Nelson MRN: [id number] Date of Birth: September 03, 1965 Referring Provider:  No ref. provider found  Encounter Date: Jun 27, 2015  PHYSICAL THERAPY DISCHARGE SUMMARY  Visits from Start of Care: 4  Current functional level related to goals / functional outcomes:     PT Long Term Goals - 05/16/15 1644    PT LONG TERM GOAL #1   Title Pt will be IND in HEP to improve balance, strength, and flexibility. Target date: 05/30/15   Baseline No HEP   Status On-going   PT LONG TERM GOAL #2   Title Pt will improve BERG score to >/=51/56 to decr. falls risk. Target date: 05/30/15   Baseline 46/56   Status On-going   PT LONG TERM GOAL #3   Title Pt will improve gait speed to >/=2.48f/sec. without AD to safely amb. in the community. Target date: 05/30/15   Baseline 2.343fsec no AD   Status On-going   PT LONG TERM GOAL #4   Title Pt will improve SIS-mobility score to 77% to improve quality of life. Target date: 05/30/15   Baseline 61.6%   Status On-going   PT LONG TERM GOAL #5   Title Pt will amb. 600' over even/uneven terrain, IND, to improve functional mobility and to perform yard work. Target date: 05/30/15   Baseline 7574with no AD, with S   Status On-going   PT LONG TERM GOAL #6   Title Assess stairs and write goal. Target date: 05/30/15. Pt will traverse 8 steps with one handrail at MOD I level to improve functional mobility. Target date: 05/30/15   Baseline 8 steps with S and one handrail.   Status Revised        Remaining deficits: Unknown, as pt did not return since last visit, as she reported she was experiencing financial difficultites.   Education / Equipment: HEP  Plan: Patient agrees to discharge.  Patient goals were not met. Patient is being discharged due to not returning since the last visit.   ?????       G-Codes - 0505/19/2017129    Functional Assessment Tool Used BERG: 46/56; gait speed no AD: 2.33108fec, TUG no AD: 11.1sec. (same as eval, as pt did not return)   Functional Limitation Mobility: Walking and moving around   Mobility: Walking and Moving Around Goal Status ([id number] least 20 percent but less than 40 percent impaired, limited or restricted   Mobility: Walking and Moving Around Discharge Status ([phone] least 20 percent but less than 40 percent impaired, limited or restricted           Miller,Jennifer L 5/1May 19, 20171:30 AM  [facility]one: [phone]:  [phone]       JenGeoffry ParadiseT,DPT 05/Jun 27, 2015:31 AM Phone: [phone]: [phone]

## 2015-07-09 ENCOUNTER — Encounter: Payer: Medicare Other | Admitting: Physical Medicine & Rehabilitation

## 2015-07-10 ENCOUNTER — Encounter: Payer: Self-pay | Admitting: Physical Medicine & Rehabilitation

## 2015-07-10 ENCOUNTER — Encounter: Payer: Medicare Other | Attending: Physical Medicine & Rehabilitation | Admitting: Physical Medicine & Rehabilitation

## 2015-07-10 VITALS — BP 165/92 | HR 78 | Resp 14

## 2015-07-10 DIAGNOSIS — G8111 Spastic hemiplegia affecting right dominant side: Secondary | ICD-10-CM

## 2015-07-10 DIAGNOSIS — F17210 Nicotine dependence, cigarettes, uncomplicated: Secondary | ICD-10-CM | POA: Insufficient documentation

## 2015-07-10 DIAGNOSIS — Z8673 Personal history of transient ischemic attack (TIA), and cerebral infarction without residual deficits: Secondary | ICD-10-CM | POA: Diagnosis not present

## 2015-07-10 DIAGNOSIS — M79604 Pain in right leg: Secondary | ICD-10-CM | POA: Insufficient documentation

## 2015-07-10 DIAGNOSIS — I10 Essential (primary) hypertension: Secondary | ICD-10-CM | POA: Diagnosis not present

## 2015-07-10 DIAGNOSIS — J45909 Unspecified asthma, uncomplicated: Secondary | ICD-10-CM | POA: Diagnosis not present

## 2015-07-10 DIAGNOSIS — Z9981 Dependence on supplemental oxygen: Secondary | ICD-10-CM | POA: Insufficient documentation

## 2015-07-10 DIAGNOSIS — I252 Old myocardial infarction: Secondary | ICD-10-CM | POA: Insufficient documentation

## 2015-07-10 DIAGNOSIS — J449 Chronic obstructive pulmonary disease, unspecified: Secondary | ICD-10-CM | POA: Insufficient documentation

## 2015-07-10 DIAGNOSIS — Z7982 Long term (current) use of aspirin: Secondary | ICD-10-CM | POA: Diagnosis not present

## 2015-07-10 DIAGNOSIS — E785 Hyperlipidemia, unspecified: Secondary | ICD-10-CM | POA: Diagnosis not present

## 2015-07-10 DIAGNOSIS — I2510 Atherosclerotic heart disease of native coronary artery without angina pectoris: Secondary | ICD-10-CM | POA: Diagnosis not present

## 2015-07-10 NOTE — Progress Notes (Signed)
Subjective:    Patient ID: Karla Nelson, female    DOB: 1965-05-09, 50 y.o.   MRN: [id number]  HPI 50 y/o with pmh of HTN, COPD presents for follow up after receiving botox injection for spasticity in her RLE.  Since her injection, she states improvement in ambulation and pain in her RLE.  She states at times it still feels tight, but overall she is dragger her foot much less.  Pain Inventory Average Pain 6 Pain Right Now 5 My pain is burning and tingling  In the last 24 hours, has pain interfered with the following? General activity 8 Relation with others 0 Enjoyment of life 10 What TIME of day is your pain at its worst? night Sleep (in general) Fair  Pain is worse with: walking and standing Pain improves with: pacing activities Relief from Meds: 2  Mobility walk without assistance how many minutes can you walk? 5-10 ability to climb steps?  yes do you drive?  yes Do you have any goals in this area?  no  Function Do you have any goals in this area?  no  Neuro/Psych spasms  Prior Studies Any changes since last visit?  yes  Physicians involved in your care Any changes since last visit?  no   Family History  Problem Relation Age of Onset  . Diabetes    . Hypertension    . Asthma Mother   . Heart disease Mother   . Hypertension Mother   . Asthma Brother    Social History   Social History  . Marital Status: Single    Spouse Name: N/A  . Number of Children: N/A  . Years of Education: N/A   Social History Main Topics  . Smoking status: Former Smoker -- 0.50 packs/day for 31 years    Types: Cigarettes    Quit date: 04/18/2015  . Smokeless tobacco: Never Used  . Alcohol Use: No  . Drug Use: No     Comment: former drug use  . Sexual Activity: Yes   Other Topics Concern  . None   Social History Narrative   Past Surgical History  Procedure Laterality Date  . Cesarean section    . Cardiac catheterization  Aug 2012    Moderate nonobstructive  multivessel CAD with an EF of 70 to 75%  . Coronary angioplasty      no further intervention last cardiac visit greater than a year   Past Medical History  Diagnosis Date  . HTN (hypertension)     Has normal renal arteries.  . Cleft palate     special denture covers defect like a C, sleeps with device in place  . NSTEMI (non-ST elevated myocardial infarction) ([place]) 09-25-2010  . DYSPNEA ON EXERTION 03/08/2007  . ELECTROCARDIOGRAM, ABNORMAL 02/13/2010  . CAD (coronary artery disease)     NSTEMI with cath in August 2012 with nonobstructive disease, 50% ostial D1 and 70% distal LCX and PL disease. Normal EF. Has diastolic dysfunction with elevated EDP  . Tobacco abuse disorder   . Hyperlipidemia   . Asthma   . Stroke [facility])     july 2016, Dr Donnella Sham  . COPD (chronic obstructive pulmonary disease) (HCC)     o2 and bedtime   . History of oxygen administration     bedtime   BP 165/92 mmHg  Pulse 78  Resp 14  SpO2 92%  LMP 09/09/2014 (Approximate)  Opioid Risk Score:   Fall Risk Score:  `1  Depression screen [facility] 2/9  Depression screen [facility] 2/9 11/01/2014 10/21/2014 10/03/2014 10/03/2014 09/26/2014  Decreased Interest 0 0 1 0 0  Down, Depressed, Hopeless 0 0 1 0 0  PHQ - 2 Score 0 0 2 0 0  Altered sleeping - - 1 - -  Tired, decreased energy - - 1 - -  Change in appetite - - 0 - -  Feeling bad or failure about yourself  - - 0 - -  Trouble concentrating - - 1 - -  Moving slowly or fidgety/restless - - 1 - -  Suicidal thoughts - - 0 - -  PHQ-9 Score - - 6 - -  Difficult doing work/chores - - Very difficult - -   Review of Systems  Endocrine:       High blood sugar   All other systems reviewed and are negative.     Objective:   Physical Exam HENT: Normocephalic, Atraumatic Eyes: EOMI, Conj WNL Cardio: S1, S2 normal, RRR Pulm: B/l clear to auscultation. Effort normal Abd: Soft, non-distended, non-tender, BS+ MSK: Gait with mild limp (?habitual).  No TTP.   No edema.  Neuro: CN II-XII grossly intact.  Sensation intact to light touch in all LE dermatomes Reflexes 3+ RLE Strength 5/5 in all LLE myotomes 4/5 RLE hip flexion, 4+/5 knee extension, 4/5 ankle dorsi/plantar flexion MAS: RLE 0/4 throughout, except for 1/4 plantar flexion (more functional, improved) Skin: Warm and Dry     Assessment & Plan:  50 y/o with pmh of HTN, COPD presents with pain and heaviness in her right leg for follow up after botox injection.  1. Right leg pain and heaviness secondary to spastic hemiplegia   D/c antispasticity meds as pt does not need these anymore  Cont heating pad Cont Yoga Will schedule repeat botox injection on next visit for plantar flexors.  Pt states improvement in gait, however, believes she can still improve her function.  Will increase dose to right medial gastcoc 40U, right lateral gastroc 40U  2. Abnormality of gait Cont cane for safety, pt using occasionally now

## 2015-09-02 ENCOUNTER — Ambulatory Visit
Admission: RE | Admit: 2015-09-02 | Discharge: 2015-09-02 | Disposition: A | Discharge status: Home / Self Care | Payer: Medicare Other | Source: Ambulatory Visit

## 2015-09-02 DIAGNOSIS — Z1231 Encounter for screening mammogram for malignant neoplasm of breast: Secondary | ICD-10-CM

## 2015-09-23 ENCOUNTER — Ambulatory Visit: Payer: Medicare Other | Admitting: Nurse Practitioner

## 2015-11-10 ENCOUNTER — Encounter (HOSPITAL_COMMUNITY): Payer: Self-pay | Admitting: Emergency Medicine

## 2015-11-10 ENCOUNTER — Ambulatory Visit (HOSPITAL_COMMUNITY)
Admission: EM | Admit: 2015-11-10 | Discharge: 2015-11-10 | Disposition: A | Discharge status: Home / Self Care | Payer: Medicare Other | Attending: Internal Medicine | Admitting: Internal Medicine

## 2015-11-10 DIAGNOSIS — L03114 Cellulitis of left upper limb: Secondary | ICD-10-CM | POA: Insufficient documentation

## 2015-11-10 DIAGNOSIS — Z87891 Personal history of nicotine dependence: Secondary | ICD-10-CM | POA: Insufficient documentation

## 2015-11-10 DIAGNOSIS — L0291 Cutaneous abscess, unspecified: Secondary | ICD-10-CM | POA: Diagnosis present

## 2015-11-10 DIAGNOSIS — L02414 Cutaneous abscess of left upper limb: Secondary | ICD-10-CM | POA: Diagnosis not present

## 2015-11-10 MED ORDER — CEPHALEXIN 500 MG PO CAPS: 500.0000 mg | ORAL_CAPSULE | Freq: Four times a day (QID) | ORAL | 0 refills | Status: DC

## 2015-11-10 MED ORDER — LIDOCAINE HCL (PF) 1 % IJ SOLN
INTRAMUSCULAR | Status: AC
  Filled 2015-11-10: qty 2

## 2015-11-10 MED ORDER — SULFAMETHOXAZOLE-TRIMETHOPRIM 800-160 MG PO TABS: 1.0000 | ORAL_TABLET | Freq: Two times a day (BID) | ORAL | 0 refills | Status: AC

## 2015-11-10 MED ORDER — LIDOCAINE HCL 2 % IJ SOLN
INTRAMUSCULAR | Status: AC
  Filled 2015-11-10: qty 20

## 2015-11-10 MED ORDER — CEFTRIAXONE SODIUM 1 G IJ SOLR
INTRAMUSCULAR | Status: AC
  Filled 2015-11-10: qty 10

## 2015-11-10 MED ORDER — HYDROCODONE-ACETAMINOPHEN 5-325 MG PO TABS: 1.0000 | ORAL_TABLET | ORAL | 0 refills | Status: DC | PRN

## 2015-11-10 MED ORDER — CEFTRIAXONE SODIUM 1 G IJ SOLR
1.0000 g | Freq: Once | INTRAMUSCULAR | Status: AC
  Administered 2015-11-10: 1 g via INTRAMUSCULAR

## 2015-11-10 NOTE — Discharge Instructions (Signed)
The left arm has a superficial abscess with surrounding infection called cellulitis. We are treating that with an injection of antibiotics today and to antibiotics that you need to start taking today as well. Apply warm compresses you have been to both areas of each arm. The incision that was made to your left arm should heal up in close within 2-3 days. Recommended he return in 2 days for a wound check to make sure you are getting better. If the redness, swelling and pain increases and especially if you develop a fever or start feeling sick you should seek medical attention promptly.

## 2015-11-10 NOTE — ED Provider Notes (Signed)
CSN: [id number]     Arrival date & time 11/10/15  1144 History   First MD Initiated Contact with Patient 11/10/15 1347     Chief Complaint  Patient presents with  . Abscess   (Consider location/radiation/quality/duration/timing/severity/associated sxs/prior Treatment) 50 year old female complaining of a boil on both her left and right forearm developing over the past 1-1/2 weeks. She has been applying heat in attempts to bring healing to it as well as an OTC paste.      Past Medical History:  Diagnosis Date  . Asthma   . CAD (coronary artery disease)    NSTEMI with cath in August 2012 with nonobstructive disease, 50% ostial D1 and 70% distal LCX and PL disease. Normal EF. Has diastolic dysfunction with elevated EDP  . Cleft palate    special denture covers defect like a C, sleeps with device in place  . COPD (chronic obstructive pulmonary disease) (HCC)    o2 and bedtime   . DYSPNEA ON EXERTION 03/08/2007  . ELECTROCARDIOGRAM, ABNORMAL 02/13/2010  . History of oxygen administration    bedtime  . HTN (hypertension)    Has normal renal arteries.  . Hyperlipidemia   . NSTEMI (non-ST elevated myocardial infarction) ([place]) 09-25-2010  . Stroke [facility])    july 2016, Dr Donnella Sham  . Tobacco abuse disorder    Past Surgical History:  Procedure Laterality Date  . CARDIAC CATHETERIZATION  Aug 2012   Moderate nonobstructive multivessel CAD with an EF of 70 to 75%  . CESAREAN SECTION    . CORONARY ANGIOPLASTY     no further intervention last cardiac visit greater than a year   Family History  Problem Relation Age of Onset  . Asthma Mother   . Heart disease Mother   . Hypertension Mother   . Asthma Brother   . Diabetes    . Hypertension     Social History  Substance Use Topics  . Smoking status: Former Smoker    Packs/day: 0.50    Years: 31.00    Types: Cigarettes    Quit date: 04/18/2015  . Smokeless tobacco: Never Used  . Alcohol use No   OB History    Gravida Para Term  Preterm AB Living             1   SAB TAB Ectopic Multiple Live Births                 Review of Systems  Constitutional: Negative.   HENT: Negative.   Respiratory: Negative.   Musculoskeletal: Negative.   Skin: Positive for color change. Negative for rash.       See history of present illness  Neurological: Negative.   All other systems reviewed and are negative.   Allergies  Review of patient's allergies indicates no known allergies.  Home Medications   Prior to Admission medications   Medication Sig Start Date End Date Taking? Authorizing Provider  amLODipine (NORVASC) 10 MG tablet Take 10 mg by mouth at bedtime. 08/09/14   Historical Provider, MD  aspirin 325 MG tablet Take 1 tablet (325 mg total) by mouth daily. 09/02/14   Delfina Redwood, MD  atorvastatin (LIPITOR) 20 MG tablet Take 1 tablet (20 mg total) by mouth daily at 6 PM. 09/02/14   Delfina Redwood, MD  cephALEXin (KEFLEX) 500 MG capsule Take 1 capsule (500 mg total) by mouth 4 (four) times daily. 11/10/15   Janne Napoleon, NP  hydrALAZINE (APRESOLINE) 100 MG tablet TAKE 1 TABLET (100  MG TOTAL) BY MOUTH 3 (THREE) TIMES DAILY. 09/04/14   Burtis Junes, NP  HYDROcodone-acetaminophen (NORCO/VICODIN) 5-325 MG tablet Take 1 tablet by mouth every 4 (four) hours as needed. 11/10/15   Janne Napoleon, NP  labetalol (NORMODYNE) 300 MG tablet Take 300 mg by mouth 2 (two) times daily.    Historical Provider, MD  METHADONE HCL PO Take 20 mg by mouth daily.     Historical Provider, MD  Multiple Vitamin (MULTIVITAMIN WITH MINERALS) TABS tablet Take 1 tablet by mouth daily.    Historical Provider, MD  nitroGLYCERIN (NITROSTAT) 0.4 MG SL tablet Place 0.4 mg under the tongue every 5 (five) minutes as needed for chest pain.    Historical Provider, MD  Omega-3 Fatty Acids (FISH OIL PO) Take 1 capsule by mouth every morning.    Historical Provider, MD  OXYGEN Inhale into the lungs. At night    Historical Provider, MD   sulfamethoxazole-trimethoprim (BACTRIM DS,SEPTRA DS) 800-160 MG tablet Take 1 tablet by mouth 2 (two) times daily. 11/10/15 11/17/15  Janne Napoleon, NP   Meds Ordered and Administered this Visit   Medications  cefTRIAXone (ROCEPHIN) injection 1 g (not administered)    BP 164/82 (BP Location: Right Arm)   Pulse 75   Temp 98.1 F (36.7 C) (Oral)   Resp 14   LMP 09/09/2014 (Approximate) Comment: none since august  SpO2 97%  No data found.   Physical Exam  Constitutional: She is oriented to person, place, and time. She appears well-developed and well-nourished. No distress.  Eyes: EOM are normal.  Neck: Normal range of motion. Neck supple.  Cardiovascular: Normal rate.   Pulmonary/Chest: Effort normal. No respiratory distress.  Musculoskeletal: Normal range of motion. She exhibits no edema.  Neurological: She is alert and oriented to person, place, and time. She exhibits normal muscle tone.  Skin: Skin is warm and dry. Capillary refill takes less than 2 seconds.  There is a 1-1/2 cm lesion to the right forearm older aspect with surrounding erythema. Minimal swelling and induration. No fluctuance.  The left forearm older aspect with approximately 2-1/2 cm area of fluctuance over a large area of induration and erythema that extends approximately 10 cm. This area is exquisitely tender. No current drainage.  Psychiatric: She has a normal mood and affect.  Nursing note and vitals reviewed.   Urgent Care Course   Clinical Course    .Marland KitchenIncision and Drainage Date/Time: 11/10/2015 2:18 PM Performed by: Marcha Dutton, Kimika Streater Authorized by: Sherlene Shams   Consent:    Consent obtained:  Verbal   Consent given by:  Patient   Risks discussed:  Pain, incomplete drainage and damage to other organs Location:    Type:  Abscess   Location:  Upper extremity   Upper extremity location:  Arm   Arm location:  L lower arm Anesthesia (see MAR for exact dosages):    Anesthesia method:  Local  infiltration   Local anesthetic:  Lidocaine 2% w/o epi Procedure type:    Complexity:  Simple Procedure details:    Needle aspiration: no     Incision types:  Single straight   Incision depth:  Dermal   Scalpel blade:  11   Drainage:  Purulent and bloody   Drainage amount:  Moderate   Wound treatment:  Wound left open   Packing materials:  None Post-procedure details:    Patient tolerance of procedure:  Tolerated well, no immediate complications Comments:     The raised area of fluctuance was  drained with a superficial puncture, this was followed by purulent exudate that was expressed manually. A 1 cm superficial incision was made into the dermis and upper portion of the subcutaneous soft tissue however there was no return of purulence. No further exploration performed. No additional purulence expressed.   (including critical care time)  Labs Review Labs Reviewed - No data to display  Imaging Review No results found.   Visual Acuity Review  Right Eye Distance:   Left Eye Distance:   Bilateral Distance:    Right Eye Near:   Left Eye Near:    Bilateral Near:         MDM   1. Abscess   2. Cellulitis of left upper extremity    The left arm has a superficial abscess with surrounding infection called cellulitis. We are treating that with an injection of antibiotics today and to antibiotics that you need to start taking today as well. Apply warm compresses you have been to both areas of each arm. The incision that was made to your left arm should heal up in close within 2-3 days. Recommended he return in 2 days for a wound check to make sure you are getting better. If the redness, swelling and pain increases and especially if you develop a fever or start feeling sick you should seek medical attention promptly. Meds ordered this encounter  Medications  . cefTRIAXone (ROCEPHIN) injection 1 g  . cephALEXin (KEFLEX) 500 MG capsule    Sig: Take 1 capsule (500 mg total) by mouth  4 (four) times daily.    Dispense:  28 capsule    Refill:  0    Order Specific Question:   Supervising Provider    Answer:   Sherlene Shams [[id number]]  . sulfamethoxazole-trimethoprim (BACTRIM DS,SEPTRA DS) 800-160 MG tablet    Sig: Take 1 tablet by mouth 2 (two) times daily.    Dispense:  14 tablet    Refill:  0    Order Specific Question:   Supervising Provider    Answer:   Sherlene Shams [[id number]]  . HYDROcodone-acetaminophen (NORCO/VICODIN) 5-325 MG tablet    Sig: Take 1 tablet by mouth every 4 (four) hours as needed.    Dispense:  15 tablet    Refill:  0    Order Specific Question:   Supervising Provider    Answer:   Sherlene Shams [[id number]]       Janne Napoleon, NP 11/10/15 1428

## 2015-11-10 NOTE — ED Triage Notes (Signed)
Patient reports abscess to both forearms.  Patient has open wound on right forearm.  Patient reports a history of the same.    [place] [address] [place] and left due to conflict with provider

## 2015-11-13 LAB — AEROBIC CULTURE  (SUPERFICIAL SPECIMEN): GRAM STAIN: NONE SEEN

## 2015-11-13 LAB — AEROBIC CULTURE W GRAM STAIN (SUPERFICIAL SPECIMEN): Special Requests: NORMAL

## 2015-11-17 ENCOUNTER — Telehealth (HOSPITAL_COMMUNITY): Payer: Self-pay | Admitting: Emergency Medicine

## 2015-11-17 NOTE — Telephone Encounter (Signed)
-----   Message from Sherlene Shams, MD sent at 11/14/2015  5:34 PM EDT ----- Please let patient know that forearm wound culture was positive for MRSA.  Rx trimethroprim/sulfa and cephalexin given at [facility] visit 11/10/15.  Ok to stop cephalexin.  Finish trimethoprim/sulfa.  Recheck or followup PCP Sandi Mariscal if increasing redness/swelling/pain/drainage or new fever >100.5.

## 2015-11-17 NOTE — Telephone Encounter (Signed)
Called pt and notified of recent lab results Pt ID'd properly... Reports feeling better but still having some "pus drainage" Also notified of pending Rx of bactrim sent to [facility]) Pt states she will go p/u today Adv pt if sx are not getting better to return or to f/u w/PCP Pt verb understanding.

## 2015-12-04 ENCOUNTER — Encounter: Payer: Self-pay | Admitting: Nurse Practitioner

## 2015-12-04 ENCOUNTER — Ambulatory Visit (INDEPENDENT_AMBULATORY_CARE_PROVIDER_SITE_OTHER): Payer: Medicare Other | Admitting: Nurse Practitioner

## 2015-12-04 VITALS — BP 154/85 | HR 69 | Ht 65.0 in | Wt 148.2 lb

## 2015-12-04 DIAGNOSIS — I10 Essential (primary) hypertension: Secondary | ICD-10-CM | POA: Diagnosis not present

## 2015-12-04 DIAGNOSIS — I639 Cerebral infarction, unspecified: Secondary | ICD-10-CM

## 2015-12-04 DIAGNOSIS — Z720 Tobacco use: Secondary | ICD-10-CM

## 2015-12-04 NOTE — Patient Instructions (Signed)
Continue aspirin 325 mg daily  for secondary stroke prevention  maintain strict control of hypertension with blood pressure goal below 130/90, todays reading 154/85  lipids with LDL cholesterol goal below 70 mg/dL.Continue Lipitor Eat a healthy diet with plenty of whole grains, cereals, fruits and vegetables, exercise regularly  F/U in 6 months

## 2015-12-04 NOTE — Progress Notes (Signed)
[facility]  PATIENT: Karla Nelson DOB: 02/18/1965   REASON FOR VISIT: Follow-up for hemiplegic stroke HISTORY FROM: Patient    HISTORY OF PRESENT ILLNESS: Karla Nelson is a 76 year Caucasian lady who seen today for the first office follow-up visit following hospital admission for stroke in July 2016.Karla Nelson is a 50 y.o. female hx of HTN, NSTEMI, HLD, and cocaine usage presenting with 3 day history of right sided weakness. Notes symptoms started after an argument with her mother in law. Started in her RUE and now also involves RLE. Denies any sensory deficits. No visual or speech deficits.  MRI brain imaging  Personally reviewed, showed acute infarct in left posterior limb of internal capsule.  Date last known well: 08/29/2014 Time last known well: unclear tPA Given: no, outside tPA window Modified Rankin: Rankin Score=0. LDL cholesterol was elevated at 113 mg percent and hemoglobin A1c was 6.9. Transthoracic echo showed normal ejection fraction and carotid ultrasound showed no significant extra-axial stenosis. 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 Patient was counseled to quit smoking and drugs and started on aspirin. She states she's done well. Her right-sided weakness has improved only intermittently she will drag her right leg. She's been suffering from a cold for the last few days and plans to see her primary care physician for that soon. She continues to smoke and has not yet quit smoking but wants to discuss smoking cessation options with her primary physician. She is back to work and works part-time but has to work at the [employer]. Blood pressure is well controlled and today it is 144/83. She is tolerating Lipitor without myalgias or arthralgias. Update 03/26/2015 : She returns for follow-up after last visit 3 months ago. She has been complaining of increasing spasms in her right leg and cramps. She initially felt this was related to aching  potassium which she has discontinued but trouble walking, leg cramps and spasms continue. She was started by her primary physician and Zanaflex but she takes it only once a day and it doesn't work as well. She was seen in the emergency room and had a CT scan of the head last month which showed no acute abnormality and old left basal ganglia infarct. She was unable to return to work at [employer] as she had to be on her feet all day and she had trouble walking with that. She states she's lost about 20 pounds and is eating healthy. She remains on aspirin which is tolerating well. She has not yet quit smoking but plans to do so. She states her blood pressure is under good control though it is elevated today at 153/87. Update 06/04/2015: Patient is seen today for follow-up after last visit 2 months ago. This appointment had previously been made a year ago and was not canceled. She states she continues to do well from stroke standpoint without recurrent neurovascular symptoms. She has developed a stye in the left eye for the last 3 days and numbness using some eyedrops. She continues to have right leg spasms and pain. She has seen Dr. Posey Pronto from neuro rehabilitation who has started baclofen and plans to use Botox next week. She does do stretching of her leg but didn't help as much. She has successfully quit smoking 1 month ago. She is tolerating aspirin without bleeding or bruising. She states her blood pressure is usually quite good though it is elevated at 146/87 in office today.     UPDATE 12/04/15 CM Karla Nelson, 50 year old  female returns for follow-up. She has a history of stroke in July 2016. She is currently on aspirin for secondary stroke prevention without significant bruising or bleeding. Blood pressure elevated in the office today at 150/85. Lipids are followed by her primary care Dr. Taleyah Hillman Fetter. she remains on Lipitor she is no longer seeing Dr. Posey Pronto neuro rehabilitation and she is no longer taking baclofen. She  claims she is doing some stretching exercises. She has started back smoking and was encouraged to quit. She returns for reevaluation REVIEW OF SYSTEMS: Full 14 system review of systems performed and notable only for those listed, all others are neg:  Constitutional: neg  Cardiovascular: neg Ear/Nose/Throat: neg  Skin: neg Eyes: neg Respiratory: neg Gastroitestinal: neg  Hematology/Lymphatic: neg  Endocrine: neg Musculoskeletal:neg Allergy/Immunology: neg Neurological: neg Psychiatric: neg Sleep : neg   ALLERGIES: No Known Allergies  HOME MEDICATIONS: Outpatient Medications Prior to Visit  Medication Sig Dispense Refill  . amLODipine (NORVASC) 10 MG tablet Take 10 mg by mouth at bedtime.  12  . aspirin 325 MG tablet Take 1 tablet (325 mg total) by mouth daily.    Marland Kitchen atorvastatin (LIPITOR) 20 MG tablet Take 1 tablet (20 mg total) by mouth daily at 6 PM. 30 tablet 1  . cephALEXin (KEFLEX) 500 MG capsule Take 1 capsule (500 mg total) by mouth 4 (four) times daily. 28 capsule 0  . hydrALAZINE (APRESOLINE) 100 MG tablet TAKE 1 TABLET (100 MG TOTAL) BY MOUTH 3 (THREE) TIMES DAILY. 90 tablet 1  . HYDROcodone-acetaminophen (NORCO/VICODIN) 5-325 MG tablet Take 1 tablet by mouth every 4 (four) hours as needed. 15 tablet 0  . labetalol (NORMODYNE) 300 MG tablet Take 300 mg by mouth 2 (two) times daily.    Marland Kitchen METHADONE HCL PO Take 20 mg by mouth daily.     . Multiple Vitamin (MULTIVITAMIN WITH MINERALS) TABS tablet Take 1 tablet by mouth daily.    . nitroGLYCERIN (NITROSTAT) 0.4 MG SL tablet Place 0.4 mg under the tongue every 5 (five) minutes as needed for chest pain.    . Omega-3 Fatty Acids (FISH OIL PO) Take 1 capsule by mouth every morning.    . OXYGEN Inhale into the lungs. At night     No facility-administered medications prior to visit.     PAST MEDICAL HISTORY: Past Medical History:  Diagnosis Date  . Asthma   . CAD (coronary artery disease)    NSTEMI with cath in August 2012  with nonobstructive disease, 50% ostial D1 and 70% distal LCX and PL disease. Normal EF. Has diastolic dysfunction with elevated EDP  . Cleft palate    special denture covers defect like a C, sleeps with device in place  . COPD (chronic obstructive pulmonary disease) (HCC)    o2 and bedtime   . DYSPNEA ON EXERTION 03/08/2007  . ELECTROCARDIOGRAM, ABNORMAL 02/13/2010  . History of oxygen administration    bedtime  . HTN (hypertension)    Has normal renal arteries.  . Hyperlipidemia   . NSTEMI (non-ST elevated myocardial infarction) ([place]) 09-25-2010  . Stroke [facility])    july 2016, Dr Donnella Sham  . Tobacco abuse disorder     PAST SURGICAL HISTORY: Past Surgical History:  Procedure Laterality Date  . CARDIAC CATHETERIZATION  Aug 2012   Moderate nonobstructive multivessel CAD with an EF of 70 to 75%  . CESAREAN SECTION    . CORONARY ANGIOPLASTY     no further intervention last cardiac visit greater than a year  FAMILY HISTORY: Family History  Problem Relation Age of Onset  . Asthma Mother   . Heart disease Mother   . Hypertension Mother   . Asthma Brother   . Diabetes    . Hypertension      SOCIAL HISTORY: Social History   Social History  . Marital status: Single    Spouse name: N/A  . Number of children: N/A  . Years of education: N/A   Occupational History  . Not on file.   Social History Main Topics  . Smoking status: Former Smoker    Packs/day: 0.50    Years: 31.00    Types: Cigarettes    Quit date: 04/18/2015  . Smokeless tobacco: Never Used  . Alcohol use No  . Drug use: No     Comment: former drug use  . Sexual activity: Yes   Other Topics Concern  . Not on file   Social History Narrative  . No narrative on file     PHYSICAL EXAM  Vitals:   12/04/15 1244  Weight: 148 lb 3.2 oz (67.2 kg)  Height: 5\' 5"  (1.651 m)   Body mass index is 24.66 kg/m. General: pleasant middle-aged African-American lady, seated, in no evident distress Head: head  normocephalic and atraumatic.  Neck: supple with no carotid bruits Cardiovascular: regular rate and rhythm, no murmurs Musculoskeletal: no deformity Skin:  no rash/petichiae Vascular:  Normal pulses all extremities Respiratory : Scattered rhonchi bilaterally Neurological examination  Mental Status: Awake and fully alert. Oriented to place and time. Recent and remote memory intact. Attention span, concentration and fund of knowledge appropriate. Mood and affect appropriate.  Cranial Nerves: Fundoscopic exam  not done. Pupils equal, briskly reactive to light. Extraocular movements full without nystagmus. Visual fields full to confrontation. Hearing intact. Facial sensation intact. Face, tongue, palate moves normally and symmetrically.  Motor: Normal bulk and tone. Normal strength in all tested extremity muscles. Diminished fine finger movements on the right. Orbits left over right upper extremity. Mild 4/5 weakness in the right lower extremities with right mild ankle foot drop Sensory.: intact to touch ,pinprick .position and vibratory sensation in the upper and lower extremities.  Coordination: Intact in the upper extremities. Impaired in the right lower extremity.  Gait and Station: Arises from chair without difficulty. Stance is normal. Gait demonstrates mild right leg spasticity. Reflexes: 2+ and asymmetric brisker on right. Toes downgoing.   DIAGNOSTIC DATA (LABS, IMAGING, TESTING) - I reviewed patient records, labs, notes, testing and imaging myself where available.  Lab Results  Component Value Date   WBC 8.4 02/14/2015   HGB 13.9 02/14/2015   HCT 41.0 02/14/2015   MCV 90.0 02/14/2015   PLT 237 02/14/2015      Component Value Date/Time   NA 139 02/14/2015 1933   K 3.8 02/14/2015 1933   CL 99 (L) 02/14/2015 1933   CO2 26 02/14/2015 1906   GLUCOSE 149 (H) 02/14/2015 1933   BUN 38 (H) 02/14/2015 1933   CREATININE 1.80 (H) 02/14/2015 1933   CALCIUM 9.9 02/14/2015 1906   PROT  7.0 02/14/2015 1906   ALBUMIN 4.1 02/14/2015 1906   AST 22 02/14/2015 1906   ALT 16 02/14/2015 1906   ALKPHOS 70 02/14/2015 1906   BILITOT 0.4 02/14/2015 1906   GFRNONAA 29 (L) 02/14/2015 1906   GFRAA 34 (L) 02/14/2015 1906   Lab Results  Component Value Date   CHOL 171 09/01/2014   HDL 33 (L) 09/01/2014   LDLCALC 113 (H) 09/01/2014  TRIG 127 09/01/2014   CHOLHDL 5.2 09/01/2014   Lab Results  Component Value Date   HGBA1C 6.9 (H) 09/01/2014      ASSESSMENT AND PLAN 50 year patient with left internal capsule infarct in July 2016 secondary to small vessel disease with risk factors of smoking, hypertension and hyperlipidemia.. Right leg spasms and difficulty walking due to post stroke spasticity   PLAN: Continue aspirin 325 mg daily  for secondary stroke prevention  maintain strict control of hypertension with blood pressure goal below 130/90, todays reading 154/85  lipids with LDL cholesterol goal below 70 mg/dL.Continue Lipitor Eat a healthy diet with plenty of whole grains, cereals, fruits and vegetables, exercise regularly  F/U in 6 months Dennie Bible, [facility], [facility], APRN  [facility] [address] [place], [place] [address] [phone]

## 2015-12-05 NOTE — Progress Notes (Signed)
I agree with the above plan 

## 2016-02-07 ENCOUNTER — Ambulatory Visit (HOSPITAL_COMMUNITY)
Admission: EM | Admit: 2016-02-07 | Discharge: 2016-02-07 | Disposition: A | Discharge status: Home / Self Care | Payer: Medicare Other | Attending: Emergency Medicine | Admitting: Emergency Medicine

## 2016-02-07 ENCOUNTER — Encounter (HOSPITAL_COMMUNITY): Payer: Self-pay | Admitting: *Deleted

## 2016-02-07 DIAGNOSIS — J441 Chronic obstructive pulmonary disease with (acute) exacerbation: Secondary | ICD-10-CM

## 2016-02-07 MED ORDER — IPRATROPIUM-ALBUTEROL 0.5-2.5 (3) MG/3ML IN SOLN
RESPIRATORY_TRACT | Status: AC
  Filled 2016-02-07: qty 3

## 2016-02-07 MED ORDER — PREDNISONE 50 MG PO TABS: ORAL_TABLET | ORAL | 0 refills | Status: DC

## 2016-02-07 MED ORDER — ALBUTEROL SULFATE HFA 108 (90 BASE) MCG/ACT IN AERS: 2.0000 | INHALATION_SPRAY | RESPIRATORY_TRACT | 0 refills | Status: DC | PRN

## 2016-02-07 MED ORDER — DOXYCYCLINE HYCLATE 100 MG PO CAPS: 100.0000 mg | ORAL_CAPSULE | Freq: Two times a day (BID) | ORAL | 0 refills | Status: DC

## 2016-02-07 MED ORDER — IPRATROPIUM-ALBUTEROL 0.5-2.5 (3) MG/3ML IN SOLN
3.0000 mL | Freq: Once | RESPIRATORY_TRACT | Status: AC
  Administered 2016-02-07: 3 mL via RESPIRATORY_TRACT

## 2016-02-07 NOTE — ED Provider Notes (Addendum)
[facility]    CSN: [id number] Arrival date & time: 02/07/16  1201     History   Chief Complaint Chief Complaint  Patient presents with  . Cough    HPI Karla Nelson is a 50 y.o. female.   HPI  She is a 50 year old woman here for evaluation of cough and wheezing. Symptoms started about 3 days ago with nasal congestion, cough, and wheezing. She denies any fevers or shortness of breath. No difficulty eating or drinking. No nausea or vomiting. She used to be on inhalers for COPD, but hasn't had any in quite some time.  She came in today because she was concerned about the wheezing.  Past Medical History:  Diagnosis Date  . Asthma   . CAD (coronary artery disease)    NSTEMI with cath in August 2012 with nonobstructive disease, 50% ostial D1 and 70% distal LCX and PL disease. Normal EF. Has diastolic dysfunction with elevated EDP  . Cleft palate    special denture covers defect like a C, sleeps with device in place  . COPD (chronic obstructive pulmonary disease) (HCC)    o2 and bedtime   . DYSPNEA ON EXERTION 03/08/2007  . ELECTROCARDIOGRAM, ABNORMAL 02/13/2010  . History of oxygen administration    bedtime  . HTN (hypertension)    Has normal renal arteries.  . Hyperlipidemia   . NSTEMI (non-ST elevated myocardial infarction) ([place]) 09-25-2010  . Stroke [facility])    july 2016, Dr Donnella Sham  . Tobacco abuse disorder     Patient Active Problem List   Diagnosis Date Noted  . Spastic hemiplegia affecting nondominant side ([place]) 03/26/2015  . Smoking 12/30/2014  . Chronic obstructive pulmonary disease ([place]) 12/30/2014  . Lacunar infarct, acute ([place]) 12/04/2014  . Acute ischemic stroke ([place]) 09/01/2014  . Stroke ([place]) 09/01/2014  . Right sided weakness   . Smoker 03/15/2014  . History of cocaine use 03/15/2014  . Asthma, moderate persistent 07/26/2013  . 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 Acute sinusitis 04/11/2013  . NSTEMI (non-ST elevated myocardial infarction)  ([place]) 06/19/2012  . Acute-on-chronic kidney injury ([place]) 06/19/2012  . Community acquired pneumonia 06/16/2012  . COPD exacerbation ([place]) 12/05/2011  . COPD (chronic obstructive pulmonary disease) ([place]) 07/12/2011  . CAD (coronary artery disease) 01/13/2011  . Tobacco abuse 01/13/2011  . Mixed hyperlipidemia 10/13/2010  . ELECTROCARDIOGRAM, ABNORMAL 02/13/2010  . CLEFT PALATE 02/12/2010  . Essential hypertension 03/08/2007  . DYSPNEA ON EXERTION 03/08/2007    Past Surgical History:  Procedure Laterality Date  . CARDIAC CATHETERIZATION  Aug 2012   Moderate nonobstructive multivessel CAD with an EF of 70 to 75%  . CESAREAN SECTION    . CORONARY ANGIOPLASTY     no further intervention last cardiac visit greater than a year    OB History    Gravida Para Term Preterm AB Living             1   SAB TAB Ectopic Multiple Live Births                   Home Medications    Prior to Admission medications   Medication Sig Start Date End Date Taking? Authorizing Provider  albuterol (PROVENTIL HFA;VENTOLIN HFA) 108 (90 Base) MCG/ACT inhaler Inhale 2 puffs into the lungs every 4 (four) hours as needed for wheezing or shortness of breath. 02/07/16   Melony Overly, MD  amLODipine (NORVASC) 10 MG tablet Take 10 mg by mouth at bedtime. 08/09/14   Historical  Provider, MD  aspirin 325 MG tablet Take 1 tablet (325 mg total) by mouth daily. 09/02/14   Delfina Redwood, MD  atorvastatin (LIPITOR) 20 MG tablet Take 1 tablet (20 mg total) by mouth daily at 6 PM. 09/02/14   Delfina Redwood, MD  doxycycline (VIBRAMYCIN) 100 MG capsule Take 1 capsule (100 mg total) by mouth 2 (two) times daily. 02/07/16   Melony Overly, MD  hydrALAZINE (APRESOLINE) 25 MG tablet Take 25 mg by mouth 2 (two) times daily.    Historical Provider, MD  labetalol (NORMODYNE) 300 MG tablet Take 300 mg by mouth 2 (two) times daily.    Historical Provider, MD  METHADONE HCL PO Take 18 mg by mouth daily.     Historical Provider, MD    Multiple Vitamin (MULTIVITAMIN WITH MINERALS) TABS tablet Take 1 tablet by mouth daily.    Historical Provider, MD  Omega-3 Fatty Acids (FISH OIL PO) Take 1 capsule by mouth every morning.    Historical Provider, MD  predniSONE (DELTASONE) 50 MG tablet Take 1 pill daily for 5 days. 02/07/16   Melony Overly, MD    Family History Family History  Problem Relation Age of Onset  . Asthma Mother   . Heart disease Mother   . Hypertension Mother   . Asthma Brother   . Diabetes    . Hypertension      Social History Social History  Substance Use Topics  . Smoking status: Former Smoker    Packs/day: 0.50    Years: 31.00    Types: Cigarettes    Quit date: 04/18/2015  . Smokeless tobacco: Never Used  . Alcohol use No     Allergies   Patient has no known allergies.   Review of Systems Review of Systems As in history of present illness  Physical Exam Triage Vital Signs ED Triage Vitals  Enc Vitals Group     BP      Pulse      Resp      Temp      Temp src      SpO2      Weight      Height      Head Circumference      Peak Flow      Pain Score      Pain Loc      Pain Edu?      Excl. in [place]?    No data found.   Updated Vital Signs BP 132/86 (BP Location: Right Arm)   Pulse 78   Temp 98.6 F (37 C) (Oral)   Resp 18   LMP 09/09/2014 (Approximate) Comment: none since august  SpO2 94%   Visual Acuity Right Eye Distance:   Left Eye Distance:   Bilateral Distance:    Right Eye Near:   Left Eye Near:    Bilateral Near:     Physical Exam  Constitutional: She is oriented to person, place, and time. She appears well-developed and well-nourished. No distress.  HENT:  Mouth/Throat: Oropharynx is clear and moist. No oropharyngeal exudate.  TMs normal bilaterally. Moderate clear nasal drainage.  Neck: Neck supple.  Cardiovascular: Normal rate, regular rhythm and normal heart sounds.   No murmur heard. Pulmonary/Chest: Effort normal. No respiratory distress. She has  wheezes (expiratory, bilateral bases). She has no rales.  Lymphadenopathy:    She has no cervical adenopathy.  Neurological: She is alert and oriented to person, place, and time.     UC  Treatments / Results  Labs (all labs ordered are listed, but only abnormal results are displayed) Labs Reviewed - No data to display  EKG  EKG Interpretation None       Radiology No results found.  Procedures Procedures (including critical care time)  Medications Ordered in UC Medications  ipratropium-albuterol (DUONEB) 0.5-2.5 (3) MG/3ML nebulizer solution 3 mL (3 mLs Nebulization Given 02/07/16 1246)     Initial Impression / Assessment and Plan / UC Course  I have reviewed the triage vital signs and the nursing notes.  Pertinent labs & imaging results that were available during my care of the patient were reviewed by me and considered in my medical decision making (see chart for details).  Clinical Course     Wheezing much improved after DuoNeb. Patient also reports subjective improvement. COPD exacerbation likely triggered by viral infection. Treat with doxycycline and prednisone. Prescription provided for albuterol inhaler. Return precautions reviewed.  I have verbally reviewed the discharge instructions with the patient. A printed AVS was given to the patient.  All questions were answered prior to discharge.    Final Clinical Impressions(s) / UC Diagnoses   Final diagnoses:  COPD exacerbation (HCC)    New Prescriptions New Prescriptions   ALBUTEROL (PROVENTIL HFA;VENTOLIN HFA) 108 (90 BASE) MCG/ACT INHALER    Inhale 2 puffs into the lungs every 4 (four) hours as needed for wheezing or shortness of breath.   DOXYCYCLINE (VIBRAMYCIN) 100 MG CAPSULE    Take 1 capsule (100 mg total) by mouth 2 (two) times daily.   PREDNISONE (DELTASONE) 50 MG TABLET    Take 1 pill daily for 5 days.     Melony Overly, MD 02/07/16 [place], MD 02/07/16 [phone]

## 2016-02-07 NOTE — Discharge Instructions (Signed)
You have a cold virus that has flared up the COPD. Take doxycycline twice a day for 10 days. Take prednisone daily for 5 days. Use albuterol every 4 hours for the next 2 days, then as needed for wheezing. You should see improvement in the next 2-3 days. If you develop fevers, difficulty breathing, or just not doing well, please come back or go to the emergency room.

## 2016-02-07 NOTE — ED Triage Notes (Signed)
Pt  Is  A  Smoker   She  Reports    Congested      X   Several  Days         pt has  Tightness  In chest     Wheezing      Pt    Reports       That     She    Has  Been  A  Smoker       No  Flu  Shot     Yet        Stopped       sev   days   ago

## 2016-04-09 ENCOUNTER — Ambulatory Visit: Payer: Medicare Other | Attending: Orthopedic Surgery | Admitting: Physical Therapy

## 2016-04-09 ENCOUNTER — Encounter: Payer: Self-pay | Admitting: Physical Therapy

## 2016-04-09 DIAGNOSIS — R262 Difficulty in walking, not elsewhere classified: Secondary | ICD-10-CM | POA: Diagnosis present

## 2016-04-09 DIAGNOSIS — R29898 Other symptoms and signs involving the musculoskeletal system: Secondary | ICD-10-CM

## 2016-04-09 DIAGNOSIS — R269 Unspecified abnormalities of gait and mobility: Secondary | ICD-10-CM | POA: Diagnosis present

## 2016-04-09 DIAGNOSIS — M6281 Muscle weakness (generalized): Secondary | ICD-10-CM | POA: Diagnosis present

## 2016-04-09 DIAGNOSIS — R2689 Other abnormalities of gait and mobility: Secondary | ICD-10-CM | POA: Diagnosis present

## 2016-04-09 NOTE — Therapy (Signed)
[place], [place], [address] Phone: [phone]   Fax:  [phone]  Physical Therapy Evaluation  Patient Details  Name: Karla Nelson MRN: [id number] Date of Birth: 15-Feb-1965 Referring Provider: Dr Blanch Media   Encounter Date: 04/09/2016      PT End of Session - 04/09/16 1229    Visit Number 1   Number of Visits 16   Date for PT Re-Evaluation 06/04/16   Authorization Type Medicare   PT Start Time 4010   PT Stop Time 0843   PT Time Calculation (min) 48 min   Activity Tolerance Patient tolerated treatment well   Behavior During Therapy [facility] for tasks assessed/performed      Past Medical History:  Diagnosis Date  . Asthma   . CAD (coronary artery disease)    NSTEMI with cath in August 2012 with nonobstructive disease, 50% ostial D1 and 70% distal LCX and PL disease. Normal EF. Has diastolic dysfunction with elevated EDP  . Cleft palate    special denture covers defect like a C, sleeps with device in place  . COPD (chronic obstructive pulmonary disease) (HCC)    o2 and bedtime   . DYSPNEA ON EXERTION 03/08/2007  . ELECTROCARDIOGRAM, ABNORMAL 02/13/2010  . History of oxygen administration    bedtime  . HTN (hypertension)    Has normal renal arteries.  . Hyperlipidemia   . NSTEMI (non-ST elevated myocardial infarction) ([place]) 09-25-2010  . Stroke [facility])    july 2016, Dr Donnella Sham  . Tobacco abuse disorder     Past Surgical History:  Procedure Laterality Date  . CARDIAC CATHETERIZATION  Aug 2012   Moderate nonobstructive multivessel CAD with an EF of 70 to 75%  . CESAREAN SECTION    . CORONARY ANGIOPLASTY     no further intervention last cardiac visit greater than a year    There were no vitals filed for this visit.       Subjective Assessment - 04/09/16 0806    Subjective Patient had a stroke in 2016. She has right sided leg wekanes.s She began to develope pain in the back of her legs.  When she stands for too long her pain increase. She has to stand for long periods of time at work.    Pertinent History Stroke 2016    Limitations Standing;Lifting;Walking   How long can you stand comfortably? < 10 minutes    How long can you walk comfortably? limited communtiy distances   Diagnostic tests X-ray and MRI done at [facility] ortho    Patient Stated Goals To have less pain    Currently in Pain? No/denies  Just feels sitffness            [facility] PT Assessment - 04/09/16 0001      Assessment   Medical Diagnosis Right leg weakness/ bilateral sciatica    Referring Provider Dr Blanch Media    Onset Date/Surgical Date --  2016   Hand Dominance Right   Next MD Visit Nothing Scheduled    Prior Therapy For her storke in 2016     Precautions   Precautions None     Restrictions   Weight Bearing Restrictions No     Balance Screen   Has the patient fallen in the past 6 months No   Has the patient had a decrease in activity level because of a fear of falling?  No   Is the patient reluctant to leave their home because of a fear  of falling?  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    Leisure Going to the gym     Cognition   Overall Cognitive Status Within Functional Limits for tasks assessed   Attention Focused   Focused Attention Appears intact   Memory Appears intact   Awareness Appears intact   Problem Solving Appears intact   Executive Function Reasoning                           PT Education - 04/09/16 1229    Education provided Yes   Education Details HEP, symptom management,    Person(s) Educated Patient    Methods Explanation;Demonstration   Comprehension Verbalized understanding;Returned demonstration;Need further instruction          PT Short Term Goals - 04/09/16 1236      PT SHORT TERM GOAL #1   Title Patient will increase right signle leg stance by 5 seconds    Time 4   Period Weeks   Status New     PT SHORT TERM GOAL #2   Title Patrient will increase right lower extremity strength to 5/5    Time 4   Period Weeks   Status New     PT SHORT TERM GOAL #3   Title Patien's right hamstring length will be 90 degrees    Time 4   Period Weeks   Status New     PT SHORT TERM GOAL #4   Title Patient will be independent with inital HEP    Time 4   Period Weeks   Status New           PT Long Term Goals - 04/09/16 1239      PT LONG TERM GOAL #1   Title Patient will show a 42% limitation with FOTO   Baseline No HEP   Time 8   Period Weeks   Status On-going     PT LONG TERM GOAL #2   Title Patient will ambualte 1000' without antalgic gait    Time 8   Period Weeks   Status New     PT LONG TERM GOAL #3   Title Patient will be independent with HEP    Time 8   Period Weeks   Status New               Plan - 04/09/16 1230    Clinical Impression Statement Patient is a 51 year old female with right lower extremity wekaness. At times she reports pain radiating down her legs. She feels like her right lower extremity " cathces" sometimes. She is active and goes to the gym. She would benefit from skilled therapy to improve her right lower extremity stability. She was seen for a low complexity evaluation.    Rehab Potential Good   Clinical Impairments Affecting Rehab Potential stroke 2016   PT Frequency 2x / week   PT Duration 8 weeks   PT Treatment/Interventions ADLs/Self Care Home Management;Cryotherapy;Electrical Stimulation;Iontophoresis 4mg /ml Dexamethasone;Moist Heat;Stair training;Gait training;Functional mobility training;Orthotic Fit/Training;Patient/family  education;Therapeutic exercise;Therapeutic activities;Manual techniques;Passive range of motion;Dry needling;Taping;Neuromuscular re-education  PT Next Visit Plan reviewe HEP, consider neur-musclar re-education, balance exercises on the left, stepping onto the air-ex, stepping forward back quickly, Consider manual therapy if able.    PT Home Exercise Plan SLR; Bridging, hip abduction, hamstring stretch prayer stretch, lateral prayer stretch    Consulted and Agree with Plan of Care Patient      Patient will benefit from skilled therapeutic intervention in order to improve the following deficits and impairments:  Decreased strength, Decreased mobility, Decreased endurance, Decreased activity tolerance, Pain, Increased muscle spasms, Increased fascial restricitons  Visit Diagnosis: Other abnormalities of gait and mobility  Muscle weakness (generalized)  Difficulty walking  Right leg weakness  Abnormality of gait      G-Codes - 05/04/2016 1244    Functional Limitation Mobility: Walking and moving around   Mobility: Walking and Moving Around Current Status [phone]) At least 40 percent but less than 60 percent impaired, limited or restricted   Mobility: Walking and Moving Around Goal Status [phone]) At least 20 percent but less than 40 percent impaired, limited or restricted       Problem List Patient Active Problem List   Diagnosis Date Noted  . Spastic hemiplegia affecting nondominant side ([place]) 03/26/2015  . Smoking 12/30/2014  . Chronic obstructive pulmonary disease ([place]) 12/30/2014  . Lacunar infarct, acute ([place]) 12/04/2014  . Acute ischemic stroke ([place]) 09/01/2014  . Stroke ([place]) 09/01/2014  . Right sided weakness   . Smoker 03/15/2014  . History of cocaine use 03/15/2014  . Asthma, moderate persistent 07/26/2013  . 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 Acute sinusitis 04/11/2013  . NSTEMI (non-ST elevated myocardial infarction) ([place]) 06/19/2012  . Acute-on-chronic kidney  injury ([place]) 06/19/2012  . Community acquired pneumonia 06/16/2012  . COPD exacerbation ([place]) 12/05/2011  . COPD (chronic obstructive pulmonary disease) ([place]) 07/12/2011  . CAD (coronary artery disease) 01/13/2011  . Tobacco abuse 01/13/2011  . Mixed hyperlipidemia 10/13/2010  . ELECTROCARDIOGRAM, ABNORMAL 02/13/2010  . CLEFT PALATE 02/12/2010  . Essential hypertension 03/08/2007  . DYSPNEA ON EXERTION 03/08/2007    Carney Living PT DPT  2016-05-04, 12:46 PM  [facility] [address] [place], [place], [address] Phone: [phone]   Fax:  [phone]  Name: Karla Nelson MRN: [id number] Date of Birth: 1965/02/23

## 2016-04-20 ENCOUNTER — Ambulatory Visit: Payer: Medicare Other | Admitting: Physical Therapy

## 2016-04-22 ENCOUNTER — Ambulatory Visit: Payer: Medicare Other | Admitting: Physical Therapy

## 2016-04-22 ENCOUNTER — Telehealth: Payer: Self-pay | Admitting: Physical Therapy

## 2016-04-22 NOTE — Telephone Encounter (Signed)
Contacted patient about missed appointment. Patient states she forgot about it and though it was yesterday. She will be at her next appointment.

## 2016-04-27 ENCOUNTER — Encounter: Payer: Self-pay | Admitting: Physical Therapy

## 2016-04-27 ENCOUNTER — Ambulatory Visit: Payer: Medicare Other | Admitting: Physical Therapy

## 2016-04-27 DIAGNOSIS — R2689 Other abnormalities of gait and mobility: Secondary | ICD-10-CM | POA: Diagnosis not present

## 2016-04-27 DIAGNOSIS — M6281 Muscle weakness (generalized): Secondary | ICD-10-CM

## 2016-04-27 DIAGNOSIS — R262 Difficulty in walking, not elsewhere classified: Secondary | ICD-10-CM

## 2016-04-27 NOTE — Therapy (Signed)
[place], [place], [address] Phone: [phone]   Fax:  [phone]  Physical Therapy Treatment  Patient Details  Name: Karla Nelson MRN: [id number] Date of Birth: 1965/11/13 Referring Provider: Dr Blanch Media   Encounter Date: 04/27/2016      PT End of Session - 04/27/16 0814    Visit Number 2   Number of Visits 16   Date for PT Re-Evaluation 06/04/16   Authorization Type Medicare   PT Start Time 0811   PT Stop Time 0845   PT Time Calculation (min) 34 min   Activity Tolerance Patient tolerated treatment well   Behavior During Therapy [facility] for tasks assessed/performed      Past Medical History:  Diagnosis Date  . Asthma   . CAD (coronary artery disease)    NSTEMI with cath in August 2012 with nonobstructive disease, 50% ostial D1 and 70% distal LCX and PL disease. Normal EF. Has diastolic dysfunction with elevated EDP  . Cleft palate    special denture covers defect like a C, sleeps with device in place  . COPD (chronic obstructive pulmonary disease) (HCC)    o2 and bedtime   . DYSPNEA ON EXERTION 03/08/2007  . ELECTROCARDIOGRAM, ABNORMAL 02/13/2010  . History of oxygen administration    bedtime  . HTN (hypertension)    Has normal renal arteries.  . Hyperlipidemia   . NSTEMI (non-ST elevated myocardial infarction) ([place]) 09-25-2010  . Stroke [facility])    july 2016, Dr Donnella Sham  . Tobacco abuse disorder     Past Surgical History:  Procedure Laterality Date  . CARDIAC CATHETERIZATION  Aug 2012   Moderate nonobstructive multivessel CAD with an EF of 70 to 75%  . CESAREAN SECTION    . CORONARY ANGIOPLASTY     no further intervention last cardiac visit greater than a year    There were no vitals filed for this visit.      Subjective Assessment - 04/27/16 0816    Subjective Patient has continues to have soreness in her hips and legs. She felt like she was having cramping in hewr right claf  yesterday. She has been doing her exercise. She also reported some swelling in her feet. She was advised to talk to her MD about any swelling in her feet. She is not having pain right now because she is on pain medication.    Pertinent History Stroke 2016    Limitations Standing;Lifting;Walking   How long can you stand comfortably? < 10 minutes    How long can you walk comfortably? limited communtiy distances   Diagnostic tests X-ray and MRI done at [facility] ortho    Patient Stated Goals To have less pain    Currently in Pain? No/denies                         [facility] Adult PT Treatment/Exercise - 04/27/16 0001      Lumbar Exercises: Stretches   Active Hamstring Stretch Limitations 3x20 sec right    Single Knee to Chest Stretch Limitations 3x20sec hold    Piriformis Stretch Limitations 3x20sec hold      Lumbar Exercises: Supine   Clam Limitations 2x10 red    Bridge Limitations 2x10   Straight Leg Raises Limitations 2x10    Other Supine Lumbar Exercises double knee to chest with ball 2x10      Lumbar Exercises: Quadruped   Opposite Arm/Leg Raise Limitations opposite leg riase x5  each leg    Other Quadruped Lumbar Exercises prayer strethc/ lateral prayer stretch      Manual Therapy   Manual therapy comments long axis distraction 3x30sec holds bilateral                 PT Education - 04/27/16 0819    Education provided Yes   Education Details Updated HEP    Person(s) Educated Patient   Methods Explanation;Demonstration   Comprehension Verbalized understanding;Returned demonstration          PT Short Term Goals - 04/27/16 1335      PT SHORT TERM GOAL #1   Title Patient will increase right signle leg stance by 5 seconds    Time 4   Period Weeks   Status On-going     PT SHORT TERM GOAL #2   Title Patrient will increase right lower extremity strength to 5/5    Time 4   Period Weeks   Status On-going     PT SHORT TERM GOAL #3   Title Patien's  right hamstring length will be 90 degrees    Time 4   Period Weeks   Status On-going     PT SHORT TERM GOAL #4   Title Patient will be independent with inital HEP    Time 4   Period Weeks   Status On-going           PT Long Term Goals - 04/09/16 1239      PT LONG TERM GOAL #1   Title Patient will show a 42% limitation with FOTO   Baseline No HEP   Time 8   Period Weeks   Status On-going     PT LONG TERM GOAL #2   Title Patient will ambualte 1000' without antalgic gait    Time 8   Period Weeks   Status New     PT LONG TERM GOAL #3   Title Patient will be independent with HEP    Time 8   Period Weeks   Status New               Plan - 04/27/16 1331    Clinical Impression Statement Patient tolerated treatment well. She had no increase in pain. She was givven updated exercises at for home. Therapy reviewed the improtance of stretching and strengthening. She reported improved pain with long axis distraction bilateraly. Patient was 10 minutes late for her appointment.    Rehab Potential Good   Clinical Impairments Affecting Rehab Potential stroke 2016   PT Frequency 2x / week   PT Duration 8 weeks   PT Treatment/Interventions ADLs/Self Care Home Management;Cryotherapy;Electrical Stimulation;Iontophoresis 4mg /ml Dexamethasone;Moist Heat;Stair training;Gait training;Functional mobility training;Orthotic Fit/Training;Patient/family education;Therapeutic exercise;Therapeutic activities;Manual techniques;Passive range of motion;Dry needling;Taping;Neuromuscular re-education   PT Next Visit Plan reviewe HEP, consider neur-musclar re-education, balance exercises on the left, stepping onto the air-ex, stepping forward back quickly, Consider manual therapy if able.    PT Home Exercise Plan SLR; Bridging, hip abduction, hamstring stretch prayer stretch, lateral prayer stretch    Consulted and Agree with Plan of Care Patient      Patient will benefit from skilled therapeutic  intervention in order to improve the following deficits and impairments:  Decreased strength, Decreased mobility, Decreased endurance, Decreased activity tolerance, Pain, Increased muscle spasms, Increased fascial restricitons  Visit Diagnosis: Other abnormalities of gait and mobility  Muscle weakness (generalized)  Difficulty walking     Problem List Patient Active Problem List   Diagnosis Date Noted  .  Spastic hemiplegia affecting nondominant side ([place]) 03/26/2015  . Smoking 12/30/2014  . Chronic obstructive pulmonary disease ([place]) 12/30/2014  . Lacunar infarct, acute ([place]) 12/04/2014  . Acute ischemic stroke ([place]) 09/01/2014  . Stroke ([place]) 09/01/2014  . Right sided weakness   . Smoker 03/15/2014  . History of cocaine use 03/15/2014  . Asthma, moderate persistent 07/26/2013  . 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 Acute sinusitis 04/11/2013  . NSTEMI (non-ST elevated myocardial infarction) ([place]) 06/19/2012  . Acute-on-chronic kidney injury ([place]) 06/19/2012  . Community acquired pneumonia 06/16/2012  . COPD exacerbation ([place]) 12/05/2011  . COPD (chronic obstructive pulmonary disease) ([place]) 07/12/2011  . CAD (coronary artery disease) 01/13/2011  . Tobacco abuse 01/13/2011  . Mixed hyperlipidemia 10/13/2010  . ELECTROCARDIOGRAM, ABNORMAL 02/13/2010  . CLEFT PALATE 02/12/2010  . Essential hypertension 03/08/2007  . DYSPNEA ON EXERTION 03/08/2007    Carney Living PT DPT  04/27/2016, 1:37 PM  [facility] [address] [place], [place], [address] Phone: [phone]   Fax:  [phone]  Name: Karla Nelson MRN: [id number] Date of Birth: 1965/05/09

## 2016-04-29 ENCOUNTER — Encounter: Payer: Self-pay | Admitting: Physical Therapy

## 2016-04-29 ENCOUNTER — Ambulatory Visit: Payer: Medicare Other | Admitting: Physical Therapy

## 2016-04-29 DIAGNOSIS — R29898 Other symptoms and signs involving the musculoskeletal system: Secondary | ICD-10-CM

## 2016-04-29 DIAGNOSIS — R2689 Other abnormalities of gait and mobility: Secondary | ICD-10-CM

## 2016-04-29 DIAGNOSIS — R262 Difficulty in walking, not elsewhere classified: Secondary | ICD-10-CM

## 2016-04-29 DIAGNOSIS — R269 Unspecified abnormalities of gait and mobility: Secondary | ICD-10-CM

## 2016-04-29 DIAGNOSIS — M6281 Muscle weakness (generalized): Secondary | ICD-10-CM

## 2016-04-29 NOTE — Therapy (Signed)
[place], [place], [address] Phone: [phone]   Fax:  [phone]  Physical Therapy Treatment  Patient Details  Name: Karla Nelson MRN: [id number] Date of Birth: [birth year] Referring Provider: Dr Blanch Media   Encounter Date: 04/29/2016      PT End of Session - 04/29/16 0851    Visit Number 3   Number of Visits 16   Date for PT Re-Evaluation 06/04/16   PT Start Time 0803   PT Stop Time 5465   PT Time Calculation (min) 44 min   Activity Tolerance Patient tolerated treatment well   Behavior During Therapy [facility] for tasks assessed/performed      Past Medical History:  Diagnosis Date  . Asthma   . CAD (coronary artery disease)    NSTEMI with cath in August 2012 with nonobstructive disease, 50% ostial D1 and 70% distal LCX and PL disease. Normal EF. Has diastolic dysfunction with elevated EDP  . Cleft palate    special denture covers defect like a C, sleeps with device in place  . COPD (chronic obstructive pulmonary disease) (HCC)    o2 and bedtime   . DYSPNEA ON EXERTION 03/08/2007  . ELECTROCARDIOGRAM, ABNORMAL 02/13/2010  . History of oxygen administration    bedtime  . HTN (hypertension)    Has normal renal arteries.  . Hyperlipidemia   . NSTEMI (non-ST elevated myocardial infarction) ([place]) 09-25-2010  . Stroke [facility])    july 2016, Dr Donnella Sham  . Tobacco abuse disorder     Past Surgical History:  Procedure Laterality Date  . CARDIAC CATHETERIZATION  Aug 2012   Moderate nonobstructive multivessel CAD with an EF of 70 to 75%  . CESAREAN SECTION    . CORONARY ANGIOPLASTY     no further intervention last cardiac visit greater than a year    There were no vitals filed for this visit.      Subjective Assessment - 04/29/16 0807    Subjective Feels left knee shifts.  Right leg drags.    Currently in Pain? --   Pain Score 5   none right now   Pain Location Knee   Pain Orientation  Left;Posterior;Distal   Pain Descriptors / Indicators --  pops   Pain Onset More than a month ago   Pain Frequency Intermittent   Aggravating Factors  walking   Pain Relieving Factors medication   Effect of Pain on Daily Activities Limited long distances,  slows her down   Multiple Pain Sites No                         [facility] Adult PT Treatment/Exercise - 04/29/16 0001      Ambulation/Gait   Gait Comments she pounds the floor ,  cued to walk softer,  wear supportive comfortable shoes.       High Level Balance   High Level Balance Comments compliant, non compliant drills activities,  Cues Close SBA     Lumbar Exercises: Stretches   Active Hamstring Stretch 3 reps;30 seconds  each side   Pelvic Tilt Limitations 10 X 5 seconds cues initially     Knee/Hip Exercises: Supine   Bridges Limitations 10 X     Knee/Hip Exercises: Sidelying   Clams 10 X Monitores for compensation.  moderat difficult     Manual Therapy   Manual therapy comments instrument assist medial hamstrings to soften,  less pops noted with gait.  PT Education - 04/29/16 0854    Education provided Yes   Education Details Soft tissue work,  supportive shoes,  stop pounding with gait.   Person(s) Educated Patient   Methods Explanation;Demonstration   Comprehension Verbalized understanding          PT Short Term Goals - 04/27/16 1335      PT SHORT TERM GOAL #1   Title Patient will increase right signle leg stance by 5 seconds    Time 4   Period Weeks   Status On-going     PT SHORT TERM GOAL #2   Title Patrient will increase right lower extremity strength to 5/5    Time 4   Period Weeks   Status On-going     PT SHORT TERM GOAL #3   Title Patien's right hamstring length will be 90 degrees    Time 4   Period Weeks   Status On-going     PT SHORT TERM GOAL #4   Title Patient will be independent with inital HEP    Time 4   Period Weeks   Status On-going            PT Long Term Goals - 04/09/16 1239      PT LONG TERM GOAL #1   Title Patient will show a 42% limitation with FOTO   Baseline No HEP   Time 8   Period Weeks   Status On-going     PT LONG TERM GOAL #2   Title Patient will ambualte 1000' without antalgic gait    Time 8   Period Weeks   Status New     PT LONG TERM GOAL #3   Title Patient will be independent with HEP    Time 8   Period Weeks   Status New               Plan - 04/29/16 4742    Clinical Impression Statement Gait improved post swession with increased right leg control.  and less pops left knee.  Patient continues to need more work with dynamic balance.  She has flatter feet.  She will try to bring more supportive shoes next visit.    PT Next Visit Plan Gait with supportive shoes.  Medial hamstrings soft tissue work if needed,  work toward goals.    PT Home Exercise Plan SLR; Bridging, hip abduction, hamstring stretch prayer stretch, lateral prayer stretch    Consulted and Agree with Plan of Care Patient      Patient will benefit from skilled therapeutic intervention in order to improve the following deficits and impairments:  Decreased strength, Decreased mobility, Decreased endurance, Decreased activity tolerance, Pain, Increased muscle spasms, Increased fascial restricitons  Visit Diagnosis: Other abnormalities of gait and mobility  Muscle weakness (generalized)  Difficulty walking  Right leg weakness  Abnormality of gait     Problem List Patient Active Problem List   Diagnosis Date Noted  . Spastic hemiplegia affecting nondominant side ([place]) 03/26/2015  . Smoking 12/30/2014  . Chronic obstructive pulmonary disease ([place]) 12/30/2014  . Lacunar infarct, acute ([place]) 12/04/2014  . Acute ischemic stroke ([place]) 09/01/2014  . Stroke ([place]) 09/01/2014  . Right sided weakness   . Smoker 03/15/2014  . History of cocaine use 03/15/2014  . Asthma, moderate persistent 07/26/2013  . Chronic  rhinosinusitis 04/11/2013  . Acute sinusitis 04/11/2013  . NSTEMI (non-ST elevated myocardial infarction) ([place]) 06/19/2012  . Acute-on-chronic kidney injury ([place]) 06/19/2012  . Community acquired pneumonia 06/16/2012  .  COPD exacerbation ([place]) 12/05/2011  . COPD (chronic obstructive pulmonary disease) ([place]) 07/12/2011  . CAD (coronary artery disease) 01/13/2011  . Tobacco abuse 01/13/2011  . Mixed hyperlipidemia 10/13/2010  . ELECTROCARDIOGRAM, ABNORMAL 02/13/2010  . CLEFT PALATE 02/12/2010  . Essential hypertension 03/08/2007  . DYSPNEA ON EXERTION 03/08/2007    Tashawn Laswell PTA 04/29/2016, 8:56 AM  [facility] [address] [place], [place], [address] Phone: [phone]   Fax:  [phone]  Name: Annalysia Willenbring MRN: [id number] Date of Birth: 1965-05-25

## 2016-05-03 ENCOUNTER — Ambulatory Visit: Payer: Medicare Other | Admitting: Physical Therapy

## 2016-05-05 ENCOUNTER — Encounter: Payer: Self-pay | Admitting: Physical Therapy

## 2016-05-05 ENCOUNTER — Ambulatory Visit: Payer: Medicare Other | Admitting: Physical Therapy

## 2016-05-05 DIAGNOSIS — R262 Difficulty in walking, not elsewhere classified: Secondary | ICD-10-CM

## 2016-05-05 DIAGNOSIS — R269 Unspecified abnormalities of gait and mobility: Secondary | ICD-10-CM

## 2016-05-05 DIAGNOSIS — M6281 Muscle weakness (generalized): Secondary | ICD-10-CM

## 2016-05-05 DIAGNOSIS — R2689 Other abnormalities of gait and mobility: Secondary | ICD-10-CM

## 2016-05-05 DIAGNOSIS — R29898 Other symptoms and signs involving the musculoskeletal system: Secondary | ICD-10-CM

## 2016-05-05 NOTE — Therapy (Signed)
[place], [place], [address] Phone: [phone]   Fax:  [phone]  Physical Therapy Evaluation  Patient Details  Name: Karla Nelson MRN: [id number] Date of Birth: 03-Oct-1965 Referring Provider: Dr Blanch Media   Encounter Date: 05/05/2016      PT End of Session - 05/05/16 2107    Visit Number 4   Number of Visits 16   Date for PT Re-Evaluation 06/04/16   Authorization Type Medicare   PT Start Time 0808   PT Stop Time 0847   PT Time Calculation (min) 39 min   Activity Tolerance Patient tolerated treatment well   Behavior During Therapy [facility] for tasks assessed/performed      Past Medical History:  Diagnosis Date  . Asthma   . CAD (coronary artery disease)    NSTEMI with cath in August 2012 with nonobstructive disease, 50% ostial D1 and 70% distal LCX and PL disease. Normal EF. Has diastolic dysfunction with elevated EDP  . Cleft palate    special denture covers defect like a C, sleeps with device in place  . COPD (chronic obstructive pulmonary disease) (HCC)    o2 and bedtime   . DYSPNEA ON EXERTION 03/08/2007  . ELECTROCARDIOGRAM, ABNORMAL 02/13/2010  . History of oxygen administration    bedtime  . HTN (hypertension)    Has normal renal arteries.  . Hyperlipidemia   . NSTEMI (non-ST elevated myocardial infarction) ([place]) 09-25-2010  . Stroke [facility])    july 2016, Dr Donnella Sham  . Tobacco abuse disorder     Past Surgical History:  Procedure Laterality Date  . CARDIAC CATHETERIZATION  Aug 2012   Moderate nonobstructive multivessel CAD with an EF of 70 to 75%  . CESAREAN SECTION    . CORONARY ANGIOPLASTY     no further intervention last cardiac visit greater than a year    There were no vitals filed for this visit.       Subjective Assessment - 05/05/16 0818    Subjective Patient reports she feels better for a day or two but then she has pain come back. She missed last visit because  she had the wrong day. She has been doing her stretches when she comes ghome form work.    Pertinent History Stroke 2016    Limitations Standing;Lifting;Walking   How long can you stand comfortably? < 10 minutes    How long can you walk comfortably? limited communtiy distances   Diagnostic tests X-ray and MRI done at [facility] orth[facility]    Patient Stated Goals To have less pain    Currently in Pain? Yes   Pain Score 4    Pain Location Back   Pain Orientation Left;Posterior   Pain Descriptors / Indicators Aching   Pain Radiating Towards into the left upper glut    Pain Onset More than a month ago   Pain Frequency Intermittent   Aggravating Factors  walking    Pain Relieving Factors medication    Effect of Pain on Daily Activities limited walking long distances                        [facility] Adult PT Treatment/Exercise - 05/05/16 0001      Lumbar Exercises: Stretches   Active Hamstring Stretch Limitations 3x20 sec right    Single Knee to Chest Stretch Limitations 3x20sec hold    Lower Trunk Rotation Limitations 2x10   Pelvic Tilt Limitations 10 X 5 seconds cues  initially   Piriformis Stretch Limitations 3x20sec hold      Lumbar Exercises: Standing   Other Standing Lumbar Exercises single leg stance 5 x15 second holds; step onto air-ex with hold 5x15 second holds      Lumbar Exercises: Supine   Clam Limitations 2x10 red    Bridge Limitations 2x10   Straight Leg Raises Limitations 2x10      Knee/Hip Exercises: Supine   Bridges Limitations 2x10     Knee/Hip Exercises: Sidelying   Clams 10 X Monitores for compensation.  moderat difficult                PT Education - 05/05/16 [phone]    Education provided Yes   Education Details sioft tissue work    [employer]) Educated Patient   Methods Explanation;Demonstration   Comprehension Verbalized understanding          PT Short Term Goals - 05/05/16 2117      PT SHORT TERM GOAL #1   Title Patient will  increase right signle leg stance by 5 seconds    Time 4   Period Weeks   Status On-going     PT SHORT TERM GOAL #2   Title Patrient will increase right lower extremity strength to 5/5    Time 4   Period Weeks   Status On-going     PT SHORT TERM GOAL #3   Title Patien's right hamstring length will be 90 degrees    Time 4   Period Weeks   Status On-going     PT SHORT TERM GOAL #4   Title Patient will be independent with inital HEP    Time 4   Period Weeks   Status On-going           PT Long Term Goals - 04/09/16 1239      PT LONG TERM GOAL #1   Title Patient will show a 42% limitation with FOTO   Baseline No HEP   Time 8   Period Weeks   Status On-going     PT LONG TERM GOAL #2   Title Patient will ambualte 1000' without antalgic gait    Time 8   Period Weeks   Status New     PT LONG TERM GOAL #3   Title Patient will be independent with HEP    Time 8   Period Weeks   Status New               Plan - 05/05/16 2107    Clinical Impression Statement Patient was 8 minutes late for her appointment. She toleratd treament wll. She continues to have pain in her left lateral hip and back but it is likely coming form significant instability in her right leg. Therapy worked on increased single leg right instability today. No new golas met today.    Rehab Potential Good   Clinical Impairments Affecting Rehab Potential stroke 2016   PT Frequency 2x / week   PT Duration 8 weeks   PT Treatment/Interventions ADLs/Self Care Home Management;Cryotherapy;Electrical Stimulation;Iontophoresis 90m/ml Dexamethasone;Moist Heat;Stair training;Gait training;Functional mobility training;Orthotic Fit/Training;Patient/family education;Therapeutic exercise;Therapeutic activities;Manual techniques;Passive range of motion;Dry needling;Taping;Neuromuscular re-education   PT Next Visit Plan Gait with supportive shoes.  Medial hamstrings soft tissue work if needed,  work toward goals.  Continue to work on right single leg strengthening.    PT Home Exercise Plan SLR; Bridging, hip abduction, hamstring stretch prayer stretch, lateral prayer stretch    Consulted and Agree with Plan of Care  Patient      Patient will benefit from skilled therapeutic intervention in order to improve the following deficits and impairments:  Decreased strength, Decreased mobility, Decreased endurance, Decreased activity tolerance, Pain, Increased muscle spasms, Increased fascial restricitons  Visit Diagnosis: Other abnormalities of gait and mobility  Muscle weakness (generalized)  Difficulty walking  Right leg weakness  Abnormality of gait     Problem List Patient Active Problem List   Diagnosis Date Noted  . Spastic hemiplegia affecting nondominant side ([place]) 03/26/2015  . Smoking 12/30/2014  . Chronic obstructive pulmonary disease ([place]) 12/30/2014  . Lacunar infarct, acute ([place]) 12/04/2014  . Acute ischemic stroke ([place]) 09/01/2014  . Stroke ([place]) 09/01/2014  . Right sided weakness   . Smoker 03/15/2014  . History of cocaine use 03/15/2014  . Asthma, moderate persistent 07/26/2013  . 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 Acute sinusitis 04/11/2013  . NSTEMI (non-ST elevated myocardial infarction) ([place]) 06/19/2012  . Acute-on-chronic kidney injury ([place]) 06/19/2012  . Community acquired pneumonia 06/16/2012  . COPD exacerbation ([place]) 12/05/2011  . COPD (chronic obstructive pulmonary disease) ([place]) 07/12/2011  . CAD (coronary artery disease) 01/13/2011  . Tobacco abuse 01/13/2011  . Mixed hyperlipidemia 10/13/2010  . ELECTROCARDIOGRAM, ABNORMAL 02/13/2010  . CLEFT PALATE 02/12/2010  . Essential hypertension 03/08/2007  . DYSPNEA ON EXERTION 03/08/2007    Carney Living PT DPT  05/05/2016, 9:23 PM  [place], [place], [address] Phone: [phone]   Fax:  [phone]  Name: Karla Nelson MRN: [id number] Date of Birth: Dec 31, 1965

## 2016-05-11 ENCOUNTER — Ambulatory Visit: Payer: Medicare Other | Attending: Orthopedic Surgery | Admitting: Physical Therapy

## 2016-05-11 ENCOUNTER — Encounter: Payer: Self-pay | Admitting: Physical Therapy

## 2016-05-11 DIAGNOSIS — R2689 Other abnormalities of gait and mobility: Secondary | ICD-10-CM

## 2016-05-11 DIAGNOSIS — R269 Unspecified abnormalities of gait and mobility: Secondary | ICD-10-CM | POA: Diagnosis present

## 2016-05-11 DIAGNOSIS — R262 Difficulty in walking, not elsewhere classified: Secondary | ICD-10-CM | POA: Diagnosis present

## 2016-05-11 DIAGNOSIS — R29898 Other symptoms and signs involving the musculoskeletal system: Secondary | ICD-10-CM | POA: Insufficient documentation

## 2016-05-11 DIAGNOSIS — M6281 Muscle weakness (generalized): Secondary | ICD-10-CM | POA: Insufficient documentation

## 2016-05-11 NOTE — Therapy (Signed)
[place], [place], [address] Phone: [phone]   Fax:  [phone]  Physical Therapy Treatment  Patient Details  Name: Karla Nelson MRN: [id number] Date of Birth: [birth year] Referring Provider: Dr Blanch Media   Encounter Date: 05/11/2016      PT End of Session - 05/11/16 0853    Visit Number 5   Number of Visits 16   Date for PT Re-Evaluation 06/04/16   PT Start Time 0805   PT Stop Time 0907   PT Time Calculation (min) 62 min   Activity Tolerance Patient tolerated treatment well   Behavior During Therapy [facility] for tasks assessed/performed      Past Medical History:  Diagnosis Date  . Asthma   . CAD (coronary artery disease)    NSTEMI with cath in August 2012 with nonobstructive disease, 50% ostial D1 and 70% distal LCX and PL disease. Normal EF. Has diastolic dysfunction with elevated EDP  . Cleft palate    special denture covers defect like a C, sleeps with device in place  . COPD (chronic obstructive pulmonary disease) (HCC)    o2 and bedtime   . DYSPNEA ON EXERTION 03/08/2007  . ELECTROCARDIOGRAM, ABNORMAL 02/13/2010  . History of oxygen administration    bedtime  . HTN (hypertension)    Has normal renal arteries.  . Hyperlipidemia   . NSTEMI (non-ST elevated myocardial infarction) ([place]) 09-25-2010  . Stroke [facility])    july 2016, Dr Donnella Sham  . Tobacco abuse disorder     Past Surgical History:  Procedure Laterality Date  . CARDIAC CATHETERIZATION  Aug 2012   Moderate nonobstructive multivessel CAD with an EF of 70 to 75%  . CESAREAN SECTION    . CORONARY ANGIOPLASTY     no further intervention last cardiac visit greater than a year    There were no vitals filed for this visit.      Subjective Assessment - 05/11/16 0807    Subjective I want to try the stepper.  I am stiff with the colder weather.  Buttocks less painful.  Squatting more comfortable,  getting out of bed easier as  long as it is not cold.   Currently in Pain? No/denies   Pain Descriptors / Indicators --  Stiffness  not pain Buttocks   Aggravating Factors  walking,  standing on my legs,  bending down,  shifting,  cold weather   Pain Relieving Factors Moving around when she is cold   Effect of Pain on Daily Activities Has to lean on shopping cart.  Limits walking   Multiple Pain Sites --  Has medial knee friction when she is cold,  walking longer,  climbing steps,  heavy liftng/ carry                         [facility] Adult PT Treatment/Exercise - 05/11/16 0001      Lumbar Exercises: Supine   Bent Knee Raise 10 reps   Bent Knee Raise Limitations cues for slowly lowering     Knee/Hip Exercises: Stretches   Gastroc Stretch 3 reps;30 seconds   Gastroc Stretch Limitations cues needed , already had for techniique     Knee/Hip Exercises: Standing   Forward Step Up Both;2 sets;Hand Hold: 2;Step Height: 6"   Functional Squat 10 reps   Functional Squat Limitations Good form,  hip hinge and kettle bell, 5 LBS lift     Knee/Hip Exercises: Supine   Constance Haw  Limitations 15   Single Leg Bridge --  6 X left,  10 x right     Modalities   Modalities Moist Heat     Moist Heat Therapy   Number Minutes Moist Heat 15 Minutes   Moist Heat Location Knee  Left     Manual Therapy   Manual Therapy Soft tissue mobilization   Soft tissue mobilization retrograde, fascial work ans gentle soft tissue work medial hamstrings,  tissue softened.  Gentle  passive hamstring stretching.                PT Education - 05/11/16 0859    Education provided Yes   Education Details HEP   Person(s) Educated Patient   Methods Explanation;Demonstration;Verbal cues;Tactile cues;Handout   Comprehension Verbalized understanding;Returned demonstration          PT Short Term Goals - 05/11/16 0901      PT SHORT TERM GOAL #1   Title Patient will increase right signle leg stance by 5 seconds    Time 4    Period Weeks   Status Unable to assess     PT SHORT TERM GOAL #2   Title Patrient will increase right lower extremity strength to 5/5    Time 4   Period Weeks   Status Unable to assess     PT SHORT TERM GOAL #3   Title Patien's right hamstring length will be 90 degrees    Baseline Not yet 90 degrees   Time 4   Period Weeks   Status On-going     PT SHORT TERM GOAL #4   Title Patient will be independent with inital HEP    Baseline minor cues   Time 4   Period Weeks   Status On-going           PT Long Term Goals - 04/09/16 1239      PT LONG TERM GOAL #1   Title Patient will show a 42% limitation with FOTO   Baseline No HEP   Time 8   Period Weeks   Status On-going     PT LONG TERM GOAL #2   Title Patient will ambualte 1000' without antalgic gait    Time 8   Period Weeks   Status New     PT LONG TERM GOAL #3   Title Patient will be independent with HEP    Time 8   Period Weeks   Status New               Plan - 05/11/16 1027    Clinical Impression Statement Patient had no pain today during session however she had medial knee "Friction" with gait .  Friction relieved with gastroc stretches. No hip pain today.    PT Next Visit Plan Gait with supportive shoes.  Medial hamstrings soft tissue work if needed,  work toward goals. Continue to work on right single leg strengthening. Measure hamsrtings and single leg stand.   PT Home Exercise Plan SLR; Bridging, hip abduction, hamstring stretch prayer stretch, lateral prayer stretch gastroc stretch   Consulted and Agree with Plan of Care Patient      Patient will benefit from skilled therapeutic intervention in order to improve the following deficits and impairments:  Decreased strength, Decreased mobility, Decreased endurance, Decreased activity tolerance, Pain, Increased muscle spasms, Increased fascial restricitons  Visit Diagnosis: Other abnormalities of gait and mobility  Muscle weakness  (generalized)  Difficulty walking  Right leg weakness  Abnormality of gait  Problem List Patient Active Problem List   Diagnosis Date Noted  . Spastic hemiplegia affecting nondominant side ([place]) 03/26/2015  . Smoking 12/30/2014  . Chronic obstructive pulmonary disease ([place]) 12/30/2014  . Lacunar infarct, acute ([place]) 12/04/2014  . Acute ischemic stroke ([place]) 09/01/2014  . Stroke ([place]) 09/01/2014  . Right sided weakness   . Smoker 03/15/2014  . History of cocaine use 03/15/2014  . Asthma, moderate persistent 07/26/2013  . 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 Acute sinusitis 04/11/2013  . NSTEMI (non-ST elevated myocardial infarction) ([place]) 06/19/2012  . Acute-on-chronic kidney injury ([place]) 06/19/2012  . Community acquired pneumonia 06/16/2012  . COPD exacerbation ([place]) 12/05/2011  . COPD (chronic obstructive pulmonary disease) ([place]) 07/12/2011  . CAD (coronary artery disease) 01/13/2011  . Tobacco abuse 01/13/2011  . Mixed hyperlipidemia 10/13/2010  . ELECTROCARDIOGRAM, ABNORMAL 02/13/2010  . CLEFT PALATE 02/12/2010  . Essential hypertension 03/08/2007  . DYSPNEA ON EXERTION 03/08/2007    HARRIS,KAREN PTA 05/11/2016, 9:04 AM  [facility] [address] [place], [place], [address] Phone: [phone]   Fax:  [phone]  Name: Eldred Lievanos MRN: [id number] Date of Birth: [birth year]

## 2016-05-11 NOTE — Patient Instructions (Signed)
Achilles / Gastroc, Standing    Stand, right foot behind, heel on floor and turned slightly out, leg slightly bent., forward leg bent. Move hips forward. Hold _30__ seconds. Repeat _3__ times per session. Do _1__ sessions per day.  Life long exercise.  Copyright  VHI. All rights reserved.

## 2016-05-13 ENCOUNTER — Ambulatory Visit: Payer: Medicare Other | Admitting: Physical Therapy

## 2016-05-13 ENCOUNTER — Telehealth: Payer: Self-pay | Admitting: Physical Therapy

## 2016-05-13 NOTE — Telephone Encounter (Signed)
Spoke to patient regarding missed appointment this morning. She forgot, and plans to attend her next scheduled appointment.

## 2016-05-18 ENCOUNTER — Ambulatory Visit: Payer: Medicare Other | Admitting: Physical Therapy

## 2016-05-18 ENCOUNTER — Encounter: Payer: Self-pay | Admitting: Physical Therapy

## 2016-05-18 DIAGNOSIS — R269 Unspecified abnormalities of gait and mobility: Secondary | ICD-10-CM

## 2016-05-18 DIAGNOSIS — R2689 Other abnormalities of gait and mobility: Secondary | ICD-10-CM | POA: Diagnosis not present

## 2016-05-18 DIAGNOSIS — R262 Difficulty in walking, not elsewhere classified: Secondary | ICD-10-CM

## 2016-05-18 DIAGNOSIS — M6281 Muscle weakness (generalized): Secondary | ICD-10-CM

## 2016-05-18 DIAGNOSIS — R29898 Other symptoms and signs involving the musculoskeletal system: Secondary | ICD-10-CM

## 2016-05-18 NOTE — Therapy (Signed)
[place], [place], [address] Phone: [phone]   Fax:  [phone]  Physical Therapy Treatment  Patient Details  Name: Karla Nelson MRN: [id number] Date of Birth: May 29, 1965 Referring Provider: Dr Blanch Media   Encounter Date: 05/18/2016      PT End of Session - 05/18/16 1051    Visit Number 6   Number of Visits 16   Date for PT Re-Evaluation 06/04/16   PT Start Time 0852   PT Stop Time 0940   PT Time Calculation (min) 48 min   Activity Tolerance Patient tolerated treatment well   Behavior During Therapy [facility] for tasks assessed/performed      Past Medical History:  Diagnosis Date  . Asthma   . CAD (coronary artery disease)    NSTEMI with cath in August 2012 with nonobstructive disease, 50% ostial D1 and 70% distal LCX and PL disease. Normal EF. Has diastolic dysfunction with elevated EDP  . Cleft palate    special denture covers defect like a C, sleeps with device in place  . COPD (chronic obstructive pulmonary disease) (HCC)    o2 and bedtime   . DYSPNEA ON EXERTION 03/08/2007  . ELECTROCARDIOGRAM, ABNORMAL 02/13/2010  . History of oxygen administration    bedtime  . HTN (hypertension)    Has normal renal arteries.  . Hyperlipidemia   . NSTEMI (non-ST elevated myocardial infarction) ([place]) 09-25-2010  . Stroke [facility])    july 2016, Dr Donnella Sham  . Tobacco abuse disorder     Past Surgical History:  Procedure Laterality Date  . CARDIAC CATHETERIZATION  Aug 2012   Moderate nonobstructive multivessel CAD with an EF of 70 to 75%  . CESAREAN SECTION    . CORONARY ANGIOPLASTY     no further intervention last cardiac visit greater than a year    There were no vitals filed for this visit.      Subjective Assessment - 05/18/16 0902    Subjective I have a cold.  I did not come last session due to the cold.   NO pain   Currently in Pain? No/denies   Pain Descriptors / Indicators --   Tightness in leg left no pain   Pain Frequency Occasional   Aggravating Factors  Standing longer, walking longer   Pain Relieving Factors rest, apple cider vineger ([place]) Black seed oil, epson salts bath,  alcohol.   Effect of Pain on Daily Activities limits standing and walking                         [facility] Adult PT Treatment/Exercise - 05/18/16 0001      High Level Balance   High Level Balance Comments In corner on floor, on pillow narrowed base ,  static and dynamic with head movements arm movements, weight shifts,  HEP     Lumbar Exercises: Stretches   Passive Hamstring Stretch 3 reps;30 seconds   Both     Knee/Hip Exercises: Stretches   Passive Hamstring Stretch 3 reps;30 seconds     Knee/Hip Exercises: Standing   Heel Raises Both;10 reps   Heel Raises Limitations single leg  toe lifts, single leg, cued to keep knee flexed.    SLS 1-2 seconds best,  multiple reps   Other Standing Knee Exercises Terminal knee control 5 x blue band Left   Other Standing Knee Exercises 5 X each wall slides facing wall, pre gait min assist for knee control  Knee/Hip Exercises: Supine   Quad Sets 10 reps   Other Supine Knee/Hip Exercises Isometric hamstrings                PT Education - 05/18/16 1051    Education provided Yes   Education Details HEP   Person(s) Educated Patient   Methods Explanation;Demonstration;Verbal cues;Handout   Comprehension Verbalized understanding;Returned demonstration          PT Short Term Goals - 05/18/16 1055      PT SHORT TERM GOAL #1   Title Patient will increase right signle leg stance by 5 seconds    Baseline 2-3 seconds Max   Time 4   Period Weeks   Status On-going     PT SHORT TERM GOAL #2   Title Patrient will increase right lower extremity strength to 5/5    Time 4   Period Weeks   Status Unable to assess     PT SHORT TERM GOAL #3   Title Patien's right hamstring length will be 90 degrees    Baseline  Not yet 90 degrees   Time 4   Period Weeks   Status On-going     PT SHORT TERM GOAL #4   Title Patient will be independent with inital HEP    Time 4   Period Weeks   Status Unable to assess           PT Long Term Goals - 04/09/16 1239      PT LONG TERM GOAL #1   Title Patient will show a 42% limitation with FOTO   Baseline No HEP   Time 8   Period Weeks   Status On-going     PT LONG TERM GOAL #2   Title Patient will ambualte 1000' without antalgic gait    Time 8   Period Weeks   Status New     PT LONG TERM GOAL #3   Title Patient will be independent with HEP    Time 8   Period Weeks   Status New               Plan - 05/18/16 1052    Clinical Impression Statement No pain continues.  Focus on strengthening and balance.   manual helpful with Tension posterior Lt knee, SLS 2-3 seconds at the most.   PT Next Visit Plan Gait with supportive shoes.  Medial hamstrings soft tissue work if needed,  work toward goals. Continue to work on right single leg strengthening. Measure hamsrtings.   PT Home Exercise Plan SLR; Bridging, hip abduction, hamstring stretch prayer stretch, lateral prayer stretch gastroc stretch, Balance exercises in corner   Consulted and Agree with Plan of Care Patient      Patient will benefit from skilled therapeutic intervention in order to improve the following deficits and impairments:  Decreased strength, Decreased mobility, Decreased endurance, Decreased activity tolerance, Pain, Increased muscle spasms, Increased fascial restricitons  Visit Diagnosis: Other abnormalities of gait and mobility  Muscle weakness (generalized)  Difficulty walking  Right leg weakness  Abnormality of gait     Problem List Patient Active Problem List   Diagnosis Date Noted  . Spastic hemiplegia affecting nondominant side ([place]) 03/26/2015  . Smoking 12/30/2014  . Chronic obstructive pulmonary disease ([place]) 12/30/2014  . Lacunar infarct, acute ([place])  12/04/2014  . Acute ischemic stroke ([place]) 09/01/2014  . Stroke ([place]) 09/01/2014  . Right sided weakness   . Smoker 03/15/2014  . History of cocaine use 03/15/2014  .  Asthma, moderate persistent 07/26/2013  . 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 Acute sinusitis 04/11/2013  . NSTEMI (non-ST elevated myocardial infarction) ([place]) 06/19/2012  . Acute-on-chronic kidney injury ([place]) 06/19/2012  . Community acquired pneumonia 06/16/2012  . COPD exacerbation ([place]) 12/05/2011  . COPD (chronic obstructive pulmonary disease) ([place]) 07/12/2011  . CAD (coronary artery disease) 01/13/2011  . Tobacco abuse 01/13/2011  . Mixed hyperlipidemia 10/13/2010  . ELECTROCARDIOGRAM, ABNORMAL 02/13/2010  . CLEFT PALATE 02/12/2010  . Essential hypertension 03/08/2007  . DYSPNEA ON EXERTION 03/08/2007    HARRIS,KAREN PTA 05/18/2016, 10:57 AM  [facility] [address] [place], [place], [address] Phone: [phone]   Fax:  [phone]  Name: Karla Nelson MRN: [id number] Date of Birth: December 15, 1965

## 2016-05-18 NOTE — Patient Instructions (Signed)
In a corner  Work on balance.Bring a chair in close. Try standing on pillow feet together, feet heel to toe. Try single leg stand. Try not moving, try weight shifts from foot to foot Try head movements, try reaching.   When easier,  Stand on a pillow.  Avoid locking knees straight.

## 2016-05-20 ENCOUNTER — Encounter: Payer: Self-pay | Admitting: Physical Therapy

## 2016-05-20 ENCOUNTER — Ambulatory Visit: Payer: Medicare Other | Admitting: Physical Therapy

## 2016-05-20 DIAGNOSIS — M6281 Muscle weakness (generalized): Secondary | ICD-10-CM

## 2016-05-20 DIAGNOSIS — R29898 Other symptoms and signs involving the musculoskeletal system: Secondary | ICD-10-CM

## 2016-05-20 DIAGNOSIS — R2689 Other abnormalities of gait and mobility: Secondary | ICD-10-CM | POA: Diagnosis not present

## 2016-05-20 DIAGNOSIS — R262 Difficulty in walking, not elsewhere classified: Secondary | ICD-10-CM

## 2016-05-20 DIAGNOSIS — R269 Unspecified abnormalities of gait and mobility: Secondary | ICD-10-CM

## 2016-05-20 NOTE — Therapy (Signed)
[place], [place], [address] Phone: [phone]   Fax:  [phone]  Physical Therapy Treatment  Patient Details  Name: Karla Nelson MRN: [id number] Date of Birth: Aug 14, 1965 Referring Provider: Dr Blanch Media   Encounter Date: 05/20/2016      PT End of Session - 05/20/16 0825    Visit Number 7   Number of Visits 16   Date for PT Re-Evaluation 06/04/16   Authorization Type Medicare   PT Start Time 0800   PT Stop Time 0843   PT Time Calculation (min) 43 min   Activity Tolerance Patient tolerated treatment well   Behavior During Therapy [facility] for tasks assessed/performed      Past Medical History:  Diagnosis Date  . Asthma   . CAD (coronary artery disease)    NSTEMI with cath in August 2012 with nonobstructive disease, 50% ostial D1 and 70% distal LCX and PL disease. Normal EF. Has diastolic dysfunction with elevated EDP  . Cleft palate    special denture covers defect like a C, sleeps with device in place  . COPD (chronic obstructive pulmonary disease) (HCC)    o2 and bedtime   . DYSPNEA ON EXERTION 03/08/2007  . ELECTROCARDIOGRAM, ABNORMAL 02/13/2010  . History of oxygen administration    bedtime  . HTN (hypertension)    Has normal renal arteries.  . Hyperlipidemia   . NSTEMI (non-ST elevated myocardial infarction) ([place]) 09-25-2010  . Stroke [facility])    july 2016, Dr Donnella Sham  . Tobacco abuse disorder     Past Surgical History:  Procedure Laterality Date  . CARDIAC CATHETERIZATION  Aug 2012   Moderate nonobstructive multivessel CAD with an EF of 70 to 75%  . CESAREAN SECTION    . CORONARY ANGIOPLASTY     no further intervention last cardiac visit greater than a year    There were no vitals filed for this visit.      Subjective Assessment - 05/20/16 0820    Subjective Patient reports no pain this morning but she feels like her hip and hamstring on the right side is very tight. She  feles like her pain is improving at work. She still gets sore but it improves ads the day goes on.    Pertinent History Stroke 2016    Limitations Standing;Lifting;Walking   How long can you stand comfortably? < 10 minutes    How long can you walk comfortably? limited communtiy distances   Diagnostic tests X-ray and MRI done at [facility] ortho    Patient Stated Goals To have less pain    Currently in Pain? No/denies                         [facility] Adult PT Treatment/Exercise - 05/20/16 0001      High Level Balance   High Level Balance Comments In corner on floor, on pillow narrowed base ,  static and dynamic with head movements arm movements, weight shifts,  HEP     Lumbar Exercises: Stretches   Passive Hamstring Stretch 3 reps;30 seconds   Both   Lower Trunk Rotation Limitations 2x10   Piriformis Stretch Limitations 3x20sec hold      Lumbar Exercises: Supine   Bridge Limitations 2x10   Straight Leg Raises Limitations 2x10      Knee/Hip Exercises: Stretches   Passive Hamstring Stretch 3 reps;30 seconds     Knee/Hip Exercises: Standing   Heel Raises Limitations 2x10  with right leg    Forward Step Up Both;2 sets;Hand Hold: 2;Step Height: 6"   Functional Squat 10 reps   Functional Squat Limitations 2x10 with cuing for technique    SLS 1-2 seconds best,  multiple reps   Other Standing Knee Exercises Terminal knee control 2x10 blue band Left     Knee/Hip Exercises: Supine   Bridges Limitations 2x10                PT Education - 05/20/16 0825    Education provided Yes   Education Details continue with HEP    Person(s) Educated Patient   Methods Explanation;Demonstration;Verbal cues;Handout   Comprehension Verbalized understanding;Returned demonstration          PT Short Term Goals - 05/20/16 0831      PT SHORT TERM GOAL #1   Title Patient will increase right signle leg stance by 5 seconds    Baseline 2-3 seconds Max 4-12   Time 4     PT  SHORT TERM GOAL #2   Title Patrient will increase right lower extremity strength to 5/5    Baseline not tested    Time 4   Period Weeks   Status On-going     PT SHORT TERM GOAL #3   Title Patien's right hamstring length will be 90 degrees    Baseline 902 degrees 4/12    Time 4   Period Weeks   Status On-going     PT SHORT TERM GOAL #4   Title Patient will be independent with inital HEP    Baseline minor cues   Time 4   Period Weeks   Status On-going           PT Long Term Goals - 04/09/16 1239      PT LONG TERM GOAL #1   Title Patient will show a 42% limitation with FOTO   Baseline No HEP   Time 8   Period Weeks   Status On-going     PT LONG TERM GOAL #2   Title Patient will ambualte 1000' without antalgic gait    Time 8   Period Weeks   Status New     PT LONG TERM GOAL #3   Title Patient will be independent with HEP    Time 8   Period Weeks   Status New               Plan - 05/20/16 8563    Clinical Impression Statement Therapy continues to work on standing stability and bilateral LE strengthening. She is making progress. She was encouraged to be patient. The patient met STG # 3 today    Rehab Potential Good   Clinical Impairments Affecting Rehab Potential stroke 2016   PT Frequency 2x / week   PT Duration 8 weeks   PT Treatment/Interventions ADLs/Self Care Home Management;Cryotherapy;Electrical Stimulation;Iontophoresis 16m/ml Dexamethasone;Moist Heat;Stair training;Gait training;Functional mobility training;Orthotic Fit/Training;Patient/family education;Therapeutic exercise;Therapeutic activities;Manual techniques;Passive range of motion;Dry needling;Taping;Neuromuscular re-education   PT Next Visit Plan Gait with supportive shoes.  Medial hamstrings soft tissue work if needed,  work toward goals. Continue to work on right single leg strengthening. Measure hamsrtings. FOTO    PT Home Exercise Plan SLR; Bridging, hip abduction, hamstring stretch  prayer stretch, lateral prayer stretch gastroc stretch, Balance exercises in corner   Consulted and Agree with Plan of Care Patient      Patient will benefit from skilled therapeutic intervention in order to improve the following deficits and impairments:  Decreased  strength, Decreased mobility, Decreased endurance, Decreased activity tolerance, Pain, Increased muscle spasms, Increased fascial restricitons  Visit Diagnosis: Other abnormalities of gait and mobility  Muscle weakness (generalized)  Difficulty walking  Right leg weakness  Abnormality of gait     Problem List Patient Active Problem List   Diagnosis Date Noted  . Spastic hemiplegia affecting nondominant side ([place]) 03/26/2015  . Smoking 12/30/2014  . Chronic obstructive pulmonary disease ([place]) 12/30/2014  . Lacunar infarct, acute ([place]) 12/04/2014  . Acute ischemic stroke ([place]) 09/01/2014  . Stroke ([place]) 09/01/2014  . Right sided weakness   . Smoker 03/15/2014  . History of cocaine use 03/15/2014  . Asthma, moderate persistent 07/26/2013  . 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 Acute sinusitis 04/11/2013  . NSTEMI (non-ST elevated myocardial infarction) ([place]) 06/19/2012  . Acute-on-chronic kidney injury ([place]) 06/19/2012  . Community acquired pneumonia 06/16/2012  . COPD exacerbation ([place]) 12/05/2011  . COPD (chronic obstructive pulmonary disease) ([place]) 07/12/2011  . CAD (coronary artery disease) 01/13/2011  . Tobacco abuse 01/13/2011  . Mixed hyperlipidemia 10/13/2010  . ELECTROCARDIOGRAM, ABNORMAL 02/13/2010  . CLEFT PALATE 02/12/2010  . Essential hypertension 03/08/2007  . DYSPNEA ON EXERTION 03/08/2007    Carney Living PT DPT  05/20/2016, 3:03 PM  [facility] [address] [place], [place], [address] Phone: [phone]   Fax:  [phone]  Name: Ka Bench MRN: [id number] Date of Birth: 1965-10-08

## 2016-05-25 ENCOUNTER — Ambulatory Visit: Payer: Medicare Other | Admitting: Physical Therapy

## 2016-05-25 ENCOUNTER — Encounter: Payer: Self-pay | Admitting: Physical Therapy

## 2016-05-25 DIAGNOSIS — M6281 Muscle weakness (generalized): Secondary | ICD-10-CM

## 2016-05-25 DIAGNOSIS — R269 Unspecified abnormalities of gait and mobility: Secondary | ICD-10-CM

## 2016-05-25 DIAGNOSIS — R2689 Other abnormalities of gait and mobility: Secondary | ICD-10-CM

## 2016-05-25 DIAGNOSIS — R29898 Other symptoms and signs involving the musculoskeletal system: Secondary | ICD-10-CM

## 2016-05-25 DIAGNOSIS — R262 Difficulty in walking, not elsewhere classified: Secondary | ICD-10-CM

## 2016-05-25 NOTE — Therapy (Signed)
[place], [place], [address] Phone: [phone]   Fax:  [phone]  Physical Therapy Treatment  Patient Details  Name: Karla Nelson MRN: [id number] Date of Birth: 02/20/1965 Referring Provider: Dr Blanch Media   Encounter Date: 05/25/2016      PT End of Session - 05/25/16 0809    Visit Number 8   Number of Visits 16   Date for PT Re-Evaluation 06/04/16   Authorization Type Medicare   PT Start Time 0759   PT Stop Time 0840   PT Time Calculation (min) 41 min   Activity Tolerance Patient tolerated treatment well   Behavior During Therapy [facility] for tasks assessed/performed      Past Medical History:  Diagnosis Date  . Asthma   . CAD (coronary artery disease)    NSTEMI with cath in August 2012 with nonobstructive disease, 50% ostial D1 and 70% distal LCX and PL disease. Normal EF. Has diastolic dysfunction with elevated EDP  . Cleft palate    special denture covers defect like a C, sleeps with device in place  . COPD (chronic obstructive pulmonary disease) (HCC)    o2 and bedtime   . DYSPNEA ON EXERTION 03/08/2007  . ELECTROCARDIOGRAM, ABNORMAL 02/13/2010  . History of oxygen administration    bedtime  . HTN (hypertension)    Has normal renal arteries.  . Hyperlipidemia   . NSTEMI (non-ST elevated myocardial infarction) ([place]) 09-25-2010  . Stroke [facility])    july 2016, Dr Donnella Sham  . Tobacco abuse disorder     Past Surgical History:  Procedure Laterality Date  . CARDIAC CATHETERIZATION  Aug 2012   Moderate nonobstructive multivessel CAD with an EF of 70 to 75%  . CESAREAN SECTION    . CORONARY ANGIOPLASTY     no further intervention last cardiac visit greater than a year    There were no vitals filed for this visit.      Subjective Assessment - 05/25/16 0806    Subjective Patient reports her hip is feeling much better. She is still having some pain behind her knee. She calls it a Emergency planning/management officer.     Pertinent History Stroke 2016    Limitations Standing;Lifting;Walking   How long can you stand comfortably? < 10 minutes    How long can you walk comfortably? limited communtiy distances   Diagnostic tests X-ray and MRI done at [facility] ortho    Patient Stated Goals To have less pain    Currently in Pain? No/denies                         [facility] Adult PT Treatment/Exercise - 05/25/16 0001      Lumbar Exercises: Stretches   Passive Hamstring Stretch 3 reps;30 seconds   Both   Lower Trunk Rotation Limitations 2x10   Piriformis Stretch Limitations 3x20sec hold      Lumbar Exercises: Supine   Bridge Limitations 2x10   Straight Leg Raises Limitations 2x10    Other Supine Lumbar Exercises clam shells 2x10 green      Knee/Hip Exercises: Standing   Heel Raises Limitations 2x10 with right leg    Forward Step Up Both;2 sets;Hand Hold: 2;Step Height: 6"   Functional Squat Limitations 2x10 with cuing for technique    SLS 5 seconds on left 2 seocnds on the right      Manual Therapy   Manual Therapy Soft tissue mobilization   Soft tissue mobilization retrograde, fascial  work ans gentle soft tissue work medial hamstrings,  tissue softened.  Gentle  passive hamstring stretching.                PT Education - 05/25/16 0808    Education provided Yes   Education Details continue to work on HEP    Methods Explanation;Demonstration;Tactile cues;Verbal cues   Comprehension Verbalized understanding          PT Short Term Goals - 05/25/16 0900      PT SHORT TERM GOAL #1   Title Patient will increase right signle leg stance by 5 seconds    Baseline 10 seconds with some instability    Time 4   Period Weeks   Status Achieved     PT SHORT TERM GOAL #2   Title Patrient will increase right lower extremity strength to 5/5    Baseline not tested    Time 4   Period Weeks   Status On-going     PT SHORT TERM GOAL #3   Title Patien's right hamstring length will  be 90 degrees    Baseline 90 degrees 4/12    Time 4   Period Weeks   Status On-going     PT SHORT TERM GOAL #4   Title Patient will be independent with inital HEP    Baseline minor cues   Time 4   Period Weeks   Status On-going           PT Long Term Goals - 04/09/16 1239      PT LONG TERM GOAL #1   Title Patient will show a 42% limitation with FOTO   Baseline No HEP   Time 8   Period Weeks   Status On-going     PT LONG TERM GOAL #2   Title Patient will ambualte 1000' without antalgic gait    Time 8   Period Weeks   Status New     PT LONG TERM GOAL #3   Title Patient will be independent with HEP    Time 8   Period Weeks   Status New               Plan - 05/25/16 6962    Clinical Impression Statement Patient tolerated treatment well. she had no increase in pain. Therapy continues to work on stretching her posterior knee area. She feels like she is off when she walks. Leg lenght checked. She did appear to have a slight descrepency right shorter then left. Therapy put a level 1 shoe wedge in and noted improved gait. She was advised that more of a wedge can be used if she feels improvement. Therapy educated the patient on the improtance of her stretches and single leg work at home. She had no improvement on FOTO.    Rehab Potential Good   Clinical Impairments Affecting Rehab Potential stroke 2016   PT Frequency 2x / week   PT Duration 8 weeks   PT Treatment/Interventions ADLs/Self Care Home Management;Cryotherapy;Electrical Stimulation;Iontophoresis 4mg /ml Dexamethasone;Moist Heat;Stair training;Gait training;Functional mobility training;Orthotic Fit/Training;Patient/family education;Therapeutic exercise;Therapeutic activities;Manual techniques;Passive range of motion;Dry needling;Taping;Neuromuscular re-education   PT Next Visit Plan Gait with supportive shoes.  Medial hamstrings soft tissue work if needed,  work toward goals. Continue to work on right single leg  strengthening. Measure hamsrtings. FOTO    PT Home Exercise Plan SLR; Bridging, hip abduction, hamstring stretch prayer stretch, lateral prayer stretch gastroc stretch, Balance exercises in corner   Consulted and Agree with Plan of Care Patient  Patient will benefit from skilled therapeutic intervention in order to improve the following deficits and impairments:  Decreased strength, Decreased mobility, Decreased endurance, Decreased activity tolerance, Pain, Increased muscle spasms, Increased fascial restricitons  Visit Diagnosis: Other abnormalities of gait and mobility  Muscle weakness (generalized)  Difficulty walking  Right leg weakness  Abnormality of gait     Problem List Patient Active Problem List   Diagnosis Date Noted  . Spastic hemiplegia affecting nondominant side ([place]) 03/26/2015  . Smoking 12/30/2014  . Chronic obstructive pulmonary disease ([place]) 12/30/2014  . Lacunar infarct, acute ([place]) 12/04/2014  . Acute ischemic stroke ([place]) 09/01/2014  . Stroke ([place]) 09/01/2014  . Right sided weakness   . Smoker 03/15/2014  . History of cocaine use 03/15/2014  . Asthma, moderate persistent 07/26/2013  . 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 Acute sinusitis 04/11/2013  . NSTEMI (non-ST elevated myocardial infarction) ([place]) 06/19/2012  . Acute-on-chronic kidney injury ([place]) 06/19/2012  . Community acquired pneumonia 06/16/2012  . COPD exacerbation ([place]) 12/05/2011  . COPD (chronic obstructive pulmonary disease) ([place]) 07/12/2011  . CAD (coronary artery disease) 01/13/2011  . Tobacco abuse 01/13/2011  . Mixed hyperlipidemia 10/13/2010  . ELECTROCARDIOGRAM, ABNORMAL 02/13/2010  . CLEFT PALATE 02/12/2010  . Essential hypertension 03/08/2007  . DYSPNEA ON EXERTION 03/08/2007    Carney Living  PT DPT  05/25/2016, 10:40 AM  [facility] [address] [place], [place], [address] Phone: [phone]   Fax:   [phone]  Name: Karla Nelson MRN: [id number] Date of Birth: 08-18-1965

## 2016-05-27 ENCOUNTER — Ambulatory Visit: Payer: Medicare Other | Admitting: Physical Therapy

## 2016-05-27 DIAGNOSIS — R29898 Other symptoms and signs involving the musculoskeletal system: Secondary | ICD-10-CM

## 2016-05-27 DIAGNOSIS — R2689 Other abnormalities of gait and mobility: Secondary | ICD-10-CM | POA: Diagnosis not present

## 2016-05-27 DIAGNOSIS — M6281 Muscle weakness (generalized): Secondary | ICD-10-CM

## 2016-05-27 DIAGNOSIS — R269 Unspecified abnormalities of gait and mobility: Secondary | ICD-10-CM

## 2016-05-27 DIAGNOSIS — R262 Difficulty in walking, not elsewhere classified: Secondary | ICD-10-CM

## 2016-05-27 NOTE — Therapy (Signed)
[place], [place], [address] Phone: [phone]   Fax:  [phone]  Physical Therapy Treatment  Patient Details  Name: Karla Nelson MRN: [id number] Date of Birth: [birth year] Referring Provider: Dr Blanch Media   Encounter Date: 05/27/2016      PT End of Session - 05/27/16 [place]    Visit Number 9   Number of Visits 16   Date for PT Re-Evaluation 06/04/16   PT Start Time 0803   PT Stop Time 7341   PT Time Calculation (min) 44 min   Activity Tolerance Patient tolerated treatment well   Behavior During Therapy [facility] for tasks assessed/performed      Past Medical History:  Diagnosis Date  . Asthma   . CAD (coronary artery disease)    NSTEMI with cath in August 2012 with nonobstructive disease, 50% ostial D1 and 70% distal LCX and PL disease. Normal EF. Has diastolic dysfunction with elevated EDP  . Cleft palate    special denture covers defect like a C, sleeps with device in place  . COPD (chronic obstructive pulmonary disease) (HCC)    o2 and bedtime   . DYSPNEA ON EXERTION 03/08/2007  . ELECTROCARDIOGRAM, ABNORMAL 02/13/2010  . History of oxygen administration    bedtime  . HTN (hypertension)    Has normal renal arteries.  . Hyperlipidemia   . NSTEMI (non-ST elevated myocardial infarction) ([place]) 09-25-2010  . Stroke [facility])    july 2016, Dr Donnella Sham  . Tobacco abuse disorder     Past Surgical History:  Procedure Laterality Date  . CARDIAC CATHETERIZATION  Aug 2012   Moderate nonobstructive multivessel CAD with an EF of 70 to 75%  . CESAREAN SECTION    . CORONARY ANGIOPLASTY     no further intervention last cardiac visit greater than a year    There were no vitals filed for this visit.                       [place] Adult PT Treatment/Exercise - 05/27/16 0001      Self-Care   Self-Care --  showed patient how to construct "Button" to decrease tone     Lumbar Exercises:  Stretches   Lower Trunk Rotation Limitations 2-3 sets between  other exercises to reduce stiffness (Tone?)   Standing Side Bend 2 reps;20 seconds  to right only   Piriformis Stretch Limitations 3 X 20 seconds     Lumbar Exercises: Supine   Bridge 10 reps   Bridge Limitations single difficule     Knee/Hip Exercises: Supine   Bridges Limitations 10 X both 5 X with legs on ball with hamstring curl, difficult.   Other Supine Knee/Hip Exercises PNF D1, D2 AA then progressed to light resistance,  left     Manual Therapy   Manual Therapy Scapular mobilization   Soft tissue mobilization roller at edge of bed to distal quads.                PT Education - 05/27/16 1828    Education provided Yes   Education Details cardboard "Button" to decrease foot tone.   Person(s) Educated Patient   Methods Explanation;Demonstration;Verbal cues   Comprehension Verbalized understanding          PT Short Term Goals - 05/25/16 0900      PT SHORT TERM GOAL #1   Title Patient will increase right signle leg stance by 5 seconds    Baseline 10  seconds with some instability    Time 4   Period Weeks   Status Achieved     PT SHORT TERM GOAL #2   Title Patrient will increase right lower extremity strength to 5/5    Baseline not tested    Time 4   Period Weeks   Status On-going     PT SHORT TERM GOAL #3   Title Patien's right hamstring length will be 90 degrees    Baseline 90 degrees 4/12    Time 4   Period Weeks   Status On-going     PT SHORT TERM GOAL #4   Title Patient will be independent with inital HEP    Baseline minor cues   Time 4   Period Weeks   Status On-going           PT Long Term Goals - 04/09/16 1239      PT LONG TERM GOAL #1   Title Patient will show a 42% limitation with FOTO   Baseline No HEP   Time 8   Period Weeks   Status On-going     PT LONG TERM GOAL #2   Title Patient will ambualte 1000' without antalgic gait    Time 8   Period Weeks   Status  New     PT LONG TERM GOAL #3   Title Patient will be independent with HEP    Time 8   Period Weeks   Status New               Plan - 05/27/16 1829    Clinical Impression Statement PNF a challange for LE's.  Distal hanstring now mostly where she has stiffness.  Trial of cardboard "Button" taped to her arch to decrease tone with weight bearing,   PT Next Visit Plan Assess cardboard button.  Continue strengthening with PNF.  FOTO did not do today.   PT Home Exercise Plan SLR; Bridging, hip abduction, hamstring stretch prayer stretch, lateral prayer stretch gastroc stretch, Balance exercises in corner   Consulted and Agree with Plan of Care Patient      Patient will benefit from skilled therapeutic intervention in order to improve the following deficits and impairments:  Decreased strength, Decreased mobility, Decreased endurance, Decreased activity tolerance, Pain, Increased muscle spasms, Increased fascial restricitons  Visit Diagnosis: Other abnormalities of gait and mobility  Muscle weakness (generalized)  Difficulty walking  Right leg weakness  Abnormality of gait     Problem List Patient Active Problem List   Diagnosis Date Noted  . Spastic hemiplegia affecting nondominant side ([place]) 03/26/2015  . Smoking 12/30/2014  . Chronic obstructive pulmonary disease ([place]) 12/30/2014  . Lacunar infarct, acute ([place]) 12/04/2014  . Acute ischemic stroke ([place]) 09/01/2014  . Stroke ([place]) 09/01/2014  . Right sided weakness   . Smoker 03/15/2014  . History of cocaine use 03/15/2014  . Asthma, moderate persistent 07/26/2013  . 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 Acute sinusitis 04/11/2013  . NSTEMI (non-ST elevated myocardial infarction) ([place]) 06/19/2012  . Acute-on-chronic kidney injury ([place]) 06/19/2012  . Community acquired pneumonia 06/16/2012  . COPD exacerbation ([place]) 12/05/2011  . COPD (chronic obstructive pulmonary disease) ([place]) 07/12/2011  . CAD (coronary artery  disease) 01/13/2011  . Tobacco abuse 01/13/2011  . Mixed hyperlipidemia 10/13/2010  . ELECTROCARDIOGRAM, ABNORMAL 02/13/2010  . CLEFT PALATE 02/12/2010  . Essential hypertension 03/08/2007  . DYSPNEA ON EXERTION 03/08/2007    HARRIS,KAREN PTA 05/27/2016, 6:33 PM  [facility] [address]  [place], [place], [address] Phone: [phone]   Fax:  [phone]  Name: Karla Nelson MRN: [id number] Date of Birth: 1965/06/12

## 2016-06-01 ENCOUNTER — Encounter: Payer: Self-pay | Admitting: Physical Therapy

## 2016-06-01 ENCOUNTER — Ambulatory Visit: Payer: Medicare Other | Admitting: Physical Therapy

## 2016-06-01 DIAGNOSIS — R2689 Other abnormalities of gait and mobility: Secondary | ICD-10-CM

## 2016-06-01 DIAGNOSIS — R262 Difficulty in walking, not elsewhere classified: Secondary | ICD-10-CM

## 2016-06-01 DIAGNOSIS — R269 Unspecified abnormalities of gait and mobility: Secondary | ICD-10-CM

## 2016-06-01 DIAGNOSIS — R29898 Other symptoms and signs involving the musculoskeletal system: Secondary | ICD-10-CM

## 2016-06-01 DIAGNOSIS — M6281 Muscle weakness (generalized): Secondary | ICD-10-CM

## 2016-06-01 NOTE — Therapy (Signed)
[place], [place], [address] Phone: [phone]   Fax:  [phone]  Physical Therapy Treatment  Patient Details  Name: Karla Nelson MRN: [id number] Date of Birth: 11-May-1965 Referring Provider: Dr Blanch Media   Encounter Date: 06/01/2016      PT End of Session - 06/01/16 0808    Visit Number 10   Number of Visits 16   Date for PT Re-Evaluation 06/04/16   Authorization Type Medicare   PT Start Time 0802   PT Stop Time 0845   PT Time Calculation (min) 43 min   Activity Tolerance Patient tolerated treatment well   Behavior During Therapy [facility] for tasks assessed/performed      Past Medical History:  Diagnosis Date  . Asthma   . CAD (coronary artery disease)    NSTEMI with cath in August 2012 with nonobstructive disease, 50% ostial D1 and 70% distal LCX and PL disease. Normal EF. Has diastolic dysfunction with elevated EDP  . Cleft palate    special denture covers defect like a C, sleeps with device in place  . COPD (chronic obstructive pulmonary disease) (HCC)    o2 and bedtime   . DYSPNEA ON EXERTION 03/08/2007  . ELECTROCARDIOGRAM, ABNORMAL 02/13/2010  . History of oxygen administration    bedtime  . HTN (hypertension)    Has normal renal arteries.  . Hyperlipidemia   . NSTEMI (non-ST elevated myocardial infarction) ([place]) 09-25-2010  . Stroke [facility])    july 2016, Dr Donnella Sham  . Tobacco abuse disorder     Past Surgical History:  Procedure Laterality Date  . CARDIAC CATHETERIZATION  Aug 2012   Moderate nonobstructive multivessel CAD with an EF of 70 to 75%  . CESAREAN SECTION    . CORONARY ANGIOPLASTY     no further intervention last cardiac visit greater than a year    There were no vitals filed for this visit.      Subjective Assessment - 06/01/16 0806    Subjective Patient continues to report improvements. She feel like she is moving better wih less pain. She has been to her  doctor who is going to have her go see a knee specialist.    Pertinent History Stroke 2016    Limitations Standing;Lifting;Walking   How long can you stand comfortably? < 10 minutes    How long can you walk comfortably? limited communtiy distances   Diagnostic tests X-ray and MRI done at [facility] ortho    Patient Stated Goals To have less pain    Currently in Pain? No/denies                         [facility] Adult PT Treatment/Exercise - 06/01/16 0001      Lumbar Exercises: Stretches   Lower Trunk Rotation Limitations 2x10   Piriformis Stretch Limitations 3 X 20 seconds     Lumbar Exercises: Supine   Bridge Limitations single leg x5 each leg; regular x10;    Straight Leg Raises Limitations 2x10    Other Supine Lumbar Exercises DKTC with ball 2x10    Other Supine Lumbar Exercises clam shells 2x10 green      Knee/Hip Exercises: Stretches   Passive Hamstring Stretch 3 reps;30 seconds     Knee/Hip Exercises: Supine   Other Supine Knee/Hip Exercises PNF D1, D2 AA then progressed to light resistance,  left     Manual Therapy   Soft tissue mobilization gentile rolling to distal  hamstrings to reduce tone                PT Education - 06/01/16 0807    Education provided Yes   Education Details importance of strengthening    Person(s) Educated Patient   Methods Explanation;Demonstration;Verbal cues   Comprehension Verbalized understanding          PT Short Term Goals - 06/01/16 0813      PT SHORT TERM GOAL #1   Title Patient will increase right signle leg stance by 5 seconds    Baseline 10 seconds with some instability    Time 4   Period Weeks   Status Achieved     PT SHORT TERM GOAL #2   Title Patrient will increase right lower extremity strength to 5/5    Baseline will test next visit    Time 4   Period Weeks   Status On-going     PT SHORT TERM GOAL #3   Title Patien's right hamstring length will be 90 degrees    Baseline 90 degrees 4/12     Time 4   Period Weeks   Status Achieved     PT SHORT TERM GOAL #4   Title Patient will be independent with inital HEP    Baseline independent    Time 4   Period Weeks   Status Achieved           PT Long Term Goals - 04/09/16 1239      PT LONG TERM GOAL #1   Title Patient will show a 42% limitation with FOTO   Baseline No HEP   Time 8   Period Weeks   Status On-going     PT LONG TERM GOAL #2   Title Patient will ambualte 1000' without antalgic gait    Time 8   Period Weeks   Status New     PT LONG TERM GOAL #3   Title Patient will be independent with HEP    Time 8   Period Weeks   Status New               Plan - 06/01/16 0809    Clinical Impression Statement Patient contiues to have some tension behind hrer knee with certain activity's. She is otherwwsie progressing well. She will likley discharge next visit.    Rehab Potential Good   Clinical Impairments Affecting Rehab Potential stroke 2016   PT Frequency 2x / week   PT Duration 8 weeks   PT Treatment/Interventions ADLs/Self Care Home Management;Cryotherapy;Electrical Stimulation;Iontophoresis 4mg /ml Dexamethasone;Moist Heat;Stair training;Gait training;Functional mobility training;Orthotic Fit/Training;Patient/family education;Therapeutic exercise;Therapeutic activities;Manual techniques;Passive range of motion;Dry needling;Taping;Neuromuscular re-education   PT Next Visit Plan Assess cardboard button.  Continue strengthening with PNF.  FOTO did not do today.   PT Home Exercise Plan SLR; Bridging, hip abduction, hamstring stretch prayer stretch, lateral prayer stretch gastroc stretch, Balance exercises in corner   Consulted and Agree with Plan of Care Patient      Patient will benefit from skilled therapeutic intervention in order to improve the following deficits and impairments:  Decreased strength, Decreased mobility, Decreased endurance, Decreased activity tolerance, Pain, Increased muscle spasms,  Increased fascial restricitons  Visit Diagnosis: Other abnormalities of gait and mobility  Muscle weakness (generalized)  Difficulty walking  Right leg weakness  Abnormality of gait     Problem List Patient Active Problem List   Diagnosis Date Noted  . Spastic hemiplegia affecting nondominant side ([place]) 03/26/2015  . Smoking 12/30/2014  . Chronic  obstructive pulmonary disease ([place]) 12/30/2014  . Lacunar infarct, acute ([place]) 12/04/2014  . Acute ischemic stroke ([place]) 09/01/2014  . Stroke ([place]) 09/01/2014  . Right sided weakness   . Smoker 03/15/2014  . History of cocaine use 03/15/2014  . Asthma, moderate persistent 07/26/2013  . 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 Acute sinusitis 04/11/2013  . NSTEMI (non-ST elevated myocardial infarction) ([place]) 06/19/2012  . Acute-on-chronic kidney injury ([place]) 06/19/2012  . Community acquired pneumonia 06/16/2012  . COPD exacerbation ([place]) 12/05/2011  . COPD (chronic obstructive pulmonary disease) ([place]) 07/12/2011  . CAD (coronary artery disease) 01/13/2011  . Tobacco abuse 01/13/2011  . Mixed hyperlipidemia 10/13/2010  . ELECTROCARDIOGRAM, ABNORMAL 02/13/2010  . CLEFT PALATE 02/12/2010  . Essential hypertension 03/08/2007  . DYSPNEA ON EXERTION 03/08/2007    Carney Living PT DPT  06/01/2016, 10:45 AM  [facility] [address] [place], [place], [address] Phone: [phone]   Fax:  [phone]  Name: Virjean Boman MRN: [id number] Date of Birth: 01-Nov-1965

## 2016-06-03 ENCOUNTER — Encounter: Payer: Self-pay | Admitting: Nurse Practitioner

## 2016-06-03 ENCOUNTER — Ambulatory Visit: Payer: Medicare Other | Admitting: Physical Therapy

## 2016-06-03 ENCOUNTER — Ambulatory Visit (INDEPENDENT_AMBULATORY_CARE_PROVIDER_SITE_OTHER): Payer: Medicare Other | Admitting: Nurse Practitioner

## 2016-06-03 ENCOUNTER — Encounter: Payer: Self-pay | Admitting: Physical Therapy

## 2016-06-03 VITALS — BP 148/92 | HR 84 | Wt 148.8 lb

## 2016-06-03 DIAGNOSIS — R2689 Other abnormalities of gait and mobility: Secondary | ICD-10-CM | POA: Diagnosis not present

## 2016-06-03 DIAGNOSIS — Z720 Tobacco use: Secondary | ICD-10-CM | POA: Diagnosis not present

## 2016-06-03 DIAGNOSIS — R29898 Other symptoms and signs involving the musculoskeletal system: Secondary | ICD-10-CM

## 2016-06-03 DIAGNOSIS — R262 Difficulty in walking, not elsewhere classified: Secondary | ICD-10-CM

## 2016-06-03 DIAGNOSIS — I10 Essential (primary) hypertension: Secondary | ICD-10-CM | POA: Diagnosis not present

## 2016-06-03 DIAGNOSIS — E782 Mixed hyperlipidemia: Secondary | ICD-10-CM

## 2016-06-03 DIAGNOSIS — Z8673 Personal history of transient ischemic attack (TIA), and cerebral infarction without residual deficits: Secondary | ICD-10-CM | POA: Diagnosis not present

## 2016-06-03 DIAGNOSIS — R269 Unspecified abnormalities of gait and mobility: Secondary | ICD-10-CM

## 2016-06-03 DIAGNOSIS — M6281 Muscle weakness (generalized): Secondary | ICD-10-CM

## 2016-06-03 NOTE — Progress Notes (Signed)
[facility]  PATIENT: Karla Nelson DOB: 1966/01/16   REASON FOR VISIT: Follow-up for hemiplegic stroke HISTORY FROM: Patient    HISTORY OF PRESENT ILLNESS: [name] is a 51 year Caucasian lady who seen today for the first office follow-up visit following hospital admission for stroke in July 2016.Karla Nelson is a 51 y.o. female hx of HTN, NSTEMI, HLD, and cocaine usage presenting with 3 day history of right sided weakness. Notes symptoms started after an argument with her mother in law. Started in her RUE and now also involves RLE. Denies any sensory deficits. No visual or speech deficits.  MRI brain imaging  Personally reviewed, showed acute infarct in left posterior limb of internal capsule.  Date last known well: 08/29/2014 Time last known well: unclear tPA Given: no, outside tPA window Modified Rankin: Rankin Score=0. LDL cholesterol was elevated at 113 mg percent and hemoglobin A1c was 6.9. Transthoracic echo showed normal ejection fraction and carotid ultrasound showed no significant extra-axial stenosis. 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 Patient was counseled to quit smoking and drugs and started on aspirin. She states she's done well. Her right-sided weakness has improved only intermittently she will drag her right leg. She's been suffering from a cold for the last few days and plans to see her primary care physician for that soon. She continues to smoke and has not yet quit smoking but wants to discuss smoking cessation options with her primary physician. She is back to work and works part-time but has to work at the [employer]. Blood pressure is well controlled and today it is 144/83. She is tolerating Lipitor without myalgias or arthralgias. Update 03/26/2015 PS: She returns for follow-up after last visit 3 months ago. She has been complaining of increasing spasms in her right leg and cramps. She initially felt this was related to  aching potassium which she has discontinued but trouble walking, leg cramps and spasms continue. She was started by her primary physician and Zanaflex but she takes it only once a day and it doesn't work as well. She was seen in the emergency room and had a CT scan of the head last month which showed no acute abnormality and old left basal ganglia infarct. She was unable to return to work at [employer] as she had to be on her feet all day and she had trouble walking with that. She states she's lost about 20 pounds and is eating healthy. She remains on aspirin which is tolerating well. She has not yet quit smoking but plans to do so. She states her blood pressure is under good control though it is elevated today at 153/87. Update 4/26/2017PS: Patient is seen today for follow-up after last visit 2 months ago. This appointment had previously been made a year ago and was not canceled. She states she continues to do well from stroke standpoint without recurrent neurovascular symptoms. She has developed a stye in the left eye for the last 3 days and numbness using some eyedrops. She continues to have right leg spasms and pain. She has seen Dr. Posey Pronto from neuro rehabilitation who has started baclofen and plans to use Botox next week. She does do stretching of her leg but didn't help as much. She has successfully quit smoking 1 month ago. She is tolerating aspirin without bleeding or bruising. She states her blood pressure is usually quite good though it is elevated at 146/87 in office today.     UPDATE 12/04/15 CM Karla Nelson, 51 year old  female returns for follow-up. She has a history of stroke in July 2016. She is currently on aspirin for secondary stroke prevention without significant bruising or bleeding. Blood pressure elevated in the office today at 150/85. Lipids are followed by her primary care Dr. Nancy Fetter. she remains on Lipitor she is no longer seeing Dr. Posey Pronto neuro rehabilitation and she is no longer taking  baclofen. She claims she is doing some stretching exercises. She has started back smoking and was encouraged to quit. She returns for reevaluation  UPDATE 04/26/2018CM Karla Nelson, 51 year old female returns for follow-up with history of stroke in July 2016. She is currently on aspirin for secondary stroke prevention without further stroke or TIA symptoms in addition she is on Lipitor for hyperlipidemia without complaints of myalgias. Blood pressure in the office today 148/92. Her labs are followed by Dr. Nancy Fetter primary care. She had an ER visit February 07, 2016  for COPD exacerbation. She received some physical therapy after that that has concluded. She continues to do her home exercise program. She is doing stretching and flexibility exercises. She does at [employer]. She is still smoking and was encouraged to quit. She is back to her regular activities. She is driving without difficulty She returns for reevaluation   REVIEW OF SYSTEMS: Full 14 system review of systems performed and notable only for those listed, all others are neg:  Constitutional: neg  Cardiovascular: neg Ear/Nose/Throat: neg  Skin: neg Eyes: neg Respiratory: neg Gastroitestinal: neg  Hematology/Lymphatic: neg  Endocrine: neg Musculoskeletal: Walking difficulty Allergy/Immunology: neg Neurological: neg Psychiatric: Anxiety Sleep : neg   ALLERGIES: No Known Allergies  HOME MEDICATIONS: Outpatient Medications Prior to Visit  Medication Sig Dispense Refill  . amLODipine (NORVASC) 10 MG tablet Take 10 mg by mouth at bedtime.  12  . aspirin 325 MG tablet Take 1 tablet (325 mg total) by mouth daily.    Marland Kitchen atorvastatin (LIPITOR) 20 MG tablet Take 1 tablet (20 mg total) by mouth daily at 6 PM. 30 tablet 1  . hydrALAZINE (APRESOLINE) 25 MG tablet Take 25 mg by mouth 2 (two) times daily.    Marland Kitchen labetalol (NORMODYNE) 300 MG tablet Take 300 mg by mouth 2 (two) times daily.    Marland Kitchen METHADONE HCL PO Take 18 mg by mouth daily.      . Multiple Vitamin (MULTIVITAMIN WITH MINERALS) TABS tablet Take 1 tablet by mouth daily.    . Omega-3 Fatty Acids (FISH OIL PO) Take 1 capsule by mouth every morning.    . predniSONE (DELTASONE) 50 MG tablet Take 1 pill daily for 5 days. (Patient not taking: Reported on 06/03/2016) 5 tablet 0  . albuterol (PROVENTIL HFA;VENTOLIN HFA) 108 (90 Base) MCG/ACT inhaler Inhale 2 puffs into the lungs every 4 (four) hours as needed for wheezing or shortness of breath. 1 Inhaler 0  . doxycycline (VIBRAMYCIN) 100 MG capsule Take 1 capsule (100 mg total) by mouth 2 (two) times daily. (Patient not taking: Reported on 06/03/2016) 20 capsule 0   No facility-administered medications prior to visit.     PAST MEDICAL HISTORY: Past Medical History:  Diagnosis Date  . Asthma   . CAD (coronary artery disease)    NSTEMI with cath in August 2012 with nonobstructive disease, 50% ostial D1 and 70% distal LCX and PL disease. Normal EF. Has diastolic dysfunction with elevated EDP  . Cleft palate    special denture covers defect like a C, sleeps with device in place  . COPD (chronic obstructive pulmonary  disease) ([place])    o2 and bedtime   . DYSPNEA ON EXERTION 03/08/2007  . ELECTROCARDIOGRAM, ABNORMAL 02/13/2010  . History of oxygen administration    bedtime  . HTN (hypertension)    Has normal renal arteries.  . Hyperlipidemia   . NSTEMI (non-ST elevated myocardial infarction) ([place]) 09-25-2010  . Stroke [facility])    july 2016, Dr Donnella Sham  . Tobacco abuse disorder     PAST SURGICAL HISTORY: Past Surgical History:  Procedure Laterality Date  . CARDIAC CATHETERIZATION  Aug 2012   Moderate nonobstructive multivessel CAD with an EF of 70 to 75%  . CESAREAN SECTION    . CORONARY ANGIOPLASTY     no further intervention last cardiac visit greater than a year    FAMILY HISTORY: Family History  Problem Relation Age of Onset  . Asthma Mother   . Heart disease Mother   . Hypertension Mother   . Asthma Brother     . Diabetes    . Hypertension      SOCIAL HISTORY: Social History   Social History  . Marital status: Single    Spouse name: N/A  . Number of children: N/A  . Years of education: N/A   Occupational History  . Not on file.   Social History Main Topics  . Smoking status: Current Every Day Smoker    Packs/day: 0.50    Years: 31.00    Types: Cigarettes    Last attempt to quit: 04/18/2015  . Smokeless tobacco: Never Used     Comment: 06/03/16 started back < 1/2 PPD  . Alcohol use No  . Drug use: No     Comment: former drug use  . Sexual activity: Yes   Other Topics Concern  . Not on file   Social History Narrative  . No narrative on file     PHYSICAL EXAM  Vitals:   06/03/16 1020  BP: (!) 148/92  Pulse: 84  Weight: 148 lb 12.8 oz (67.5 kg)   Body mass index is 24.76 kg/m.   Generalized: Well developed, in no acute distress  Head: normocephalic and atraumatic,. Oropharynx benign  Neck: Supple, no carotid bruits  Cardiac: Regular rate rhythm, no murmur  Musculoskeletal: No deformity   Neurological examination   Mentation: Alert oriented to time, place, history taking. Attention span and concentration appropriate. Recent and remote memory intact.  Follows all commands speech and language fluent.   Cranial nerve II-XII: Pupils were equal round reactive to light extraocular movements were full, visual field were full on confrontational test. Facial sensation and strength were normal. hearing was intact to finger rubbing bilaterally. Uvula tongue midline. head turning and shoulder shrug were normal and symmetric.Tongue protrusion into cheek strength was normal. Motor: normal bulk and tone, full strength in the BUE, BLE, except mild weakness in right lower extremity. Sensory: normal and symmetric to light touch, pinprick, and  Vibration, in the upper and lower extremities  Coordination: finger-nose-finger, heel-to-shin bilaterally, no dysmetria Reflexes:  Brachioradialis 2/2, biceps 2/2, triceps 2/2, patellar 2/2, Achilles 2/2, plantar responses were flexor bilaterally. Gait and Station: Rising up from seated position without assistance, normal stance,  gait demonstrates mild right leg spasticity, can heel and toe walk. Tandem gait mildly unsteady no assistive device  DIAGNOSTIC DATA (LABS, IMAGING, TESTING) - I reviewed patient records, labs, notes, testing and imaging myself where available.  Lab Results  Component Value Date   WBC 8.4 02/14/2015   HGB 13.9 02/14/2015   HCT 41.0 02/14/2015  MCV 90.0 02/14/2015   PLT 237 02/14/2015      Component Value Date/Time   NA 139 02/14/2015 1933   K 3.8 02/14/2015 1933   CL 99 (L) 02/14/2015 1933   CO2 26 02/14/2015 1906   GLUCOSE 149 (H) 02/14/2015 1933   BUN 38 (H) 02/14/2015 1933   CREATININE 1.80 (H) 02/14/2015 1933   CALCIUM 9.9 02/14/2015 1906   PROT 7.0 02/14/2015 1906   ALBUMIN 4.1 02/14/2015 1906   AST 22 02/14/2015 1906   ALT 16 02/14/2015 1906   ALKPHOS 70 02/14/2015 1906   BILITOT 0.4 02/14/2015 1906   GFRNONAA 29 (L) 02/14/2015 1906   GFRAA 34 (L) 02/14/2015 1906   Lab Results  Component Value Date   CHOL 171 09/01/2014   HDL 33 (L) 09/01/2014   LDLCALC 113 (H) 09/01/2014   TRIG 127 09/01/2014   CHOLHDL 5.2 09/01/2014   Lab Results  Component Value Date   HGBA1C 6.9 (H) 09/01/2014      ASSESSMENT AND PLAN 50 year patient with left internal capsule infarct in July 2016 secondary to small vessel disease with risk factors of smoking, hypertension and hyperlipidemia.. Right leg spasms and mild difficulty walking due to post stroke spasticity which has improved.    PLAN: Continue aspirin 325 mg daily  for secondary stroke prevention  maintain strict control of hypertension with blood pressure goal below 130/90, todays reading 148/92 Continue B/P medications  lipids with LDL cholesterol goal below 70 mg/dL.Continue Lipitor Eat a healthy diet with plenty of  whole grains, cereals, fruits and vegetables, exercise regularly  REMEMBER Pneumonic FAST which stands for  F stands  for face drooping and weakness etc. A stands for arms, weakness S stands for speech slurred  T stands for time to call 911 Will discharge I spent 25 min  in total face to face time with the patient more than 50% of which was spent counseling and coordination of care, reviewing test results reviewing medications and discussing and reviewing the diagnosis of stroke and prevention of further stroke. She was encouraged to quit smoking completely. Maintain a healthy weight,  keep blood pressure in good control , cholesterol levels in good control , limit alcohol consumption to no more than 1 drink per day for women and continue to be active by going to the gym every day. Dennie Bible, [facility], [facility], APRN  [facility] [address] [place], [place] [address] [phone]

## 2016-06-03 NOTE — Patient Instructions (Addendum)
Continue aspirin 325 mg daily  for secondary stroke prevention  maintain strict control of hypertension with blood pressure goal below 130/90, todays reading 148/92 Continue B/P medications  lipids with LDL cholesterol goal below 70 mg/dL.Continue Lipitor Eat a healthy diet with plenty of whole grains, cereals, fruits and vegetables, exercise regularly  REMEMBER Pneumonic FAST which stands for  F stands  for face drooping and weakness etc. A stands for arms, weakness S stands for speech slurred  T stands for time to call 911 Will discharge  Stroke Prevention Some health problems and behaviors may make it more likely for you to have a stroke. Below are ways to lessen your risk of having a stroke.  Be active for at least 30 minutes on most or all days.  Do not smoke. Try not to be around others who smoke.  Do not drink too much alcohol.  Do not have more than 2 drinks a day if you are a man.  Do not have more than 1 drink a day if you are a woman and are not pregnant.  Eat healthy foods, such as fruits and vegetables. If you were put on a specific diet, follow the diet as told.  Keep your cholesterol levels under control through diet and medicines. Look for foods that are low in saturated fat, trans fat, cholesterol, and are high in fiber.  If you have diabetes, follow all diet plans and take your medicine as told.  Ask your doctor if you need treatment to lower your blood pressure. If you have high blood pressure (hypertension), follow all diet plans and take your medicine as told by your doctor.  If you are 21-9 years old, have your blood pressure checked every 3-5 years. If you are age 70 or older, have your blood pressure checked every year.  Keep a healthy weight. Eat foods that are low in calories, salt, saturated fat, trans fat, and cholesterol.  Do not take drugs.  Avoid birth control pills, if this applies. Talk to your doctor about the risks of taking birth control  pills.  Talk to your doctor if you have sleep problems (sleep apnea).  Take all medicine as told by your doctor.  You may be told to take aspirin or blood thinner medicine. Take this medicine as told by your doctor.  Understand your medicine instructions.  Make sure any other conditions you have are being taken care of. Get help right away if:  You suddenly lose feeling (you feel numb) or have weakness in your face, arm, or leg.  Your face or eyelid hangs down to one side.  You suddenly feel confused.  You have trouble talking (aphasia) or understanding what people are saying.  You suddenly have trouble seeing in one or both eyes.  You suddenly have trouble walking.  You are dizzy.  You lose your balance or your movements are clumsy (uncoordinated).  You suddenly have a very bad headache and you do not know the cause.  You have new chest pain.  Your heart feels like it is fluttering or skipping a beat (irregular heartbeat). Do not wait to see if the symptoms above go away. Get help right away. Call your local emergency services (911 in [place] Do not drive yourself to the hospital. This information is not intended to replace advice given to you by your health care provider. Make sure you discuss any questions you have with your health care provider. Document Released: 07/27/2011 Document Revised: 07/03/2015 Document Reviewed:  07/28/2012 Elsevier Interactive Patient Education  2017 [employer].

## 2016-06-03 NOTE — Therapy (Signed)
[place], [place], [address] Phone: [phone]   Fax:  [phone]  Physical Therapy Treatment/ Discharge   Patient Details  Name: Karla Nelson MRN: [id number] Date of Birth: Mar 08, 1965 Referring Provider: Dr Blanch Media   Encounter Date: 06/03/2016      PT End of Session - 06/03/16 0804    Visit Number 11   Number of Visits 16   Date for PT Re-Evaluation 06/04/16   Authorization Type Medicare   PT Start Time 0758   PT Stop Time 0842   PT Time Calculation (min) 44 min   Activity Tolerance Patient tolerated treatment well   Behavior During Therapy [facility] for tasks assessed/performed      Past Medical History:  Diagnosis Date  . Asthma   . CAD (coronary artery disease)    NSTEMI with cath in August 2012 with nonobstructive disease, 50% ostial D1 and 70% distal LCX and PL disease. Normal EF. Has diastolic dysfunction with elevated EDP  . Cleft palate    special denture covers defect like a C, sleeps with device in place  . COPD (chronic obstructive pulmonary disease) (HCC)    o2 and bedtime   . DYSPNEA ON EXERTION 03/08/2007  . ELECTROCARDIOGRAM, ABNORMAL 02/13/2010  . History of oxygen administration    bedtime  . HTN (hypertension)    Has normal renal arteries.  . Hyperlipidemia   . NSTEMI (non-ST elevated myocardial infarction) ([place]) 09-25-2010  . Stroke [facility])    july 2016, Dr Donnella Sham  . Tobacco abuse disorder     Past Surgical History:  Procedure Laterality Date  . CARDIAC CATHETERIZATION  Aug 2012   Moderate nonobstructive multivessel CAD with an EF of 70 to 75%  . CESAREAN SECTION    . CORONARY ANGIOPLASTY     no further intervention last cardiac visit greater than a year    There were no vitals filed for this visit.      Subjective Assessment - 06/03/16 0802    Subjective Patient continues to feel like she is having a catch in her knee. She is walking bette and having less  pain in her hip.    Limitations Standing;Lifting;Walking   How long can you stand comfortably? < 10 minutes    How long can you walk comfortably? limited communtiy distances   Diagnostic tests X-ray and MRI done at [facility] ortho    Patient Stated Goals To have less pain    Currently in Pain? No/denies            [facility] PT Assessment - 06/03/16 0001      Strength   Right Hip Flexion 4/5   Right Hip ADduction 4+/5   Left Hip Flexion 5/5   Left Hip Extension 5/5   Left Hip ABduction 5/5   Right Knee Flexion 4+/5   Right Knee Extension 5/5   Right Ankle Dorsiflexion 4+/5   Right Ankle Eversion 5/5     Palpation   Palpation comment No tenderness to palpation on the left      High Level Balance   High Level Balance Comments Improved single leg balance                      [facility] Adult PT Treatment/Exercise - 06/03/16 0001      Lumbar Exercises: Stretches   Lower Trunk Rotation Limitations 2x10   Standing Side Bend 2 reps;20 seconds  to right only   Piriformis Stretch Limitations  3 X 20 seconds     Lumbar Exercises: Supine   Bridge Limitations single leg x5 each leg; regular x10;    Straight Leg Raises Limitations 2x10    Other Supine Lumbar Exercises DKTC with ball 2x10    Other Supine Lumbar Exercises clam shells 2x10 green      Knee/Hip Exercises: Stretches   Passive Hamstring Stretch 3 reps;30 seconds     Knee/Hip Exercises: Standing   Other Standing Knee Exercises sytandig hip 3 way 2lbs x10 each  cuing required for technique. Patient can do these exercises at the gym      Knee/Hip Exercises: Supine   Other Supine Knee/Hip Exercises PNF D1, D2 AA then progressed to light resistance,  left                PT Education - 06/03/16 0803    Education provided Yes   Education Details reviewed gym activity and HEP    Person(s) Educated Patient   Methods Explanation;Demonstration;Verbal cues   Comprehension Verbalized understanding           PT Short Term Goals - 06/03/16 1620      PT SHORT TERM GOAL #1   Title Patient will increase right signle leg stance by 5 seconds    Baseline 15 seconds    Time 4   Period Weeks   Status Achieved     PT SHORT TERM GOAL #2   Title Patrient will increase right lower extremity strength to 5/5    Baseline continues to have 4/5 hip flexion strength on the right    Time 4   Period Weeks   Status On-going     PT SHORT TERM GOAL #3   Title Patien's right hamstring length will be 90 degrees    Baseline 90 degrees 4/12    Time 4   Period Weeks   Status Achieved     PT SHORT TERM GOAL #4   Title Patient will be independent with inital HEP    Baseline independent    Time 4   Period Weeks   Status Achieved           PT Long Term Goals - 06/03/16 1621      PT LONG TERM GOAL #1   Title Patient will show a 42% limitation with FOTO   Baseline 43%    Time 8   Period Weeks   Status On-going     PT LONG TERM GOAL #2   Title Patient will ambualte 1000' without antalgic gait    Baseline Improved gait with ambualtion. No change in gait when walking 1000' still slight decrease in single leg stance on the right and self reported " catch"    Time 8   Period Weeks   Status Achieved     PT LONG TERM GOAL #3   Title Patient will be independent with HEP    Baseline independent with all exercises    Time 8   Period Weeks   Status Achieved               Plan - 06/03/16 0805    Clinical Impression Statement Patient will see a knee specalist on Saturday. She is no longer having pain in her hip. She has met most of her goals for therapy. She is comfortable with her strengthening program. D/C to HEP.    Rehab Potential Good   Clinical Impairments Affecting Rehab Potential stroke 2016   PT Frequency 2x / week  PT Duration 8 weeks   PT Treatment/Interventions ADLs/Self Care Home Management;Cryotherapy;Electrical Stimulation;Iontophoresis 21m/ml Dexamethasone;Moist Heat;Stair  training;Gait training;Functional mobility training;Orthotic Fit/Training;Patient/family education;Therapeutic exercise;Therapeutic activities;Manual techniques;Passive range of motion;Dry needling;Taping;Neuromuscular re-education   PT Next Visit Plan Assess cardboard button.  Continue strengthening with PNF.  FOTO did not do today.   PT Home Exercise Plan SLR; Bridging, hip abduction, hamstring stretch prayer stretch, lateral prayer stretch gastroc stretch, Balance exercises in corner   Consulted and Agree with Plan of Care Patient      Patient will benefit from skilled therapeutic intervention in order to improve the following deficits and impairments:  Decreased strength, Decreased mobility, Decreased endurance, Decreased activity tolerance, Pain, Increased muscle spasms, Increased fascial restricitons  Visit Diagnosis: Other abnormalities of gait and mobility  Muscle weakness (generalized)  Difficulty walking  Right leg weakness  Abnormality of gait       G-Codes - [id number]    Functional Assessment Tool Used (Outpatient Only) clinical decision making/ FOTO    Functional Limitation Mobility: Walking and moving around   Mobility: Walking and Moving Around Current Status [phone] At least 40 percent but less than 60 percent impaired, limited or restricted   Mobility: Walking and Moving Around Goal Status [phone] At least 20 percent but less than 40 percent impaired, limited or restricted   Mobility: Walking and Moving Around Discharge Status [phone] At least 20 percent but less than 40 percent impaired, limited or restricted    \ PHYSICAL THERAPY DISCHARGE SUMMARY  Visits from Start of Care:11  Current functional level related to goals / functional outcomes: No pain in her left hip; improved pain    Remaining deficits: " hitch" in right knee ; Will see orthopedic MD on Saturday  Education / Equipment: HEP  Plan: Patient agrees to discharge.  Patient goals were not  met. Patient is being discharged due to meeting the stated rehab goals.  ?????      Problem List Patient Active Problem List   Diagnosis Date Noted  . History of stroke 0May 05, 2018 . Spastic hemiplegia affecting nondominant side ([place] 03/26/2015  . Smoking 12/30/2014  . Chronic obstructive pulmonary disease ([facility] 12/30/2014  . Lacunar infarct, acute ([place] 12/04/2014  . Acute ischemic stroke ([facility] 09/01/2014  . Stroke ([place] 09/01/2014  . Right sided weakness   . Smoker 03/15/2014  . History of cocaine use 03/15/2014  . Asthma, moderate persistent 07/26/2013  . 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 Acute sinusitis 04/11/2013  . NSTEMI (non-ST elevated myocardial infarction) ([place] 06/19/2012  . Acute-on-chronic kidney injury ([place] 06/19/2012  . Community acquired pneumonia 06/16/2012  . COPD exacerbation ([facility] 12/05/2011  . COPD (chronic obstructive pulmonary disease) ([facility] [place] 07/12/2011  . CAD (coronary artery disease) 01/13/2011  . Tobacco abuse 01/13/2011  . Mixed hyperlipidemia 10/13/2010  . ELECTROCARDIOGRAM, ABNORMAL 02/13/2010  . CLEFT PALATE 02/12/2010  . Essential hypertension 03/08/2007  . DYSPNEA ON EXERTION 03/08/2007    DCarney LivingPT DPT  4May 05, 2018 4:26 PM  [facility]GGolov[address] [place] [address]Phone: [phone]  Fax:  [phone] Name: STruc WinfreeMRN: [id number] of Birth: 8Oct 17, 1967

## 2016-06-05 NOTE — Progress Notes (Signed)
I agree with the above plan 

## 2016-07-29 ENCOUNTER — Encounter (HOSPITAL_COMMUNITY): Payer: Self-pay | Admitting: Family Medicine

## 2016-07-29 ENCOUNTER — Ambulatory Visit (HOSPITAL_COMMUNITY)
Admission: EM | Admit: 2016-07-29 | Discharge: 2016-07-29 | Disposition: A | Discharge status: Home / Self Care | Payer: Medicare Other | Attending: Family Medicine | Admitting: Family Medicine

## 2016-07-29 ENCOUNTER — Other Ambulatory Visit: Payer: Self-pay | Admitting: Family Medicine

## 2016-07-29 DIAGNOSIS — S0086XA Insect bite (nonvenomous) of other part of head, initial encounter: Secondary | ICD-10-CM | POA: Diagnosis not present

## 2016-07-29 DIAGNOSIS — S40862A Insect bite (nonvenomous) of left upper arm, initial encounter: Secondary | ICD-10-CM

## 2016-07-29 DIAGNOSIS — Z1231 Encounter for screening mammogram for malignant neoplasm of breast: Secondary | ICD-10-CM

## 2016-07-29 DIAGNOSIS — W57XXXA Bitten or stung by nonvenomous insect and other nonvenomous arthropods, initial encounter: Secondary | ICD-10-CM

## 2016-07-29 DIAGNOSIS — S40861A Insect bite (nonvenomous) of right upper arm, initial encounter: Secondary | ICD-10-CM

## 2016-07-29 MED ORDER — TRIAMCINOLONE ACETONIDE 0.1 % EX CREA: 1.0000 "application " | TOPICAL_CREAM | Freq: Two times a day (BID) | CUTANEOUS | 1 refills | Status: DC

## 2016-07-29 NOTE — Discharge Instructions (Signed)
For your insect bites, I prescribed triamcinolone cream, apply to the affected area 2-3 times a day as needed. I also recommend a nonsedating antihistamine such as Claritin, or Allegra, or Zyrtec daily. This should clear the rashes, if they do not, follow up with your primary care provider in one week

## 2016-07-29 NOTE — ED Triage Notes (Signed)
Pt here for multiple insect bites to hands, face and arm.

## 2016-07-29 NOTE — ED Provider Notes (Signed)
CSN: [id number]     Arrival date & time 07/29/16  1608 History   First MD Initiated Contact with Patient 07/29/16 1651     Chief Complaint  Patient presents with  . Insect Bite   (Consider location/radiation/quality/duration/timing/severity/associated sxs/prior Treatment) Karla Nelson is a 51 y.o. female with a past history of asthma, hypertension, hyperlipidemia, NSTEMI, coronary artery disease, amongst other extensive history is, who presents to the [facility] urgent care with a chief complaint of multiple insect bites on her arms and face. Denies any fever, chills, nausea, vomiting, diarrhea, or any systemic symptoms. Denies any pain, however they are itchy, and present for 2-3 days. Started after she was working in her garden. She has not tried any over-the-counter therapies for them   The history is provided by the patient.    Past Medical History:  Diagnosis Date  . Asthma   . CAD (coronary artery disease)    NSTEMI with cath in August 2012 with nonobstructive disease, 50% ostial D1 and 70% distal LCX and PL disease. Normal EF. Has diastolic dysfunction with elevated EDP  . Cleft palate    special denture covers defect like a C, sleeps with device in place  . COPD (chronic obstructive pulmonary disease) (HCC)    o2 and bedtime   . DYSPNEA ON EXERTION 03/08/2007  . ELECTROCARDIOGRAM, ABNORMAL 02/13/2010  . History of oxygen administration    bedtime  . HTN (hypertension)    Has normal renal arteries.  . Hyperlipidemia   . NSTEMI (non-ST elevated myocardial infarction) ([place]) 09-25-2010  . Stroke [facility])    july 2016, Dr Donnella Sham  . Tobacco abuse disorder    Past Surgical History:  Procedure Laterality Date  . CARDIAC CATHETERIZATION  Aug 2012   Moderate nonobstructive multivessel CAD with an EF of 70 to 75%  . CESAREAN SECTION    . CORONARY ANGIOPLASTY     no further intervention last cardiac visit greater than a year   Family History  Problem Relation Age of Onset  .  Asthma Mother   . Heart disease Mother   . Hypertension Mother   . Asthma Brother   . Diabetes Unknown   . Hypertension Unknown    Social History  Substance Use Topics  . Smoking status: Current Every Day Smoker    Packs/day: 0.50    Years: 31.00    Types: Cigarettes    Last attempt to quit: 04/18/2015  . Smokeless tobacco: Never Used     Comment: 06/03/16 started back < 1/2 PPD  . Alcohol use No   OB History    Gravida Para Term Preterm AB Living             1   SAB TAB Ectopic Multiple Live Births                 Review of Systems  Constitutional: Negative.   HENT: Negative.   Respiratory: Negative.   Cardiovascular: Negative.   Gastrointestinal: Negative.   Musculoskeletal: Negative.   Skin: Positive for rash.  Neurological: Negative.     Allergies  Patient has no known allergies.  Home Medications   Prior to Admission medications   Medication Sig Start Date End Date Taking? Authorizing Provider  amLODipine (NORVASC) 10 MG tablet Take 10 mg by mouth at bedtime. 08/09/14   [provider]  aspirin 325 MG tablet Take 1 tablet (325 mg total) by mouth daily. 09/02/14   Delfina Redwood, MD  atorvastatin (LIPITOR) 20  MG tablet Take 1 tablet (20 mg total) by mouth daily at 6 PM. 09/02/14   Delfina Redwood, MD  hydrALAZINE (APRESOLINE) 25 MG tablet Take 25 mg by mouth 2 (two) times daily.    [provider]  labetalol (NORMODYNE) 300 MG tablet Take 300 mg by mouth 2 (two) times daily.    [provider]  METHADONE HCL PO Take 18 mg by mouth daily.     [provider]  Multiple Vitamin (MULTIVITAMIN WITH MINERALS) TABS tablet Take 1 tablet by mouth daily.    [provider]  Omega-3 Fatty Acids (FISH OIL PO) Take 1 capsule by mouth every morning.    [provider]  predniSONE (DELTASONE) 50 MG tablet Take 1 pill daily for 5 days. Patient not taking: Reported on 06/03/2016 02/07/16   Melony Overly, MD  triamcinolone  cream (KENALOG) 0.1 % Apply 1 application topically 2 (two) times daily. 07/29/16   Barnet Glasgow, NP   Meds Ordered and Administered this Visit  Medications - No data to display  BP (!) 178/89   Pulse 66   Temp 98.6 F (37 C)   Resp 18   LMP 09/09/2014 (Approximate) Comment: none since august  SpO2 98%  No data found.   Physical Exam  Constitutional: She is oriented to person, place, and time. She appears well-developed and well-nourished. No distress.  HENT:  Head: Normocephalic and atraumatic.  Right Ear: External ear normal.  Left Ear: External ear normal.  Eyes: Conjunctivae are normal.  Neck: Normal range of motion.  Neurological: She is alert and oriented to person, place, and time.  Skin: Skin is warm and dry. Capillary refill takes less than 2 seconds. Rash noted. She is not diaphoretic. No erythema.  Psychiatric: She has a normal mood and affect. Her behavior is normal.  Nursing note and vitals reviewed.   Urgent Care Course     Procedures (including critical care time)  Labs Review Labs Reviewed - No data to display  Imaging Review No results found.     MDM   1. Insect bite, initial encounter     Started  triamcinolone cream, also recommend a nonsedating antihistamine as well, follow-up with primary care as needed.    Barnet Glasgow, NP 07/29/16 [phone]

## 2016-09-02 ENCOUNTER — Ambulatory Visit
Admission: RE | Admit: 2016-09-02 | Discharge: 2016-09-02 | Disposition: A | Discharge status: Home / Self Care | Payer: Medicare Other | Source: Ambulatory Visit | Attending: Family Medicine | Admitting: Family Medicine

## 2016-09-02 DIAGNOSIS — Z1231 Encounter for screening mammogram for malignant neoplasm of breast: Secondary | ICD-10-CM

## 2016-09-22 ENCOUNTER — Encounter: Payer: Self-pay | Admitting: Neurology

## 2016-09-22 ENCOUNTER — Ambulatory Visit (INDEPENDENT_AMBULATORY_CARE_PROVIDER_SITE_OTHER): Payer: Self-pay | Admitting: Neurology

## 2016-09-22 ENCOUNTER — Ambulatory Visit (INDEPENDENT_AMBULATORY_CARE_PROVIDER_SITE_OTHER): Payer: Medicare Other | Admitting: Neurology

## 2016-09-22 DIAGNOSIS — M79604 Pain in right leg: Secondary | ICD-10-CM | POA: Diagnosis not present

## 2016-09-22 DIAGNOSIS — Z8673 Personal history of transient ischemic attack (TIA), and cerebral infarction without residual deficits: Secondary | ICD-10-CM

## 2016-09-22 NOTE — Procedures (Signed)
     HISTORY:  Karla Nelson is a 51 year old patient with a history of a prior stroke affecting the right arm. The patient has developed some discomfort in the medial aspect of the right leg from the foot to the groin area, she has a heavy sensation of the right leg and some alteration in walking. She denies any back pain. She is being evaluated for this issue. She also reports tingling in both feet.   NERVE CONDUCTION STUDIES:  Nerve conduction studies were performed on both lower extremities. The distal motor latencies and motor amplitudes for the peroneal and posterior tibial nerves were within normal limits. The nerve conduction velocities for these nerves were also normal. The H reflex latencies were normal. The sensory latencies for the peroneal nerves were within normal limits.   EMG STUDIES:  EMG study was performed on the right lower extremity:  The tibialis anterior muscle reveals 2 to 4K motor units with full recruitment. No fibrillations or positive waves were seen. The peroneus tertius muscle reveals 2 to 4K motor units with full recruitment. No fibrillations or positive waves were seen. The medial gastrocnemius muscle reveals 1 to 3K motor units with full recruitment. No fibrillations or positive waves were seen. The vastus lateralis muscle reveals 2 to 4K motor units with full recruitment. No fibrillations or positive waves were seen. The iliopsoas muscle reveals 2 to 4K motor units with full recruitment. No fibrillations or positive waves were seen. The biceps femoris muscle (long head) reveals 2 to 4K motor units with full recruitment. No fibrillations or positive waves were seen. The lumbosacral paraspinal muscles were tested at 3 levels, and revealed no abnormalities of insertional activity at all 3 levels tested. There was good relaxation.   IMPRESSION:  Nerve conduction studies done on both lower extremities were unremarkable, no evidence of a peripheral  neuropathy is seen. EMG of the right lower extremity is unremarkable without evidence of an overlying lumbosacral radiculopathy.  Jill Alexanders MD 09/22/2016 3:52 PM  [facility] [address] [place], [place] [address]  Phone [phone] Fax [phone]

## 2016-09-22 NOTE — Progress Notes (Signed)
    [place]    Nerve / Sites Muscle Latency Ref. Amplitude Ref. Rel Amp Segments Distance Velocity Ref. Area    ms ms mV mV %  cm m/s m/s mVms  L Peroneal - EDB     Ankle EDB 4.9 ?6.5 2.0 ?2.0 100 Ankle - EDB 9   5.6     Fib head EDB 11.9  1.7  85.1 Fib head - Ankle 36 52 ?44 5.7     Pop fossa EDB 13.4  1.7  97.2 Pop fossa - Fib head 10 64 ?44 5.9         Pop fossa - Ankle      R Peroneal - EDB     Ankle EDB 4.8 ?6.5 5.4 ?2.0 100 Ankle - EDB 9   16.6     Fib head EDB 12.3  4.9  91.2 Fib head - Ankle 36 48 ?44 15.9     Pop fossa EDB 14.0  4.5  90.7 Pop fossa - Fib head 9 56 ?44 14.7         Pop fossa - Ankle      L Tibial - AH     Ankle AH 3.4 ?5.8 3.2 ?4.0 100 Ankle - AH 9   6.2     Pop fossa AH 11.8  2.4  75.4 Pop fossa - Ankle 44 53 ?41 7.0  R Tibial - AH     Ankle AH 5.1 ?5.8 5.3 ?4.0 100 Ankle - AH 9   9.0     Pop fossa AH 12.7  6.7  128 Pop fossa - Ankle 42 55 ?41 13.5             SNC    Nerve / Sites Rec. Site Peak Lat Ref.  Amp Ref. Segments Distance    ms ms V V  cm  R Superficial peroneal - Ankle     Lat leg Ankle 2.9 ?4.4 11 ?6 Lat leg - Ankle 14  L Superficial peroneal - Ankle     Lat leg Ankle 3.1 ?4.4 33 ?6 Lat leg - Ankle 14         F  Wave    Nerve F Lat Ref.   ms ms  L Tibial - AH 30.4 ?56.0       H Reflex    Nerve H Lat Lat Hmax   ms ms   Left Right Ref. Left Right Ref.  Tibial - [place] 30.3 31.3 ?35.0 30.3 32.3 ?35.0

## 2016-09-22 NOTE — Progress Notes (Signed)
Please refer to EMG and nerve conduction study procedure note. 

## 2016-09-24 ENCOUNTER — Emergency Department (HOSPITAL_COMMUNITY)
Admission: EM | Admit: 2016-09-24 | Discharge: 2016-09-24 | Discharge status: Against Medical Advice | Payer: Medicare Other

## 2016-09-24 NOTE — ED Notes (Signed)
No answer when called for triage 

## 2016-09-24 NOTE — ED Notes (Signed)
Called pt multiple times for triage with no answer  

## 2016-09-24 NOTE — ED Notes (Signed)
Called multiple times for triage with no answer

## 2016-09-26 ENCOUNTER — Encounter (HOSPITAL_COMMUNITY): Payer: Self-pay | Admitting: Emergency Medicine

## 2016-09-26 ENCOUNTER — Emergency Department (HOSPITAL_COMMUNITY)
Admission: EM | Admit: 2016-09-26 | Discharge: 2016-09-26 | Discharge status: Against Medical Advice | Payer: Medicare Other | Attending: Emergency Medicine | Admitting: Emergency Medicine

## 2016-09-26 DIAGNOSIS — Z79899 Other long term (current) drug therapy: Secondary | ICD-10-CM | POA: Diagnosis not present

## 2016-09-26 DIAGNOSIS — J4540 Moderate persistent asthma, uncomplicated: Secondary | ICD-10-CM | POA: Insufficient documentation

## 2016-09-26 DIAGNOSIS — J449 Chronic obstructive pulmonary disease, unspecified: Secondary | ICD-10-CM | POA: Diagnosis not present

## 2016-09-26 DIAGNOSIS — I10 Essential (primary) hypertension: Secondary | ICD-10-CM | POA: Diagnosis not present

## 2016-09-26 DIAGNOSIS — I2510 Atherosclerotic heart disease of native coronary artery without angina pectoris: Secondary | ICD-10-CM | POA: Diagnosis not present

## 2016-09-26 DIAGNOSIS — F17210 Nicotine dependence, cigarettes, uncomplicated: Secondary | ICD-10-CM | POA: Insufficient documentation

## 2016-09-26 DIAGNOSIS — R29898 Other symptoms and signs involving the musculoskeletal system: Secondary | ICD-10-CM | POA: Diagnosis present

## 2016-09-26 DIAGNOSIS — Z7982 Long term (current) use of aspirin: Secondary | ICD-10-CM | POA: Diagnosis not present

## 2016-09-26 NOTE — ED Provider Notes (Signed)
[facility] DEPT Provider Note   CSN: [id number] Arrival date & time: 09/26/16  1655     History   Chief Complaint Chief Complaint  Patient presents with  . Leg Pain  . Arm Pain  . Numbness    HPI Karla Nelson is a 51 y.o. female.  Patient is a 51 year old female with past medical history of coronary artery disease, asthma, COPD, and stroke approximately 2 years ago. She has been experiencing difficulty with her legs for the past 2 years since her stroke. She reports feeling a pop in the back of her left leg when she walks and that her right leg "drags". She has been seen multiple times by orthopedics and neurology, however no cause has been found. She is now having numbness in her right fingertips, pain in her neck, and pain in her shoulder.  She recently had an EMG performed and these results were normal.   The history is provided by the patient.  Leg Pain   This is a new problem. Episode onset: 2 years ago. The problem occurs constantly. The problem has been gradually worsening. The pain is moderate. Associated symptoms include numbness. She has tried nothing for the symptoms. The treatment provided no relief.    Past Medical History:  Diagnosis Date  . Asthma   . CAD (coronary artery disease)    NSTEMI with cath in August 2012 with nonobstructive disease, 50% ostial D1 and 70% distal LCX and PL disease. Normal EF. Has diastolic dysfunction with elevated EDP  . Cleft palate    special denture covers defect like a C, sleeps with device in place  . COPD (chronic obstructive pulmonary disease) (HCC)    o2 and bedtime   . DYSPNEA ON EXERTION 03/08/2007  . ELECTROCARDIOGRAM, ABNORMAL 02/13/2010  . History of oxygen administration    bedtime  . HTN (hypertension)    Has normal renal arteries.  . Hyperlipidemia   . NSTEMI (non-ST elevated myocardial infarction) ([place]) 09-25-2010  . Stroke [facility])    july 2016, Dr Donnella Sham  . Tobacco abuse disorder     Patient Active  Problem List   Diagnosis Date Noted  . History of stroke 06/03/2016  . Spastic hemiplegia affecting nondominant side ([place]) 03/26/2015  . Smoking 12/30/2014  . Chronic obstructive pulmonary disease ([place]) 12/30/2014  . Lacunar infarct, acute ([place]) 12/04/2014  . Acute ischemic stroke ([place]) 09/01/2014  . Stroke ([place]) 09/01/2014  . Right sided weakness   . Smoker 03/15/2014  . History of cocaine use 03/15/2014  . Asthma, moderate persistent 07/26/2013  . 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 Acute sinusitis 04/11/2013  . NSTEMI (non-ST elevated myocardial infarction) ([place]) 06/19/2012  . Acute-on-chronic kidney injury ([place]) 06/19/2012  . Community acquired pneumonia 06/16/2012  . COPD exacerbation ([place]) 12/05/2011  . COPD (chronic obstructive pulmonary disease) ([place]) 07/12/2011  . CAD (coronary artery disease) 01/13/2011  . Tobacco abuse 01/13/2011  . Mixed hyperlipidemia 10/13/2010  . ELECTROCARDIOGRAM, ABNORMAL 02/13/2010  . CLEFT PALATE 02/12/2010  . Essential hypertension 03/08/2007  . DYSPNEA ON EXERTION 03/08/2007    Past Surgical History:  Procedure Laterality Date  . CARDIAC CATHETERIZATION  Aug 2012   Moderate nonobstructive multivessel CAD with an EF of 70 to 75%  . CESAREAN SECTION    . CORONARY ANGIOPLASTY     no further intervention last cardiac visit greater than a year    OB History    Gravida Para Term Preterm AB Living  1   SAB TAB Ectopic Multiple Live Births                   Home Medications    Prior to Admission medications   Medication Sig Start Date End Date Taking? Authorizing Provider  amLODipine (NORVASC) 10 MG tablet Take 10 mg by mouth at bedtime. 08/09/14   [provider]  aspirin 325 MG tablet Take 1 tablet (325 mg total) by mouth daily. 09/02/14   Delfina Redwood, MD  atorvastatin (LIPITOR) 20 MG tablet Take 1 tablet (20 mg total) by mouth daily at 6 PM. 09/02/14   Delfina Redwood, MD  hydrALAZINE (APRESOLINE) 25  MG tablet Take 25 mg by mouth 2 (two) times daily.    [provider]  labetalol (NORMODYNE) 300 MG tablet Take 300 mg by mouth 2 (two) times daily.    [provider]  METHADONE HCL PO Take 18 mg by mouth daily.     [provider]  Multiple Vitamin (MULTIVITAMIN WITH MINERALS) TABS tablet Take 1 tablet by mouth daily.    [provider]  Omega-3 Fatty Acids (FISH OIL PO) Take 1 capsule by mouth every morning.    [provider]  predniSONE (DELTASONE) 50 MG tablet Take 1 pill daily for 5 days. Patient not taking: Reported on 06/03/2016 02/07/16   Melony Overly, MD  triamcinolone cream (KENALOG) 0.1 % Apply 1 application topically 2 (two) times daily. 07/29/16   Barnet Glasgow, NP    Family History Family History  Problem Relation Age of Onset  . Asthma Mother   . Heart disease Mother   . Hypertension Mother   . Asthma Brother   . Diabetes Unknown   . Hypertension Unknown     Social History Social History  Substance Use Topics  . Smoking status: Current Every Day Smoker    Packs/day: 0.50    Years: 31.00    Types: Cigarettes    Last attempt to quit: 04/18/2015  . Smokeless tobacco: Never Used     Comment: 06/03/16 started back < 1/2 PPD  . Alcohol use No     Allergies   Patient has no known allergies.   Review of Systems Review of Systems  Neurological: Positive for numbness.  All other systems reviewed and are negative.    Physical Exam Updated Vital Signs BP (!) 140/96 (BP Location: Left Arm)   Pulse 87   Temp (!) 97.5 F (36.4 C) (Oral)   Resp 18   Ht 5\' 5"  (1.651 m)   Wt 66.7 kg (147 lb)   LMP 09/09/2014 (Approximate) Comment: none since august  SpO2 95%   BMI 24.46 kg/m   Physical Exam  Constitutional: She is oriented to person, place, and time. She appears well-developed and well-nourished. No distress.  HENT:  Head: Normocephalic and atraumatic.  Neck: Normal range of motion. Neck supple.    Pulmonary/Chest: Effort normal.  Musculoskeletal: Normal range of motion.  Neurological: She is alert and oriented to person, place, and time.  DTRs are 3+ and symmetrical in the patellar and Achilles tendons bilaterally. Strength is 5 out of 5 in both lower extremities. She is ambulatory without difficulty.  Skin: Skin is warm and dry. She is not diaphoretic.  Nursing note and vitals reviewed.    ED Treatments / Results  Labs (all labs ordered are listed, but only abnormal results are displayed) Labs Reviewed - No data to display  EKG  EKG Interpretation None  Radiology No results found.  Procedures Procedures (including critical care time)  Medications Ordered in ED Medications - No data to display   Initial Impression / Assessment and Plan / ED Course  I have reviewed the triage vital signs and the nursing notes.  Pertinent labs & imaging results that were available during my care of the patient were reviewed by me and considered in my medical decision making (see chart for details).  Patient presents here with complaints of weakness in her legs that has been ongoing for 2 years. The cause of this is been unexplained by multiple specialists. She presents here feeling frustrated that no cause has been found. To my exam, there are no neurologic deficits and she appears well.  I see nothing today that appears emergent. She feels as though she may need an MRI of her spine. I informed her that I did not disagree with this assessment, However see no emergent indication for this to be done today (and also told her that MRI had already left for the day). I then told her that I felt it best for her to follow up with her neurologist, orthopedist, or pcp to make these arrangements.   She then became angry, made derogatory remarks, then eloped from the emergency department. She ambulated from the ED at a brisk pace with no limp, no obvious deficit, or antalgic gait.  Final  Clinical Impressions(s) / ED Diagnoses   Final diagnoses:  None    New Prescriptions New Prescriptions   No medications on file     Veryl Speak, MD 09/26/16 [phone]

## 2016-09-26 NOTE — ED Notes (Signed)
Pt seen leaving the ED

## 2016-09-26 NOTE — ED Notes (Signed)
Pt was seen walking out of her room, she did not respond to staff members asking if she was leaving

## 2016-09-26 NOTE — ED Triage Notes (Signed)
Pt comes in with complaints of right shoulder and neck pain and leg pain with tingling and numbness.  Pt states she has seen a physician for this before and had electrotherapy done for possible peripheral neuropathy but it did not help.  Pt reports she also has lower back pain and received an epidural injection for pain relief. Has seen a orthopedic doctor.  A&O x4.  Ambulatory with some weakness.

## 2016-11-02 ENCOUNTER — Telehealth: Payer: Self-pay

## 2016-11-02 ENCOUNTER — Ambulatory Visit: Payer: Medicare Other | Admitting: Neurology

## 2016-11-02 NOTE — Telephone Encounter (Signed)
PATIENT WAS A NO SHOW FOR APPT TODAY.

## 2016-11-03 ENCOUNTER — Encounter: Payer: Self-pay | Admitting: Neurology

## 2016-11-30 ENCOUNTER — Encounter: Payer: Self-pay | Admitting: Neurology

## 2016-11-30 ENCOUNTER — Ambulatory Visit (INDEPENDENT_AMBULATORY_CARE_PROVIDER_SITE_OTHER): Payer: 59 | Admitting: Neurology

## 2016-11-30 VITALS — BP 137/84 | HR 70 | Wt 147.2 lb

## 2016-11-30 DIAGNOSIS — I6990 Unspecified sequelae of unspecified cerebrovascular disease: Secondary | ICD-10-CM

## 2016-11-30 NOTE — Patient Instructions (Signed)
I had a long discussion with the patient with regards to her remote stroke as well as chronic leg and back pain and answered questions. Continue aspirin for stroke prevention with strict control of hypertension with blood pressure goal below 130/90 and lipids with LDL cholesterol goal below 70 mg percent. Continue follow-up for her chronic back and neck pain with Dr. Rolena Infante in the spine clinic. No routine scheduled follow-up appointment with me is necessary but she may be referred back in the future as needed

## 2016-11-30 NOTE — Progress Notes (Signed)
[facility]  PATIENT: Karla Nelson DOB: 16-Jan-1966   REASON FOR VISIT: Follow-up for hemiplegic stroke HISTORY FROM: Patient    HISTORY OF PRESENT ILLNESS: Karla Nelson is a 82 year Caucasian lady who seen today for the first office follow-up visit following hospital admission for stroke in July 2016.Karla Nelson is a 51 y.o. female hx of HTN, NSTEMI, HLD, and cocaine usage presenting with 3 day history of right sided weakness. Notes symptoms started after an argument with her mother in law. Started in her RUE and now also involves RLE. Denies any sensory deficits. No visual or speech deficits.  MRI brain imaging  Personally reviewed, showed acute infarct in left posterior limb of internal capsule.  Date last known well: 08/29/2014 Time last known well: unclear tPA Given: no, outside tPA window Modified Rankin: Rankin Score=0. LDL cholesterol was elevated at 113 mg percent and hemoglobin A1c was 6.9. Transthoracic echo showed normal ejection fraction and carotid ultrasound showed no significant extra-axial stenosis. 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 Patient was counseled to quit smoking and drugs and started on aspirin. She states she's done well. Her right-sided weakness has improved only intermittently she will drag her right leg. She's been suffering from a cold for the last few days and plans to see her primary care physician for that soon. She continues to smoke and has not yet quit smoking but wants to discuss smoking cessation options with her primary physician. She is back to work and works part-time but has to work at the [employer]. Blood pressure is well controlled and today it is 144/83. She is tolerating Lipitor without myalgias or arthralgias. Update 03/26/2015 PS: She returns for follow-up after last visit 3 months ago. She has been complaining of increasing spasms in her right leg and cramps. She initially felt this was related to  aching potassium which she has discontinued but trouble walking, leg cramps and spasms continue. She was started by her primary physician and Zanaflex but she takes it only once a day and it doesn't work as well. She was seen in the emergency room and had a CT scan of the head last month which showed no acute abnormality and old left basal ganglia infarct. She was unable to return to work at [employer] as she had to be on her feet all day and she had trouble walking with that. She states she's lost about 20 pounds and is eating healthy. She remains on aspirin which is tolerating well. She has not yet quit smoking but plans to do so. She states her blood pressure is under good control though it is elevated today at 153/87. Update 4/26/2017PS: Patient is seen today for follow-up after last visit 2 months ago. This appointment had previously been made a year ago and was not canceled. She states she continues to do well from stroke standpoint without recurrent neurovascular symptoms. She has developed a stye in the left eye for the last 3 days and numbness using some eyedrops. She continues to have right leg spasms and pain. She has seen Karla Nelson from neuro rehabilitation who has started baclofen and plans to use Botox next week. She does do stretching of her leg but didn't help as much. She has successfully quit smoking 1 month ago. She is tolerating aspirin without bleeding or bruising. She states her blood pressure is usually quite good though it is elevated at 146/87 in office today.     UPDATE 12/04/15 CM Karla Nelson, 51 year old  female returns for follow-up. She has a history of stroke in July 2016. She is currently on aspirin for secondary stroke prevention without significant bruising or bleeding. Blood pressure elevated in the office today at 150/85. Lipids are followed by her primary care Karla Nelson. she remains on Lipitor she is no longer seeing Karla Nelson neuro rehabilitation and she is no longer taking  baclofen. She claims she is doing some stretching exercises. She has started back smoking and was encouraged to quit. She returns for reevaluation  UPDATE 04/26/2018CM Karla Nelson, 51 year old female returns for follow-up with history of stroke in July 2016. She is currently on aspirin for secondary stroke prevention without further stroke or TIA symptoms in addition she is on Lipitor for hyperlipidemia without complaints of myalgias. Blood pressure in the office today 148/92. Her labs are followed by Karla Nelson primary care. She had an ER visit February 07, 2016  for COPD exacerbation. She received some physical therapy after that that has concluded. She continues to do her home exercise program. She is doing stretching and flexibility exercises. She does at [employer]. She is still smoking and was encouraged to quit. She is back to her regular activities. She is driving without difficulty She returns for reevaluation  Update 11/30/2016 ; she returns for follow-up after last visit to 6 months ago. She continues to do well from stroke standpoint and has not had any recurrent stroke or TIA symptoms now for since July 2016. She continues to have leg pain and spasms and chronic back pain. She has seen Karla Nelson at orthopedic spine clinic with is not happy with him. She's had MRI scans as well as Botox injections and evaluation for knee pain which has not helped. She plans on seeing a chiropractor. She has switched her primary physicians also several times and she has not been happy with them. She is tolerating aspirin well with only minor bruising but no bleeding. Blood pressure is well controlled and today it is 137789. She remains on Lipitor she is tolerating well. She had lipid profile checked a few months ago which was satisfactory. She had EMG nerve conduction study done by Karla Nelson on 09/22/16 which was normal without evidence of neuropathy or radiculopathy.  REVIEW OF SYSTEMS: Full 14 system review  of systems performed and notable only for those listed, all others are neg:   leg pain, spasms, back pain, muscle cramps, walking difficulty, neck pain, anxiety and all other systems negative  ALLERGIES: No Known Allergies  HOME MEDICATIONS: Outpatient Medications Prior to Visit  Medication Sig Dispense Refill  . amLODipine (NORVASC) 10 MG tablet Take 10 mg by mouth at bedtime.  12  . aspirin 325 MG tablet Take 1 tablet (325 mg total) by mouth daily.    Marland Kitchen atorvastatin (LIPITOR) 20 MG tablet Take 1 tablet (20 mg total) by mouth daily at 6 PM. 30 tablet 1  . hydrALAZINE (APRESOLINE) 25 MG tablet Take 25 mg by mouth 2 (two) times daily.    Marland Kitchen labetalol (NORMODYNE) 300 MG tablet Take 300 mg by mouth 2 (two) times daily.    Marland Kitchen METHADONE HCL PO Take 18 mg by mouth daily.     . Multiple Vitamin (MULTIVITAMIN WITH MINERALS) TABS tablet Take 1 tablet by mouth daily.    . Omega-3 Fatty Acids (FISH OIL PO) Take 1 capsule by mouth every morning.    . predniSONE (DELTASONE) 50 MG tablet Take 1 pill daily for 5 days. (Patient not taking: Reported  on 11/30/2016) 5 tablet 0  . triamcinolone cream (KENALOG) 0.1 % Apply 1 application topically 2 (two) times daily. (Patient not taking: Reported on 11/30/2016) 30 g 1   No facility-administered medications prior to visit.     PAST MEDICAL HISTORY: Past Medical History:  Diagnosis Date  . Asthma   . CAD (coronary artery disease)    NSTEMI with cath in August 2012 with nonobstructive disease, 50% ostial D1 and 70% distal LCX and PL disease. Normal EF. Has diastolic dysfunction with elevated EDP  . Cleft palate    special denture covers defect like a C, sleeps with device in place  . COPD (chronic obstructive pulmonary disease) (HCC)    o2 and bedtime   . DYSPNEA ON EXERTION 03/08/2007  . ELECTROCARDIOGRAM, ABNORMAL 02/13/2010  . History of oxygen administration    bedtime  . HTN (hypertension)    Has normal renal arteries.  . Hyperlipidemia   . NSTEMI  (non-ST elevated myocardial infarction) ([place]) 09-25-2010  . Stroke [facility])    july 2016, Dr Donnella Sham  . Tobacco abuse disorder     PAST SURGICAL HISTORY: Past Surgical History:  Procedure Laterality Date  . CARDIAC CATHETERIZATION  Aug 2012   Moderate nonobstructive multivessel CAD with an EF of 70 to 75%  . CESAREAN SECTION    . CORONARY ANGIOPLASTY     no further intervention last cardiac visit greater than a year    FAMILY HISTORY: Family History  Problem Relation Age of Onset  . Asthma Mother   . Heart disease Mother   . Hypertension Mother   . Asthma Brother   . Diabetes Unknown   . Hypertension Unknown     SOCIAL HISTORY: Social History   Social History  . Marital status: Single    Spouse name: N/A  . Number of children: N/A  . Years of education: N/A   Occupational History  . Not on file.   Social History Main Topics  . Smoking status: Current Every Day Smoker    Packs/day: 0.50    Years: 31.00    Types: Cigarettes    Last attempt to quit: 04/18/2015  . Smokeless tobacco: Never Used     Comment: 06/03/16 started back < 1/2 PPD  . Alcohol use No  . Drug use: No     Comment: former drug use  . Sexual activity: Yes   Other Topics Concern  . Not on file   Social History Narrative  . No narrative on file     PHYSICAL EXAM  Vitals:   11/30/16 1023  BP: 137/84  Pulse: 70  Weight: 147 lb 3.2 oz (66.8 kg)   Body mass index is 24.5 kg/m.   Generalized: frail middle aged african american lady in no acute distress  Head: normocephalic and atraumatic,.   Neck: Supple, no carotid bruits  Cardiac: Regular rate rhythm, no murmur  Musculoskeletal: No deformity   Neurological examination   Mentation: Alert oriented to time, place, history taking. Attention span and concentration appropriate. Recent and remote memory intact.  Follows all commands speech and language fluent.   Cranial nerve II-XII: Pupils were equal round reactive to light extraocular  movements were full, visual field were full on confrontational test. Facial sensation and strength were normal. hearing was intact to finger rubbing bilaterally. Uvula tongue midline. head turning and shoulder shrug were normal and symmetric.Tongue protrusion into cheek strength was normal. Motor: normal bulk and tone, full strength in the BUE, BLE, except mild weakness  in right lower extremity. Sensory: normal and symmetric to light touch, pinprick, and  Vibration, in the upper and lower extremities  Coordination: finger-nose-finger, heel-to-shin bilaterally, no dysmetria Reflexes: Brachioradialis 2/2, biceps 2/2, triceps 2/2, patellar 2/2, Achilles 2/2, plantar responses were flexor bilaterally. Gait and Station: Rising up from seated position without assistance, normal stance,  gait demonstrates mild right leg spasticity, can heel and toe walk with difficulty. Tandem gait mildly unsteady no assistive device  DIAGNOSTIC DATA (LABS, IMAGING, TESTING) - I reviewed patient records, labs, notes, testing and imaging myself where available.  Lab Results  Component Value Date   WBC 8.4 02/14/2015   HGB 13.9 02/14/2015   HCT 41.0 02/14/2015   MCV 90.0 02/14/2015   PLT 237 02/14/2015      Component Value Date/Time   NA 139 02/14/2015 1933   K 3.8 02/14/2015 1933   CL 99 (L) 02/14/2015 1933   CO2 26 02/14/2015 1906   GLUCOSE 149 (H) 02/14/2015 1933   BUN 38 (H) 02/14/2015 1933   CREATININE 1.80 (H) 02/14/2015 1933   CALCIUM 9.9 02/14/2015 1906   PROT 7.0 02/14/2015 1906   ALBUMIN 4.1 02/14/2015 1906   AST 22 02/14/2015 1906   ALT 16 02/14/2015 1906   ALKPHOS 70 02/14/2015 1906   BILITOT 0.4 02/14/2015 1906   GFRNONAA 29 (L) 02/14/2015 1906   GFRAA 34 (L) 02/14/2015 1906   Lab Results  Component Value Date   CHOL 171 09/01/2014   HDL 33 (L) 09/01/2014   LDLCALC 113 (H) 09/01/2014   TRIG 127 09/01/2014   CHOLHDL 5.2 09/01/2014   Lab Results  Component Value Date   HGBA1C 6.9 (H)  09/01/2014      ASSESSMENT AND PLAN 51 year patient with left internal capsule infarct in July 2016 secondary to small vessel disease with risk factors of smoking, hypertension and hyperlipidemia.. Right leg spasms and mild difficulty walking due to post stroke spasticity which has improved. Chronic low back pain and bilateral leg pain been managed at spine clinic   PLAN: I had a long discussion with the patient with regards to her remote stroke as well as chronic leg and back pain and answered questions. Continue aspirin for stroke prevention with strict control of hypertension with blood pressure goal below 130/90 and lipids with LDL cholesterol goal below 70 mg percent. Continue follow-up for her chronic back and neck pain with Karla Nelson in the spine clinic. No routine scheduled follow-up appointment with me is necessary but she may be referred back in the future as neededI spent 25 min  in total face to face time with the patient more than 50% of which was spent counseling and coordination of care, reviewing test results reviewing medications and discussing and reviewing the diagnosis of stroke and prevention of further stroke.   Antony Contras, MD [facility] [address], [place], [place] [address] [phone]

## 2016-12-07 ENCOUNTER — Encounter (HOSPITAL_COMMUNITY): Payer: Self-pay | Admitting: Emergency Medicine

## 2016-12-07 ENCOUNTER — Ambulatory Visit (HOSPITAL_COMMUNITY)
Admission: EM | Admit: 2016-12-07 | Discharge: 2016-12-07 | Disposition: A | Discharge status: Home / Self Care | Payer: 59 | Attending: Family Medicine | Admitting: Family Medicine

## 2016-12-07 DIAGNOSIS — B9789 Other viral agents as the cause of diseases classified elsewhere: Secondary | ICD-10-CM

## 2016-12-07 DIAGNOSIS — J069 Acute upper respiratory infection, unspecified: Secondary | ICD-10-CM

## 2016-12-07 MED ORDER — DOXYCYCLINE HYCLATE 100 MG PO CAPS: 100.0000 mg | ORAL_CAPSULE | Freq: Two times a day (BID) | ORAL | 0 refills | Status: DC

## 2016-12-07 MED ORDER — PREDNISONE 10 MG PO TABS: 40.0000 mg | ORAL_TABLET | Freq: Every day | ORAL | 0 refills | Status: AC

## 2016-12-07 NOTE — ED Triage Notes (Signed)
Pt c/o cold symptoms, and rash on the left side of face.

## 2016-12-07 NOTE — ED Provider Notes (Signed)
[place]    CSN: [id number] Arrival date & time: 12/07/16  6283     History   Chief Complaint Chief Complaint  Patient presents with  . Nasal Congestion    HPI Karla Nelson is a 51 y.o. female.   51 year-old female, with history of HTN, NSTEMI, CAD, hyperlipidemia, COPD, presenting today complaining of cold symptoms. Started approximately 3 days ago. Nonproductive cough, nasal congestion, chills. She has been taking EmergC for her symptoms. She denies fever, chest pain, shortness of breath    The history is provided by the patient.  URI  Presenting symptoms: congestion and cough   Presenting symptoms: no ear pain, no facial pain, no fatigue, no fever, no rhinorrhea and no sore throat   Severity:  Moderate Onset quality:  Gradual Duration:  3 days Timing:  Constant Progression:  Unchanged Chronicity:  New Relieved by:  Nothing Worsened by:  Nothing Ineffective treatments:  None tried Associated symptoms: no arthralgias, no headaches, no myalgias, no neck pain, no sinus pain, no sneezing, no swollen glands and no wheezing   Risk factors: chronic respiratory disease and sick contacts   Risk factors: not elderly, no chronic cardiac disease, no chronic kidney disease, no diabetes mellitus, no immunosuppression, no recent illness and no recent travel     Past Medical History:  Diagnosis Date  . Asthma   . CAD (coronary artery disease)    NSTEMI with cath in August 2012 with nonobstructive disease, 50% ostial D1 and 70% distal LCX and PL disease. Normal EF. Has diastolic dysfunction with elevated EDP  . Cleft palate    special denture covers defect like a C, sleeps with device in place  . COPD (chronic obstructive pulmonary disease) (HCC)    o2 and bedtime   . DYSPNEA ON EXERTION 03/08/2007  . ELECTROCARDIOGRAM, ABNORMAL 02/13/2010  . History of oxygen administration    bedtime  . HTN (hypertension)    Has normal renal arteries.  . Hyperlipidemia   .  NSTEMI (non-ST elevated myocardial infarction) ([place]) 09-25-2010  . Stroke [facility])    july 2016, Dr Donnella Sham  . Tobacco abuse disorder     Patient Active Problem List   Diagnosis Date Noted  . History of stroke 06/03/2016  . Spastic hemiplegia affecting nondominant side ([place]) 03/26/2015  . Smoking 12/30/2014  . Chronic obstructive pulmonary disease ([place]) 12/30/2014  . Lacunar infarct, acute 12/04/2014  . Acute ischemic stroke ([place]) 09/01/2014  . Stroke ([place]) 09/01/2014  . Right sided weakness   . Smoker 03/15/2014  . History of cocaine use 03/15/2014  . Asthma, moderate persistent 07/26/2013  . 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 Acute sinusitis 04/11/2013  . NSTEMI (non-ST elevated myocardial infarction) ([place]) 06/19/2012  . Acute-on-chronic kidney injury ([place]) 06/19/2012  . Community acquired pneumonia 06/16/2012  . COPD exacerbation ([place]) 12/05/2011  . COPD (chronic obstructive pulmonary disease) ([place]) 07/12/2011  . CAD (coronary artery disease) 01/13/2011  . Tobacco abuse 01/13/2011  . Mixed hyperlipidemia 10/13/2010  . ELECTROCARDIOGRAM, ABNORMAL 02/13/2010  . CLEFT PALATE 02/12/2010  . Essential hypertension 03/08/2007  . DYSPNEA ON EXERTION 03/08/2007    Past Surgical History:  Procedure Laterality Date  . CARDIAC CATHETERIZATION  Aug 2012   Moderate nonobstructive multivessel CAD with an EF of 70 to 75%  . CESAREAN SECTION    . CORONARY ANGIOPLASTY     no further intervention last cardiac visit greater than a year    OB History    Gravida Para Term Preterm AB  Living             1   SAB TAB Ectopic Multiple Live Births                   Home Medications    Prior to Admission medications   Medication Sig Start Date End Date Taking? Authorizing Provider  amLODipine (NORVASC) 10 MG tablet Take 10 mg by mouth at bedtime. 08/09/14  Yes [provider]  aspirin 325 MG tablet Take 1 tablet (325 mg total) by mouth daily. 09/02/14  Yes Delfina Redwood, MD   atorvastatin (LIPITOR) 20 MG tablet Take 1 tablet (20 mg total) by mouth daily at 6 PM. 09/02/14  Yes Delfina Redwood, MD  Cholecalciferol (VITAMIN D PO) Take 1,000 Units by mouth.   Yes [provider]  COD LIVER OIL PO Take by mouth.   Yes [provider]  Cyanocobalamin (B-12) 1000 MCG TABS Take by mouth.   Yes [provider]  hydrALAZINE (APRESOLINE) 25 MG tablet Take 25 mg by mouth 2 (two) times daily.   Yes [provider]  labetalol (NORMODYNE) 300 MG tablet Take 300 mg by mouth 2 (two) times daily.   Yes [provider]  Multiple Vitamin (MULTIVITAMIN WITH MINERALS) TABS tablet Take 1 tablet by mouth daily.   Yes [provider]  Omega-3 Fatty Acids (FISH OIL PO) Take 1 capsule by mouth every morning.   Yes [provider]  doxycycline (VIBRAMYCIN) 100 MG capsule Take 1 capsule (100 mg total) by mouth 2 (two) times daily. Start taking 11/1 if not feeling better 12/09/16   Saloma Cadena C, PA-C  METHADONE HCL PO Take 18 mg by mouth daily.     [provider]  predniSONE (DELTASONE) 10 MG tablet Take 4 tablets (40 mg total) by mouth daily. 12/07/16 12/12/16  Aldrick Derrig C, PA-C  UNABLE TO FIND Alpha lipid acid take twice a day    [provider]    Family History Family History  Problem Relation Age of Onset  . Asthma Mother   . Heart disease Mother   . Hypertension Mother   . Asthma Brother   . Diabetes Unknown   . Hypertension Unknown     Social History Social History  Substance Use Topics  . Smoking status: Current Every Day Smoker    Packs/day: 0.50    Years: 31.00    Types: Cigarettes    Last attempt to quit: 04/18/2015  . Smokeless tobacco: Never Used     Comment: 06/03/16 started back < 1/2 PPD  . Alcohol use No     Allergies   Patient has no known allergies.   Review of Systems Review of Systems  Constitutional: Negative for chills, fatigue and fever.  HENT: Positive for  congestion. Negative for dental problem, drooling, ear pain, facial swelling, mouth sores, rhinorrhea, sinus pain, sneezing and sore throat.   Eyes: Negative for pain and visual disturbance.  Respiratory: Positive for cough. Negative for shortness of breath and wheezing.   Cardiovascular: Negative for chest pain and palpitations.  Gastrointestinal: Negative for abdominal pain and vomiting.  Genitourinary: Negative for dysuria and hematuria.  Musculoskeletal: Negative for arthralgias, back pain, myalgias and neck pain.  Skin: Negative for color change and rash.  Neurological: Negative for seizures, syncope and headaches.  All other systems reviewed and are negative.    Physical Exam Triage Vital Signs ED Triage Vitals  Enc Vitals Group  BP 12/07/16 1025 (!) 153/89     Pulse Rate 12/07/16 1025 77     Resp 12/07/16 1025 16     Temp 12/07/16 1025 98.5 F (36.9 C)     Temp Source 12/07/16 1025 Oral     SpO2 12/07/16 1025 94 %     Weight 12/07/16 1025 147 lb (66.7 kg)     Height 12/07/16 1025 5\' 5"  (1.651 m)     Head Circumference --      Peak Flow --      Pain Score 12/07/16 1031 5     Pain Loc --      Pain Edu? --      Excl. in [place]? --    No data found.   Updated Vital Signs BP (!) 153/89 (BP Location: Right Arm)   Pulse 77   Temp 98.5 F (36.9 C) (Oral)   Resp 16   Ht 5\' 5"  (1.651 m)   Wt 147 lb (66.7 kg)   LMP 09/09/2014 (Approximate) Comment: none since august  SpO2 94%   BMI 24.46 kg/m   Visual Acuity Right Eye Distance:   Left Eye Distance:   Bilateral Distance:    Right Eye Near:   Left Eye Near:    Bilateral Near:     Physical Exam  Constitutional: She is oriented to person, place, and time. She appears well-developed and well-nourished. No distress.  HENT:  Head: Normocephalic.  Right Ear: Hearing, tympanic membrane, external ear and ear canal normal.  Left Ear: Hearing, tympanic membrane, external ear and ear canal normal.  Nose: Nose normal.    Mouth/Throat: Uvula is midline and oropharynx is clear and moist. No oropharyngeal exudate, posterior oropharyngeal edema, posterior oropharyngeal erythema or tonsillar abscesses.  Eyes: Pupils are equal, round, and reactive to light. EOM are normal.  Neck: Normal range of motion.  Cardiovascular: Normal rate.   Pulmonary/Chest: Effort normal and breath sounds normal. She has no decreased breath sounds. She has no wheezes. She has no rhonchi. She has no rales.  Abdominal: Soft.  Musculoskeletal: Normal range of motion.  Neurological: She is alert and oriented to person, place, and time.  Skin: Skin is warm. She is not diaphoretic.  Psychiatric: She has a normal mood and affect.  Nursing note and vitals reviewed.    UC Treatments / Results  Labs (all labs ordered are listed, but only abnormal results are displayed) Labs Reviewed - No data to display  EKG  EKG Interpretation None       Radiology No results found.  Procedures Procedures (including critical care time)  Medications Ordered in UC Medications - No data to display   Initial Impression / Assessment and Plan / UC Course  I have reviewed the triage vital signs and the nursing notes.  Pertinent labs & imaging results that were available during my care of the patient were reviewed by me and considered in my medical decision making (see chart for details).     Cold symptoms. No evidence of COPD exacerbation. She is requesting antibiotics. Recommended she try the prednisone for the next 2 days prior to starting the antibiotics as this is likely viral  Final Clinical Impressions(s) / UC Diagnoses   Final diagnoses:  Viral URI with cough    New Prescriptions New Prescriptions   DOXYCYCLINE (VIBRAMYCIN) 100 MG CAPSULE    Take 1 capsule (100 mg total) by mouth 2 (two) times daily. Start taking 11/1 if not feeling better   PREDNISONE (DELTASONE)  10 MG TABLET    Take 4 tablets (40 mg total) by mouth daily.      Controlled Substance Prescriptions [place] Controlled Substance Registry consulted? Not Applicable   Phebe Colla, [place] 12/07/16 1044

## 2016-12-17 IMAGING — US US RENAL
1 series · 14 of 25 positions shown · non-contrast
Comparison: None.

CLINICAL DATA: Chronic renal disease.

EXAM:
RENAL / URINARY TRACT ULTRASOUND COMPLETE

[Series 1: us renal · 0.22mm/px · 14 of 27 slices shown]
[im 1/27]
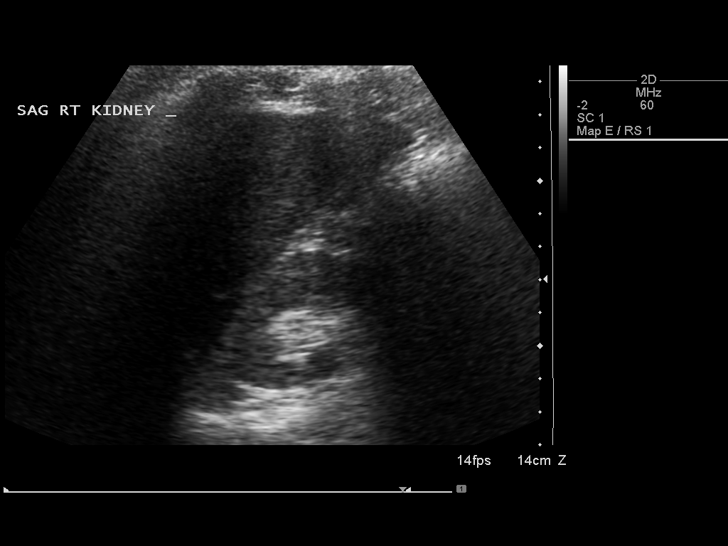
[im 3/27]
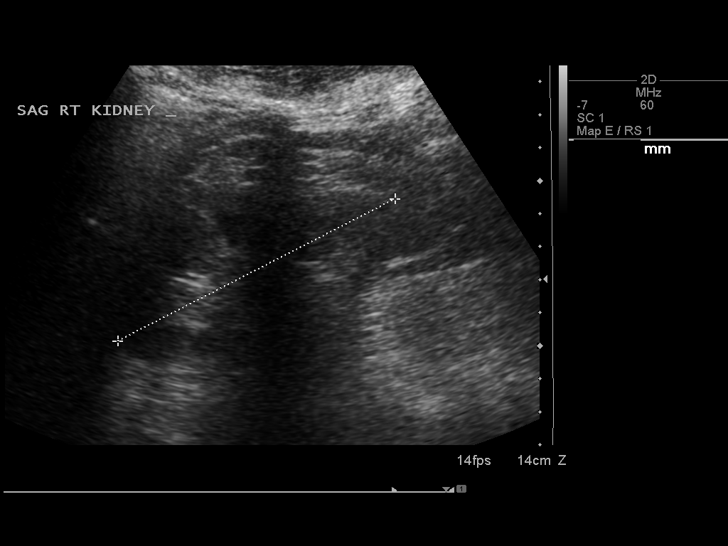
[im 5/27]
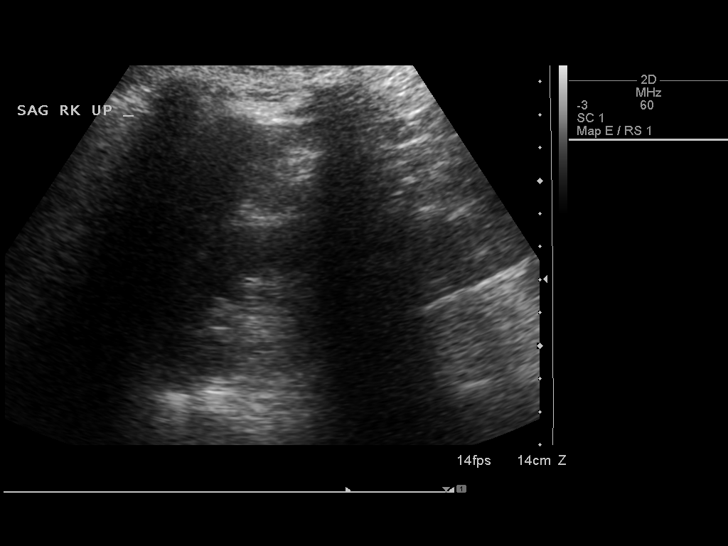
[im 7/27]
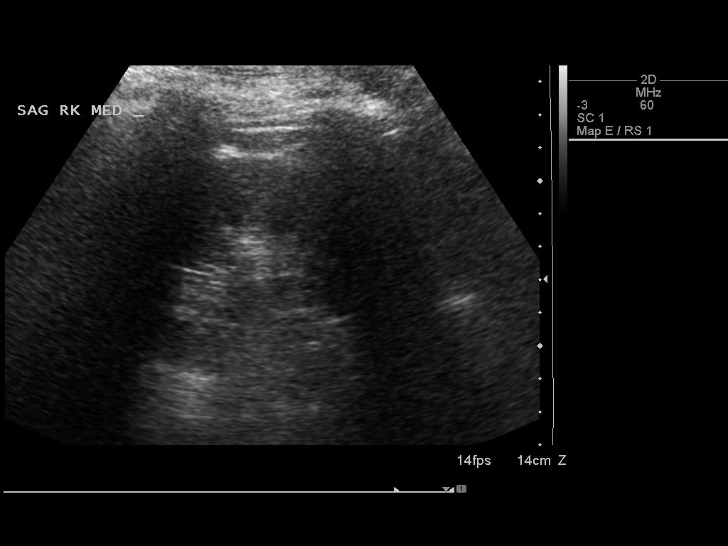
[im 9/27]
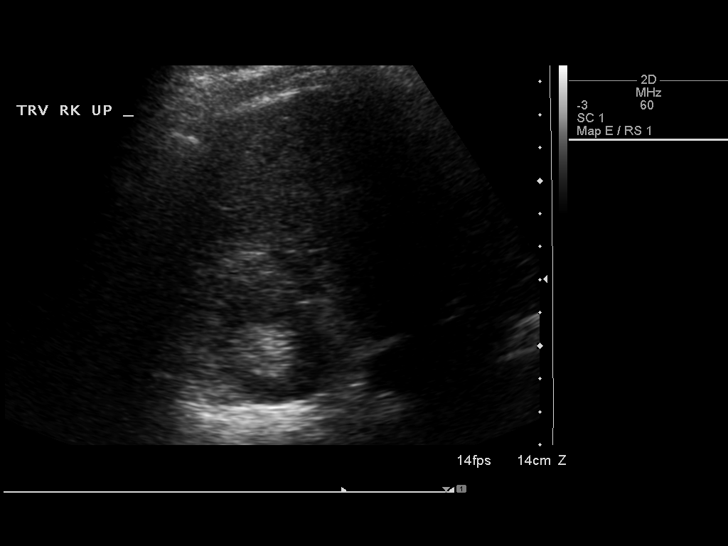
[im 10/27]
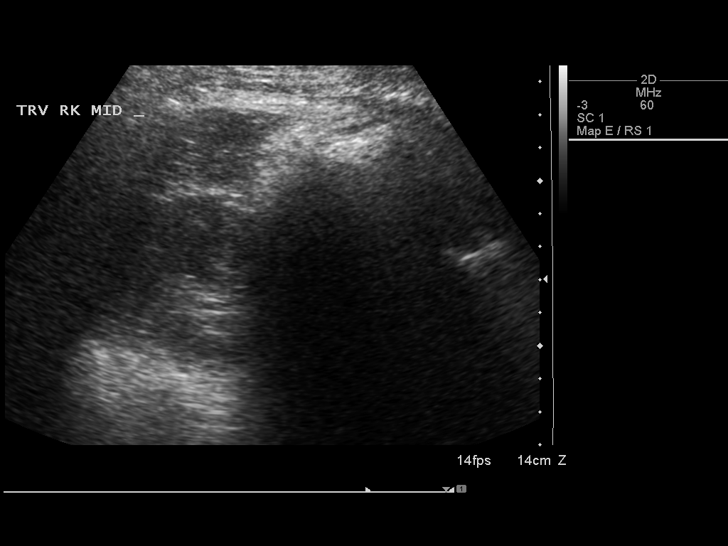
[im 12/27]
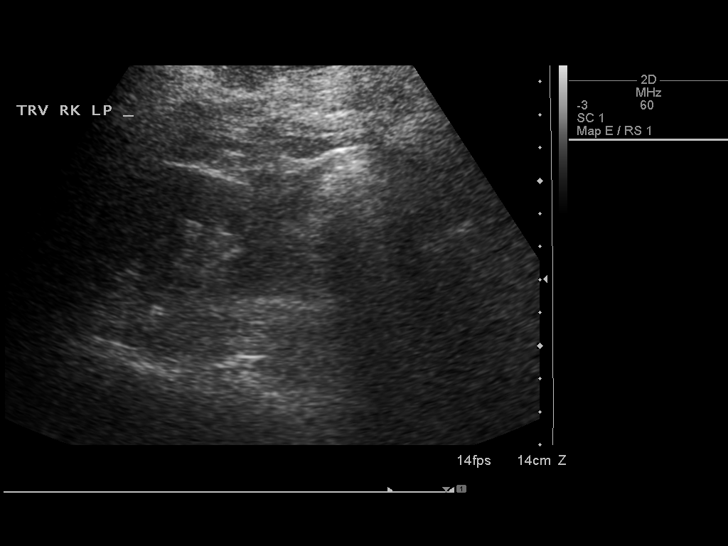
[im 15/27]
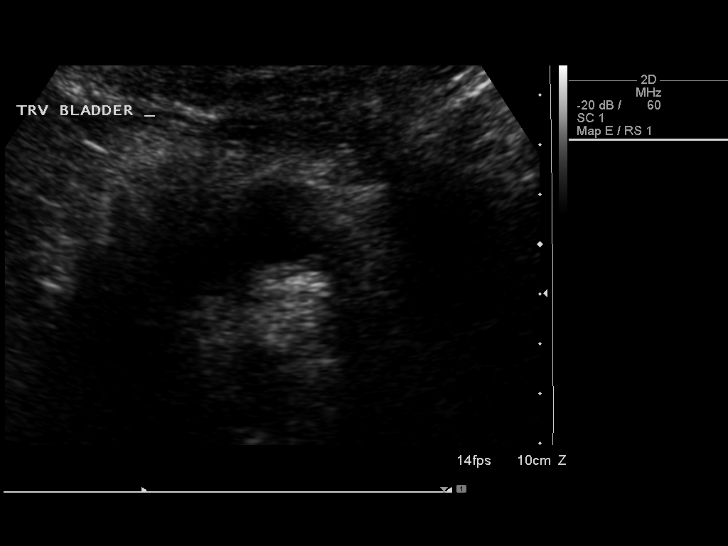
[im 17/27]
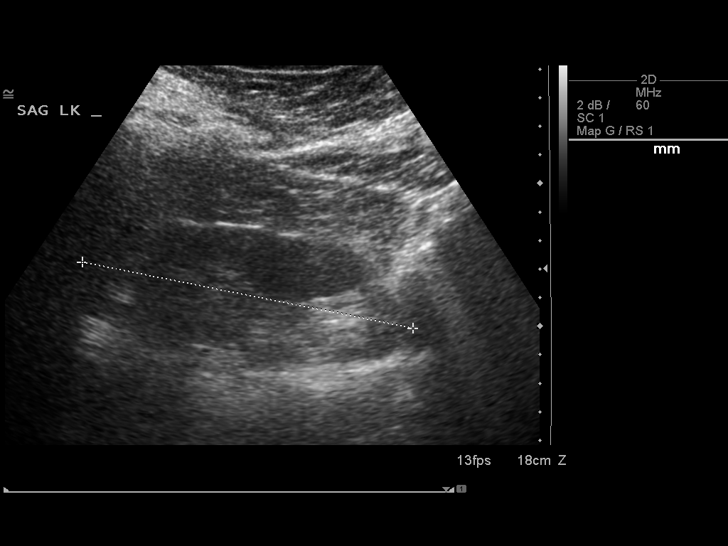
[im 18/27]
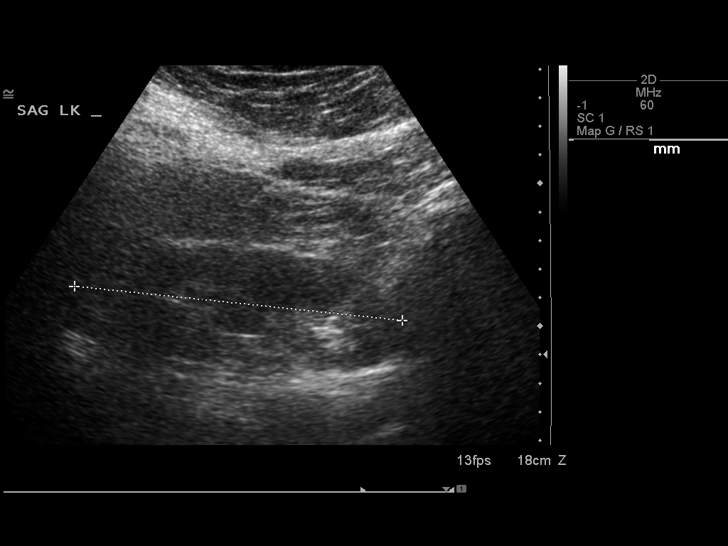
[im 20/27]
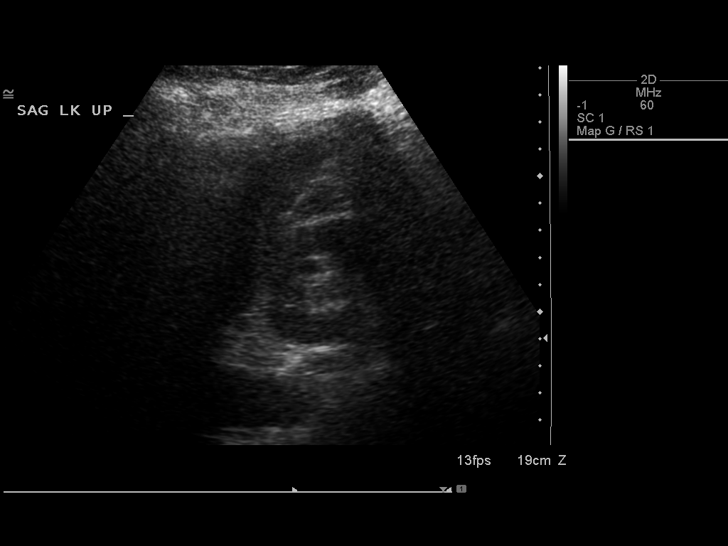
[im 22/27]
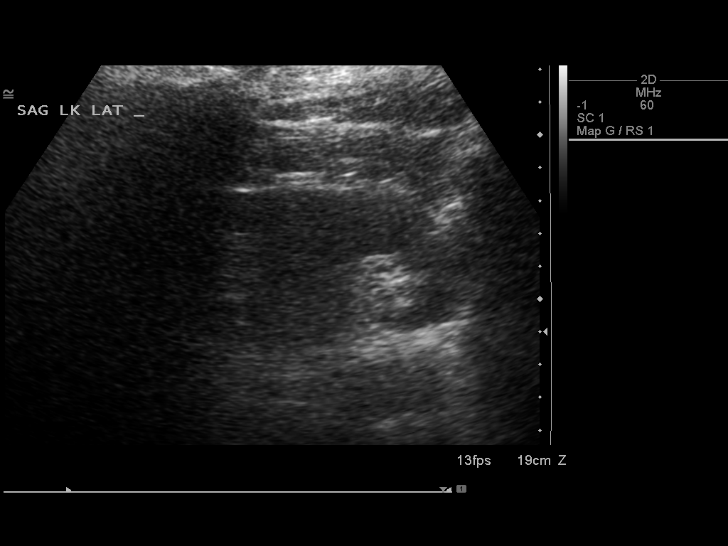
[im 24/27]
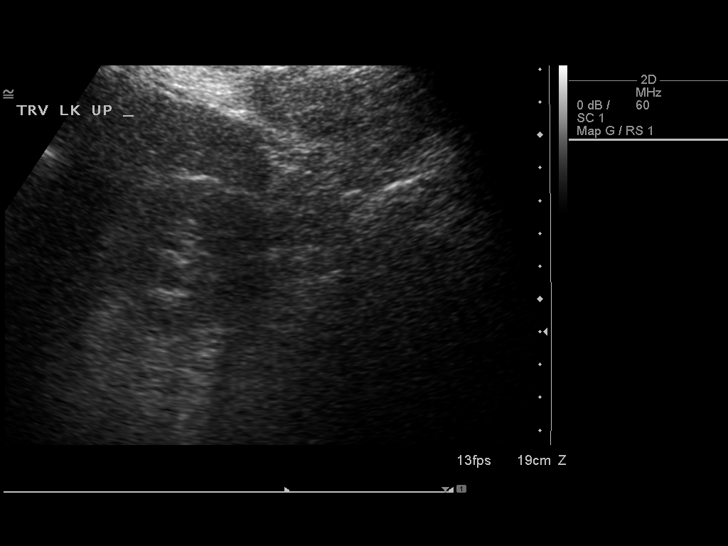
[im 27/27]
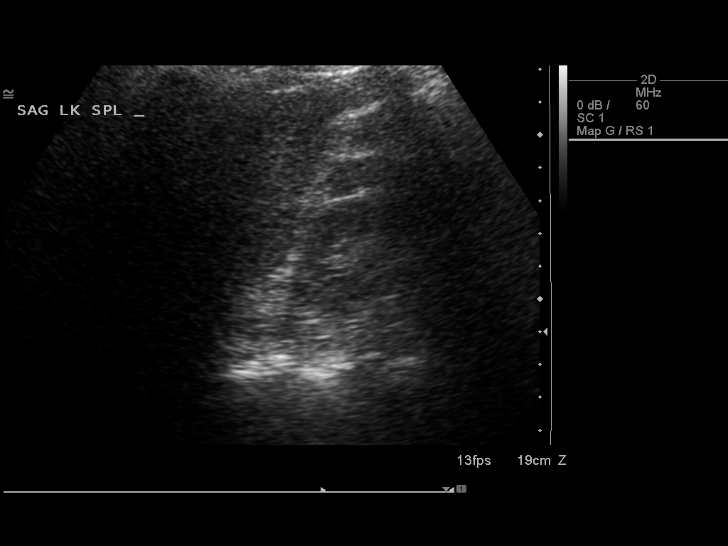

[14 of 25 positions shown; findings below may reference images not displayed]

FINDINGS: Right Kidney:

Length: 9.4 cm. Cortical thinning. Echogenicity within normal
limits. No mass or hydronephrosis visualized.

Left Kidney:

Length: 11.8 cm. Echogenicity within normal limits. No mass or
hydronephrosis visualized.

Bladder:

Appears normal for degree of bladder distention.
IMPRESSION: 1. Mild right renal atrophy.

2.  No acute abnormality.  No hydronephrosis or bladder distention.

## 2017-01-01 IMAGING — MR MR HEAD W/O CM
8 of 10 series · 35 of 48 positions shown · non-contrast
Comparison: CT head from the same day.  MRI brain 09/01/2014.

CLINICAL DATA: Code stroke. Progressive right hemi paresis and
dysarthria.

EXAM:
MRI HEAD WITHOUT CONTRAST
TECHNIQUE: Multiplanar, multiecho pulse sequences of the brain and surrounding
structures were obtained without intravenous contrast.

[Series 3: DWI · axial · 3.0mm · 1.09mm/px · z∈[-117,+9]mm · 9 of 90 slices shown (1 of 4)]
[im 1/90]
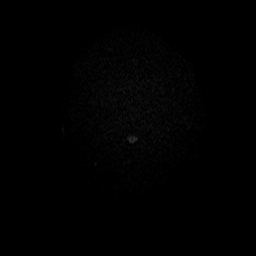
[im 12/90]
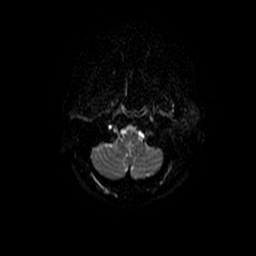
[im 23/90]
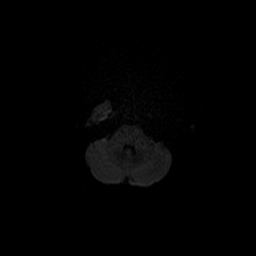
[im 34/90]
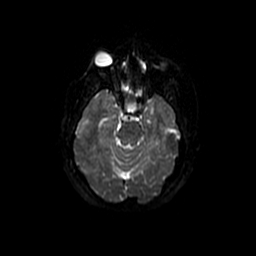
[im 45/90]
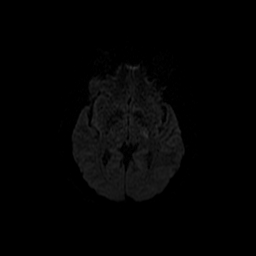
[im 56/90]
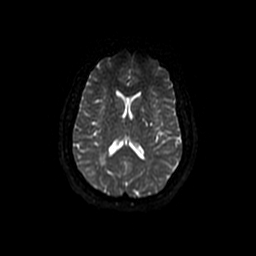
[im 67/90]
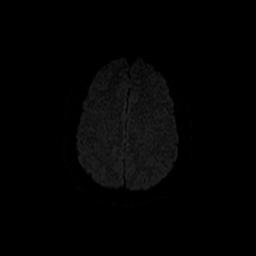
[im 78/90]
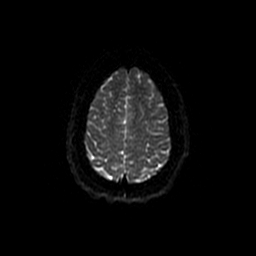
[im 90/90]
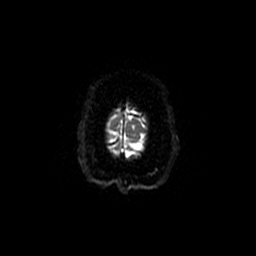

[Series 4: T1 · sagittal · 5.0mm · 0.47mm/px · 2 of 21 slices shown]
[im 1/21]
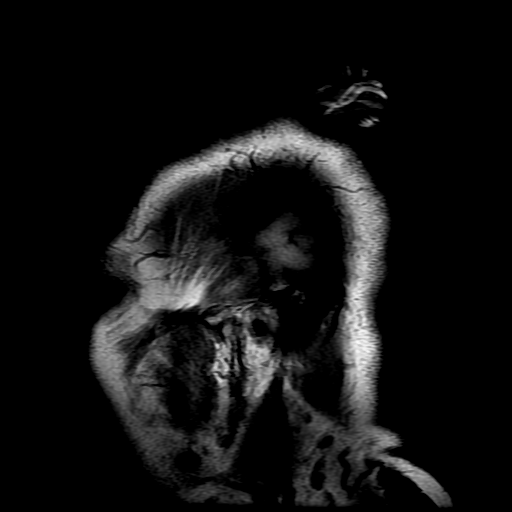
[im 21/21]
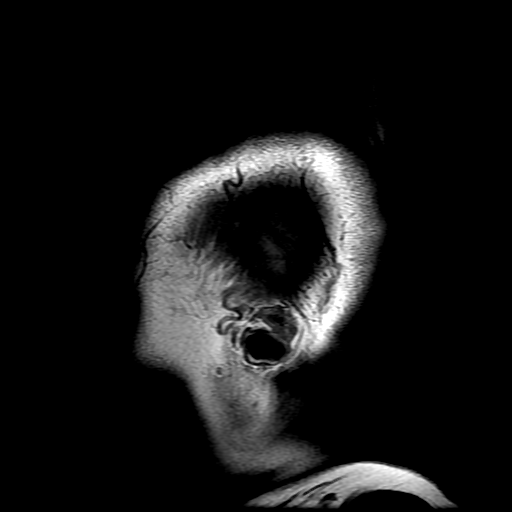

[Series 5: T2 · axial · 5.0mm · 0.43mm/px · z∈[-112,+14]mm · 3 of 23 slices shown (1 of 2)]
[im 1/23]
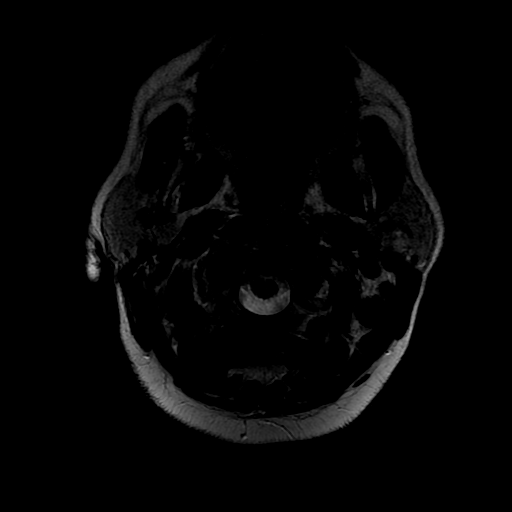
[im 12/23]
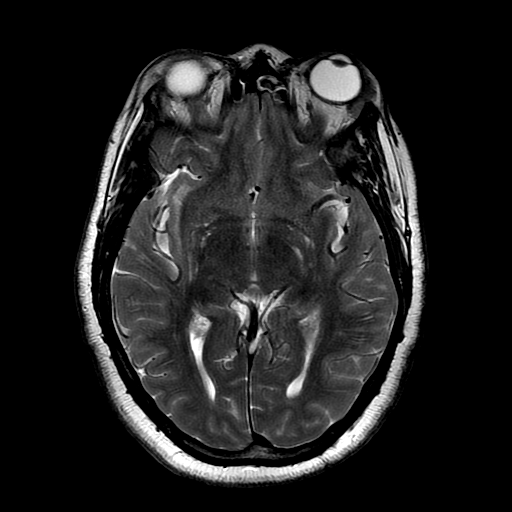
[im 23/23]
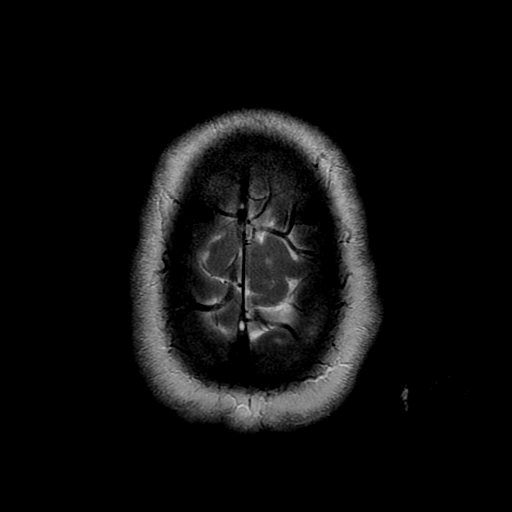

[Series 6: DWI · coronal · 5.0mm · 1.09mm/px · 7 of 62 slices shown (2 of 4)]
[im 1/62]
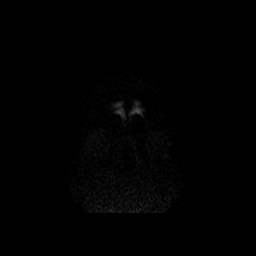
[im 11/62]
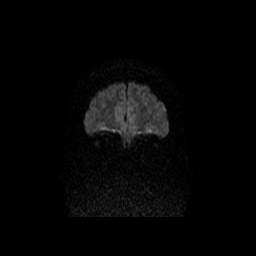
[im 21/62]
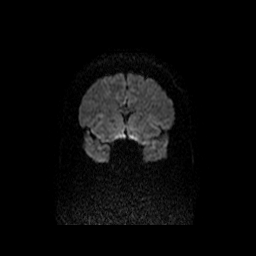
[im 31/62]
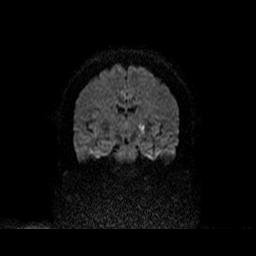
[im 41/62]
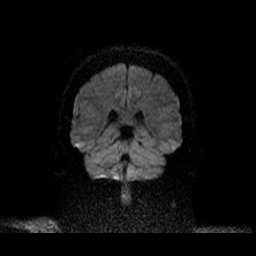
[im 51/62]
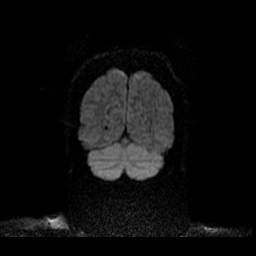
[im 62/62]
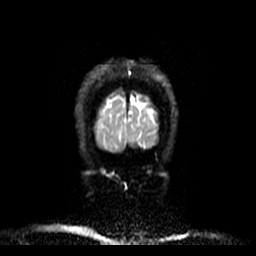

[Series 7: FLAIR · axial · 5.0mm · 0.43mm/px · z∈[-112,+14]mm · 3 of 23 slices shown]
[im 1/23]
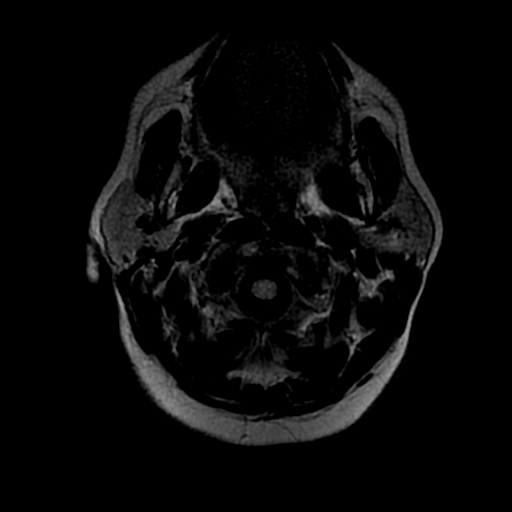
[im 12/23]
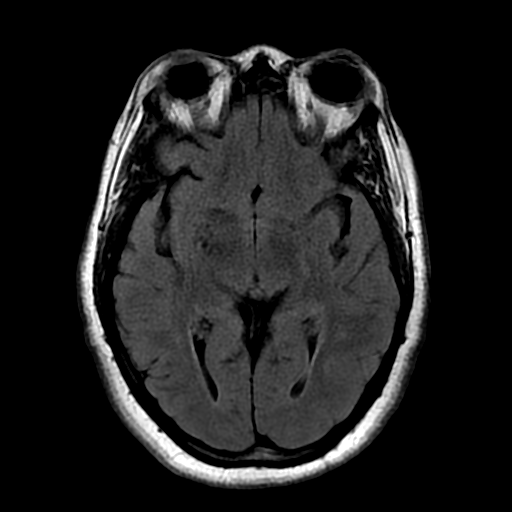
[im 23/23]
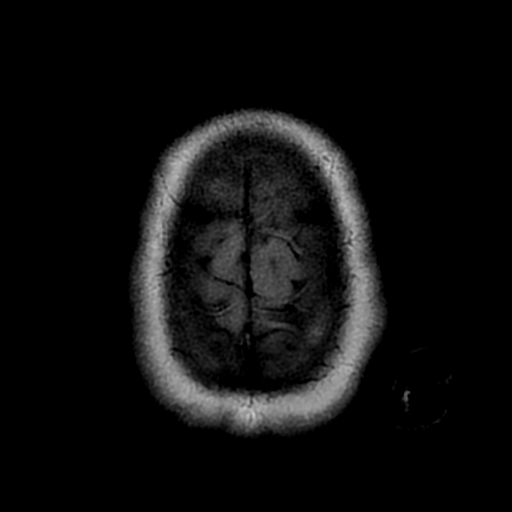

[Series 10: T2 · coronal · 5.0mm · 0.39mm/px · 3 of 24 slices shown (2 of 2)]
[im 1/24]
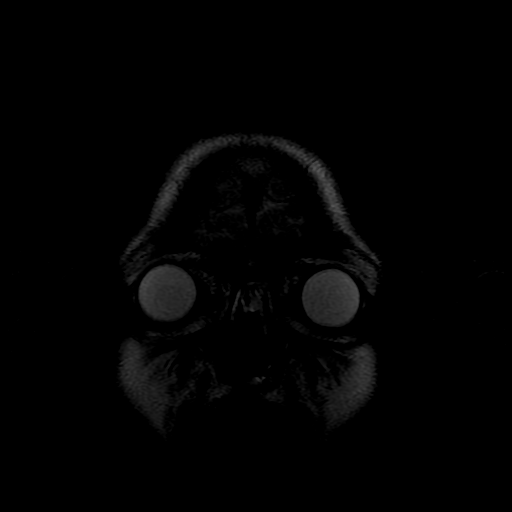
[im 12/24]
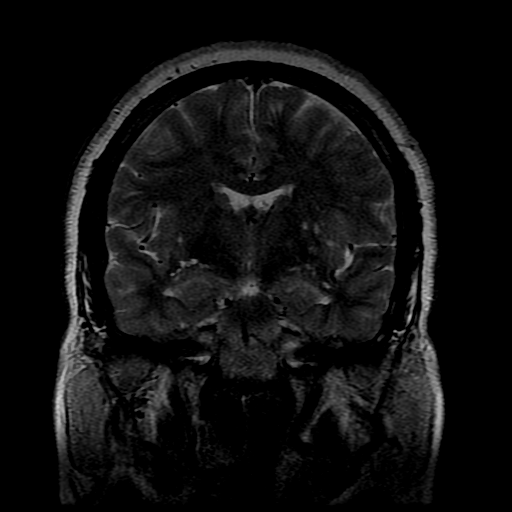
[im 24/24]
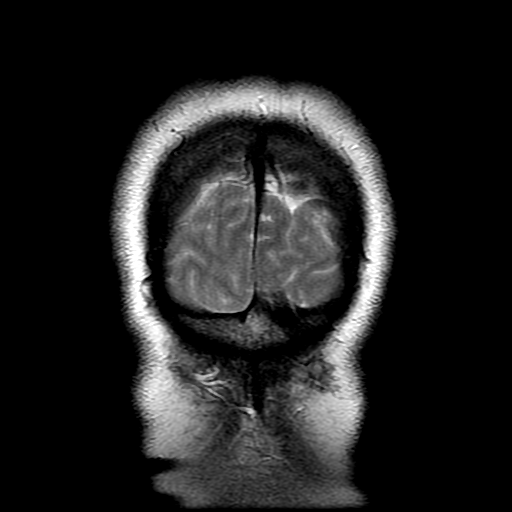

[Series 300: DWI · axial · 3.0mm · 1.09mm/px · z∈[-117,+9]mm · 5 of 45 slices shown (3 of 4)]
[im 1/45]
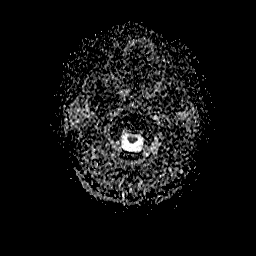
[im 12/45]
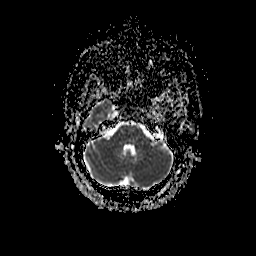
[im 23/45]
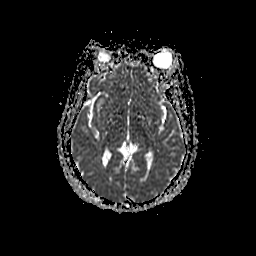
[im 34/45]
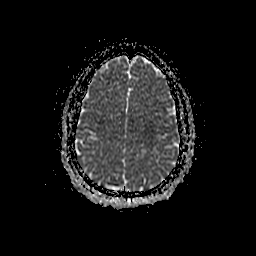
[im 45/45]
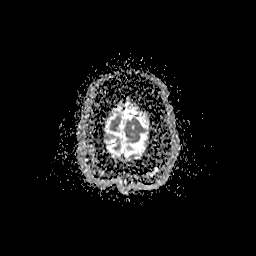

[Series 600: DWI · coronal · 5.0mm · 1.09mm/px · 3 of 31 slices shown (4 of 4)]
[im 1/31]
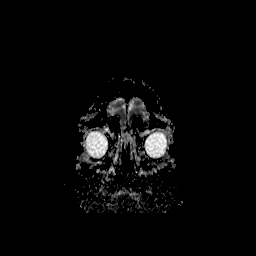
[im 16/31]
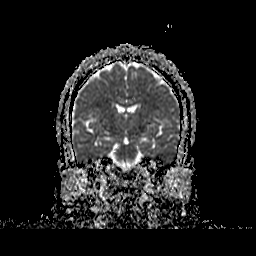
[im 31/31]
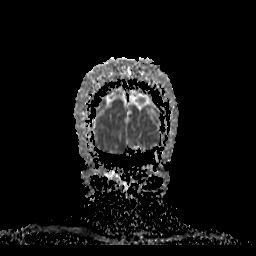

[35 of 48 positions shown; findings below may reference images not displayed]

FINDINGS: An acute nonhemorrhagic infarct within the posterior limb of the
left internal capsule is not significantly changed in size,
measuring 12 mm maximally. No new infarct is present. There is no
acute hemorrhage or mass lesion. T2 changes associated with the
acute infarct are more conspicuous on today's exam. Periventricular
and subcortical T2 changes are otherwise stable.

Flow is present in the major intracranial arteries. The globes and
orbits are intact.

Mild mucosal thickening is present in the inferior frontal sinuses
bilaterally, the residual posterior ethmoid air cells, and bilateral
maxillary sinuses. There is mild mucosal thickening in the sphenoid
sinuses bilaterally. The turbinates are absent.

The globes and orbits are intact.
IMPRESSION: 1. Stable appearance of posterior limb left internal capsule
acute/subacute nonhemorrhagic infarct measuring up to 12 mm in
maximal dimension.
2. Periventricular and subcortical T2 changes are otherwise stable.
3. Mild to moderate chronic sinus disease is similar to the prior
study.
4. The nasal turbinates are absent.  This may be postsurgical.

## 2017-04-07 ENCOUNTER — Encounter (HOSPITAL_COMMUNITY): Payer: Self-pay | Admitting: Family Medicine

## 2017-04-07 ENCOUNTER — Ambulatory Visit (HOSPITAL_COMMUNITY)
Admission: EM | Admit: 2017-04-07 | Discharge: 2017-04-07 | Disposition: A | Discharge status: Home / Self Care | Payer: 59 | Attending: Emergency Medicine | Admitting: Emergency Medicine

## 2017-04-07 DIAGNOSIS — J441 Chronic obstructive pulmonary disease with (acute) exacerbation: Secondary | ICD-10-CM | POA: Diagnosis not present

## 2017-04-07 DIAGNOSIS — B9789 Other viral agents as the cause of diseases classified elsewhere: Secondary | ICD-10-CM | POA: Diagnosis not present

## 2017-04-07 DIAGNOSIS — J069 Acute upper respiratory infection, unspecified: Secondary | ICD-10-CM

## 2017-04-07 MED ORDER — PREDNISONE 50 MG PO TABS: 50.0000 mg | ORAL_TABLET | Freq: Every day | ORAL | 0 refills | Status: AC

## 2017-04-07 MED ORDER — ALBUTEROL SULFATE HFA 108 (90 BASE) MCG/ACT IN AERS: 1.0000 | INHALATION_SPRAY | Freq: Four times a day (QID) | RESPIRATORY_TRACT | 0 refills | Status: DC | PRN

## 2017-04-07 MED ORDER — BENZONATATE 200 MG PO CAPS: 200.0000 mg | ORAL_CAPSULE | Freq: Three times a day (TID) | ORAL | 0 refills | Status: AC | PRN

## 2017-04-07 MED ORDER — DOXYCYCLINE HYCLATE 100 MG PO CAPS: 100.0000 mg | ORAL_CAPSULE | Freq: Two times a day (BID) | ORAL | 0 refills | Status: AC

## 2017-04-07 NOTE — ED Triage Notes (Signed)
Pt here for cough, congestion x 3 days.

## 2017-04-07 NOTE — Discharge Instructions (Signed)
Please begin doxycycline twice daily for the next 10 days.  Take prednisone 50 mg for the next 5 days  You may use the over-the-counter cough syrup or Tessalon as needed for cough.  For congestion please begin a daily allergy pill like Zyrtec or Claritin  Please return here or go to emergency room if you develop worsening shortness of breath difficulty breathing.

## 2017-04-07 NOTE — ED Provider Notes (Signed)
[place]    CSN: [id number] Arrival date & time: 04/07/17  1038     History   Chief Complaint Chief Complaint  Patient presents with  . Cough  . Nasal Congestion    HPI Karla Nelson is a 52 y.o. female history of hypertension, hyperlipidemia, COPD presenting today with cough, congestion.  Her symptoms have been going on for approximately 3 days.  She is also feeling achy and feels like her eyes are slightly swelling and irritated.  She states that she normally improves with prednisone and doxycycline.  She has had increased sputum production.  Has oxygen at home that she uses as needed as well as inhalers.  Denies fevers, chest pain.  Denies nausea, vomiting, abdominal pain.  Patient tolerating oral intake well, maintaining appetite.  She has tried over-the-counter cough syrups, vitamin C and Airborne.  HPI  Past Medical History:  Diagnosis Date  . Asthma   . CAD (coronary artery disease)    NSTEMI with cath in August 2012 with nonobstructive disease, 50% ostial D1 and 70% distal LCX and PL disease. Normal EF. Has diastolic dysfunction with elevated EDP  . Cleft palate    special denture covers defect like a C, sleeps with device in place  . COPD (chronic obstructive pulmonary disease) (HCC)    o2 and bedtime   . DYSPNEA ON EXERTION 03/08/2007  . ELECTROCARDIOGRAM, ABNORMAL 02/13/2010  . History of oxygen administration    bedtime  . HTN (hypertension)    Has normal renal arteries.  . Hyperlipidemia   . NSTEMI (non-ST elevated myocardial infarction) ([place]) 09-25-2010  . Stroke [facility])    july 2016, Dr Donnella Sham  . Tobacco abuse disorder     Patient Active Problem List   Diagnosis Date Noted  . History of stroke 06/03/2016  . Spastic hemiplegia affecting nondominant side ([place]) 03/26/2015  . Smoking 12/30/2014  . Chronic obstructive pulmonary disease ([place]) 12/30/2014  . Lacunar infarct, acute 12/04/2014  . Acute ischemic stroke ([place]) 09/01/2014  . Stroke  ([place]) 09/01/2014  . Right sided weakness   . Smoker 03/15/2014  . History of cocaine use 03/15/2014  . Asthma, moderate persistent 07/26/2013  . 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 Acute sinusitis 04/11/2013  . NSTEMI (non-ST elevated myocardial infarction) ([place]) 06/19/2012  . Acute-on-chronic kidney injury ([place]) 06/19/2012  . Community acquired pneumonia 06/16/2012  . COPD exacerbation ([place]) 12/05/2011  . COPD (chronic obstructive pulmonary disease) ([place]) 07/12/2011  . CAD (coronary artery disease) 01/13/2011  . Tobacco abuse 01/13/2011  . Mixed hyperlipidemia 10/13/2010  . ELECTROCARDIOGRAM, ABNORMAL 02/13/2010  . CLEFT PALATE 02/12/2010  . Essential hypertension 03/08/2007  . DYSPNEA ON EXERTION 03/08/2007    Past Surgical History:  Procedure Laterality Date  . CARDIAC CATHETERIZATION  Aug 2012   Moderate nonobstructive multivessel CAD with an EF of 70 to 75%  . CESAREAN SECTION    . CORONARY ANGIOPLASTY     no further intervention last cardiac visit greater than a year    OB History    Gravida Para Term Preterm AB Living             1   SAB TAB Ectopic Multiple Live Births                   Home Medications    Prior to Admission medications   Medication Sig Start Date End Date Taking? Authorizing Provider  amLODipine (NORVASC) 10 MG tablet Take 10 mg by mouth at bedtime. 08/09/14  [provider]  aspirin 325 MG tablet Take 1 tablet (325 mg total) by mouth daily. 09/02/14   Delfina Redwood, MD  atorvastatin (LIPITOR) 20 MG tablet Take 1 tablet (20 mg total) by mouth daily at 6 PM. 09/02/14   Delfina Redwood, MD  benzonatate (TESSALON) 200 MG capsule Take 1 capsule (200 mg total) by mouth 3 (three) times daily as needed for up to 7 days for cough. 04/07/17 04/14/17  Clariece Roesler C, PA-C  Cholecalciferol (VITAMIN D PO) Take 1,000 Units by mouth.    [provider]  COD LIVER OIL PO Take by mouth.    [provider]  Cyanocobalamin  (B-12) 1000 MCG TABS Take by mouth.    [provider]  doxycycline (VIBRAMYCIN) 100 MG capsule Take 1 capsule (100 mg total) by mouth 2 (two) times daily for 10 days. 04/07/17 04/17/17  Nalia Honeycutt C, PA-C  hydrALAZINE (APRESOLINE) 25 MG tablet Take 25 mg by mouth 2 (two) times daily.    [provider]  labetalol (NORMODYNE) 300 MG tablet Take 300 mg by mouth 2 (two) times daily.    [provider]  METHADONE HCL PO Take 18 mg by mouth daily.     [provider]  Multiple Vitamin (MULTIVITAMIN WITH MINERALS) TABS tablet Take 1 tablet by mouth daily.    [provider]  Omega-3 Fatty Acids (FISH OIL PO) Take 1 capsule by mouth every morning.    [provider]  predniSONE (DELTASONE) 50 MG tablet Take 1 tablet (50 mg total) by mouth daily for 5 days. 04/07/17 04/12/17  Casimer Russett C, PA-C  UNABLE TO FIND Alpha lipid acid take twice a day    [provider]    Family History Family History  Problem Relation Age of Onset  . Asthma Mother   . Heart disease Mother   . Hypertension Mother   . Asthma Brother   . Diabetes Unknown   . Hypertension Unknown     Social History Social History   Tobacco Use  . Smoking status: Current Every Day Smoker    Packs/day: 0.50    Years: 31.00    Pack years: 15.50    Types: Cigarettes    Last attempt to quit: 04/18/2015    Years since quitting: 1.9  . Smokeless tobacco: Never Used  . Tobacco comment: 06/03/16 started back < 1/2 PPD  Substance Use Topics  . Alcohol use: No  . Drug use: No    Comment: former drug use     Allergies   Patient has no known allergies.   Review of Systems Review of Systems  Constitutional: Negative for chills, fatigue and fever.  HENT: Positive for congestion, rhinorrhea and sore throat. Negative for ear pain, sinus pressure and trouble swallowing.   Respiratory: Positive for cough. Negative for chest tightness and shortness of breath.     Cardiovascular: Negative for chest pain.  Gastrointestinal: Negative for abdominal pain, nausea and vomiting.  Musculoskeletal: Positive for myalgias.  Skin: Negative for rash.  Neurological: Negative for dizziness, light-headedness and headaches.     Physical Exam Triage Vital Signs ED Triage Vitals [04/07/17 1144]  Enc Vitals Group     BP 130/68     Pulse Rate 75     Resp 18     Temp 98.2 F (36.8 C)     Temp Source Oral     SpO2 94 %     Weight  Height      Head Circumference      Peak Flow      Pain Score      Pain Loc      Pain Edu?      Excl. in [place]?    No data found.  Updated Vital Signs BP 130/68   Pulse 75   Temp 98.2 F (36.8 C) (Oral)   Resp 18   LMP 09/09/2014 (Approximate) Comment: none since august  SpO2 94%   Visual Acuity Right Eye Distance:   Left Eye Distance:   Bilateral Distance:    Right Eye Near:   Left Eye Near:    Bilateral Near:     Physical Exam  Constitutional: She appears well-developed and well-nourished. No distress.  HENT:  Head: Normocephalic and atraumatic.  Bilateral TMs nonerythematous, nasal mucosa erythematous without rhinorrhea present.  Posterior oropharynx erythematous, no tonsillar enlargement or exudate.  Eyes: Conjunctivae are normal.  Neck: Neck supple.  Cardiovascular: Normal rate and regular rhythm.  No murmur heard. Pulmonary/Chest: Effort normal and breath sounds normal. No respiratory distress.  Mild expiratory wheezing diffusely throughout lungs.  Breathing comfortably at rest.  Abdominal: Soft. There is no tenderness.  Musculoskeletal: She exhibits no edema.  Neurological: She is alert.  Skin: Skin is warm and dry.  Psychiatric: She has a normal mood and affect.  Nursing note and vitals reviewed.    UC Treatments / Results  Labs (all labs ordered are listed, but only abnormal results are displayed) Labs Reviewed - No data to display  EKG  EKG Interpretation None       Radiology No  results found.  Procedures Procedures (including critical care time)  Medications Ordered in UC Medications - No data to display   Initial Impression / Assessment and Plan / UC Course  I have reviewed the triage vital signs and the nursing notes.  Pertinent labs & imaging results that were available during my care of the patient were reviewed by me and considered in my medical decision making (see chart for details).     Patient likely with viral URI, complicated by COPD.  Patient does have an increased sputum production.  Will provide prednisone for 5 days, doxycycline for 10 days.  Tessalon for cough.  Recommending allergy pill to help with any congestion/postnasal drip. Discussed strict return precautions. Patient verbalized understanding and is agreeable with plan.   Final Clinical Impressions(s) / UC Diagnoses   Final diagnoses:  Viral URI with cough  COPD exacerbation [facility])    ED Discharge Orders        Ordered    predniSONE (DELTASONE) 50 MG tablet  Daily     04/07/17 1337    doxycycline (VIBRAMYCIN) 100 MG capsule  2 times daily     04/07/17 1337    benzonatate (TESSALON) 200 MG capsule  3 times daily PRN     04/07/17 1337       Controlled Substance Prescriptions [place] Controlled Substance Registry consulted? Not Applicable   Janith Lima, [place] 04/07/17 1343

## 2017-04-08 ENCOUNTER — Ambulatory Visit: Payer: 59 | Admitting: Family Medicine

## 2017-04-22 ENCOUNTER — Ambulatory Visit (INDEPENDENT_AMBULATORY_CARE_PROVIDER_SITE_OTHER): Payer: 59 | Admitting: Family Medicine

## 2017-04-22 ENCOUNTER — Telehealth: Payer: Self-pay

## 2017-04-22 VITALS — BP 160/100 | HR 98 | Temp 98.4°F | Ht 65.0 in | Wt 155.2 lb

## 2017-04-22 DIAGNOSIS — G8929 Other chronic pain: Secondary | ICD-10-CM

## 2017-04-22 DIAGNOSIS — M79605 Pain in left leg: Secondary | ICD-10-CM | POA: Diagnosis not present

## 2017-04-22 DIAGNOSIS — Z0000 Encounter for general adult medical examination without abnormal findings: Secondary | ICD-10-CM | POA: Diagnosis not present

## 2017-04-22 DIAGNOSIS — F17200 Nicotine dependence, unspecified, uncomplicated: Secondary | ICD-10-CM

## 2017-04-22 LAB — POCT GLYCOSYLATED HEMOGLOBIN (HGB A1C): Hemoglobin A1C: 6.3

## 2017-04-22 NOTE — Telephone Encounter (Signed)
Called patient to inform her of her appointment for Vascular Ultrasound at   [facility] [address] [place],  [id number] 04/25/2017 @ 0900 They can be reached at [phone] if she needs to change the time.  She was driving and did not have a pen. If she calls back please give her the information.Ozella Almond, CMA

## 2017-04-22 NOTE — Patient Instructions (Addendum)
It was great meeting you today! I am glad that you have chosen to establish care her at [facility]. We are drawing some basic labwork today to see how you are doing from that standpoint. I will let you know the results when this comes back.  I feel like the best workup for your leg pain is an ultrasound. From that we can tell if you are having a blood clot, see if you are having tendon problems, as well as evaluate if you have a cyst behind your knee.  In regards to your smoking cessation I would like to see you back in early April. We can come up with a good plan to finally quit!

## 2017-04-23 LAB — CBC WITH DIFFERENTIAL/PLATELET
Basophils Absolute: 0.1 10*3/uL (ref 0.0–0.2)
Basos: 1 %
EOS (ABSOLUTE): 0.5 10*3/uL — ABNORMAL HIGH (ref 0.0–0.4)
EOS: 7 %
HEMATOCRIT: 39.2 % (ref 34.0–46.6)
HEMOGLOBIN: 12.4 g/dL (ref 11.1–15.9)
Immature Grans (Abs): 0 10*3/uL (ref 0.0–0.1)
Immature Granulocytes: 0 %
LYMPHS ABS: 1.8 10*3/uL (ref 0.7–3.1)
Lymphs: 25 %
MCH: 27.9 pg (ref 26.6–33.0)
MCHC: 31.6 g/dL (ref 31.5–35.7)
MCV: 88 fL (ref 79–97)
MONOCYTES: 6 %
MONOS ABS: 0.4 10*3/uL (ref 0.1–0.9)
Neutrophils Absolute: 4.2 10*3/uL (ref 1.4–7.0)
Neutrophils: 61 %
Platelets: 238 10*3/uL (ref 150–379)
RBC: 4.44 x10E6/uL (ref 3.77–5.28)
RDW: 14.9 % (ref 12.3–15.4)
WBC: 7 10*3/uL (ref 3.4–10.8)

## 2017-04-23 LAB — COMPREHENSIVE METABOLIC PANEL
ALBUMIN: 3.9 g/dL (ref 3.5–5.5)
ALK PHOS: 95 IU/L (ref 39–117)
ALT: 17 IU/L (ref 0–32)
AST: 15 IU/L (ref 0–40)
Albumin/Globulin Ratio: 1.7 (ref 1.2–2.2)
BUN/Creatinine Ratio: 17 (ref 9–23)
BUN: 33 mg/dL — AB (ref 6–24)
Bilirubin Total: <0.2 mg/dL (ref 0.0–1.2)
CO2: 27 mmol/L (ref 20–29)
CREATININE: 1.92 mg/dL — AB (ref 0.57–1.00)
Calcium: 9.7 mg/dL (ref 8.7–10.2)
Chloride: 106 mmol/L (ref 96–106)
GFR calc non Af Amer: 29 mL/min/{1.73_m2} — ABNORMAL LOW (ref >59)
GFR, EST AFRICAN AMERICAN: 34 mL/min/{1.73_m2} — AB (ref >59)
GLOBULIN, TOTAL: 2.3 g/dL (ref 1.5–4.5)
Glucose: 110 mg/dL — ABNORMAL HIGH (ref 65–99)
Potassium: 4.6 mmol/L (ref 3.5–5.2)
SODIUM: 146 mmol/L — AB (ref 134–144)
TOTAL PROTEIN: 6.2 g/dL (ref 6.0–8.5)

## 2017-04-23 LAB — LIPID PANEL
CHOL/HDL RATIO: 3.5 ratio (ref 0.0–4.4)
Cholesterol, Total: 151 mg/dL (ref 100–199)
HDL: 43 mg/dL (ref >39)
LDL CALC: 84 mg/dL (ref 0–99)
TRIGLYCERIDES: 122 mg/dL (ref 0–149)
VLDL Cholesterol Cal: 24 mg/dL (ref 5–40)

## 2017-04-25 ENCOUNTER — Encounter: Payer: Self-pay | Admitting: Family Medicine

## 2017-04-25 ENCOUNTER — Ambulatory Visit (HOSPITAL_COMMUNITY)
Admission: RE | Admit: 2017-04-25 | Payer: 59 | Source: Ambulatory Visit | Attending: Family Medicine | Admitting: Family Medicine

## 2017-04-25 ENCOUNTER — Other Ambulatory Visit: Payer: Self-pay | Admitting: Family Medicine

## 2017-04-25 DIAGNOSIS — G8929 Other chronic pain: Secondary | ICD-10-CM | POA: Insufficient documentation

## 2017-04-25 DIAGNOSIS — M79605 Pain in left leg: Secondary | ICD-10-CM

## 2017-04-25 DIAGNOSIS — Z0000 Encounter for general adult medical examination without abnormal findings: Secondary | ICD-10-CM | POA: Insufficient documentation

## 2017-04-25 DIAGNOSIS — M79606 Pain in leg, unspecified: Secondary | ICD-10-CM

## 2017-04-25 NOTE — Progress Notes (Signed)
  HPI:  Patient presents today for a new patient appointment to establish general primary care, also to discuss leg pain.  Patient reports that she has been having pain behind her left knee. This has been going on for up to a year now. Patient states she has been told she has a cyst although no imaging shows that. She has also been told that it is sequale from a stroke she has in 2016.  ROS: See HPI  Past Medical Hx:  -HTN - CAD - h/o NSTEMI - Stroke 2016 - COPD - Cleft Palata - CKD stage 3b - Tobacco Abuse - Mixed HLD  Past Surgical Hx:  -Coronary Angioplasty - C-Section - Cardiac Cath 2012  Family Hx: updated in Epic  Social Hx: lives at home alone Smokes 1/2 ppd Does not drink or do any illicit drugs  Health Maintenance:  -needs, hep c, hiv, pap smear, colonoscopy, tdap, flu vaccine  PHYSICAL EXAM: BP (!) 160/100 (BP Location: Right Arm, Patient Position: Sitting, Cuff Size: Normal)   Pulse 98   Temp 98.4 F (36.9 C) (Oral)   Ht 5\' 5"  (1.651 m)   Wt 155 lb 3.2 oz (70.4 kg)   LMP 09/09/2014 (Approximate) Comment: none since august  SpO2 94%   BMI 25.83 kg/m  Gen: alert, oriented, no acute distress, african [place] female HEENT: pinpoint pupils, non-reactive, eomi, no conjunctival injections Heart: rrr, no m/r/g. Palpable peripheral pulses Lungs: lungs clear to ausculation bilaterally, symmetric chest rise, no distress Abdomen: soft, non-tender, non-distended Neuro: aox3, no focal neuro deficits, 5/5 strength BUE, BLE Extremities: Palpable tenderness at pes anserinus of LLE, no tenderness behind knee on LLE.  ASSESSMENT/PLAN:  # Health maintenance:  --needs, hep c, hiv, pap smear, colonoscopy, tdap, flu vaccine - will draw cbc, cmp, lipid panel, a1c to establish baseline labs  Chronic pain of left lower extremity Has been going on for at least one year. Will get LLE venous duplex to rule out any dvt or baker cyst. Pain appears to be more in pes anserinus  area. If above abnormalities are rule out will likely pursue msk causes for her pain.  Smoking Patient is very motivated to quit saying that it was the biggest issue affecting her now. Will bring her back in 3-4 weeks to discuss smoking cessation.     FOLLOW UP: Follow up in 3 weeks for smoking cessation  Guadalupe Dawn MD PGY-1 Family Medicine Resident

## 2017-04-25 NOTE — Assessment & Plan Note (Signed)
Has been going on for at least one year. Will get LLE venous duplex to rule out any dvt or baker cyst. Pain appears to be more in pes anserinus area. If above abnormalities are rule out will likely pursue msk causes for her pain.

## 2017-04-25 NOTE — Assessment & Plan Note (Signed)
Patient is very motivated to quit saying that it was the biggest issue affecting her now. Will bring her back in 3-4 weeks to discuss smoking cessation.

## 2017-04-26 ENCOUNTER — Telehealth: Payer: Self-pay | Admitting: Family Medicine

## 2017-04-26 NOTE — Telephone Encounter (Signed)
Called patient and informed her of her laboratory values. Informed her that a1c, cbc, and lipid panel all look excellent. Informed patient that her kidney numbers are stable from 2 years ago. While she does still have ckd 3b/4 there has been no change. Patient appreciative of news. Will see her in early April for smoking cessation.  Guadalupe Dawn MD PGY-1 Family Medicine Resident

## 2017-04-29 ENCOUNTER — Ambulatory Visit (HOSPITAL_COMMUNITY)
Admission: RE | Admit: 2017-04-29 | Discharge: 2017-04-29 | Disposition: A | Discharge status: Home / Self Care | Payer: 59 | Source: Ambulatory Visit | Attending: Cardiovascular Disease | Admitting: Cardiovascular Disease

## 2017-04-29 DIAGNOSIS — M79606 Pain in leg, unspecified: Secondary | ICD-10-CM | POA: Insufficient documentation

## 2017-05-18 ENCOUNTER — Encounter: Payer: Self-pay | Admitting: Family Medicine

## 2017-05-18 ENCOUNTER — Ambulatory Visit (INDEPENDENT_AMBULATORY_CARE_PROVIDER_SITE_OTHER): Payer: 59 | Admitting: Family Medicine

## 2017-05-18 ENCOUNTER — Other Ambulatory Visit: Payer: Self-pay

## 2017-05-18 VITALS — BP 140/82 | HR 72 | Temp 98.2°F | Ht 65.0 in | Wt 155.0 lb

## 2017-05-18 DIAGNOSIS — G8929 Other chronic pain: Secondary | ICD-10-CM | POA: Diagnosis not present

## 2017-05-18 DIAGNOSIS — F1120 Opioid dependence, uncomplicated: Secondary | ICD-10-CM

## 2017-05-18 DIAGNOSIS — M79605 Pain in left leg: Secondary | ICD-10-CM

## 2017-05-18 MED ORDER — LIDOCAINE 5 % EX OINT: 1.0000 "application " | TOPICAL_OINTMENT | CUTANEOUS | 0 refills | Status: DC | PRN

## 2017-05-18 NOTE — Progress Notes (Signed)
   HPI 52 year old who presents with chronic pain of her RLE. She states that this pain has been going on for over a year. It has not changed since she was last seen by me in clinic. Patient did have an ultrasound which ruled out dvt and baker's cyst. Patient has been previously diagnosed with gait abnormality which is a likely sequelae from her previous stroke. She has tried numerous options which have done very little to relive the pain. Patient is on methadone and states that this does help her pain. She is trying to wean off of the methadone and is thus looking for a way to manage this pain chronically. He pain remains in the same place as there previous clinic visit.   CC: right leg pain   ROS:   Review of Systems See HPI for ROS.   CC, SH/smoking status, and VS noted  Objective: BP 140/82   Pulse 72   Temp 98.2 F (36.8 C) (Oral)   Ht 5\' 5"  (1.651 m)   Wt 155 lb (70.3 kg)   LMP 09/09/2014 (Approximate) Comment: none since august  SpO2 92%   BMI 25.79 kg/m  Gen: alert, oriented, no acute distress, african [place] female HEENT: pinpoint pupils, non-reactive, eomi, no conjunctival injections Heart: rrr, no m/r/g. Palpable peripheral pulses Lungs: lungs clear to ausculation bilaterally, symmetric chest rise, no distress Abdomen: soft, non-tender, non-distended Neuro: aox3, no focal neuro deficits, 5/5 strength BUE, BLE Extremities: Palpable tenderness at pes anserinus of LLE, no tenderness behind knee on LLE.   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 States that she is mainly using it for analgesic purposes and would like to be pain free so that she can continue to wean off. Has tried suboxone before but it "did not agree with her". Will defer current management to methadone clinic.  Chronic pain of left lower extremity Pain is likely musculoskeletal after BLE ultrasound revealed no dvt or baker's cyst. Patient likely  has gait abnormality stemming from stroke which has caused her significant gait alteration. Likely has chronic msk pain from this. Would also like xray of need to see if OA is perhaps playing a role in her pain as well. Will trial her on lidocaine ointment, and ask for her to come back in 3-4 weeks as needed. - RLE knee xray - lidocaine ointment   Orders Placed This Encounter  Procedures  . DG Knee Complete 4 Views Left    Standing Status:   Future    Standing Expiration Date:   07/19/2018    Order Specific Question:   Reason for Exam (SYMPTOM  OR DIAGNOSIS REQUIRED)    Answer:   left knee pain    Order Specific Question:   Is patient pregnant?    Answer:   No    Order Specific Question:   Preferred imaging location?    Answer:   [facility]    Order Specific Question:   Radiology Contrast Protocol - do NOT remove file path    Answer:   [email or web address]    Meds ordered this encounter  Medications  . lidocaine (XYLOCAINE) 5 % ointment    Sig: Apply 1 application topically as needed.    Dispense:  50 g    Refill:  0     Guadalupe Dawn MD PGY-1 Family Medicine Resident  05/19/2017 7:52 AM

## 2017-05-18 NOTE — Patient Instructions (Signed)
It was great seeing you again! I am sorry that you are still having such a hard time with your leg. The good news is that we determined it was not a dvt or a cyst based off of the ultrasound. I think it is musculoskeletal with perhaps some aftereffects of the stroke. I would like to try a prescription for lidocaine jelly. This is a topical local anaesthetic that is absorbed through the skin. You can use this up to 4 times a day. I would recommend applying it before you go to work or when you might be more active as this is when you are having your symptoms. I would also like to get a left leg xray to see if you are perhaps having arthritis in your knee. Please come back and see me in 3 weeks.

## 2017-05-19 ENCOUNTER — Encounter: Payer: Self-pay | Admitting: Family Medicine

## 2017-05-19 DIAGNOSIS — F1120 Opioid dependence, uncomplicated: Secondary | ICD-10-CM | POA: Insufficient documentation

## 2017-05-19 NOTE — Assessment & Plan Note (Signed)
Patient has desire to wean off of methadone. States that she is mainly using it for analgesic purposes and would like to be pain free so that she can continue to wean off. Has tried suboxone before but it "did not agree with her". Will defer current management to methadone clinic.

## 2017-05-19 NOTE — Assessment & Plan Note (Signed)
Pain is likely musculoskeletal after BLE ultrasound revealed no dvt or baker's cyst. Patient likely has gait abnormality stemming from stroke which has caused her significant gait alteration. Likely has chronic msk pain from this. Would also like xray of need to see if OA is perhaps playing a role in her pain as well. Will trial her on lidocaine ointment, and ask for her to come back in 3-4 weeks as needed. - RLE knee xray - lidocaine ointment

## 2017-07-05 ENCOUNTER — Telehealth: Payer: Self-pay | Admitting: Family Medicine

## 2017-07-05 NOTE — Telephone Encounter (Signed)
Samekia with [facility] called checking on the status of some paperwork they have sent over to get signed. They have sent the paperwork 3 different times in the last 2 months and have not gotten a response yet. The papers have been placed in [place] box to be signed. Please advise

## 2017-07-13 ENCOUNTER — Encounter: Payer: Self-pay | Admitting: Internal Medicine

## 2017-07-13 ENCOUNTER — Ambulatory Visit (INDEPENDENT_AMBULATORY_CARE_PROVIDER_SITE_OTHER): Payer: 59 | Admitting: Internal Medicine

## 2017-07-13 ENCOUNTER — Other Ambulatory Visit: Payer: Self-pay

## 2017-07-13 DIAGNOSIS — I10 Essential (primary) hypertension: Secondary | ICD-10-CM | POA: Diagnosis not present

## 2017-07-13 MED ORDER — AMLODIPINE BESYLATE 10 MG PO TABS: 10.0000 mg | ORAL_TABLET | Freq: Every day | ORAL | 0 refills | Status: AC

## 2017-07-13 MED ORDER — LABETALOL HCL 300 MG PO TABS: 300.0000 mg | ORAL_TABLET | Freq: Two times a day (BID) | ORAL | 0 refills | Status: DC

## 2017-07-13 MED ORDER — HYDRALAZINE HCL 25 MG PO TABS: 25.0000 mg | ORAL_TABLET | Freq: Two times a day (BID) | ORAL | 0 refills | Status: DC

## 2017-07-13 NOTE — Patient Instructions (Signed)
It was so nice to meet you!  I have sent in a refill of your blood pressure medications.  Please come back to see [place] in 2 weeks for a blood pressure check.  -Dr. Brett Albino   DASH Eating Plan DASH stands for "Dietary Approaches to Stop Hypertension." The DASH eating plan is a healthy eating plan that has been shown to reduce high blood pressure (hypertension). It may also reduce your risk for type 2 diabetes, heart disease, and stroke. The DASH eating plan may also help with weight loss. What are tips for following this plan? General guidelines  Avoid eating more than 2,300 mg (milligrams) of salt (sodium) a day. If you have hypertension, you may need to reduce your sodium intake to 1,500 mg a day.  Limit alcohol intake to no more than 1 drink a day for nonpregnant women and 2 drinks a day for men. One drink equals 12 oz of beer, 5 oz of wine, or 1 oz of hard liquor.  Work with your health care provider to maintain a healthy body weight or to lose weight. Ask what an ideal weight is for you.  Get at least 30 minutes of exercise that causes your heart to beat faster (aerobic exercise) most days of the week. Activities may include walking, swimming, or biking.  Work with your health care provider or diet and nutrition specialist (dietitian) to adjust your eating plan to your individual calorie needs. Reading food labels  Check food labels for the amount of sodium per serving. Choose foods with less than 5 percent of the Daily Value of sodium. Generally, foods with less than 300 mg of sodium per serving fit into this eating plan.  To find whole grains, look for the word "whole" as the first word in the ingredient list. Shopping  Buy products labeled as "low-sodium" or "no salt added."  Buy fresh foods. Avoid canned foods and premade or frozen meals. Cooking  Avoid adding salt when cooking. Use salt-free seasonings or herbs instead of table salt or sea salt. Check with your health care  provider or pharmacist before using salt substitutes.  Do not fry foods. Cook foods using healthy methods such as baking, boiling, grilling, and broiling instead.  Cook with heart-healthy oils, such as olive, canola, soybean, or sunflower oil. Meal planning   Eat a balanced diet that includes: ? 5 or more servings of fruits and vegetables each day. At each meal, try to fill half of your plate with fruits and vegetables. ? Up to 6-8 servings of whole grains each day. ? Less than 6 oz of lean meat, poultry, or fish each day. A 3-oz serving of meat is about the same size as a deck of cards. One egg equals 1 oz. ? 2 servings of low-fat dairy each day. ? A serving of nuts, seeds, or beans 5 times each week. ? Heart-healthy fats. Healthy fats called Omega-3 fatty acids are found in foods such as flaxseeds and coldwater fish, like sardines, salmon, and mackerel.  Limit how much you eat of the following: ? Canned or prepackaged foods. ? Food that is high in trans fat, such as fried foods. ? Food that is high in saturated fat, such as fatty meat. ? Sweets, desserts, sugary drinks, and other foods with added sugar. ? Full-fat dairy products.  Do not salt foods before eating.  Try to eat at least 2 vegetarian meals each week.  Eat more home-cooked food and less restaurant, buffet, and fast food.  When eating at a restaurant, ask that your food be prepared with less salt or no salt, if possible. What foods are recommended? The items listed may not be a complete list. Talk with your dietitian about what dietary choices are best for you. Grains Whole-grain or whole-wheat bread. Whole-grain or whole-wheat pasta. Brown rice. [place]. Bulgur. Whole-grain and low-sodium cereals. Pita bread. Low-fat, low-sodium crackers. Whole-wheat flour tortillas. Vegetables Fresh or frozen vegetables (raw, steamed, roasted, or grilled). Low-sodium or reduced-sodium tomato and vegetable juice. Low-sodium or  reduced-sodium tomato sauce and tomato paste. Low-sodium or reduced-sodium canned vegetables. Fruits All fresh, dried, or frozen fruit. Canned fruit in natural juice (without added sugar). Meat and other protein foods Skinless chicken or [place]. Ground chicken or [place]. Pork with fat trimmed off. Fish and seafood. Egg whites. Dried beans, peas, or lentils. Unsalted nuts, nut butters, and seeds. Unsalted canned beans. Lean cuts of beef with fat trimmed off. Low-sodium, lean deli meat. Dairy Low-fat (1%) or fat-free (skim) milk. Fat-free, low-fat, or reduced-fat cheeses. Nonfat, low-sodium ricotta or cottage cheese. Low-fat or nonfat yogurt. Low-fat, low-sodium cheese. Fats and oils Soft margarine without trans fats. Vegetable oil. Low-fat, reduced-fat, or light mayonnaise and salad dressings (reduced-sodium). Canola, safflower, olive, soybean, and sunflower oils. Avocado. Seasoning and other foods Herbs. Spices. Seasoning mixes without salt. Unsalted popcorn and pretzels. Fat-free sweets. What foods are not recommended? The items listed may not be a complete list. Talk with your dietitian about what dietary choices are best for you. Grains Baked goods made with fat, such as croissants, muffins, or some breads. Dry pasta or rice meal packs. Vegetables Creamed or fried vegetables. Vegetables in a cheese sauce. Regular canned vegetables (not low-sodium or reduced-sodium). Regular canned tomato sauce and paste (not low-sodium or reduced-sodium). Regular tomato and vegetable juice (not low-sodium or reduced-sodium). Angie Fava. Olives. Fruits Canned fruit in a light or heavy syrup. Fried fruit. Fruit in cream or butter sauce. Meat and other protein foods Fatty cuts of meat. Ribs. Fried meat. Berniece Salines. Sausage. Bologna and other processed lunch meats. Salami. Fatback. Hotdogs. Bratwurst. Salted nuts and seeds. Canned beans with added salt. Canned or smoked fish. Whole eggs or egg yolks. Chicken or [place]  with skin. Dairy Whole or 2% milk, cream, and half-and-half. Whole or full-fat cream cheese. Whole-fat or sweetened yogurt. Full-fat cheese. Nondairy creamers. Whipped toppings. Processed cheese and cheese spreads. Fats and oils Butter. Stick margarine. Lard. Shortening. Ghee. Bacon fat. Tropical oils, such as coconut, palm kernel, or palm oil. Seasoning and other foods Salted popcorn and pretzels. Onion salt, garlic salt, seasoned salt, table salt, and sea salt. Worcestershire sauce. Tartar sauce. Barbecue sauce. Teriyaki sauce. Soy sauce, including reduced-sodium. Steak sauce. Canned and packaged gravies. Fish sauce. Oyster sauce. Cocktail sauce. Horseradish that you find on the shelf. Ketchup. Mustard. Meat flavorings and tenderizers. Bouillon cubes. Hot sauce and Tabasco sauce. Premade or packaged marinades. Premade or packaged taco seasonings. Relishes. Regular salad dressings. Where to find more information:  National Heart, Lung, and [place]: https://wilson-eaton.com/  American Heart Association: www.heart.org Summary  The DASH eating plan is a healthy eating plan that has been shown to reduce high blood pressure (hypertension). It may also reduce your risk for type 2 diabetes, heart disease, and stroke.  With the DASH eating plan, you should limit salt (sodium) intake to 2,300 mg a day. If you have hypertension, you may need to reduce your sodium intake to 1,500 mg a day.  When on the DASH eating plan,  aim to eat more fresh fruits and vegetables, whole grains, lean proteins, low-fat dairy, and heart-healthy fats.  Work with your health care provider or diet and nutrition specialist (dietitian) to adjust your eating plan to your individual calorie needs. This information is not intended to replace advice given to you by your health care provider. Make sure you discuss any questions you have with your health care provider. Document Released: 01/14/2011 Document Revised: 01/19/2016  Document Reviewed: 01/19/2016 Elsevier Interactive Patient Education  2018 [employer].   Diet Recommendations for Diabetes  Carbohydrate includes starch, sugar, and fiber.  Of these, only sugar and starch raise blood glucose.  (Fiber is found in fruits, vegetables [especially skin, seeds, and stalks] and whole grains.)   Starchy (carb) foods: Bread, rice, pasta, potatoes, corn, cereal, grits, crackers, bagels, muffins, all baked goods.  (Fruit, milk, and yogurt also have carbohydrate, but most of these foods will not spike your blood sugar as most starchy foods will.)  A few fruits do cause high blood sugars; use small portions of bananas (limit to 1/2 at a time), grapes, watermelon, oranges, and most tropical fruits.   Protein foods: Meat, fish, poultry, eggs, dairy foods, and beans such as pinto and kidney beans (beans also provide carbohydrate).   1. Eat at least REAL 3 meals and 1-2 snacks per day. Never go more than 4-5 hours while awake without eating. Eat breakfast within the first hour of getting up.   2. Limit starchy foods to TWO per meal and ONE per snack. ONE portion of a starchy  food is equal to the following:   - ONE slice of bread (or its equivalent, such as half of a hamburger bun).   - 1/2 cup of a "scoopable" starchy food such as potatoes or rice.   - 15 grams of Total Carbohydrate as shown on food label.  3. Include at every meal: a protein food, a carb food, and vegetables and/or fruit.   - Obtain twice the volume of veg's as protein or carbohydrate foods for both lunch and dinner.   - Fresh or frozen veg's are best.   - Keep frozen veg's on hand for a quick vegetable serving.

## 2017-07-13 NOTE — Progress Notes (Signed)
   [facility] Phone: [phone]  Subjective:  Karla Nelson is a 52 year old female presenting to clinic for high blood pressure. She states that she ran out of her meds ~1 week ago and was told that she would need an appointment for any additional refills. She has been out of the Labetalol for 6 days. She also stopped taking the Norvasc (even though she didn't run out) because she didn't know if she could keep taking the Norvasc without also taking the Labetalol. She has been taking the Hydralazine as prescribed. She has not been checking her blood pressures at home. She denies any chest pain, shortness of breath, lower extremity edema, headaches, dizziness, or vision changes.  ROS: See HPI for pertinent positives and negatives  Past Medical History- hx CVA, CAD w/ hx NSTEMI, HTN, COPD, CKD III, HLD  Family history reviewed for today's visit. No changes.  Social history- patient is a current smoker, but is using nicotine patches to try to quit.  Objective: BP (!) 175/100   Pulse (!) 52   Temp 97.7 F (36.5 C) (Oral)   Wt 147 lb (66.7 kg)   LMP 09/09/2014 (Approximate) Comment: none since august  SpO2 98%   BMI 24.46 kg/m  Gen: NAD, alert, cooperative with exam HEENT: NCAT, EOMI, MMM Neck: FROM, supple CV: RRR, no murmur Resp: CTABL, no wheezes, normal work of breathing Msk: No edema  Assessment/Plan: HTN: Uncontrolled, but has been off her Norvasc and Labetalol for 1 week.  - Refills provided for Norvasc 10mg  daily, Labetalol 300mg  bid, and Hydralazine 25mg  bid - Patient given handout on DASH diet, also counseled on exercising for 30 minutes at least 5 days per week - Follow-up in 2 weeks for blood pressure check   Hyman Bible, MD PGY-3

## 2017-07-13 NOTE — Assessment & Plan Note (Signed)
Uncontrolled, but has been off her Norvasc and Labetalol for 1 week.  - Refills provided for Norvasc 10mg  daily, Labetalol 300mg  bid, and Hydralazine 25mg  bid - Patient given handout on DASH diet, also counseled on exercising for 30 minutes at least 5 days per week - Follow-up in 2 weeks for blood pressure check

## 2017-07-28 ENCOUNTER — Encounter: Payer: Self-pay | Admitting: Internal Medicine

## 2017-07-28 ENCOUNTER — Ambulatory Visit (INDEPENDENT_AMBULATORY_CARE_PROVIDER_SITE_OTHER): Payer: 59 | Admitting: Internal Medicine

## 2017-07-28 ENCOUNTER — Other Ambulatory Visit: Payer: Self-pay

## 2017-07-28 VITALS — BP 140/88 | HR 70 | Temp 98.4°F | Ht 65.0 in | Wt 148.0 lb

## 2017-07-28 DIAGNOSIS — M25562 Pain in left knee: Secondary | ICD-10-CM

## 2017-07-28 DIAGNOSIS — I10 Essential (primary) hypertension: Secondary | ICD-10-CM

## 2017-07-28 DIAGNOSIS — G8929 Other chronic pain: Secondary | ICD-10-CM

## 2017-07-28 LAB — POCT GLYCOSYLATED HEMOGLOBIN (HGB A1C): Hemoglobin A1C: 5.9 % — AB (ref 4.0–5.6)

## 2017-07-28 MED ORDER — DICLOFENAC SODIUM 1 % TD GEL: 2.0000 g | Freq: Four times a day (QID) | TRANSDERMAL | 0 refills | Status: DC

## 2017-07-28 NOTE — Patient Instructions (Signed)
It was so nice to see you again!  For your knee pain- I have ordered an x-ray of your left knee. Please go to [facility] to have this done. I will call you with your results. I have also prescribed an anti-inflammatory gel. Please use this directly to the area on your knee that hurts.  Your blood pressure looked great today! Please keep taking your medications as prescribed.  Please follow-up with Dr. Kris Mouton in 3 months!  -Dr. Brett Albino

## 2017-07-28 NOTE — Progress Notes (Signed)
   [facility] Phone: [phone]  Subjective:  Karla Nelson is a 52 year old female presenting to clinic for follow-up of her HTN and to discuss left knee pain.  HTN: Last seen 07/13/17 for HTN. Had run out of her BP medications at that visit. She restarted her Norvasc 10mg  daily, Hydralazine 25mg  bid, and Labetalol 300mg  bid. She has not been checking her blood pressures at home. She has not had any side effects since restarting the BP meds. No chest pain, no shortness of breath, no lower extremity edema, no dizziness.  Left Knee Pain: Has been going on for years. The pain is located in the medial side of her knee. The pain is "achy" and "sharp". The pain is worse with walking and better with rest. She endorses popping. No locking or giving out. She denies any redness or swelling. No trauma or injury.  ROS: See HPI for pertinent positives and negatives  Past Medical History- hx CVA, CAD w/ hx NSTEMI, HTN, COPD, CKD III, HLD   Family history reviewed for today's visit. No changes.  Social history- patient is a current smoker.  Objective: BP 140/88 (BP Location: Right Arm, Patient Position: Sitting, Cuff Size: Large)   Pulse 70   Temp 98.4 F (36.9 C) (Oral)   Ht 5\' 5"  (1.651 m)   Wt 148 lb (67.1 kg)   LMP 09/09/2014 (Approximate) Comment: none since august  SpO2 97%   BMI 24.63 kg/m  Gen: NAD, alert, cooperative with exam HEENT: NCAT, EOMI, MMM Neck: FROM, supple CV: RRR, no murmur Resp: CTABL, no wheezes, normal work of breathing GI: SNTND, BS present, no guarding or organomegaly Right Knee: No erythema, edema, or gross deformity; full ROM; mild crepitus; +tenderness to palpation along the medial joint line; valgus and varus stress tests negative; negative Thessaly's; negative anterior and posterior drawer. Neuro: Alert and oriented, no gross deficits Skin: No rashes, no lesions Psych: Appropriate behavior  Assessment/Plan: HTN: Improved after restarting  BP medications. BP 140/88 in clinic today, goal <140/<90. - Continue Norvasc 10mg  daily, Hydralazine 25mg  bid, and Labetalol 300mg  bid  - Follow-up with PCP in 3 months  Chronic Left Knee Pain: Likely osteoarthritis, given that she describes the pain as "achy" and has tenderness to palpation along the medial joint line. Special testing negative, so ligamentous injury less likely. No trauma to suggest fracture. - Will obtain left knee x-rays to evaluate further - Voltaren gel prescribed qid for pain - Follow-up with PCP as needed   Hyman Bible, MD PGY-3

## 2017-07-29 ENCOUNTER — Telehealth: Payer: Self-pay | Admitting: *Deleted

## 2017-07-29 ENCOUNTER — Ambulatory Visit
Admission: RE | Admit: 2017-07-29 | Discharge: 2017-07-29 | Disposition: A | Discharge status: Home / Self Care | Payer: 59 | Source: Ambulatory Visit | Attending: Family Medicine | Admitting: Family Medicine

## 2017-07-29 DIAGNOSIS — G8929 Other chronic pain: Secondary | ICD-10-CM

## 2017-07-29 DIAGNOSIS — M25562 Pain in left knee: Principal | ICD-10-CM

## 2017-07-29 NOTE — Telephone Encounter (Signed)
PA approved.  Faxed approval to [facility] on [address]. Fleeger, Salome Spotted, CMA

## 2017-07-29 NOTE — Telephone Encounter (Signed)
Received PA from [facility].    Per covermymeds diclofenac is preferred, so I changed in covermymeds.  Diclofenac will still require PA per [facility].  Completed in covermymeds and awaiting results.  Key: [id number]  Lynna Zamorano, Salome Spotted, [place]

## 2017-07-29 NOTE — Telephone Encounter (Signed)
Response from covermymeds.    PA on voltaren was N/A, resent as diclofenac and awaiting results. Fleeger, Salome Spotted, CMA

## 2017-08-01 DIAGNOSIS — G8929 Other chronic pain: Secondary | ICD-10-CM | POA: Insufficient documentation

## 2017-08-01 DIAGNOSIS — M25562 Pain in left knee: Principal | ICD-10-CM

## 2017-08-01 NOTE — Assessment & Plan Note (Signed)
Improved after restarting BP medications. BP 140/88 in clinic today, goal <140/<90. - Continue Norvasc 10mg  daily, Hydralazine 25mg  bid, and Labetalol 300mg  bid  - Follow-up with PCP in 3 months

## 2017-08-01 NOTE — Assessment & Plan Note (Signed)
Likely osteoarthritis, given that she describes the pain as "achy" and has tenderness to palpation along the medial joint line. Special testing negative, so ligamentous injury less likely. No trauma to suggest fracture. - Will obtain left knee x-rays to evaluate further - Voltaren gel prescribed qid for pain - Follow-up with PCP as needed

## 2017-08-03 ENCOUNTER — Telehealth: Payer: Self-pay | Admitting: Internal Medicine

## 2017-08-03 NOTE — Telephone Encounter (Signed)
Called patient over the phone and let patient know that her knee x-ray was normal. All questions answered. She should follow-up with her PCP for further management.  Hyman Bible, MD PGY-3

## 2017-08-08 ENCOUNTER — Other Ambulatory Visit: Payer: Self-pay | Admitting: Family Medicine

## 2017-08-08 DIAGNOSIS — Z1231 Encounter for screening mammogram for malignant neoplasm of breast: Secondary | ICD-10-CM

## 2017-08-25 ENCOUNTER — Other Ambulatory Visit: Payer: Self-pay | Admitting: *Deleted

## 2017-08-25 MED ORDER — DICLOFENAC SODIUM 1 % TD GEL: 2.0000 g | Freq: Four times a day (QID) | TRANSDERMAL | 0 refills | Status: DC

## 2017-09-05 ENCOUNTER — Ambulatory Visit: Payer: 59

## 2017-11-02 ENCOUNTER — Ambulatory Visit
Admission: RE | Admit: 2017-11-02 | Discharge: 2017-11-02 | Disposition: A | Discharge status: Home / Self Care | Payer: 59 | Source: Ambulatory Visit | Attending: Family Medicine | Admitting: Family Medicine

## 2017-11-02 DIAGNOSIS — Z1231 Encounter for screening mammogram for malignant neoplasm of breast: Secondary | ICD-10-CM

## 2017-11-07 ENCOUNTER — Other Ambulatory Visit: Payer: Self-pay | Admitting: *Deleted

## 2017-11-07 MED ORDER — LABETALOL HCL 300 MG PO TABS: 300.0000 mg | ORAL_TABLET | Freq: Two times a day (BID) | ORAL | 0 refills | Status: DC

## 2018-01-07 ENCOUNTER — Encounter

## 2018-01-11 ENCOUNTER — Other Ambulatory Visit: Payer: Self-pay

## 2018-01-11 ENCOUNTER — Ambulatory Visit (INDEPENDENT_AMBULATORY_CARE_PROVIDER_SITE_OTHER): Payer: 59 | Admitting: Family Medicine

## 2018-01-11 ENCOUNTER — Encounter: Payer: Self-pay | Admitting: Family Medicine

## 2018-01-11 VITALS — BP 144/88 | HR 82 | Temp 98.2°F | Ht 62.0 in | Wt 149.8 lb

## 2018-01-11 DIAGNOSIS — M23207 Derangement of unspecified meniscus due to old tear or injury, left knee: Secondary | ICD-10-CM | POA: Diagnosis not present

## 2018-01-11 DIAGNOSIS — M5386 Other specified dorsopathies, lumbar region: Secondary | ICD-10-CM | POA: Diagnosis not present

## 2018-01-11 DIAGNOSIS — R531 Weakness: Secondary | ICD-10-CM

## 2018-01-11 DIAGNOSIS — M549 Dorsalgia, unspecified: Secondary | ICD-10-CM

## 2018-01-11 MED ORDER — LORAZEPAM 0.5 MG PO TABS: 0.5000 mg | ORAL_TABLET | Freq: Three times a day (TID) | ORAL | 0 refills | Status: DC

## 2018-01-11 MED ORDER — GABAPENTIN 300 MG PO CAPS: 300.0000 mg | ORAL_CAPSULE | Freq: Two times a day (BID) | ORAL | 0 refills | Status: DC

## 2018-01-11 NOTE — Patient Instructions (Addendum)
It was great seeing you again. I am sorry you have had so much trouble with your back and knee. I would like to get some imaging of your left knee and lower back. This will help me determine if you have undiagnosed soft tissue injuries leading to your chronic pain.  I am concerned that you are having inflammation of your sciatic nerve. We will try gabapentin which is a nerve blocking agent. I think this will help. You can also try over the counter creams. I will also get some lab work to help me determine if you have any inflammation due to your immune system. I will call you with these results.

## 2018-01-12 LAB — CBC WITH DIFFERENTIAL/PLATELET
BASOS ABS: 0.1 10*3/uL (ref 0.0–0.2)
BASOS: 1 %
EOS (ABSOLUTE): 0.8 10*3/uL — AB (ref 0.0–0.4)
Eos: 9 %
Hematocrit: 38.3 % (ref 34.0–46.6)
Hemoglobin: 12.5 g/dL (ref 11.1–15.9)
IMMATURE GRANS (ABS): 0 10*3/uL (ref 0.0–0.1)
Immature Granulocytes: 0 %
Lymphocytes Absolute: 2.1 10*3/uL (ref 0.7–3.1)
Lymphs: 24 %
MCH: 28 pg (ref 26.6–33.0)
MCHC: 32.6 g/dL (ref 31.5–35.7)
MCV: 86 fL (ref 79–97)
MONOS ABS: 0.6 10*3/uL (ref 0.1–0.9)
Monocytes: 7 %
NEUTROS ABS: 5.2 10*3/uL (ref 1.4–7.0)
Neutrophils: 59 %
Platelets: 329 10*3/uL (ref 150–450)
RBC: 4.47 x10E6/uL (ref 3.77–5.28)
RDW: 14.1 % (ref 12.3–15.4)
WBC: 8.8 10*3/uL (ref 3.4–10.8)

## 2018-01-12 LAB — COMPREHENSIVE METABOLIC PANEL
A/G RATIO: 2.2 (ref 1.2–2.2)
ALT: 15 IU/L (ref 0–32)
AST: 17 IU/L (ref 0–40)
Albumin: 4.3 g/dL (ref 3.5–5.5)
Alkaline Phosphatase: 98 IU/L (ref 39–117)
BUN/Creatinine Ratio: 17 (ref 9–23)
BUN: 38 mg/dL — ABNORMAL HIGH (ref 6–24)
CHLORIDE: 103 mmol/L (ref 96–106)
CO2: 25 mmol/L (ref 20–29)
Calcium: 9.4 mg/dL (ref 8.7–10.2)
Creatinine, Ser: 2.29 mg/dL — ABNORMAL HIGH (ref 0.57–1.00)
GFR calc non Af Amer: 24 mL/min/{1.73_m2} — ABNORMAL LOW (ref >59)
GFR, EST AFRICAN AMERICAN: 27 mL/min/{1.73_m2} — AB (ref >59)
GLOBULIN, TOTAL: 2 g/dL (ref 1.5–4.5)
Glucose: 108 mg/dL — ABNORMAL HIGH (ref 65–99)
POTASSIUM: 4.8 mmol/L (ref 3.5–5.2)
SODIUM: 143 mmol/L (ref 134–144)
Total Protein: 6.3 g/dL (ref 6.0–8.5)

## 2018-01-12 LAB — C-REACTIVE PROTEIN: CRP: 2 mg/L (ref 0–10)

## 2018-01-12 LAB — SEDIMENTATION RATE: Sed Rate: 51 mm/hr — ABNORMAL HIGH (ref 0–40)

## 2018-01-12 LAB — CK: Total CK: 174 U/L — ABNORMAL HIGH (ref 24–173)

## 2018-01-12 LAB — TSH: TSH: 3.85 u[IU]/mL (ref 0.450–4.500)

## 2018-01-14 DIAGNOSIS — M549 Dorsalgia, unspecified: Secondary | ICD-10-CM | POA: Insufficient documentation

## 2018-01-14 DIAGNOSIS — M5386 Other specified dorsopathies, lumbar region: Secondary | ICD-10-CM | POA: Insufficient documentation

## 2018-01-14 DIAGNOSIS — M23207 Derangement of unspecified meniscus due to old tear or injury, left knee: Secondary | ICD-10-CM | POA: Insufficient documentation

## 2018-01-14 NOTE — Assessment & Plan Note (Signed)
Patient interested in having her inflammation evaluated.  She is concerned that her upper back pain may be related to an autoimmune disorder she read about Internet.  Get some basic lab work-up to evaluate.  Unless ESR significantly elevated will consider this to be chronic muscular skeletal pain.

## 2018-01-14 NOTE — Progress Notes (Signed)
HPI 52 year old who presents for right-sided weakness.  This is a chronic problem has been having for the last several years.  She has had a known stroke in the past.  Right-sided weakness is likely spastic hemiplegia as a sequelae from her stroke.  Patient is requesting further work-up because she does not believe her your neurologist.  She is understanding is that she was fine for 6 months after her stroke and developed spastic hemiplegia.  Per the neurologist notes this is not true, and patient never had period of totally normal right-sided function after a stroke.  Patient is also complaining of left leg pain.  She has a very confusing history of imaging of her left leg.  She had a left leg ultrasound done at this clinic last visit which showed no Baker's cyst or DVT causing her pain.  She had an MRI of her knee in the recent past but there is nothing in our charting from this and I am not received any fax to reports from the orthopedic clinic she went to.  She states that she has chronic midline tenderness of her left knee.  Occasionally this pain is behind her left knee.  She is a 38 left knee x-ray which showed nothing.  Patient also complaining of burning/shooting electrical pain to both of her legs.  She states that whenever she tries to flex her back this happens.  She does not think this is related to her previous stroke.  CC: Right-sided weakness   ROS:  Review of Systems See HPI for ROS.   CC, SH/smoking status, and VS noted  Objective: BP (!) 144/88   Pulse 82   Temp 98.2 F (36.8 C) (Oral)   Ht 5' 2"  (1.575 m)   Wt 149 lb 12.8 oz (67.9 kg)   LMP 09/09/2014 (Approximate) Comment: none since august  SpO2 94%   BMI 27.40 kg/m  Gen: 51 year old African-American female, resting comfortable in bed, no acute distress HEENT: NCAT, EOMI, PERRL. CV: RRR, no murmur.  Palpable peripheral pulses Resp: CTAB, no wheezes, non-labored Abd: SNTND, BS present, no guarding or  organomegaly Neuro: Alert and oriented, Speech clear, No gross deficits.  Positive straight leg test bilaterally.  Initially has shuffling gait with notable right-sided weakness of her first few steps but does correct a normal gait after this.  This is chronic Back: Palpable tenderness in paraspinal muscles and thoracic spine. Left knee: Negative anterior drawer, negative Lachman test.  Negative McMurray test.  No significant midline tenderness.  No asymmetry noted between left knee and right knee.  Regular strength in both hamstrings and quadriceps.  Less than 2-second capillary refill  Assessment and plan:  Sciatica associated with disorder of lumbar spine Patient with symptoms consistent with sciatica bilaterally.  Explained to patient the bilateral sciatica usually means a disc problem in the lumbar spine.  Patient would like to undergo MRI of spine to evaluate.  She is on the fence over whether she would like to have surgery to fix this depending on what we find.  Gave prescription for 2 doses of Ativan as patient has significant claustrophobia.  Right sided weakness Her right-sided weakness is likely due to her stroke.  She is seen neurology several times and notes are consistent with this is the cause.  She would like further work-up so we will proceed with basic laboratory panel testing CBC, CMP, TSH, ESR, CRP.  Old tear of meniscus of left knee Patient states that she has a known  meniscal tear of her left knee.  She is interested in further work-up for her knee pain.  I explained to patient that no meniscal tear would likely cause her symptoms.  Nonetheless patient requests an MRI to further evaluate ligament integrity.  No findings on physical exam consistent with soft tissue pathology knee.  Upper back pain Patient interested in having her inflammation evaluated.  She is concerned that her upper back pain may be related to an autoimmune disorder she read about Internet.  Get some basic  lab work-up to evaluate.  Unless ESR significantly elevated will consider this to be chronic muscular skeletal pain.   Orders Placed This Encounter  Procedures  . MR Lumbar Spine Wo Contrast    Standing Status:   Future    Standing Expiration Date:   03/15/2019    Order Specific Question:   What is the patient's sedation requirement?    Answer:   Anti-anxiety    Order Specific Question:   Does the patient have a pacemaker or implanted devices?    Answer:   No    Order Specific Question:   Preferred imaging location?    Answer:   [facility] (table limit-500 lbs)    Order Specific Question:   Radiology Contrast Protocol - do NOT remove file path    Answer:   \\[email or web address]  . MR Knee Left  Wo Contrast    Standing Status:   Future    Standing Expiration Date:   03/15/2019    Order Specific Question:   What is the patient's sedation requirement?    Answer:   Anti-anxiety    Order Specific Question:   Does the patient have a pacemaker or implanted devices?    Answer:   No    Order Specific Question:   Preferred imaging location?    Answer:   [facility] (table limit-500 lbs)    Order Specific Question:   Radiology Contrast Protocol - do NOT remove file path    Answer:   \\[email or web address]  . Comprehensive metabolic panel    Order Specific Question:   Has the patient fasted?    Answer:   No  . CBC with Differential  . TSH  . CK  . Sedimentation Rate  . C-reactive protein    Meds ordered this encounter  Medications  . LORazepam (ATIVAN) 0.5 MG tablet    Sig: Take 1 tablet (0.5 mg total) by mouth every 8 (eight) hours. Take 1 tablet 2 hours prior to MRI, and 1 tablet 30 minutes prior to MRI if needed    Dispense:  2 tablet    Refill:  0  . gabapentin (NEURONTIN) 300 MG capsule    Sig: Take 1 capsule (300 mg total) by mouth 2 (two) times daily.    Dispense:  90 capsule    Refill:  0     Guadalupe Dawn  MD PGY-2 Family Medicine Resident  01/14/2018 9:59 PM

## 2018-01-14 NOTE — Assessment & Plan Note (Addendum)
Patient states that she has a known meniscal tear of her left knee.  She is interested in further work-up for her knee pain.  I explained to patient that no meniscal tear would likely cause her symptoms.  Nonetheless patient requests an MRI to further evaluate ligament integrity.  No findings on physical exam consistent with soft tissue pathology knee.

## 2018-01-14 NOTE — Assessment & Plan Note (Addendum)
Patient with symptoms consistent with sciatica bilaterally.  Explained to patient the bilateral sciatica usually means a disc problem in the lumbar spine.  Patient would like to undergo MRI of spine to evaluate.  She is on the fence over whether she would like to have surgery to fix this depending on what we find.  Gave prescription for 2 doses of Ativan as patient has significant claustrophobia.

## 2018-01-14 NOTE — Assessment & Plan Note (Signed)
Her right-sided weakness is likely due to her stroke.  She is seen neurology several times and notes are consistent with this is the cause.  She would like further work-up so we will proceed with basic laboratory panel testing CBC, CMP, TSH, ESR, CRP.

## 2018-01-18 ENCOUNTER — Telehealth: Payer: Self-pay | Admitting: Family Medicine

## 2018-01-18 NOTE — Telephone Encounter (Signed)
Attempted to contact patient regarding recent lab work. Patient's phone went straight to voicemail and then proceeded to say that voicemail was full. Will continue to try and reach patient.  Guadalupe Dawn MD PGY-2 Family Medicine Resident

## 2018-01-20 ENCOUNTER — Ambulatory Visit (HOSPITAL_COMMUNITY)
Admission: RE | Admit: 2018-01-20 | Discharge: 2018-01-20 | Disposition: A | Discharge status: Home / Self Care | Payer: 59 | Source: Ambulatory Visit | Attending: Family Medicine | Admitting: Family Medicine

## 2018-01-20 DIAGNOSIS — M5386 Other specified dorsopathies, lumbar region: Secondary | ICD-10-CM

## 2018-01-20 DIAGNOSIS — M23207 Derangement of unspecified meniscus due to old tear or injury, left knee: Secondary | ICD-10-CM | POA: Diagnosis present

## 2018-01-20 DIAGNOSIS — M5137 Other intervertebral disc degeneration, lumbosacral region: Secondary | ICD-10-CM | POA: Diagnosis not present

## 2018-01-20 DIAGNOSIS — M1712 Unilateral primary osteoarthritis, left knee: Secondary | ICD-10-CM | POA: Diagnosis not present

## 2018-01-22 ENCOUNTER — Ambulatory Visit (INDEPENDENT_AMBULATORY_CARE_PROVIDER_SITE_OTHER): Payer: 59

## 2018-01-22 ENCOUNTER — Encounter (HOSPITAL_COMMUNITY): Payer: Self-pay

## 2018-01-22 ENCOUNTER — Telehealth (HOSPITAL_COMMUNITY): Payer: Self-pay | Admitting: Emergency Medicine

## 2018-01-22 ENCOUNTER — Other Ambulatory Visit: Payer: Self-pay

## 2018-01-22 ENCOUNTER — Ambulatory Visit (HOSPITAL_COMMUNITY)
Admission: EM | Admit: 2018-01-22 | Discharge: 2018-01-22 | Disposition: A | Discharge status: Home / Self Care | Payer: 59 | Attending: Family Medicine | Admitting: Family Medicine

## 2018-01-22 DIAGNOSIS — B9789 Other viral agents as the cause of diseases classified elsewhere: Secondary | ICD-10-CM | POA: Diagnosis not present

## 2018-01-22 DIAGNOSIS — J069 Acute upper respiratory infection, unspecified: Secondary | ICD-10-CM | POA: Diagnosis not present

## 2018-01-22 MED ORDER — PROMETHAZINE-DM 6.25-15 MG/5ML PO SYRP: 5.0000 mL | ORAL_SOLUTION | Freq: Four times a day (QID) | ORAL | 0 refills | Status: AC | PRN

## 2018-01-22 MED ORDER — DOXYCYCLINE HYCLATE 100 MG PO CAPS: 100.0000 mg | ORAL_CAPSULE | Freq: Two times a day (BID) | ORAL | 0 refills | Status: DC

## 2018-01-22 MED ORDER — GUAIFENESIN ER 600 MG PO TB12: 600.0000 mg | ORAL_TABLET | Freq: Two times a day (BID) | ORAL | 0 refills | Status: DC

## 2018-01-22 MED ORDER — BENZONATATE 100 MG PO CAPS: 100.0000 mg | ORAL_CAPSULE | Freq: Three times a day (TID) | ORAL | 0 refills | Status: AC

## 2018-01-22 NOTE — Discharge Instructions (Signed)
Your x-ray did not reveal any bronchitis or pneumonia This is most likely a viral upper respiratory infection I will send in some Tessalon Perles for cough You can even do Mucinex for cough congestion Albuterol inhaler as needed

## 2018-01-22 NOTE — ED Provider Notes (Signed)
[facility]    CSN: [id number] Arrival date & time: 01/22/18  1224     History   Chief Complaint Chief Complaint  Patient presents with  . Cough    HPI Azalee Weimer is a 52 y.o. female.   Patient is a 52 year old female with past medical history of asthma, CAD, COPD, hypertension, hyperlipidemia, stroke, tobacco abuse.  She presents with approximately 1 week of cough, congestion, fatigue, mild shortness of breath.  Patient did recently travel to [place].  Her symptoms have been constant and worsening.  She has not tried anything for her symptoms.  She denies having to use any inhalers.  She denies any associated fever, chills, bodies, sweats. ROS per HPI      Past Medical History:  Diagnosis Date  . Asthma   . CAD (coronary artery disease)    NSTEMI with cath in August 2012 with nonobstructive disease, 50% ostial D1 and 70% distal LCX and PL disease. Normal EF. Has diastolic dysfunction with elevated EDP  . Cleft palate    special denture covers defect like a C, sleeps with device in place  . COPD (chronic obstructive pulmonary disease) (HCC)    o2 and bedtime   . DYSPNEA ON EXERTION 03/08/2007  . ELECTROCARDIOGRAM, ABNORMAL 02/13/2010  . History of oxygen administration    bedtime  . HTN (hypertension)    Has normal renal arteries.  . Hyperlipidemia   . NSTEMI (non-ST elevated myocardial infarction) ([place]) 09-25-2010  . Stroke [facility])    july 2016, Dr Donnella Sham  . Tobacco abuse disorder     Patient Active Problem List   Diagnosis Date Noted  . Sciatica associated with disorder of lumbar spine 01/14/2018  . Old tear of meniscus of left knee 01/14/2018  . Upper back pain 01/14/2018  . Chronic pain of left knee 08/01/2017  . 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 Healthcare maintenance 04/25/2017  . Chronic pain of left lower extremity 04/25/2017  . History of stroke 06/03/2016  . Spastic hemiplegia affecting nondominant side ([place]) 03/26/2015  .  Smoking 12/30/2014  . Chronic obstructive pulmonary disease ([place]) 12/30/2014  . Lacunar infarct, acute ([place]) 12/04/2014  . Acute ischemic stroke ([place]) 09/01/2014  . Stroke ([place]) 09/01/2014  . Right sided weakness   . Smoker 03/15/2014  . History of cocaine use 03/15/2014  . Asthma, moderate persistent 07/26/2013  . 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 Acute sinusitis 04/11/2013  . NSTEMI (non-ST elevated myocardial infarction) ([place]) 06/19/2012  . Acute-on-chronic kidney injury ([place]) 06/19/2012  . COPD exacerbation ([place]) 12/05/2011  . COPD (chronic obstructive pulmonary disease) ([place]) 07/12/2011  . CAD (coronary artery disease) 01/13/2011  . Tobacco abuse 01/13/2011  . Mixed hyperlipidemia 10/13/2010  . ELECTROCARDIOGRAM, ABNORMAL 02/13/2010  . CLEFT PALATE 02/12/2010  . Essential hypertension 03/08/2007  . DYSPNEA ON EXERTION 03/08/2007    Past Surgical History:  Procedure Laterality Date  . CARDIAC CATHETERIZATION  Aug 2012   Moderate nonobstructive multivessel CAD with an EF of 70 to 75%  . CESAREAN SECTION    . CORONARY ANGIOPLASTY     no further intervention last cardiac visit greater than a year    OB History    Gravida      Para      Term      Preterm      AB      Living  1     SAB      TAB      Ectopic  Multiple      Live Births               Home Medications    Prior to Admission medications   Medication Sig Start Date End Date Taking? Authorizing Provider  amLODipine (NORVASC) 10 MG tablet Take 1 tablet (10 mg total) by mouth at bedtime. 07/13/17  Yes Mayo, Pete Pelt, MD  aspirin 325 MG tablet Take 1 tablet (325 mg total) by mouth daily. 09/02/14  Yes Delfina Redwood, MD  atorvastatin (LIPITOR) 20 MG tablet Take 1 tablet (20 mg total) by mouth daily at 6 PM. 09/02/14  Yes Delfina Redwood, MD  Cholecalciferol (VITAMIN D PO) Take 1,000 Units by mouth.   Yes [provider]  hydrALAZINE (APRESOLINE) 25 MG tablet Take 1  tablet (25 mg total) by mouth 2 (two) times daily. 07/13/17  Yes Mayo, Pete Pelt, MD  labetalol (NORMODYNE) 300 MG tablet Take 1 tablet (300 mg total) by mouth 2 (two) times daily. 11/07/17  Yes Guadalupe Dawn, MD  METHADONE HCL PO Take 8 mg by mouth daily.    Yes [provider]  Multiple Vitamin (MULTIVITAMIN WITH MINERALS) TABS tablet Take 1 tablet by mouth daily.   Yes [provider]  UNABLE TO FIND Alpha lipid acid take twice a day   Yes [provider]  benzonatate (TESSALON) 100 MG capsule Take 1 capsule (100 mg total) by mouth every 8 (eight) hours. 01/22/18   Loura Halt A, NP  doxycycline (VIBRAMYCIN) 100 MG capsule Take 1 capsule (100 mg total) by mouth 2 (two) times daily. 01/22/18   Loura Halt A, NP  gabapentin (NEURONTIN) 300 MG capsule Take 1 capsule (300 mg total) by mouth 2 (two) times daily. 01/11/18   Guadalupe Dawn, MD  guaiFENesin (MUCINEX) 600 MG 12 hr tablet Take 1 tablet (600 mg total) by mouth 2 (two) times daily. 01/22/18   Loura Halt A, NP  promethazine-dextromethorphan (PROMETHAZINE-DM) 6.25-15 MG/5ML syrup Take 5 mLs by mouth 4 (four) times daily as needed for cough. 01/22/18   Orvan July, NP    Family History Family History  Problem Relation Age of Onset  . Asthma Mother   . Heart disease Mother   . Hypertension Mother   . Asthma Brother   . Diabetes Other   . Hypertension Other     Social History Social History   Tobacco Use  . Smoking status: Current Every Day Smoker    Packs/day: 0.50    Years: 31.00    Pack years: 15.50    Types: Cigarettes  . Smokeless tobacco: Never Used  Substance Use Topics  . Alcohol use: No  . Drug use: No    Comment: former drug use     Allergies   Patient has no known allergies.   Review of Systems Review of Systems   Physical Exam Triage Vital Signs ED Triage Vitals  Enc Vitals Group     BP 01/22/18 1331 (!) 168/93     Pulse Rate 01/22/18 1331 82     Resp 01/22/18 1331 16       Temp 01/22/18 1331 98.1 F (36.7 C)     Temp Source 01/22/18 1331 Oral     SpO2 01/22/18 1331 (!) 84 %     Weight --      Height --      Head Circumference --      Peak Flow --      Pain Score 01/22/18 1333 0  Pain Loc --      Pain Edu? --      Excl. in [place]? --    No data found.  Updated Vital Signs BP (!) 168/93 (BP Location: Right Arm)   Pulse 82   Temp 98.1 F (36.7 C) (Oral)   Resp 16   LMP 09/09/2014 (Approximate) Comment: none since august  SpO2 98%   Visual Acuity Right Eye Distance:   Left Eye Distance:   Bilateral Distance:    Right Eye Near:   Left Eye Near:    Bilateral Near:     Physical Exam Vitals signs and nursing note reviewed.  Constitutional:      Appearance: Normal appearance.  HENT:     Head: Normocephalic and atraumatic.     Right Ear: Tympanic membrane, ear canal and external ear normal.     Left Ear: Tympanic membrane, ear canal and external ear normal.     Nose: Congestion and rhinorrhea present.  Eyes:     Conjunctiva/sclera: Conjunctivae normal.  Neck:     Musculoskeletal: Normal range of motion.  Cardiovascular:     Rate and Rhythm: Normal rate and regular rhythm.     Heart sounds: Normal heart sounds.  Pulmonary:     Effort: Pulmonary effort is normal. No respiratory distress.     Breath sounds: No stridor. No wheezing, rhonchi or rales.     Comments: Decreased lung sounds to the right lower lobe Chest:     Chest wall: No tenderness.  Musculoskeletal: Normal range of motion.  Lymphadenopathy:     Cervical: No cervical adenopathy.  Skin:    General: Skin is warm and dry.  Neurological:     General: No focal deficit present.     Mental Status: She is alert.  Psychiatric:        Mood and Affect: Mood normal.      UC Treatments / Results  Labs (all labs ordered are listed, but only abnormal results are displayed) Labs Reviewed - No data to display  EKG None  Radiology Dg Chest 2 View  Result Date:  01/22/2018 CLINICAL DATA:  Cough and congestion for a week. EXAM: CHEST - 2 VIEW COMPARISON:  September 01, 2014 FINDINGS: The heart size and mediastinal contours are within normal limits. Both lungs are clear. The visualized skeletal structures are unremarkable. IMPRESSION: No active cardiopulmonary disease. Electronically Signed   By: Dorise Bullion III M.D   On: 01/22/2018 14:55    Procedures Procedures (including critical care time)  Medications Ordered in UC Medications - No data to display  Initial Impression / Assessment and Plan / UC Course  I have reviewed the triage vital signs and the nursing notes.  Pertinent labs & imaging results that were available during my care of the patient were reviewed by me and considered in my medical decision making (see chart for details).     X ray negative for pneumonia or bronchitis.  No concern for PE Most likely viral illness.  Based on pt hx will go ahead and prescribe abx.  Instructed that f her symptoms do not improve then she can go ahead and start taking the abx.  Tessalon pearls for cough,  mucinex for cough and congestion. Follow up as needed for continued or worsening symptoms  Final Clinical Impressions(s) / UC Diagnoses   Final diagnoses:  Viral URI with cough     Discharge Instructions     Your x-ray did not reveal any bronchitis or pneumonia This  is most likely a viral upper respiratory infection I will send in some Tessalon Perles for cough You can even do Mucinex for cough congestion Albuterol inhaler as needed    ED Prescriptions    Medication Sig Dispense Auth. Provider   benzonatate (TESSALON) 100 MG capsule Take 1 capsule (100 mg total) by mouth every 8 (eight) hours. 21 capsule Hosey Burmester A, NP   guaiFENesin (MUCINEX) 600 MG 12 hr tablet Take 1 tablet (600 mg total) by mouth 2 (two) times daily. 15 tablet Karl Knarr A, NP   doxycycline (VIBRAMYCIN) 100 MG capsule Take 1 capsule (100 mg total) by mouth 2  (two) times daily. 20 capsule Loura Halt A, NP     Controlled Substance Prescriptions [place] Controlled Substance Registry consulted? Not Applicable   Orvan July, NP 01/23/18 1017

## 2018-01-22 NOTE — ED Triage Notes (Signed)
Pt presents today with cold sxs, Congestion, productive cough, and nasal drainage that has been going on for about a week. Has not tried any OTC medications to help with sxs.

## 2018-01-24 ENCOUNTER — Telehealth: Payer: Self-pay | Admitting: Family Medicine

## 2018-01-24 NOTE — Telephone Encounter (Signed)
Attempted to call patient with MRI results for both knee and back. Went straight to [employer] said it was full so no message left. Will continue trying.  Guadalupe Dawn MD PGY-2 Family Medicine Resident

## 2018-02-18 ENCOUNTER — Other Ambulatory Visit: Payer: Self-pay | Admitting: Family Medicine

## 2018-03-29 ENCOUNTER — Ambulatory Visit (HOSPITAL_COMMUNITY)
Admission: EM | Admit: 2018-03-29 | Discharge: 2018-03-29 | Disposition: A | Discharge status: Home / Self Care | Payer: 59 | Attending: Internal Medicine | Admitting: Internal Medicine

## 2018-03-29 ENCOUNTER — Telehealth (HOSPITAL_COMMUNITY): Payer: Self-pay | Admitting: Family Medicine

## 2018-03-29 ENCOUNTER — Encounter (HOSPITAL_COMMUNITY): Payer: Self-pay | Admitting: Emergency Medicine

## 2018-03-29 ENCOUNTER — Telehealth (HOSPITAL_COMMUNITY): Payer: Self-pay | Admitting: Emergency Medicine

## 2018-03-29 DIAGNOSIS — R109 Unspecified abdominal pain: Secondary | ICD-10-CM | POA: Insufficient documentation

## 2018-03-29 HISTORY — DX: Disorder of kidney and ureter, unspecified: N28.9

## 2018-03-29 LAB — POCT URINALYSIS DIP (DEVICE)
Bilirubin Urine: NEGATIVE
Glucose, UA: 500 mg/dL — AB
Hgb urine dipstick: NEGATIVE
Ketones, ur: NEGATIVE mg/dL
Leukocytes,Ua: NEGATIVE
NITRITE: NEGATIVE
PROTEIN: NEGATIVE mg/dL
Specific Gravity, Urine: <=1.005 (ref 1.005–1.030)
Urobilinogen, UA: 0.2 mg/dL (ref 0.0–1.0)
pH: 6 (ref 5.0–8.0)

## 2018-03-29 LAB — BASIC METABOLIC PANEL
Anion gap: 8 (ref 5–15)
BUN: 26 mg/dL — AB (ref 6–20)
CO2: 29 mmol/L (ref 22–32)
Calcium: 9.4 mg/dL (ref 8.9–10.3)
Chloride: 103 mmol/L (ref 98–111)
Creatinine, Ser: 1.72 mg/dL — ABNORMAL HIGH (ref 0.44–1.00)
GFR calc Af Amer: 38 mL/min — ABNORMAL LOW (ref >60)
GFR calc non Af Amer: 33 mL/min — ABNORMAL LOW (ref >60)
Glucose, Bld: 131 mg/dL — ABNORMAL HIGH (ref 70–99)
Potassium: 3.8 mmol/L (ref 3.5–5.1)
SODIUM: 140 mmol/L (ref 135–145)

## 2018-03-29 MED ORDER — DICLOFENAC SODIUM 1 % TD GEL: 4.0000 g | Freq: Four times a day (QID) | TRANSDERMAL | 0 refills | Status: AC

## 2018-03-29 NOTE — Telephone Encounter (Signed)
Patient came up to facility stating she had gone to the pharmacy and no prescription was available.  Spoke to provider who saw patient and states patient stated she had medication at home.  This RN unable to get out to lobby to speak to patient prior to patient leaving, so I called patient to speak to her and confirmed I will send in Voltaren gel for patient to pick up

## 2018-03-29 NOTE — Discharge Instructions (Addendum)
I believe that your pain is muscle related. Try using the Voltaren gel on the area a few times a day We will check your kidney function here before you leave and I will call you with any problems.

## 2018-03-29 NOTE — ED Triage Notes (Signed)
Pt c/o R flank pain for several days, states she sees a "kidney doctor". For kidney failure.

## 2018-03-29 NOTE — Telephone Encounter (Signed)
Pt requesting refill on Voltaren gel

## 2018-03-30 NOTE — ED Provider Notes (Signed)
[facility]    CSN: [id number] Arrival date & time: 03/29/18  7616     History   Chief Complaint Chief Complaint  Patient presents with  . Flank Pain    HPI Karla Nelson is a 53 y.o. female.   Patient is a 53 year old female that presents with right flank pain over the last couple days.  Symptoms been constant and remain the same.  Symptoms are exacerbated by twisting and bending.  Denies any injuries to the back but has been doing some lifting at her job.  She works at [employer].  Pain is elicited also by palpation.  Describes it as sharp at times and aching at others.  denies any associated dysuria, hematuria, urinary frequency, fevers, chills, night sweats.  No numbness, tingling or weakness.  She has been taking Tylenol for her pain without much relief.  Sent here by her doctor to check kidney function and possible imaging.  ROS per HPI      Past Medical History:  Diagnosis Date  . Asthma   . CAD (coronary artery disease)    NSTEMI with cath in August 2012 with nonobstructive disease, 50% ostial D1 and 70% distal LCX and PL disease. Normal EF. Has diastolic dysfunction with elevated EDP  . Cleft palate    special denture covers defect like a C, sleeps with device in place  . COPD (chronic obstructive pulmonary disease) (HCC)    o2 and bedtime   . DYSPNEA ON EXERTION 03/08/2007  . ELECTROCARDIOGRAM, ABNORMAL 02/13/2010  . History of oxygen administration    bedtime  . HTN (hypertension)    Has normal renal arteries.  . Hyperlipidemia   . NSTEMI (non-ST elevated myocardial infarction) ([place]) 09-25-2010  . Renal disorder   . Stroke [facility])    july 2016, Dr Donnella Sham  . Tobacco abuse disorder     Patient Active Problem List   Diagnosis Date Noted  . Sciatica associated with disorder of lumbar spine 01/14/2018  . Old tear of meniscus of left knee 01/14/2018  . Upper back pain 01/14/2018  . Chronic pain of left knee 08/01/2017  . 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 Healthcare maintenance 04/25/2017  . Chronic pain of left lower extremity 04/25/2017  . History of stroke 06/03/2016  . Spastic hemiplegia affecting nondominant side ([place]) 03/26/2015  . Smoking 12/30/2014  . Chronic obstructive pulmonary disease ([place]) 12/30/2014  . Lacunar infarct, acute ([place]) 12/04/2014  . Acute ischemic stroke ([place]) 09/01/2014  . Stroke ([place]) 09/01/2014  . Right sided weakness   . Smoker 03/15/2014  . History of cocaine use 03/15/2014  . Asthma, moderate persistent 07/26/2013  . 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 Acute sinusitis 04/11/2013  . NSTEMI (non-ST elevated myocardial infarction) ([place]) 06/19/2012  . Acute-on-chronic kidney injury ([place]) 06/19/2012  . COPD exacerbation ([place]) 12/05/2011  . COPD (chronic obstructive pulmonary disease) ([place]) 07/12/2011  . CAD (coronary artery disease) 01/13/2011  . Tobacco abuse 01/13/2011  . Mixed hyperlipidemia 10/13/2010  . ELECTROCARDIOGRAM, ABNORMAL 02/13/2010  . CLEFT PALATE 02/12/2010  . Essential hypertension 03/08/2007  . DYSPNEA ON EXERTION 03/08/2007    Past Surgical History:  Procedure Laterality Date  . CARDIAC CATHETERIZATION  Aug 2012   Moderate nonobstructive multivessel CAD with an EF of 70 to 75%  . CESAREAN SECTION    . CORONARY ANGIOPLASTY     no further intervention last cardiac visit greater than a year    OB History    [place]  Para      Term      Preterm      AB      Living  1     SAB      TAB      Ectopic      Multiple      Live Births               Home Medications    Prior to Admission medications   Medication Sig Start Date End Date Taking? Authorizing Provider  amLODipine (NORVASC) 10 MG tablet Take 1 tablet (10 mg total) by mouth at bedtime. 07/13/17   Mayo, Pete Pelt, MD  aspirin 325 MG tablet Take 1 tablet (325 mg total) by mouth daily. 09/02/14   Delfina Redwood, MD  atorvastatin (LIPITOR) 20 MG tablet Take 1 tablet (20 mg total) by  mouth daily at 6 PM. 09/02/14   Delfina Redwood, MD  benzonatate (TESSALON) 100 MG capsule Take 1 capsule (100 mg total) by mouth every 8 (eight) hours. 01/22/18   Loura Halt A, NP  Cholecalciferol (VITAMIN D PO) Take 1,000 Units by mouth.    [provider]  diclofenac sodium (VOLTAREN) 1 % GEL Apply 4 g topically 4 (four) times daily. 03/29/18   Loura Halt A, NP  hydrALAZINE (APRESOLINE) 25 MG tablet Take 1 tablet (25 mg total) by mouth 2 (two) times daily. 07/13/17   Mayo, Pete Pelt, MD  labetalol (NORMODYNE) 300 MG tablet Take 1 tablet (300 mg total) by mouth 2 (two) times daily. 11/07/17   Guadalupe Dawn, MD  METHADONE HCL PO Take 8 mg by mouth daily.     [provider]  Multiple Vitamin (MULTIVITAMIN WITH MINERALS) TABS tablet Take 1 tablet by mouth daily.    [provider]  promethazine-dextromethorphan (PROMETHAZINE-DM) 6.25-15 MG/5ML syrup Take 5 mLs by mouth 4 (four) times daily as needed for cough. 01/22/18   Orvan July, NP  UNABLE TO FIND Alpha lipid acid take twice a day    [provider]    Family History Family History  Problem Relation Age of Onset  . Asthma Mother   . Heart disease Mother   . Hypertension Mother   . Asthma Brother   . Diabetes Other   . Hypertension Other     Social History Social History   Tobacco Use  . Smoking status: Current Every Day Smoker    Packs/day: 0.50    Years: 31.00    Pack years: 15.50    Types: Cigarettes  . Smokeless tobacco: Never Used  Substance Use Topics  . Alcohol use: No  . Drug use: No    Comment: former drug use     Allergies   Patient has no known allergies.   Review of Systems Review of Systems   Physical Exam Triage Vital Signs ED Triage Vitals  Enc Vitals Group     BP 03/29/18 1041 (!) 172/95     Pulse Rate 03/29/18 1041 76     Resp 03/29/18 1041 18     Temp 03/29/18 1044 97.6 F (36.4 C)     Temp src --      SpO2 03/29/18 1041 99 %     Weight --       Height --      Head Circumference --      Peak Flow --      Pain Score 03/29/18 1043 9     Pain Loc --  Pain Edu? --      Excl. in [place]? --    No data found.  Updated Vital Signs BP (!) 172/95   Pulse 76   Temp 97.6 F (36.4 C)   Resp 18   LMP 09/09/2014 (Approximate) Comment: none since august  SpO2 99%   Visual Acuity Right Eye Distance:   Left Eye Distance:   Bilateral Distance:    Right Eye Near:   Left Eye Near:    Bilateral Near:     Physical Exam Vitals signs and nursing note reviewed.  Constitutional:      General: She is not in acute distress.    Appearance: Normal appearance. She is not ill-appearing, toxic-appearing or diaphoretic.  HENT:     Head: Normocephalic and atraumatic.     Nose: Nose normal.  Neck:     Musculoskeletal: Normal range of motion.  Cardiovascular:     Rate and Rhythm: Normal rate and regular rhythm.  Pulmonary:     Effort: Pulmonary effort is normal.  Abdominal:     Palpations: Abdomen is soft.     Tenderness: There is no abdominal tenderness.  Musculoskeletal: Normal range of motion.        General: Tenderness present. No swelling, deformity or signs of injury.       Arms:  Skin:    General: Skin is warm and dry.     Findings: No rash.  Neurological:     Mental Status: She is alert.  Psychiatric:        Mood and Affect: Mood normal.      UC Treatments / Results  Labs (all labs ordered are listed, but only abnormal results are displayed) Labs Reviewed  BASIC METABOLIC PANEL - Abnormal; Notable for the following components:      Result Value   Glucose, Bld 131 (*)    BUN 26 (*)    Creatinine, Ser 1.72 (*)    GFR calc non Af Amer 33 (*)    GFR calc Af Amer 38 (*)    All other components within normal limits  POCT URINALYSIS DIP (DEVICE) - Abnormal; Notable for the following components:   Glucose, UA 500 (*)    All other components within normal limits    EKG None  Radiology No results  found.  Procedures Procedures (including critical care time)  Medications Ordered in UC Medications - No data to display  Initial Impression / Assessment and Plan / UC Course  I have reviewed the triage vital signs and the nursing notes.  Pertinent labs & imaging results that were available during my care of the patient were reviewed by me and considered in my medical decision making (see chart for details).     Symptoms consistent with musculoskeletal pain. Urine was negative for any hematuria, leukocytes or protein. Blood work revealed improved kidney function compared to last result We will go ahead and treat this with Voltaren gel She can rub this on the area as needed For continued or worsening symptoms you need to follow-up with her doctor Final Clinical Impressions(s) / UC Diagnoses   Final diagnoses:  Right flank pain     Discharge Instructions     I believe that your pain is muscle related. Try using the Voltaren gel on the area a few times a day We will check your kidney function here before you leave and I will call you with any problems.    ED Prescriptions    None  Controlled Substance Prescriptions [place] Controlled Substance Registry consulted? Not Applicable   Orvan July, NP 03/30/18 [phone]

## 2018-04-15 ENCOUNTER — Other Ambulatory Visit: Payer: Self-pay | Admitting: Family Medicine

## 2018-05-11 ENCOUNTER — Other Ambulatory Visit: Payer: Self-pay

## 2018-05-11 MED ORDER — GABAPENTIN 300 MG PO CAPS: 300.0000 mg | ORAL_CAPSULE | Freq: Three times a day (TID) | ORAL | 3 refills | Status: DC

## 2018-05-18 ENCOUNTER — Other Ambulatory Visit: Payer: Self-pay | Admitting: *Deleted

## 2018-05-18 MED ORDER — HYDRALAZINE HCL 25 MG PO TABS: 25.0000 mg | ORAL_TABLET | Freq: Two times a day (BID) | ORAL | 0 refills | Status: AC

## 2018-06-05 ENCOUNTER — Other Ambulatory Visit: Payer: Self-pay | Admitting: Family Medicine

## 2018-06-14 ENCOUNTER — Other Ambulatory Visit: Payer: Self-pay

## 2018-08-18 ENCOUNTER — Other Ambulatory Visit: Payer: Self-pay

## 2018-08-18 ENCOUNTER — Ambulatory Visit (INDEPENDENT_AMBULATORY_CARE_PROVIDER_SITE_OTHER): Payer: 59 | Admitting: Family Medicine

## 2018-08-18 VITALS — BP 160/100 | HR 93

## 2018-08-18 DIAGNOSIS — M79605 Pain in left leg: Secondary | ICD-10-CM

## 2018-08-18 DIAGNOSIS — G8929 Other chronic pain: Secondary | ICD-10-CM | POA: Diagnosis not present

## 2018-08-18 DIAGNOSIS — M5386 Other specified dorsopathies, lumbar region: Secondary | ICD-10-CM

## 2018-08-18 MED ORDER — PREDNISONE 20 MG PO TABS: 20.0000 mg | ORAL_TABLET | Freq: Every day | ORAL | 0 refills | Status: AC

## 2018-08-18 NOTE — Patient Instructions (Addendum)
It was great seeing you again today!  I gave you your MRI results from December.  We checked your blood flow in your legs and that looked good.  We can try a prednisone Dosepak to see if you get any relief from this.  Will be 20 mg for 7 days.  If this does not help your pain or decrease her inflammation let me know we top it on the phone.

## 2018-08-18 NOTE — Progress Notes (Signed)
   HPI 53 year old who presents for bilateral lower extremity pain.  Pain is worse on right side.  Patient has a history of stroke affecting the side.  In the past her pain has been chalked up to post-stroke neuralgias.  Patient states that her pain originates in her buttock and goes down the back of her leg.  Does not describe it as a burning sensation but is more of a dull achiness.  Per patient report she was totally normal after her stroke and then developed these pains months afterwards.  Upon reviewing her neurologist notes, there was no period of totally normal function.  Patient has tried a huge variety of medications treatments for this which have not not worked very well.  She has had epidural injection states that she does not want this again.  Bilateral ABIs were performed which were normal.  MRI lumbar spine shows mild degenerative disc disease L5/S1.  CC: Right leg pain  ROS:   Review of Systems See HPI for ROS.   CC, SH/smoking status, and VS noted  Objective: BP (!) 160/100   Pulse 93   LMP 09/09/2014 (Approximate) Comment: none since august  SpO2 34%  Gen: 53 year old African female, no acute distress CV: RRR, no murmur Resp: CTAB, no wheezes, non-labored Neuro: Alert and oriented, Speech clear, No gross deficits Bilateral lower extremity: No palpable tenderness to lumbar spine or bilateral lower extremity.  Notable gait abnormality with patient hunching chest forward with shuffling gait.  Skin warm and dry.  ABIs performed: Left and right lower extremity greater than 1.   Assessment and plan:  Sciatica associated with disorder of lumbar spine Patient with bulging L5-S1 disc.  Normal ABIs can rule out peripheral vascular disease.  Given the bilateral nature of her symptoms this potential disc problem is most likely etiology.  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  We will try her on prednisone 20 mg  daily for 7 days.  If this does not improve her pain any will likely refer to neurosurgeon.   Orders Placed This Encounter  Procedures  . POCT ABI Screening Pilot No Charge    This Order is for screening for the ABI Pilot.  It is a no charge exam.     Meds ordered this encounter  Medications  . predniSONE (DELTASONE) 20 MG tablet    Sig: Take 1 tablet (20 mg total) by mouth daily with breakfast.    Dispense:  7 tablet    Refill:  0     Guadalupe Dawn MD PGY-2 Family Medicine Resident  08/21/2018 4:45 PM

## 2018-08-21 ENCOUNTER — Encounter: Payer: Self-pay | Admitting: Family Medicine

## 2018-08-21 NOTE — Assessment & Plan Note (Signed)
Patient with bulging L5-S1 disc.  Normal ABIs can rule out peripheral vascular disease.  Given the bilateral nature of her symptoms this potential disc problem is most likely etiology.  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  We will try her on prednisone 20 mg daily for 7 days.  If this does not improve her pain any will likely refer to neurosurgeon.

## 2018-10-31 ENCOUNTER — Inpatient Hospital Stay (HOSPITAL_COMMUNITY): Payer: 59

## 2018-10-31 ENCOUNTER — Inpatient Hospital Stay (HOSPITAL_COMMUNITY)
Admission: EM | Admit: 2018-10-31 | Discharge: 2018-11-09 | DRG: 064 | Disposition: E | Payer: 59 | Attending: Neurology | Admitting: Neurology

## 2018-10-31 ENCOUNTER — Emergency Department (HOSPITAL_COMMUNITY): Payer: 59

## 2018-10-31 DIAGNOSIS — J449 Chronic obstructive pulmonary disease, unspecified: Secondary | ICD-10-CM | POA: Diagnosis present

## 2018-10-31 DIAGNOSIS — R34 Anuria and oliguria: Secondary | ICD-10-CM | POA: Diagnosis not present

## 2018-10-31 DIAGNOSIS — R402434 Glasgow coma scale score 3-8, 24 hours or more after hospital admission: Secondary | ICD-10-CM | POA: Diagnosis not present

## 2018-10-31 DIAGNOSIS — Z9981 Dependence on supplemental oxygen: Secondary | ICD-10-CM | POA: Diagnosis not present

## 2018-10-31 DIAGNOSIS — F1491 Cocaine use, unspecified, in remission: Secondary | ICD-10-CM

## 2018-10-31 DIAGNOSIS — I6389 Other cerebral infarction: Secondary | ICD-10-CM | POA: Diagnosis not present

## 2018-10-31 DIAGNOSIS — R32 Unspecified urinary incontinence: Secondary | ICD-10-CM | POA: Diagnosis present

## 2018-10-31 DIAGNOSIS — I69391 Dysphagia following cerebral infarction: Secondary | ICD-10-CM

## 2018-10-31 DIAGNOSIS — Z7952 Long term (current) use of systemic steroids: Secondary | ICD-10-CM

## 2018-10-31 DIAGNOSIS — G911 Obstructive hydrocephalus: Secondary | ICD-10-CM | POA: Diagnosis not present

## 2018-10-31 DIAGNOSIS — I252 Old myocardial infarction: Secondary | ICD-10-CM | POA: Diagnosis not present

## 2018-10-31 DIAGNOSIS — G9340 Encephalopathy, unspecified: Secondary | ICD-10-CM

## 2018-10-31 DIAGNOSIS — N179 Acute kidney failure, unspecified: Secondary | ICD-10-CM | POA: Diagnosis present

## 2018-10-31 DIAGNOSIS — Z720 Tobacco use: Secondary | ICD-10-CM | POA: Diagnosis present

## 2018-10-31 DIAGNOSIS — I613 Nontraumatic intracerebral hemorrhage in brain stem: Principal | ICD-10-CM

## 2018-10-31 DIAGNOSIS — G9382 Brain death: Secondary | ICD-10-CM | POA: Diagnosis not present

## 2018-10-31 DIAGNOSIS — J4540 Moderate persistent asthma, uncomplicated: Secondary | ICD-10-CM | POA: Diagnosis present

## 2018-10-31 DIAGNOSIS — E663 Overweight: Secondary | ICD-10-CM | POA: Diagnosis present

## 2018-10-31 DIAGNOSIS — G936 Cerebral edema: Secondary | ICD-10-CM

## 2018-10-31 DIAGNOSIS — M25562 Pain in left knee: Secondary | ICD-10-CM | POA: Diagnosis present

## 2018-10-31 DIAGNOSIS — I619 Nontraumatic intracerebral hemorrhage, unspecified: Secondary | ICD-10-CM | POA: Diagnosis present

## 2018-10-31 DIAGNOSIS — N184 Chronic kidney disease, stage 4 (severe): Secondary | ICD-10-CM | POA: Diagnosis present

## 2018-10-31 DIAGNOSIS — Z825 Family history of asthma and other chronic lower respiratory diseases: Secondary | ICD-10-CM

## 2018-10-31 DIAGNOSIS — Z8673 Personal history of transient ischemic attack (TIA), and cerebral infarction without residual deficits: Secondary | ICD-10-CM

## 2018-10-31 DIAGNOSIS — F1120 Opioid dependence, uncomplicated: Secondary | ICD-10-CM | POA: Diagnosis present

## 2018-10-31 DIAGNOSIS — I615 Nontraumatic intracerebral hemorrhage, intraventricular: Secondary | ICD-10-CM | POA: Diagnosis present

## 2018-10-31 DIAGNOSIS — M5430 Sciatica, unspecified side: Secondary | ICD-10-CM | POA: Diagnosis present

## 2018-10-31 DIAGNOSIS — F17210 Nicotine dependence, cigarettes, uncomplicated: Secondary | ICD-10-CM | POA: Diagnosis present

## 2018-10-31 DIAGNOSIS — E782 Mixed hyperlipidemia: Secondary | ICD-10-CM | POA: Diagnosis present

## 2018-10-31 DIAGNOSIS — G8929 Other chronic pain: Secondary | ICD-10-CM | POA: Diagnosis present

## 2018-10-31 DIAGNOSIS — R739 Hyperglycemia, unspecified: Secondary | ICD-10-CM

## 2018-10-31 DIAGNOSIS — J9600 Acute respiratory failure, unspecified whether with hypoxia or hypercapnia: Secondary | ICD-10-CM

## 2018-10-31 DIAGNOSIS — I1310 Hypertensive heart and chronic kidney disease without heart failure, with stage 1 through stage 4 chronic kidney disease, or unspecified chronic kidney disease: Secondary | ICD-10-CM | POA: Diagnosis present

## 2018-10-31 DIAGNOSIS — Z6827 Body mass index (BMI) 27.0-27.9, adult: Secondary | ICD-10-CM

## 2018-10-31 DIAGNOSIS — E870 Hyperosmolality and hypernatremia: Secondary | ICD-10-CM | POA: Diagnosis not present

## 2018-10-31 DIAGNOSIS — E232 Diabetes insipidus: Secondary | ICD-10-CM | POA: Diagnosis not present

## 2018-10-31 DIAGNOSIS — G935 Compression of brain: Secondary | ICD-10-CM

## 2018-10-31 DIAGNOSIS — D72829 Elevated white blood cell count, unspecified: Secondary | ICD-10-CM

## 2018-10-31 DIAGNOSIS — H5704 Mydriasis: Secondary | ICD-10-CM | POA: Diagnosis present

## 2018-10-31 DIAGNOSIS — E876 Hypokalemia: Secondary | ICD-10-CM | POA: Diagnosis not present

## 2018-10-31 DIAGNOSIS — Z515 Encounter for palliative care: Secondary | ICD-10-CM | POA: Diagnosis not present

## 2018-10-31 DIAGNOSIS — Z8249 Family history of ischemic heart disease and other diseases of the circulatory system: Secondary | ICD-10-CM

## 2018-10-31 DIAGNOSIS — Z87898 Personal history of other specified conditions: Secondary | ICD-10-CM

## 2018-10-31 DIAGNOSIS — Z20828 Contact with and (suspected) exposure to other viral communicable diseases: Secondary | ICD-10-CM | POA: Diagnosis present

## 2018-10-31 DIAGNOSIS — J9601 Acute respiratory failure with hypoxia: Secondary | ICD-10-CM | POA: Diagnosis not present

## 2018-10-31 DIAGNOSIS — N189 Chronic kidney disease, unspecified: Secondary | ICD-10-CM | POA: Diagnosis present

## 2018-10-31 DIAGNOSIS — Z66 Do not resuscitate: Secondary | ICD-10-CM | POA: Diagnosis not present

## 2018-10-31 DIAGNOSIS — I161 Hypertensive emergency: Secondary | ICD-10-CM | POA: Diagnosis present

## 2018-10-31 DIAGNOSIS — J329 Chronic sinusitis, unspecified: Secondary | ICD-10-CM | POA: Diagnosis present

## 2018-10-31 DIAGNOSIS — Q359 Cleft palate, unspecified: Secondary | ICD-10-CM

## 2018-10-31 DIAGNOSIS — I10 Essential (primary) hypertension: Secondary | ICD-10-CM | POA: Diagnosis present

## 2018-10-31 DIAGNOSIS — I2510 Atherosclerotic heart disease of native coronary artery without angina pectoris: Secondary | ICD-10-CM | POA: Diagnosis present

## 2018-10-31 DIAGNOSIS — R578 Other shock: Secondary | ICD-10-CM | POA: Diagnosis not present

## 2018-10-31 DIAGNOSIS — Z79899 Other long term (current) drug therapy: Secondary | ICD-10-CM

## 2018-10-31 DIAGNOSIS — R4020 Unspecified coma: Secondary | ICD-10-CM | POA: Diagnosis present

## 2018-10-31 DIAGNOSIS — Z9861 Coronary angioplasty status: Secondary | ICD-10-CM

## 2018-10-31 DIAGNOSIS — Z791 Long term (current) use of non-steroidal anti-inflammatories (NSAID): Secondary | ICD-10-CM

## 2018-10-31 DIAGNOSIS — Z7982 Long term (current) use of aspirin: Secondary | ICD-10-CM

## 2018-10-31 DIAGNOSIS — R680 Hypothermia, not associated with low environmental temperature: Secondary | ICD-10-CM | POA: Diagnosis not present

## 2018-10-31 LAB — POCT I-STAT 7, (LYTES, BLD GAS, ICA,H+H)
Acid-Base Excess: 7 mmol/L — ABNORMAL HIGH (ref 0.0–2.0)
Acid-Base Excess: 6 mmol/L — ABNORMAL HIGH (ref 0.0–2.0)
Bicarbonate: 34.3 mmol/L — ABNORMAL HIGH (ref 20.0–28.0)
Bicarbonate: 32.9 mmol/L — ABNORMAL HIGH (ref 20.0–28.0)
Calcium, Ion: 1.13 mmol/L — ABNORMAL LOW (ref 1.15–1.40)
Calcium, Ion: 1.12 mmol/L — ABNORMAL LOW (ref 1.15–1.40)
HCT: 45 % (ref 36.0–46.0)
HCT: 46 % (ref 36.0–46.0)
Hemoglobin: 15.3 g/dL — ABNORMAL HIGH (ref 12.0–15.0)
Hemoglobin: 15.6 g/dL — ABNORMAL HIGH (ref 12.0–15.0)
O2 Saturation: 100 %
O2 Saturation: 99 %
Potassium: 3 mmol/L — ABNORMAL LOW (ref 3.5–5.1)
Potassium: 3 mmol/L — ABNORMAL LOW (ref 3.5–5.1)
Sodium: 138 mmol/L (ref 135–145)
Sodium: 137 mmol/L (ref 135–145)
TCO2: 36 mmol/L — ABNORMAL HIGH (ref 22–32)
TCO2: 35 mmol/L — ABNORMAL HIGH (ref 22–32)
pCO2 arterial: 57.6 mmHg — ABNORMAL HIGH (ref 32.0–48.0)
pCO2 arterial: 54.9 mmHg — ABNORMAL HIGH (ref 32.0–48.0)
pH, Arterial: 7.383 (ref 7.350–7.450)
pH, Arterial: 7.386 (ref 7.350–7.450)
pO2, Arterial: 499 mmHg — ABNORMAL HIGH (ref 83.0–108.0)
pO2, Arterial: 122 mmHg — ABNORMAL HIGH (ref 83.0–108.0)

## 2018-10-31 LAB — SODIUM
Sodium: 137 mmol/L (ref 135–145)
Sodium: 142 mmol/L (ref 135–145)

## 2018-10-31 LAB — I-STAT CHEM 8, ED
BUN: 39 mg/dL — ABNORMAL HIGH (ref 6–20)
Calcium, Ion: 1.06 mmol/L — ABNORMAL LOW (ref 1.15–1.40)
Chloride: 100 mmol/L (ref 98–111)
Creatinine, Ser: 2.4 mg/dL — ABNORMAL HIGH (ref 0.44–1.00)
Glucose, Bld: 196 mg/dL — ABNORMAL HIGH (ref 70–99)
HCT: 45 % (ref 36.0–46.0)
Hemoglobin: 15.3 g/dL — ABNORMAL HIGH (ref 12.0–15.0)
Potassium: 3 mmol/L — ABNORMAL LOW (ref 3.5–5.1)
Sodium: 141 mmol/L (ref 135–145)
TCO2: 27 mmol/L (ref 22–32)

## 2018-10-31 LAB — COMPREHENSIVE METABOLIC PANEL
ALT: 19 U/L (ref 0–44)
AST: 23 U/L (ref 15–41)
Albumin: 3.3 g/dL — ABNORMAL LOW (ref 3.5–5.0)
Alkaline Phosphatase: 87 U/L (ref 38–126)
Anion gap: 13 (ref 5–15)
BUN: 38 mg/dL — ABNORMAL HIGH (ref 6–20)
CO2: 24 mmol/L (ref 22–32)
Calcium: 8.6 mg/dL — ABNORMAL LOW (ref 8.9–10.3)
Chloride: 102 mmol/L (ref 98–111)
Creatinine, Ser: 2.51 mg/dL — ABNORMAL HIGH (ref 0.44–1.00)
GFR calc Af Amer: 24 mL/min — ABNORMAL LOW (ref >60)
GFR calc non Af Amer: 21 mL/min — ABNORMAL LOW (ref >60)
Glucose, Bld: 203 mg/dL — ABNORMAL HIGH (ref 70–99)
Potassium: 3.1 mmol/L — ABNORMAL LOW (ref 3.5–5.1)
Sodium: 139 mmol/L (ref 135–145)
Total Bilirubin: 0.3 mg/dL (ref 0.3–1.2)
Total Protein: 6.9 g/dL (ref 6.5–8.1)

## 2018-10-31 LAB — LIPID PANEL
Cholesterol: 176 mg/dL (ref 0–200)
HDL: 48 mg/dL (ref >40)
LDL Cholesterol: 100 mg/dL — ABNORMAL HIGH (ref 0–99)
Total CHOL/HDL Ratio: 3.7 RATIO
Triglycerides: 138 mg/dL (ref <150)
VLDL: 28 mg/dL (ref 0–40)

## 2018-10-31 LAB — APTT: aPTT: 25 seconds (ref 24–36)

## 2018-10-31 LAB — DIFFERENTIAL
Abs Immature Granulocytes: 0.01 10*3/uL (ref 0.00–0.07)
Basophils Absolute: 0.1 10*3/uL (ref 0.0–0.1)
Basophils Relative: 1 %
Eosinophils Absolute: 0.2 10*3/uL (ref 0.0–0.5)
Eosinophils Relative: 3 %
Immature Granulocytes: 0 %
Lymphocytes Relative: 45 %
Lymphs Abs: 3.7 10*3/uL (ref 0.7–4.0)
Monocytes Absolute: 0.8 10*3/uL (ref 0.1–1.0)
Monocytes Relative: 10 %
Neutro Abs: 3.4 10*3/uL (ref 1.7–7.7)
Neutrophils Relative %: 41 %

## 2018-10-31 LAB — CBC
HCT: 45.2 % (ref 36.0–46.0)
Hemoglobin: 14.2 g/dL (ref 12.0–15.0)
MCH: 28.3 pg (ref 26.0–34.0)
MCHC: 31.4 g/dL (ref 30.0–36.0)
MCV: 90.2 fL (ref 80.0–100.0)
Platelets: 317 10*3/uL (ref 150–400)
RBC: 5.01 MIL/uL (ref 3.87–5.11)
RDW: 14.5 % (ref 11.5–15.5)
WBC: 8.2 10*3/uL (ref 4.0–10.5)
nRBC: 0 % (ref 0.0–0.2)

## 2018-10-31 LAB — I-STAT BETA HCG BLOOD, ED (MC, WL, AP ONLY): I-stat hCG, quantitative: <5 m[IU]/mL (ref <5)

## 2018-10-31 LAB — CBG MONITORING, ED: Glucose-Capillary: 198 mg/dL — ABNORMAL HIGH (ref 70–99)

## 2018-10-31 LAB — PROTIME-INR
INR: 1 (ref 0.8–1.2)
Prothrombin Time: 13 seconds (ref 11.4–15.2)

## 2018-10-31 LAB — SARS CORONAVIRUS 2 BY RT PCR (HOSPITAL ORDER, PERFORMED IN ~~LOC~~ HOSPITAL LAB): SARS Coronavirus 2: NEGATIVE

## 2018-10-31 LAB — MRSA PCR SCREENING: MRSA by PCR: POSITIVE — AB

## 2018-10-31 LAB — ETHANOL: Alcohol, Ethyl (B): <10 mg/dL (ref <10)

## 2018-10-31 MED ORDER — MUPIROCIN 2 % EX OINT
1.0000 "application " | TOPICAL_OINTMENT | Freq: Two times a day (BID) | CUTANEOUS | Status: DC
  Administered 2018-10-31 – 2018-11-02 (×4): 1 via NASAL
  Filled 2018-10-31: qty 22

## 2018-10-31 MED ORDER — ETOMIDATE 2 MG/ML IV SOLN
INTRAVENOUS | Status: AC | PRN
  Administered 2018-10-31: 20 mg via INTRAVENOUS

## 2018-10-31 MED ORDER — SENNOSIDES-DOCUSATE SODIUM 8.6-50 MG PO TABS
1.0000 | ORAL_TABLET | Freq: Two times a day (BID) | ORAL | Status: DC
  Administered 2018-10-31: 21:00:00 1 via ORAL
  Filled 2018-10-31: qty 1

## 2018-10-31 MED ORDER — SODIUM CHLORIDE 3 % IV SOLN
INTRAVENOUS | Status: DC
  Administered 2018-10-31 – 2018-11-01 (×3): 50 mL/h via INTRAVENOUS
  Filled 2018-10-31 (×3): qty 500

## 2018-10-31 MED ORDER — ACETAMINOPHEN 325 MG PO TABS: 650.0000 mg | ORAL_TABLET | ORAL | Status: DC | PRN

## 2018-10-31 MED ORDER — AMLODIPINE BESYLATE 10 MG PO TABS
10.0000 mg | ORAL_TABLET | Freq: Every day | ORAL | Status: DC
  Administered 2018-10-31 (×2): 10 mg via ORAL
  Filled 2018-10-31 (×2): qty 1

## 2018-10-31 MED ORDER — NALOXONE HCL 0.4 MG/ML IJ SOLN
INTRAMUSCULAR | Status: AC
  Filled 2018-10-31: qty 1

## 2018-10-31 MED ORDER — ORAL CARE MOUTH RINSE
15.0000 mL | OROMUCOSAL | Status: DC
  Administered 2018-10-31 – 2018-11-02 (×19): 15 mL via OROMUCOSAL

## 2018-10-31 MED ORDER — ACETAMINOPHEN 650 MG RE SUPP: 650.0000 mg | RECTAL | Status: DC | PRN

## 2018-10-31 MED ORDER — CHLORHEXIDINE GLUCONATE 0.12% ORAL RINSE (MEDLINE KIT)
15.0000 mL | Freq: Two times a day (BID) | OROMUCOSAL | Status: DC
  Administered 2018-10-31 – 2018-11-02 (×4): 15 mL via OROMUCOSAL

## 2018-10-31 MED ORDER — PREDNISONE 20 MG PO TABS
20.0000 mg | ORAL_TABLET | Freq: Every day | ORAL | Status: DC
  Administered 2018-11-01: 20 mg
  Filled 2018-10-31: qty 1

## 2018-10-31 MED ORDER — GABAPENTIN 250 MG/5ML PO SOLN
300.0000 mg | Freq: Three times a day (TID) | ORAL | Status: DC
  Administered 2018-10-31: 300 mg
  Filled 2018-10-31 (×3): qty 6

## 2018-10-31 MED ORDER — CLEVIDIPINE BUTYRATE 0.5 MG/ML IV EMUL
0.0000 mg/h | INTRAVENOUS | Status: DC
  Administered 2018-10-31: 14:00:00 18 mg/h via INTRAVENOUS
  Administered 2018-10-31: 09:00:00 1 mg/h via INTRAVENOUS
  Filled 2018-10-31 (×8): qty 50

## 2018-10-31 MED ORDER — HYDRALAZINE HCL 20 MG/ML IJ SOLN
10.0000 mg | INTRAMUSCULAR | Status: DC | PRN
  Administered 2018-10-31: 10 mg via INTRAVENOUS
  Filled 2018-10-31: qty 1

## 2018-10-31 MED ORDER — CHLORHEXIDINE GLUCONATE CLOTH 2 % EX PADS
6.0000 | MEDICATED_PAD | Freq: Every day | CUTANEOUS | Status: DC
  Administered 2018-10-31 – 2018-11-02 (×2): 6 via TOPICAL

## 2018-10-31 MED ORDER — ROCURONIUM BROMIDE 50 MG/5ML IV SOLN
INTRAVENOUS | Status: AC | PRN
  Administered 2018-10-31: 100 mg via INTRAVENOUS

## 2018-10-31 MED ORDER — FENTANYL 2500MCG IN NS 250ML (10MCG/ML) PREMIX INFUSION
0.0000 ug/h | INTRAVENOUS | Status: DC
  Administered 2018-10-31: 25 ug/h via INTRAVENOUS
  Filled 2018-10-31: qty 250

## 2018-10-31 MED ORDER — LABETALOL HCL 5 MG/ML IV SOLN
10.0000 mg | INTRAVENOUS | Status: DC | PRN
  Administered 2018-10-31 – 2018-11-01 (×2): 10 mg via INTRAVENOUS
  Filled 2018-10-31: qty 4

## 2018-10-31 MED ORDER — PANTOPRAZOLE SODIUM 40 MG IV SOLR
40.0000 mg | Freq: Every day | INTRAVENOUS | Status: DC
  Administered 2018-10-31: 40 mg via INTRAVENOUS
  Filled 2018-10-31: qty 40

## 2018-10-31 MED ORDER — CLEVIDIPINE BUTYRATE 0.5 MG/ML IV EMUL
0.0000 mg/h | INTRAVENOUS | Status: DC
  Administered 2018-10-31 – 2018-11-01 (×4): 32 mg/h via INTRAVENOUS
  Administered 2018-11-01: 25 mg/h via INTRAVENOUS
  Filled 2018-10-31 (×3): qty 50

## 2018-10-31 MED ORDER — ACETAMINOPHEN 160 MG/5ML PO SOLN
650.0000 mg | ORAL | Status: DC | PRN
  Administered 2018-11-01: 650 mg
  Filled 2018-10-31: qty 20.3

## 2018-10-31 MED ORDER — NALOXONE HCL 0.4 MG/ML IJ SOLN: 0.4000 mg | Freq: Once | INTRAMUSCULAR | Status: DC

## 2018-10-31 MED ORDER — LABETALOL HCL 5 MG/ML IV SOLN
20.0000 mg | Freq: Once | INTRAVENOUS | Status: AC
  Administered 2018-10-31: 09:00:00 20 mg via INTRAVENOUS
  Filled 2018-10-31: qty 4

## 2018-10-31 MED ORDER — STROKE: EARLY STAGES OF RECOVERY BOOK: Freq: Once | Status: DC

## 2018-10-31 NOTE — Code Documentation (Signed)
Mouth suctioned via yaunker while intubation is being prepped.

## 2018-10-31 NOTE — Progress Notes (Signed)
Neuro MD Rory Percy paged about patient being above BP goals even with cleviprex running at max rate. New orders received to increase maximum rate of cleviprex to 64 mL/hour (32 mg/hour). Will continue to monitor.

## 2018-10-31 NOTE — Progress Notes (Signed)
Patient immediatly vomited clear emesis after getting 2200 meds. Dr. Rory Percy contacted. Dr. Rory Percy made aware of patient's blood pressure being over 140, despite cleviprex running at max, and labetalol and hydralazine being given. Dr. Rory Percy said to give another dose of amlodipine, but to hold all the others. Will continue to monitor.

## 2018-10-31 NOTE — Progress Notes (Signed)
Responded to page to support patient and family.  Spoke with patient only son and patient fiance'.  Per nurse and son the patient father had visited with patient at bedside and has gone to get patients mother. Per charge nurse and attending nursing patient family is being allowed to  Visit .  Per staff patient  may not survive.  Provided emotional and spiritual support, ministry of presence and hospitality to family.  Prayed for patient.  Chaplain available as needed.Oswaldo Milian, Jiyah Torpey, Chaplain, [facility], Pager [phone] l

## 2018-10-31 NOTE — Code Documentation (Signed)
53yo female arriving to [facility] via [facility] at 0854. Patient from home where she was LKW at 0500. She drove herself to the methadone clinic and returned home. Her family heard her scream and found her unresponsive on the floor. EMS called and activated a code stroke for altered mental status. Stroke RN met patient in CT. CT completed showing acute pontine hemorrhage with extension into the perimesencephalic cisterns on the right and into the fourth ventricle. Patient obtunded and unable to manage her secretions. Patient transported back to the ED. Patient repositioned with HOB elevated and provided oral suctioning. NIHSS 30, see documentation for details and code stroke times. Patient with right pupil 3mm, irregular and sluggish and left pupil 2mm, round and sluggish. EDP to the bedside for intubation. Patient administered Labetalol 10mg IVP followed by Cleviprex gtt for goal SBP < 140. See documentation for vital signs and titrations. Patient transported to CT for CTA, however, Cr 2.4. Dr. Lindzen notified and CTA discontinued. Patient transported back to the ED. Order for MRA. MRI called, however, MRI staff needs to speak with patient's family to screen patient as no abdominal imaging is available. Patient reassessed and noted that right pupil is now 6mm, round and nonreactive and left pupil 2mm, round and nonreactive. Dr. Lindzen made aware of exam and order for STAT CT placed. Patient transported to CT for CT showing progression of intracranial hemorrhage extending now cephalad along the right cortical spinal tracts to the level of the corona radiata. Increasing intracranial mass effect. Early midline shift is present at the level of the thalami. There is further effacement of the sulci. Dilating lateral ventricles consistent with developing hydrocephalus. Dr. Lindzen in CT while CT completed. MRA imaging not needed at this time. Patient transported to the ED. NS consulted. No acute intervention at this time. Bedside  handoff with ED RN Paige.  

## 2018-10-31 NOTE — ED Notes (Signed)
Pt began to convulse & have drainage from her mouth again while family at bedside. She was suctioned & cleaned, EDP made aware & Neurology to be paged.

## 2018-10-31 NOTE — Progress Notes (Addendum)
Returned to re-examine patient. She continues to hav a blown pupil on the right and continues to be unresponsive. Projectile vomiting is intermittently noted.   Discussed case with the patient's husband, who has decided that he would like full level of care for his wife until rest of family is able to arrive to see her at the bedside. Starting hypertonic saline. HOB to 30 degrees.    20 minutes of additional critical care time.   Electronically signed: Dr. Kerney Elbe

## 2018-10-31 NOTE — ED Triage Notes (Signed)
Arrived to ED via [facility] when she went to methadone clinic at 0500, she drove herself home & family heard her scream floor help then found her on the floor, 911 was called, EMS responded that she was responsive when they arrived. Upon arrival to ED pt was only responsive to pain in the legs. C-collar in place, airway patent upon arrival.

## 2018-10-31 NOTE — Consult Note (Addendum)
Chief Complaint   Chief Complaint  Patient presents with  . Code Stroke    HPI   Consult requested by: Dr Cheral Marker Reason for consult: pontine hemorrhage  HPI: Karla Nelson is a 53 y.o. female with multiple comorbidities who presented to ED as a code stroke. Patient is currently intubated. History obtained via chart review and discussion with Neuro personnel. Patient reportedly went to methadone clinic at 0500 today, and later that am family heard her scream and found her on the floor. She was responsive upon EMS arrival, but decompensated and only responsive to pain in LE when arrived to ED. Intubated upon arrival, approx 0915. 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 Initially upon arrival, was hypertensive, >200/100, pupils sluggishly reactive. Right pupil blown shortly after. NSY consultation requested. ASA 325mg  daily listed in med list. Currently on Cleviprex for BP.  Patient Active Problem List   Diagnosis Date Noted  . ICH (intracerebral hemorrhage) ([place]) 10/21/2018  . Sciatica associated with disorder of lumbar spine 01/14/2018  . Old tear of meniscus of left knee 01/14/2018  . Upper back pain 01/14/2018  . Chronic pain of left knee 08/01/2017  . 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 Healthcare maintenance 04/25/2017  . Chronic pain of left lower extremity 04/25/2017  . History of stroke 06/03/2016  . Smoking 12/30/2014  . Chronic obstructive pulmonary disease ([place]) 12/30/2014  . Stroke ([place]) 09/01/2014  . Right sided weakness   . Smoker 03/15/2014  . History of cocaine use 03/15/2014  . Asthma, moderate persistent 07/26/2013  . 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 Acute sinusitis 04/11/2013  . NSTEMI (non-ST elevated myocardial infarction) ([place]) 06/19/2012  . Acute-on-chronic kidney injury ([place]) 06/19/2012  . COPD exacerbation ([place]) 12/05/2011  . COPD (chronic obstructive pulmonary  disease) ([place]) 07/12/2011  . CAD (coronary artery disease) 01/13/2011  . Tobacco abuse 01/13/2011  . Mixed hyperlipidemia 10/13/2010  . ELECTROCARDIOGRAM, ABNORMAL 02/13/2010  . CLEFT PALATE 02/12/2010  . Essential hypertension 03/08/2007  . DYSPNEA ON EXERTION 03/08/2007    PMH: Past Medical History:  Diagnosis Date  . Asthma   . CAD (coronary artery disease)    NSTEMI with cath in August 2012 with nonobstructive disease, 50% ostial D1 and 70% distal LCX and PL disease. Normal EF. Has diastolic dysfunction with elevated EDP  . Cleft palate    special denture covers defect like a C, sleeps with device in place  . COPD (chronic obstructive pulmonary disease) (H[facility])    o2 and bedtime   . DYSPNEA ON EXERTION 03/08/2007  . ELECTROCARDIOGRAM, ABNORMAL 02/13/2010  . History of oxygen administration    bedtime  . HTN (hypertension)    Has normal renal arteries.  . Hyperlipidemia   . NSTEMI (non-ST elevated myocardial infarction) ([place]) 09-25-2010  . Renal disorder   . Stroke [facility])    july 2016, Dr Donnella Sham  . Tobacco abuse disorder     PSH: Past Surgical History:  Procedure Laterality Date  . CARDIAC CATHETERIZATION  Aug 2012   Moderate nonobstructive multivessel CAD with an EF of 70 to 75%  . CESAREAN SECTION    . CORONARY ANGIOPLASTY     no further intervention last cardiac visit greater than a year    (Not in a hospital admission)   SH: Social History   Tobacco Use  . Smoking status: Current Every Day Smoker    Packs/day: 0.50    Years: 31.00    Pack  years: 15.50    Types: Cigarettes  . Smokeless tobacco: Never Used  Substance Use Topics  . Alcohol use: No  . Drug use: No    Comment: former drug use    MEDS: Prior to Admission medications   Medication Sig Start Date End Date Taking? Authorizing Provider  amLODipine (NORVASC) 10 MG tablet Take 1 tablet (10 mg total) by mouth at bedtime. 07/13/17   Mayo, Pete Pelt, MD  aspirin 325 MG tablet Take 1 tablet (325 mg  total) by mouth daily. 09/02/14   Delfina Redwood, MD  atorvastatin (LIPITOR) 20 MG tablet Take 1 tablet (20 mg total) by mouth daily at 6 PM. 09/02/14   Delfina Redwood, MD  benzonatate (TESSALON) 100 MG capsule Take 1 capsule (100 mg total) by mouth every 8 (eight) hours. 01/22/18   Loura Halt A, NP  Cholecalciferol (VITAMIN D PO) Take 1,000 Units by mouth.    [provider]  diclofenac sodium (VOLTAREN) 1 % GEL Apply 4 g topically 4 (four) times daily. 03/29/18   Bast, Tressia Miners A, NP  gabapentin (NEURONTIN) 300 MG capsule TAKE 1 CAPSULE BY MOUTH THREE TIMES A DAY Patient taking differently: Take 300 mg by mouth 3 (three) times daily.  06/05/18   Guadalupe Dawn, MD  hydrALAZINE (APRESOLINE) 25 MG tablet Take 1 tablet (25 mg total) by mouth 2 (two) times daily. 05/18/18   Guadalupe Dawn, MD  labetalol (NORMODYNE) 300 MG tablet TAKE 1 TABLET (300 MG TOTAL) BY MOUTH 2 (TWO) TIMES DAILY. 04/17/18   Guadalupe Dawn, MD  METHADONE HCL PO Take 8 mg by mouth daily.     [provider]  Multiple Vitamin (MULTIVITAMIN WITH MINERALS) TABS tablet Take 1 tablet by mouth daily.    [provider]  predniSONE (DELTASONE) 20 MG tablet Take 1 tablet (20 mg total) by mouth daily with breakfast. 08/18/18   Guadalupe Dawn, MD  promethazine-dextromethorphan (PROMETHAZINE-DM) 6.25-15 MG/5ML syrup Take 5 mLs by mouth 4 (four) times daily as needed for cough. 01/22/18   Orvan July, NP  UNABLE TO FIND Alpha lipid acid take twice a day    [provider]    ALLERGY: No Known Allergies  Social History   Tobacco Use  . Smoking status: Current Every Day Smoker    Packs/day: 0.50    Years: 31.00    Pack years: 15.50    Types: Cigarettes  . Smokeless tobacco: Never Used  Substance Use Topics  . Alcohol use: No     Family History  Problem Relation Age of Onset  . Asthma Mother   . Heart disease Mother   . Hypertension Mother   . Asthma Brother   . Diabetes Other   .  Hypertension Other      ROS   ROS unable to obtain, AMS  Exam   Vitals:   10/16/2018 0921 11/05/2018 0936  BP: (!) 270/170   Pulse: (!) 133 96  Resp: (!) 26 16  SpO2: 100% 100%   Intubated Collar in place No sedatives running GCS 3 Right pupil 22mm, nonreactive Left pupil 31mm, nonreactive Neg corneal, gag, cough No response to central pain  Results - Imaging/Labs   Results for orders placed or performed during the hospital encounter of 11/06/2018 (from the past 48 hour(s))  Protime-INR     Status: None   Collection Time: 10/20/2018  8:55 AM  Result Value Ref Range   Prothrombin Time 13.0 11.4 - 15.2 seconds   INR 1.0  0.8 - 1.2    Comment: (NOTE) INR goal varies based on device and disease states. Performed at [facility], [place] [address] [place], [place] [address]   APTT     Status: None   Collection Time: 10/23/2018  8:55 AM  Result Value Ref Range   aPTT 25 24 - 36 seconds    Comment: Performed at [facility] [address] [place] [address]  CBC     Status: None   Collection Time: 10/13/2018  8:55 AM  Result Value Ref Range   WBC 8.2 4.0 - 10.5 K/uL   RBC 5.01 3.87 - 5.11 MIL/uL   Hemoglobin 14.2 12.0 - 15.0 g/dL   HCT 45.2 36.0 - 46.0 %   MCV 90.2 80.0 - 100.0 fL   MCH 28.3 26.0 - 34.0 pg   MCHC 31.4 30.0 - 36.0 g/dL   RDW 14.5 11.5 - 15.5 %   Platelets 317 150 - 400 K/uL   nRBC 0.0 0.0 - 0.2 %    Comment: Performed at [facility], [place] [address] [place], [place] [address]  Differential     Status: None   Collection Time: 10/12/2018  8:55 AM  Result Value Ref Range   Neutrophils Relative % 41 %   Neutro Abs 3.4 1.7 - 7.7 K/uL   Lymphocytes Relative 45 %   Lymphs Abs 3.7 0.7 - 4.0 K/uL   Monocytes Relative 10 %   Monocytes Absolute 0.8 0.1 - 1.0 K/uL   Eosinophils Relative 3 %   Eosinophils Absolute 0.2 0.0 - 0.5 K/uL   Basophils Relative 1 %   Basophils Absolute 0.1 0.0 - 0.1 K/uL   Immature Granulocytes 0 %   Abs Immature  Granulocytes 0.01 0.00 - 0.07 K/uL    Comment: Performed at [facility] [address], [place], [place] [address]  Comprehensive metabolic panel     Status: Abnormal   Collection Time: 10/16/2018  8:55 AM  Result Value Ref Range   Sodium 139 135 - 145 mmol/L   Potassium 3.1 (L) 3.5 - 5.1 mmol/L   Chloride 102 98 - 111 mmol/L   CO2 24 22 - 32 mmol/L   Glucose, Bld 203 (H) 70 - 99 mg/dL   BUN 38 (H) 6 - 20 mg/dL   Creatinine, Ser 2.51 (H) 0.44 - 1.00 mg/dL   Calcium 8.6 (L) 8.9 - 10.3 mg/dL   Total Protein 6.9 6.5 - 8.1 g/dL   Albumin 3.3 (L) 3.5 - 5.0 g/dL   AST 23 15 - 41 U/L   ALT 19 0 - 44 U/L   Alkaline Phosphatase 87 38 - 126 U/L   Total Bilirubin 0.3 0.3 - 1.2 mg/dL   GFR calc non Af Amer 21 (L) >60 mL/min   GFR calc Af Amer 24 (L) >60 mL/min   Anion gap 13 5 - 15    Comment: Performed at [facility], [place] [address] [place], [place] [address]  CBG monitoring, ED     Status: Abnormal   Collection Time: 10/22/2018  8:57 AM  Result Value Ref Range   Glucose-Capillary 198 (H) 70 - 99 mg/dL  I-Stat beta hCG blood, ED     Status: None   Collection Time: 10/27/2018  9:02 AM  Result Value Ref Range   I-stat hCG, quantitative <5.0 <5 mIU/mL   Comment 3            Comment:  GEST. AGE      CONC.  (mIU/mL)   <=1 WEEK        5 - 50     2 WEEKS       50 - 500     3 WEEKS       100 - 10,000     4 WEEKS     1,000 - 30,000        FEMALE AND NON-PREGNANT FEMALE:     LESS THAN 5 mIU/mL   I-stat chem 8, ED     Status: Abnormal   Collection Time: 10/11/2018  9:04 AM  Result Value Ref Range   Sodium 141 135 - 145 mmol/L   Potassium 3.0 (L) 3.5 - 5.1 mmol/L   Chloride 100 98 - 111 mmol/L   BUN 39 (H) 6 - 20 mg/dL   Creatinine, Ser 2.40 (H) 0.44 - 1.00 mg/dL   Glucose, Bld 196 (H) 70 - 99 mg/dL   Calcium, Ion 1.06 (L) 1.15 - 1.40 mmol/L   TCO2 27 22 - 32 mmol/L   Hemoglobin 15.3 (H) 12.0 - 15.0 g/dL   HCT 45.0 36.0 - 46.0 %  Lipid panel     Status: Abnormal   Collection  Time: 11/07/2018  9:10 AM  Result Value Ref Range   Cholesterol 176 0 - 200 mg/dL   Triglycerides 138 <150 mg/dL   HDL 48 >40 mg/dL   Total CHOL/HDL Ratio 3.7 RATIO   VLDL 28 0 - 40 mg/dL   LDL Cholesterol 100 (H) 0 - 99 mg/dL    Comment:        Total Cholesterol/HDL:CHD Risk Coronary Heart Disease Risk Table                     Men   Women  1/2 Average Risk   3.4   3.3  Average Risk       5.0   4.4  2 X Average Risk   9.6   7.1  3 X Average Risk  23.4   11.0        Use the calculated Patient Ratio above and the CHD Risk Table to determine the patient's CHD Risk.        ATP III CLASSIFICATION (LDL):  <100     mg/dL   Optimal  100-129  mg/dL   Near or Above                    Optimal  130-159  mg/dL   Borderline  160-189  mg/dL   High  >190     mg/dL   Very High Performed at [facility] [address] [place], [place] [address]   Ethanol     Status: None   Collection Time: 10/28/2018  9:10 AM  Result Value Ref Range   Alcohol, Ethyl (B) <10 <10 mg/dL    Comment: (NOTE) Lowest detectable limit for serum alcohol is 10 mg/dL. For medical purposes only. Performed at [facility], [place] [address] [place], [place] [address]     Dg Chest Portable 1 View  Result Date: 10/25/2018 CLINICAL DATA:  Respiratory failure EXAM: PORTABLE CHEST 1 VIEW COMPARISON:  01/22/2018 FINDINGS: Endotracheal tube terminates approximately 2.5 cm superior to the carina. Enteric tube courses below the diaphragm with distal tip and side hole beyond the inferior margin of the film. The heart size and mediastinal contours are within normal limits. Both lungs are clear. The visualized  skeletal structures are unremarkable. IMPRESSION: Satisfactory positioning of endotracheal and enteric tubes. Lungs are clear. Electronically Signed   By: Davina Poke M.D.   On: 10/28/2018 09:55   Ct Head Code Stroke Wo Contrast  Result Date: 10/24/2018 CLINICAL DATA:  Code stroke. Altered mental status. Last  seen normal 0500 hours EXAM: CT HEAD WITHOUT CONTRAST TECHNIQUE: Contiguous axial images were obtained from the base of the skull through the vertex without intravenous contrast. COMPARISON:  MRI 04/03/2015.  Head CT 02/15/2015. FINDINGS: Brain: Acute pontine hemorrhage with extension into the right perimesencephalic cistern and fourth ventricle. Cerebral hemispheres show chronic small-vessel ischemic change of the white matter and old infarction in the left basal ganglia and external capsule region. There is also old infarction in the left midbrain and cerebral peduncle. Question newly seen low-density in the left occipital lobe which could represent infarction or edema. Some potential that the low-density in the left lateral basal ganglia and external capsule could be recent. Vascular: No abnormal vessel finding. Skull: Negative Sinuses/Orbits: Clear/normal Other: None ASPECTS ([facility] Stroke Program Early CT Score) Not applicable in this setting. IMPRESSION: 1. Acute pontine hemorrhage with extension into the perimesencephalic cisterns on the right and into the fourth ventricle. 2. Old small vessel infarctions. Possible recent infarction in the right occipital lobe and left lateral basal ganglia/external capsule. 3. These results were communicated to Dr. Cheral Marker at 9:08 amon 09/23/2020by text page via the [facility] messaging system. Electronically Signed   By: Nelson Chimes M.D.   On: 10/31/2018 09:12   Impression/Plan   53 y.o. 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 Exam deficits listed as above, with no evidence of brainstem reflexes. Imaging reviewed with Dr Kathyrn Sheriff. There is no role for NS intervention - this is a fatal hemorrhage. This was discussed with Neurology.  Ferne Reus, PA-C [facility] [employer]

## 2018-10-31 NOTE — Progress Notes (Signed)
RT NOTE: RT transported patient on ventilator from ED to room 4N31 with no apparent complications. Vitals are stable. RT will continue to monitor.

## 2018-10-31 NOTE — H&P (Addendum)
Admission H&P    Chief Complaint: Acute onset of unresponsiveness  HPI: Karla Nelson is an 53 y.o. female who presents from home with acute onset of decreased responsiveness, progressing to unresponsiveness en route. She was LKN at 0500 when she went to the methadone clinic. She drove herself home and family heard her scream for help, then found her on the floor with decreased responsiveness. 911 was called. When EMS arrived she was still responsive, but upon arrival to ED she was only responsive to pain in the legs. In CT, she began exhibiting sonorous respirations. CT head showed a pontine hemorrhage with extension into the 4th ventricle and basal cisterns. She was emergently intubated for worsening respirations with inability to protect airway. CTA head and neck was ordered to evaluate for possible aneurysm.   LSN: [id number] tPA Given: No: ICH   Past Medical History:  Diagnosis Date  . Asthma   . CAD (coronary artery disease)    NSTEMI with cath in August 2012 with nonobstructive disease, 50% ostial D1 and 70% distal LCX and PL disease. Normal EF. Has diastolic dysfunction with elevated EDP  . Cleft palate    special denture covers defect like a C, sleeps with device in place  . COPD (chronic obstructive pulmonary disease) ([facility])    o2 and bedtime   . DYSPNEA ON EXERTION 03/08/2007  . ELECTROCARDIOGRAM, ABNORMAL 02/13/2010  . History of oxygen administration    bedtime  . HTN (hypertension)    Has normal renal arteries.  . Hyperlipidemia   . NSTEMI (non-ST elevated myocardial infarction) ([place]) 09-25-2010  . Renal disorder   . Stroke [facility])    july 2016, Dr Donnella Sham  . Tobacco abuse disorder     Past Surgical History:  Procedure Laterality Date  . CARDIAC CATHETERIZATION  Aug 2012   Moderate nonobstructive multivessel CAD with an EF of 70 to 75%  . CESAREAN SECTION    . CORONARY ANGIOPLASTY     no further intervention last cardiac visit greater than a year    Family History   Problem Relation Age of Onset  . Asthma Mother   . Heart disease Mother   . Hypertension Mother   . Asthma Brother   . Diabetes Other   . Hypertension Other    Social History:  reports that she has been smoking cigarettes. She has a 15.50 pack-year smoking history. She has never used smokeless tobacco. She reports that she does not drink alcohol or use drugs.  Allergies: No Known Allergies   ROS: Unable to obtain due to obtundation.   Physical Examination: Blood pressure (!) 231/129, last menstrual period 09/09/2014.  HEENT-  [place]/AT  Lungs - Sonorous, labored respirations with tachypnea Heart: Sinus tachycardia Abdomen - Nondistended Extremities - Warm and well perfused with no edema  Neurologic Examination: Mental Status: Obtunded with sonorous respirations. Not following any commands. No attempts to communicate. Eyes half open bilaterally. Posturing to noxious stimuli with no purposeful movements.  Cranial Nerves: II:  No blink to threat. Pupils 2 mm bilaterally and sluggish to unreactive. Intermittent pupillary asymmetry noted.  III,IV, VI: Eyelids half open, right ptotic relative to left. Eyes mildly exotropic, near the midline. Unable to assess doll's eye reflex due to c-collar. No nystagmus.  V,VII: Face grossly symmetric but unable to assess grimace. Does not blink to eyelid stimulation.  VIII: Unable to assess IX,X: Unable to assess during intubation.  XI: Unable to assess XII: Unable to assess Motor/Sensory: Decreased tone x 4, with slightly  greater tone on the right relative to the left. Adductor and flexor posturing of BUE to noxious, more brisk on the right.  Adductor posturing of BLE to noxious, more brisk on the right.  Deep Tendon Reflexes:  Right biceps and brachioradialis 2+ Left biceps and brachioradialis hypoactive Left patella 4+ with crossed adductor on the right Right patella 3+ Plantars: Equivocal bilaterally  Cerebellar/Gait: Unable to  assess   Results for orders placed or performed during the hospital encounter of 10/27/2018 (from the past 48 hour(s))  CBG monitoring, ED     Status: Abnormal   Collection Time: 10/22/2018  8:57 AM  Result Value Ref Range   Glucose-Capillary 198 (H) 70 - 99 mg/dL  I-stat chem 8, ED     Status: Abnormal   Collection Time: 10/25/2018  9:04 AM  Result Value Ref Range   Sodium 141 135 - 145 mmol/L   Potassium 3.0 (L) 3.5 - 5.1 mmol/L   Chloride 100 98 - 111 mmol/L   BUN 39 (H) 6 - 20 mg/dL   Creatinine, Ser 2.40 (H) 0.44 - 1.00 mg/dL   Glucose, Bld 196 (H) 70 - 99 mg/dL   Calcium, Ion 1.06 (L) 1.15 - 1.40 mmol/L   TCO2 27 22 - 32 mmol/L   Hemoglobin 15.3 (H) 12.0 - 15.0 g/dL   HCT 45.0 36.0 - 46.0 %   No results found.  Assessment: 53 y.o. female presenting with acute brainstem hemorrhage with extension into the 4th ventricle and basal cisterns 1. Most likely a hypertensive hemorrhage 2. Opiate dependence. Visited the methadone clinic this morning.  3. eGFR < 30 4. Covid testing pending. No symptoms described by EMS to suggest a pneumonia or other recent illness  Plan: 1. HgbA1c, fasting lipid panel 2. MRI, MRA of the brain without contrast. MRA is being obtained STAT to rule out aneurysm.  3. Unable to obtain CTA due to low GFR 4. BP management with clevidipine gtt and PRN labetalol. SBP goal of 100 -140 5. Repeat CT head in 12 hours. If there is worsening, may need neurosurgery consult 6. No antiplatelet medications or anticoagulants. DVT prophylaxis with SCDs 7. Telemetry monitoring 8. CCM has been consulted for vent management and dx/management of AKI 9. Admitting to the ICU under the Neurology service 10. Was on prednisone as outpatient, 20 mg po qd. Will need to administer via NGT. Continuing prednisone to prevent occurrence of possible [place] crisis, but may consider tapering off.  65 minutes spent in the emergent neurological evaluation and management of this critically  ill patient   Addendum: Has developed a blown pupil. STAT repeat CT head has been ordered. Calling Neurosurgery.   Electronically signed: Dr. Kerney Elbe 10/30/2018, 9:12 AM

## 2018-10-31 NOTE — Progress Notes (Signed)
RT NOTE: RT transported patient on ventilator from ED room 019 to CT and back to room 019 with no apparent complications. Vitals are stable. RT will continue to monitor.

## 2018-10-31 NOTE — Progress Notes (Signed)
Spoke to neuro MD Rory Percy at bedside. MD said to hold off on MRI until tomorrow during day shift. Will continue to monitor.

## 2018-10-31 NOTE — ED Notes (Signed)
Pt began having a "yellow substance" coming from her mouth (per pt's father) & was doing some convulsions, pt's father ran outside the room yelling for staff. MD Vanita Panda came to bedside assessing the situation, spoke with the family & this RN was ordered to administer Fentanyl.

## 2018-10-31 NOTE — ED Provider Notes (Signed)
[facility] EMERGENCY DEPARTMENT Provider Note   CSN: [id number] Arrival date & time: 10/22/2018  0854     History   Chief Complaint Chief Complaint  Patient presents with   Code Stroke    HPI Karla Nelson is a 53 y.o. female.     HPI  Patient presents unresponsive. Reportedly last seen normal time was about 3.5 hours prior to ED arrival. Reportedly the patient was in her usual state of health, went to methadone clinic, drove herself home. Just prior to EMS notification the patient was found by family members on the floor next to her bed obtunded. Level 5 caveat secondary to the patient's mental status, acuity of condition. Reportedly the patient has a history of stroke, and as above, is on methadone. EMS reports the patient was unresponsive in route, had no hypoxia, was hypertensive.  Past Medical History:  Diagnosis Date   Asthma    CAD (coronary artery disease)    NSTEMI with cath in August 2012 with nonobstructive disease, 50% ostial D1 and 70% distal LCX and PL disease. Normal EF. Has diastolic dysfunction with elevated EDP   Cleft palate    special denture covers defect like a C, sleeps with device in place   COPD (chronic obstructive pulmonary disease) ([place])    o2 and bedtime    DYSPNEA ON EXERTION 03/08/2007   ELECTROCARDIOGRAM, ABNORMAL 02/13/2010   History of oxygen administration    bedtime   HTN (hypertension)    Has normal renal arteries.   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   Healthcare maintenance 04/25/2017     Chronic pain of left lower extremity 04/25/2017   History of stroke 06/03/2016   Smoking 12/30/2014   Chronic obstructive pulmonary disease ([place]) 12/30/2014   Stroke ([place]) 09/01/2014   Right sided weakness    Smoker 03/15/2014   History of cocaine use 03/15/2014   Asthma, moderate persistent 07/26/2013   Chronic rhinosinusitis 04/11/2013   Acute sinusitis 04/11/2013   NSTEMI (non-ST elevated myocardial infarction) ([place]) 06/19/2012   Acute-on-chronic kidney injury ([place]) 06/19/2012   COPD exacerbation ([place]) 12/05/2011   COPD (chronic obstructive pulmonary disease) ([place]) 07/12/2011   CAD (coronary artery disease) 01/13/2011   Tobacco abuse 01/13/2011   Mixed hyperlipidemia 10/13/2010   ELECTROCARDIOGRAM, ABNORMAL 02/13/2010   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 Authorizing Provider  amLODipine (NORVASC) 10 MG tablet Take 1 tablet (10 mg total) by mouth at bedtime. 07/13/17   Mayo, Pete Pelt, MD  aspirin 325 MG tablet Take 1 tablet (325 mg total) by mouth daily. 09/02/14   Delfina Redwood, MD  atorvastatin (LIPITOR) 20 MG tablet Take 1 tablet (20 mg total) by mouth daily at 6 PM. 09/02/14   Delfina Redwood, MD  benzonatate (TESSALON) 100 MG capsule Take 1 capsule (100 mg total) by mouth every 8 (eight) hours. 01/22/18   Loura Halt A, NP  Cholecalciferol  (VITAMIN D PO) Take 1,000 Units by mouth.    [provider]  diclofenac sodium (VOLTAREN) 1 % GEL Apply 4 g topically 4 (four) times daily. 03/29/18   Loura Halt A, NP  gabapentin (NEURONTIN) 300 MG capsule TAKE 1 CAPSULE BY MOUTH THREE TIMES A DAY 06/05/18   Guadalupe Dawn, MD  hydrALAZINE (APRESOLINE) 25 MG tablet Take 1 tablet (25 mg total) by mouth 2 (two) times daily. 05/18/18   Guadalupe Dawn, MD  labetalol (NORMODYNE) 300 MG tablet TAKE 1 TABLET (300 MG TOTAL) BY MOUTH 2 (TWO) TIMES DAILY. 04/17/18   Guadalupe Dawn, MD  METHADONE HCL PO Take 8 mg by mouth daily.     [provider]  Multiple Vitamin (MULTIVITAMIN WITH MINERALS) TABS tablet Take 1 tablet by mouth daily.    [provider]  predniSONE (DELTASONE) 20 MG tablet Take 1 tablet (20 mg total) by mouth daily with breakfast. 08/18/18   Guadalupe Dawn, MD  promethazine-dextromethorphan (PROMETHAZINE-DM) 6.25-15 MG/5ML syrup Take 5 mLs by mouth 4 (four) times daily as needed for cough. 01/22/18   Orvan July, NP  UNABLE TO FIND Alpha lipid acid take twice a day    [provider]    Family History Family History  Problem Relation Age of Onset   Asthma Mother    Heart disease Mother    Hypertension Mother    Asthma Brother    Diabetes Other    Hypertension Other     Social History Social History   Tobacco Use   Smoking status: Current Every Day Smoker    Packs/day: 0.50    Years: 31.00    Pack years: 15.50    Types: Cigarettes   Smokeless tobacco: Never Used  Substance Use Topics   Alcohol use: No   Drug use: No    Comment: former drug use     Allergies   Patient has no known allergies.   Review of Systems Review of Systems  Unable to perform ROS: Patient nonverbal     Physical Exam Updated Vital Signs LMP 09/09/2014 (Approximate) Comment: none since august  Physical Exam Vitals signs and nursing note reviewed.  Constitutional:      Appearance: She is  well-developed. She is ill-appearing and diaphoretic.     Comments: Obtunded thin F not responding to stimuli  HENT:     Head: Normocephalic and atraumatic.  Eyes:     Conjunctiva/sclera: Conjunctivae normal.     Comments: Pinpoint pupils bilat  Cardiovascular:     Rate and Rhythm: Normal rate and regular rhythm.  Pulmonary:     Comments: Patient with appropriate saturation on room air,  frothing at the mouth. Abdominal:     General: There is no distension.  Skin:    General: Skin is warm.  Neurological:     Comments: Flaccid, responds minimally to painful stimuli  Psychiatric:        Cognition and Memory: Cognition is impaired.      ED Treatments / Results  Labs (all labs ordered are listed, but only abnormal results are displayed) Labs Reviewed  COMPREHENSIVE METABOLIC PANEL - Abnormal; Notable for the following components:      Result Value   Potassium 3.1 (*)    Glucose, Bld 203 (*)    BUN 38 (*)    Creatinine, Ser 2.51 (*)    Calcium 8.6 (*)    Albumin 3.3 (*)    GFR calc non Af Amer 21 (*)    GFR calc Af Amer 24 (*)    All other components within normal limits  I-STAT CHEM 8, ED - Abnormal; Notable for the following components:   Potassium 3.0 (*)    BUN 39 (*)    Creatinine, Ser 2.40 (*)    Glucose, Bld 196 (*)    Calcium, Ion 1.06 (*)    Hemoglobin 15.3 (*)    All other components within normal limits  CBG MONITORING, ED - Abnormal; Notable for the following components:   Glucose-Capillary 198 (*)    All other components within normal limits  SARS CORONAVIRUS 2 (HOSPITAL ORDER, [facility])  PROTIME-INR  APTT  CBC  DIFFERENTIAL  ETHANOL  HIV ANTIBODY (ROUTINE TESTING W REFLEX)  LIPID PANEL  BLOOD GAS, ARTERIAL  HEMOGLOBIN A1C  URINALYSIS, ROUTINE W REFLEX MICROSCOPIC  RAPID URINE DRUG SCREEN, HOSP PERFORMED  PREGNANCY, URINE  I-STAT BETA HCG BLOOD, ED ([facility], [facility], [facility] ONLY)    EKG EKG Interpretation  Date/Time:  Tuesday  October 31 2018 09:13:09 EDT Ventricular Rate:  58 PR Interval:    QRS Duration: 92 QT Interval:  422 QTC Calculation: 415 R Axis:   70 Text Interpretation:  Sinus rhythm LVH with secondary repolarization abnormality T wave abnormality Abnormal ECG Confirmed by Carmin Muskrat [phone]) on 10/16/2018 9:29:44 AM   Radiology Ct Head Code Stroke Wo Contrast  Result Date: 10/11/2018 CLINICAL DATA:  Code stroke. Altered mental status. Last seen normal 0500 hours EXAM: CT HEAD WITHOUT CONTRAST TECHNIQUE: Contiguous axial images were obtained from the base of the skull through the vertex without intravenous contrast. COMPARISON:  MRI 04/03/2015.  Head CT 02/15/2015. FINDINGS: Brain: Acute pontine hemorrhage with extension into the right perimesencephalic cistern and fourth ventricle. Cerebral hemispheres show chronic small-vessel ischemic change of the white matter and old infarction in the left basal ganglia and external capsule region. There is also old infarction in the left midbrain and cerebral peduncle. Question newly seen low-density in the left occipital lobe which could represent infarction or edema. Some potential that the low-density in the left lateral basal ganglia and external capsule could be recent. Vascular: No abnormal vessel finding. Skull: Negative Sinuses/Orbits: Clear/normal Other: None ASPECTS ([place] Stroke Program Early CT Score) Not applicable in this setting. IMPRESSION: 1. Acute pontine hemorrhage with extension into the perimesencephalic cisterns on the right and into the fourth ventricle. 2. Old small vessel infarctions. Possible recent infarction in the right occipital lobe and left lateral basal ganglia/external capsule. 3. These results were communicated to Dr. Cheral Marker at 9:08 amon 9/22/2020by text page via the [facility] messaging system. Electronically Signed   By: Jan Fireman.D.  On: 10/24/2018 09:12    Procedures Procedure Name: Intubation Date/Time: 10/14/2018 9:27  AM Performed by: Carmin Muskrat, MD Pre-anesthesia Checklist: Patient identified, Patient being monitored, Emergency Drugs available, Timeout performed and Suction available Oxygen Delivery Method: Non-rebreather mask Preoxygenation: Pre-oxygenation with 100% oxygen Induction Type: Rapid sequence Laryngoscope Size: Glidescope and 3 Grade View: Grade I Tube size: 7.5 mm Number of attempts: 1 Airway Equipment and Method: Video-laryngoscopy Placement Confirmation: ETT inserted through vocal cords under direct vision,  CO2 detector and Breath sounds checked- equal and bilateral Secured at: 23 cm Tube secured with: ETT holder Dental Injury: Teeth and Oropharynx as per pre-operative assessment       CRITICAL CARE Performed by: Carmin Muskrat Total critical care time: 35 minutes Critical care time was exclusive of separately billable procedures and treating other patients. Critical care was necessary to treat or prevent imminent or life-threatening deterioration. Critical care was time spent personally by me on the following activities: development of treatment plan with patient and/or surrogate as well as nursing, discussions with consultants, evaluation of patient's response to treatment, examination of patient, obtaining history from patient or surrogate, ordering and performing treatments and interventions, ordering and review of laboratory studies, ordering and review of radiographic studies, pulse oximetry and re-evaluation of patient's condition.    Medications Ordered in ED Medications  naloxone [facility]) injection 0.4 mg (0.4 mg Intravenous Not Given 11/04/2018 1112)  naloxone (NARCAN) 0.4 MG/ML injection (  Not Given 11/01/2018 1112)   stroke: mapping our early stages of recovery book (0 each Does not apply Hold 11/04/2018 1112)  acetaminophen (TYLENOL) tablet 650 mg ( Oral See Alternative 11/01/18 0209)    Or  acetaminophen (TYLENOL) solution 650 mg (650 mg Per Tube Given 11/01/18  0209)    Or  acetaminophen (TYLENOL) suppository 650 mg ( Rectal See Alternative 11/01/18 0209)  senna-docusate (Senokot-S) tablet 1 tablet (1 tablet Oral Given 10/12/2018 2122)  pantoprazole (PROTONIX) injection 40 mg (40 mg Intravenous Given 11/07/2018 2238)  amLODipine (NORVASC) tablet 10 mg (10 mg Oral Given 11/07/2018 2143)  gabapentin (NEURONTIN) 250 MG/5ML solution 300 mg (300 mg Per Tube Given 10/17/2018 2121)  predniSONE (DELTASONE) tablet 20 mg (20 mg Per Tube Given 11/01/18 0712)  fentaNYL 2570mg in NS 2568m(1060mml) infusion-PREMIX (0 mcg/hr Intravenous IV Pump Association 11/01/18 0700)  sodium chloride (hypertonic) 3 % solution ( Intravenous Rate/Dose Verify 11/01/18 0700)  Chlorhexidine Gluconate Cloth 2 % PADS 6 each (6 each Topical Given 10/30/2018 1900)  chlorhexidine gluconate (MEDLINE KIT) (PERIDEX) 0.12 % solution 15 mL (15 mLs Mouth Rinse Given 10/29/2018 2011)  MEDLINE mouth rinse (15 mLs Mouth Rinse Given 11/01/18 0541)  labetalol (NORMODYNE) injection 10 mg (10 mg Intravenous Given 11/01/18 0102)  hydrALAZINE (APRESOLINE) injection 10 mg (10 mg Intravenous Given 10/19/2018 2110)  clevidipine (CLEVIPREX) infusion 0.5 mg/mL (0 mg/hr Intravenous Stopped 11/01/18 0402)  mupirocin ointment (BACTROBAN) 2 % 1 application (1 application Nasal Given 10/22/2018 2237)  phenylephrine (NEOSYNEPHRINE) 10-0.9 MG/250ML-% infusion (130 mcg/min Intravenous New Bag/Given 11/01/18 0714)  labetalol (NORMODYNE) injection 20 mg (20 mg Intravenous Given 10/13/2018 0923)  etomidate (AMIDATE) injection (20 mg Intravenous Given 11/04/2018 0917)  rocuronium (ZEMURON) injection (100 mg Intravenous Given 10/11/2018 0917)     Initial Impression / Assessment and Plan / ED Course  I have reviewed the triage vital signs and the nursing notes.  Pertinent labs & imaging results that were available during my care of the patient were reviewed by me and considered in my medical decision making (see  chart for details).    Head CT reviewed  after being performed expeditiously soon after arrival. This is notable for brainstem hemorrhage. Patient remains hypertensive. On returning to the emergency department patient had emergent intubation performed for protection of airway, facilitation of care. This was performed without complication per Patient remained hypotensive, requiring IV labetalol and initiation of Cleviprex, IV continuous.     9:56 AM I updated her father in person  This 53 year old female with history of prior stroke, on methadone presents after being found obtunded. Here the patient is initially unresponsive, with pinpoint pupils, but no hypoxia. With differential including stroke versus hemorrhage versus overdose, the patient had emergent CT, expeditious evaluation.  Patient's findings most notable for demonstration of intracranial hemorrhage with likely pontine bleed, requiring emergent intubation.  Patient also found to have substantially elevated blood pressure, requiring initiation of continuous Cleviprex. After successful intubation, initiation of the after mentioned interventions the patient required admission to the ICU for further monitoring, management.  Next day chart update, completion: Patient remained in the emergency department for some time prior to transfer to the ICU. During this she was accompanied by family members, as she had continued to decline in her condition, with episodes of incontinence, seizure-like activity. I started the patient on a fentanyl drip for additional comfort. She had additional consultation by neurosurgery, who designated the patient's lesion as" fatal".  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

## 2018-10-31 NOTE — Consult Note (Addendum)
NAME:  Florence Tritto, MRN:  [id number], DOB:  09-04-1965, LOS: 0 ADMISSION DATE:  10/29/2018, CONSULTATION DATE: 11/05/2018 REFERRING MD:  Vanita Panda, CHIEF COMPLAINT: unresponsive  Brief History     History of present illness   53 yo female with relevant past medical history of HTN, NSTEMI s/p cath in August 2012, Stroke (July 2016), COPD, CKD and polysubstance abuse (including tobacco, cocaine and methadone) who presented to the ED via EMS after being found unresponsive.  Reportedly last seen normal time was about 3.5 hours prior to ED arrival. Reportedly the patient was in her usual state of health, went to methadone clinic, drove herself home. Just prior to EMS notification the patient was found by family members on the floor next to her bed obtunded. Reportedly the patient has a history of stroke, and as above, is on methadone.  Past Medical History  HTN CAD NSTEMI s/p catheterization in August 2012 (nonobstructive disease, 50% ostial D1 and 70% distal LCX and PL disease) Stroke (July 2016) ASTHMA CKD Poly substance abuse  Significant Hospital Events   ADMISSION 9/22  Consults:  PCCM  Procedures:  9/22: Intubated  Significant Diagnostic Tests:  9/22 CT head: acute pontine hemorrhage with extension into perimescencephalic cisterns on the right and into fourth ventricle; possible infarction in right occipital lobe and left basal ganglia/external capsule; old small vessel infarcts 9/22 CXR: Clear lungs; satisfactory positioning of endotracheal and enterics tubes   Micro Data:  N/A  Antimicrobials:  N/A  Interim history/subjective:  none  Objective   Blood pressure (!) 270/170, pulse 96, resp. rate 16, last menstrual period 09/09/2014, SpO2 100 %.    Vent Mode: PRVC FiO2 (%):  [100 %] 100 % Set Rate:  [16 bmp] 16 bmp Vt Set:  [400 mL] 400 mL PEEP:  [5 cmH20] 5 cmH20 Plateau Pressure:  [14 cmH20] 14 cmH20  No intake or output data in the 24 hours ending 11/01/2018  1003 There were no vitals filed for this visit.  Examination: General: Unresponsive, laying in bed HEENT: Normocephalic atraumatic. ETT in place. Lungs: lung sounds clear Cardiovascular: Sinus tachycardia Abdomen: Nondistended Neuro: not awake. No pupillary reflex. Doll's eyes present. No corneal reflex. Does not blink to threat.No gag reflex. Postures to noxious stimuli Extremities: Warm and well perfused with no edema   Resolved Hospital Problem list   N/A  Assessment & Plan:  Ms. Noakes is a 53 year old female with past medical history of HTN, CAD, NSTEMI s/p cath in August 2012, Stroke (July 2016), CKD and polysubstance abuse (including tobacco, cocaine and methadone) who presented to the ED via EMS after being found unresponsive now found to have an acute brainstem hemorrhage with extension into the 4th ventricle and basal cisterns on head CT.  Acute brainstem hemorrhage. Patient brought in my EMS following being found unresponsive. CT head in ED revealed  acute pontine hemorrhage with extension into perimescencephalic cisterns on the right and into fourth ventricle. Older infarcts also noted. Likely 2/2 hypertensive emergency. History of stroke in 2016. Overall, findings suggest poor prognosis. Plan per neurology Brain MRI, MRA of the brain without contrast BP management with clevidipine gtt and PRN labetalol. SBP goal of 100 -140 Repeat CT head in 12 hours. If there is worsening, may need neurosurgery consult No antiplatelet medications or anticoagulants. DVT prophylaxis with SCDs  Acute respiratory failure: Most likely 2/2 inhibition from brainstem hemorrhage. Intubated in ED. CXR not suggestive of infiltrate or pulm edema.  Most recent ABG with extremely high  pO2.  Mental status will likely be barrier to extubation. Plan: Will repeat ABG to evaluate for accuracy of that.  VAP protocol Not candidate for SBT at this time If s/s of ICH, goal would be to maintain lower CO2   Acute on chronic kidney disease: Cr 2.51 with GFR 24 on admission. Baseline GFR in ~mid 30s and Cr 1.72 on 03/29/18. Likely exacerbated by hypertensive emergency. Blood pressure on arrival 231/129. Will continue to monitor  Hypertension. Appears she is on amlodipine, labetalol and hydralazine at home. Initial blood pressure on arrival was significantly elevated how has since resolved. See neuro recs for management plan.  Best practice:  Diet: NPO Pain/Anxiety/Delirium protocol (if indicated): per neurology VAP protocol  DVT prophylaxis: SCDs GI prophylaxis: protonix Mobility: BR Code Status: Full. Family Communication: unclear if family has been notified.  Disposition: ICU  Labs   CBC: Recent Labs  Lab 10/26/2018 0855 10/13/2018 0904  WBC 8.2  --   NEUTROABS 3.4  --   HGB 14.2 15.3*  HCT 45.2 45.0  MCV 90.2  --   PLT 317  --     Basic Metabolic Panel: Recent Labs  Lab 10/13/2018 0855 10/13/2018 0904  NA 139 141  K 3.1* 3.0*  CL 102 100  CO2 24  --   GLUCOSE 203* 196*  BUN 38* 39*  CREATININE 2.51* 2.40*  CALCIUM 8.6*  --    GFR: CrCl cannot be calculated (Unknown ideal weight.). Recent Labs  Lab 11/03/2018 0855  WBC 8.2    Liver Function Tests: Recent Labs  Lab 10/23/2018 0855  AST 23  ALT 19  ALKPHOS 87  BILITOT 0.3  PROT 6.9  ALBUMIN 3.3*   No results for input(s): LIPASE, AMYLASE in the last 168 hours. No results for input(s): AMMONIA in the last 168 hours.  ABG    Component Value Date/Time   TCO2 27 10/27/2018 0904     Coagulation Profile: Recent Labs  Lab 10/20/2018 0855  INR 1.0    Cardiac Enzymes: No results for input(s): CKTOTAL, CKMB, CKMBINDEX, TROPONINI in the last 168 hours.  HbA1C: Hemoglobin A1C  Date/Time Value Ref Range Status  07/28/2017 10:13 AM 5.9 (A) 4.0 - 5.6 % Final  04/22/2017 02:55 PM 6.3  Final   Hgb A1c MFr Bld  Date/Time Value Ref Range Status  09/01/2014 07:40 AM 6.9 (H) 4.8 - 5.6 % Final    Comment:    (NOTE)          Pre-diabetes: 5.7 - 6.4         Diabetes: >6.4         Glycemic control for adults with diabetes: <7.0   09/25/2010 11:58 AM 6.8 (H) <5.7 % Final    Comment:    (NOTE)                                                                       According to the ADA Clinical Practice Recommendations for 2011, when HbA1c is used as a screening test:  >=6.5%   Diagnostic of Diabetes Mellitus           (if abnormal result is confirmed) 5.7-6.4%   Increased risk of developing Diabetes Mellitus References:Diagnosis and Classification of Diabetes Mellitus,Diabetes  [id number] 1):S62-S69 and Standards of Medical Care in         Diabetes - 2011,Diabetes [id number] (Suppl 1):S11-S61.    CBG: Recent Labs  Lab 10/19/2018 0857  GLUCAP 198*    Review of Systems:   Unable to obtain due to obtunded state.  Past Medical History  She,  has a past medical history of Asthma, CAD (coronary artery disease), Cleft palate, COPD (chronic obstructive pulmonary disease) ([place]), DYSPNEA ON EXERTION (03/08/2007), ELECTROCARDIOGRAM, ABNORMAL (02/13/2010), History of oxygen administration, HTN (hypertension), Hyperlipidemia, NSTEMI (non-ST elevated myocardial infarction) ([place]) (09-25-2010), Renal disorder, Stroke ([place]), and Tobacco abuse disorder.   Surgical History    Past Surgical History:  Procedure Laterality Date  . CARDIAC CATHETERIZATION  Aug 2012   Moderate nonobstructive multivessel CAD with an EF of 70 to 75%  . CESAREAN SECTION    . CORONARY ANGIOPLASTY     no further intervention last cardiac visit greater than a year     Social History   reports that she has been smoking cigarettes. She has a 15.50 pack-year smoking history. She has never used smokeless tobacco. She reports that she does not drink alcohol or use drugs.   Family History   Her family history includes Asthma in her brother and mother; Diabetes in an other family member; Heart disease in her mother; Hypertension in her  mother and another family member.   Allergies No Known Allergies   Home Medications  Prior to Admission medications   Medication Sig Start Date End Date Taking? Authorizing Provider  amLODipine (NORVASC) 10 MG tablet Take 1 tablet (10 mg total) by mouth at bedtime. 07/13/17   Mayo, Pete Pelt, MD  aspirin 325 MG tablet Take 1 tablet (325 mg total) by mouth daily. 09/02/14   Delfina Redwood, MD  atorvastatin (LIPITOR) 20 MG tablet Take 1 tablet (20 mg total) by mouth daily at 6 PM. 09/02/14   Delfina Redwood, MD  benzonatate (TESSALON) 100 MG capsule Take 1 capsule (100 mg total) by mouth every 8 (eight) hours. 01/22/18   Loura Halt A, NP  Cholecalciferol (VITAMIN D PO) Take 1,000 Units by mouth.    [provider]  diclofenac sodium (VOLTAREN) 1 % GEL Apply 4 g topically 4 (four) times daily. 03/29/18   Bast, Tressia Miners A, NP  gabapentin (NEURONTIN) 300 MG capsule TAKE 1 CAPSULE BY MOUTH THREE TIMES A DAY Patient taking differently: Take 300 mg by mouth 3 (three) times daily.  06/05/18   Guadalupe Dawn, MD  hydrALAZINE (APRESOLINE) 25 MG tablet Take 1 tablet (25 mg total) by mouth 2 (two) times daily. 05/18/18   Guadalupe Dawn, MD  labetalol (NORMODYNE) 300 MG tablet TAKE 1 TABLET (300 MG TOTAL) BY MOUTH 2 (TWO) TIMES DAILY. 04/17/18   Guadalupe Dawn, MD  METHADONE HCL PO Take 8 mg by mouth daily.     [provider]  Multiple Vitamin (MULTIVITAMIN WITH MINERALS) TABS tablet Take 1 tablet by mouth daily.    [provider]  predniSONE (DELTASONE) 20 MG tablet Take 1 tablet (20 mg total) by mouth daily with breakfast. 08/18/18   Guadalupe Dawn, MD  promethazine-dextromethorphan (PROMETHAZINE-DM) 6.25-15 MG/5ML syrup Take 5 mLs by mouth 4 (four) times daily as needed for cough. 01/22/18   Orvan July, NP  UNABLE TO FIND Alpha lipid acid take twice a day    [provider]     Mitzi Hansen, MD [facility] PGY-1 11/08/2018  10:03 AM   Attending Note:  53 year old female with PMH of essential HTN who presents to PCCM with respiratory post ICH.  No events since admission and intubation.  On exam, the patient is completely unresponsive with no pupillary, corneals or doll's eye or gag but does have a respiratory drive with clear lungs.  I reviewed CXR myself, ETT is in a good position.  Discussed with PCCM-NP.  Will adjust vent for ABG.  Neuro management per primary.  Control BP as above.  PCCM will continue to follow.  The patient is critically ill with multiple organ systems failure and requires high complexity decision making for assessment and support, frequent evaluation and titration of therapies, application of advanced monitoring technologies and extensive interpretation of multiple databases.   Critical Care Time devoted to patient care services described in this note is  45  Minutes. This time reflects time of care of this signee Dr Jennet Maduro. This critical care time does not reflect procedure time, or teaching time or supervisory time of PA/NP/Med student/Med Resident etc but could involve care discussion time.  Rush Farmer, M.D. [facility] Pulmonary/Critical Care Medicine. Pager: [phone]. After hours pager: [phone].

## 2018-11-01 ENCOUNTER — Inpatient Hospital Stay (HOSPITAL_COMMUNITY): Payer: 59

## 2018-11-01 DIAGNOSIS — G911 Obstructive hydrocephalus: Secondary | ICD-10-CM

## 2018-11-01 DIAGNOSIS — G936 Cerebral edema: Secondary | ICD-10-CM

## 2018-11-01 DIAGNOSIS — I161 Hypertensive emergency: Secondary | ICD-10-CM | POA: Diagnosis present

## 2018-11-01 DIAGNOSIS — I6389 Other cerebral infarction: Secondary | ICD-10-CM

## 2018-11-01 DIAGNOSIS — G935 Compression of brain: Secondary | ICD-10-CM

## 2018-11-01 DIAGNOSIS — J9600 Acute respiratory failure, unspecified whether with hypoxia or hypercapnia: Secondary | ICD-10-CM

## 2018-11-01 DIAGNOSIS — I615 Nontraumatic intracerebral hemorrhage, intraventricular: Secondary | ICD-10-CM

## 2018-11-01 LAB — HEMOGLOBIN A1C
Hgb A1c MFr Bld: 6.6 % — ABNORMAL HIGH (ref 4.8–5.6)
Mean Plasma Glucose: 143 mg/dL

## 2018-11-01 LAB — RAPID URINE DRUG SCREEN, HOSP PERFORMED
Amphetamines: NOT DETECTED
Barbiturates: NOT DETECTED
Benzodiazepines: NOT DETECTED
Cocaine: NOT DETECTED
Opiates: NOT DETECTED
Tetrahydrocannabinol: NOT DETECTED

## 2018-11-01 LAB — CBC
HCT: 44.5 % (ref 36.0–46.0)
Hemoglobin: 14.2 g/dL (ref 12.0–15.0)
MCH: 28.4 pg (ref 26.0–34.0)
MCHC: 31.9 g/dL (ref 30.0–36.0)
MCV: 89 fL (ref 80.0–100.0)
Platelets: 275 10*3/uL (ref 150–400)
RBC: 5 MIL/uL (ref 3.87–5.11)
RDW: 15.3 % (ref 11.5–15.5)
WBC: 14.6 10*3/uL — ABNORMAL HIGH (ref 4.0–10.5)
nRBC: 0 % (ref 0.0–0.2)

## 2018-11-01 LAB — URINALYSIS, ROUTINE W REFLEX MICROSCOPIC
Bacteria, UA: NONE SEEN
Bilirubin Urine: NEGATIVE
Glucose, UA: NEGATIVE mg/dL
Ketones, ur: NEGATIVE mg/dL
Leukocytes,Ua: NEGATIVE
Nitrite: NEGATIVE
Protein, ur: 30 mg/dL — AB
Specific Gravity, Urine: 1.005 (ref 1.005–1.030)
pH: 7 (ref 5.0–8.0)

## 2018-11-01 LAB — BASIC METABOLIC PANEL
Anion gap: 12 (ref 5–15)
BUN: 54 mg/dL — ABNORMAL HIGH (ref 6–20)
CO2: 26 mmol/L (ref 22–32)
Calcium: 7.7 mg/dL — ABNORMAL LOW (ref 8.9–10.3)
Chloride: 115 mmol/L — ABNORMAL HIGH (ref 98–111)
Creatinine, Ser: 3.96 mg/dL — ABNORMAL HIGH (ref 0.44–1.00)
GFR calc Af Amer: 14 mL/min — ABNORMAL LOW (ref >60)
GFR calc non Af Amer: 12 mL/min — ABNORMAL LOW (ref >60)
Glucose, Bld: 209 mg/dL — ABNORMAL HIGH (ref 70–99)
Potassium: 3.9 mmol/L (ref 3.5–5.1)
Sodium: 153 mmol/L — ABNORMAL HIGH (ref 135–145)

## 2018-11-01 LAB — MAGNESIUM: Magnesium: 2.3 mg/dL (ref 1.7–2.4)

## 2018-11-01 LAB — HEPATIC FUNCTION PANEL
ALT: 19 U/L (ref 0–44)
AST: 62 U/L — ABNORMAL HIGH (ref 15–41)
Albumin: 2.4 g/dL — ABNORMAL LOW (ref 3.5–5.0)
Alkaline Phosphatase: 70 U/L (ref 38–126)
Bilirubin, Direct: 0.1 mg/dL (ref 0.0–0.2)
Indirect Bilirubin: 0.4 mg/dL (ref 0.3–0.9)
Total Bilirubin: 0.5 mg/dL (ref 0.3–1.2)
Total Protein: 5.9 g/dL — ABNORMAL LOW (ref 6.5–8.1)

## 2018-11-01 LAB — SODIUM
Sodium: 145 mmol/L (ref 135–145)
Sodium: 157 mmol/L — ABNORMAL HIGH (ref 135–145)

## 2018-11-01 LAB — PROTIME-INR
INR: 1.2 (ref 0.8–1.2)
Prothrombin Time: 15.5 seconds — ABNORMAL HIGH (ref 11.4–15.2)

## 2018-11-01 LAB — ECHOCARDIOGRAM COMPLETE: Height: 62 in

## 2018-11-01 LAB — HIV ANTIBODY (ROUTINE TESTING W REFLEX): HIV Screen 4th Generation wRfx: NONREACTIVE

## 2018-11-01 LAB — GLUCOSE, CAPILLARY: Glucose-Capillary: 151 mg/dL — ABNORMAL HIGH (ref 70–99)

## 2018-11-01 LAB — FIBRINOGEN: Fibrinogen: 659 mg/dL — ABNORMAL HIGH (ref 210–475)

## 2018-11-01 LAB — PREGNANCY, URINE: Preg Test, Ur: NEGATIVE

## 2018-11-01 LAB — ABO/RH: ABO/RH(D): B POS

## 2018-11-01 MED ORDER — SODIUM CHLORIDE 0.9 % IV SOLN
INTRAVENOUS | Status: DC
  Administered 2018-11-01 – 2018-11-02 (×2): via INTRAVENOUS

## 2018-11-01 MED ORDER — PHENYLEPHRINE HCL-NACL 10-0.9 MG/250ML-% IV SOLN
0.0000 ug/min | INTRAVENOUS | Status: DC
  Administered 2018-11-01: 110 ug/min via INTRAVENOUS
  Administered 2018-11-01: 20 ug/min via INTRAVENOUS
  Administered 2018-11-01: 90 ug/min via INTRAVENOUS
  Administered 2018-11-01: 60 ug/min via INTRAVENOUS
  Administered 2018-11-01: 40 ug/min via INTRAVENOUS
  Administered 2018-11-01: 07:00:00 130 ug/min via INTRAVENOUS
  Administered 2018-11-01: 45 ug/min via INTRAVENOUS
  Administered 2018-11-02: 15 ug/min via INTRAVENOUS
  Filled 2018-11-01: qty 250
  Filled 2018-11-01: qty 500
  Filled 2018-11-01: qty 250
  Filled 2018-11-01: qty 500
  Filled 2018-11-01 (×2): qty 250

## 2018-11-01 NOTE — Progress Notes (Signed)
PCCM Interval Note  Per documentation of Dr. Phoebe Sharps note with prior family discussions, plans to proceed with withdrawal of care tonight given devasatating neurological injury.  CDS has spoken with family who have agreed to donation.  CDS requesting further testing for brain death declaration.     Called to bedside for possible apnea test to proceed with determination of brain death testing, however on chart review, appears her fentanyl gtt was stopped at 0342 this morning and her labs today show sCr of 3.96 with GFR of 14.    I do not feel this is adequate time for patient to have metabolized this.  Spoke with Dr. Rory Percy w/ neurology, Dr. Jimmy Footman in [place], and Dr. Gilford Raid who are in concordance.  Will defer testing for now to determine brain death testing in the morning.  NM cerebral fusion study not available at night.   CDC plans on continuing with DCD plans for now.  Will continue with ancillary test tonight.   P:  Aline per RT, CT chest/ abd/ pelvis, UC, BCx 2, LFTs, coags, fibrinogen, ABO and will need to contact the ME to rule out for ME case.   Day team to determine further appropriate test in am for full declaration of brain death testing.    Kennieth Rad, MSN, AGACNP-BC [place] [facility] Pgr: [phone] or if no answer [phone] 11/01/2018, 8:42 PM

## 2018-11-01 NOTE — Progress Notes (Signed)
eLink Physician-Brief Progress Note Patient Name: Zandrea Oles DOB: 09/01/1965 MRN: [id number]   Date of Service  11/01/2018  HPI/Events of Note  Oliguria - Bladder scan > 400 mL.   eICU Interventions  Will order: 1. I/O Cath PRN.     Intervention Category Intermediate Interventions: Oliguria - evaluation and management  Vena Bassinger Eugene 11/01/2018, 5:25 AM

## 2018-11-01 NOTE — Progress Notes (Signed)
Neuro MD Rory Percy paged. MD made aware that patient's left pupil is now fixed at 6 and nonreactive. MD also made aware that patient's heart rate is sustaining in the 120s. No new orders received. Will continue to monitor.

## 2018-11-01 NOTE — Progress Notes (Signed)
Contacted E-Link MD about patient's hypotension. MD made aware that cleviprex is stopped. MD also made aware that 3% saline is running and we are trying to get blood sodium level up. New orders received for phenylephrine infusion. Will continue to monitor.

## 2018-11-01 NOTE — Progress Notes (Signed)
Neuro MD Rory Percy updated on new orders for phenylephrine and the rate it is currently running at to achieve blood pressure goals (300 mL/hour). I was concerned that at that rate it will affect sodium goals of patient. MD wishes to wait for latest sodium lab to result before we do anything with the rate of the 3%. Unable to get orders for more concentrated phenylephrine d/t no central line. No new orders received at this time, will continue to monitor.

## 2018-11-01 NOTE — Plan of Care (Addendum)
Discussed with patient fianc Rosiland Oz over the phone.I updated pt current condition most consistent with clinical brain death, and poor prognosis, and answered all the questions.  He expressed understanding and appreciation.  He stated that he need to discuss with patient father who is in the waiting room outside ICU and get back to me regarding either proceed with apnea test or proceed with withdrawal care by 7 PM.  Rosalin Hawking, MD PhD Stroke Neurology 11/01/2018 6:27 PM  Received phone call from Mr. Truman Hayward, and I talked with him along with patient father and son over the speaker phone. They are in unanimous decision that they want withdraw of care given the nature of the condition is serious and terminal. As per family wishes we will initiate withdraw of care and stop any life prolonging therapies. I agree with withdraw of care and comfort care.  Rosalin Hawking, MD PhD Stroke Neurology 11/01/2018 7:09 PM

## 2018-11-01 NOTE — Progress Notes (Addendum)
NAME:  Karla Nelson, MRN:  [id number], DOB:  17-Oct-1965, LOS: 1 ADMISSION DATE:  10/29/2018, CONSULTATION DATE: 10/12/2018 REFERRING MD:  Vanita Panda, CHIEF COMPLAINT: unresponsive    History of present illness   53 yo female with relevant past medical history of HTN, NSTEMI s/p cath in August 2012, Stroke (July 2016), COPD, CKD and polysubstance abuse (including tobacco, cocaine and methadone) who presented to the ED via EMS after being found unresponsive.  Reportedly last seen normal time was about 3.5 hours prior to ED arrival. Reportedly the patient was in her usual state of health, went to methadone clinic, drove herself home.  Just prior to EMS notification the patient was found by family members on the floor next to her bed obtunded.  In the ED was found to have large intraparenchymal bleed and was admitted to Neurology.  3% NS initiated and she was intubated.  Overnight had worsening exam and repeat CT with extension of bleed and midline shift.  Neurosurgery was consulted and felt this was a fatal bleed without indication for intervention.  She remains unresponsive without reflexes.   Past Medical History  HTN CAD NSTEMI s/p catheterization in August 2012 (nonobstructive disease, 50% ostial D1 and 70% distal LCX and PL disease) Stroke (July 2016) ASTHMA CKD Poly substance abuse  Significant Hospital Events   9/22-admit to neurology  Consults:  PCCM Neurosurgery  Procedures:  9/22: Intubated  Significant Diagnostic Tests:  9/22 CT head: acute pontine hemorrhage with extension into perimescencephalic cisterns on the right and into fourth ventricle; possible infarction in right occipital lobe and left basal ganglia/external capsule; old small vessel infarcts 9/22 CXR: Clear lungs; satisfactory positioning of endotracheal and enterics tubes 9/23 Unresponsive, fatal bleed per NS, supportive care until family can be at the bedside   Micro Data:  9/22 MRSA screen>>pos 9/22  Sars-CoV-2>>neg  Antimicrobials:  N/A  Interim history/subjective:  Worsening pupillary exam and extension of bleed on CT overnight, initially on Clevidipine for HTN, now on neo for BP support  Objective   Blood pressure 117/78, pulse 76, temperature 99.3 F (37.4 C), temperature source Axillary, resp. rate 16, height 5\' 2"  (1.575 m), last menstrual period 09/09/2014, SpO2 97 %.    Vent Mode: PRVC FiO2 (%):  [40 %-100 %] 40 % Set Rate:  [16 bmp] 16 bmp Vt Set:  [400 mL] 400 mL PEEP:  [5 cmH20] 5 cmH20 Plateau Pressure:  [14 cmH20-15 cmH20] 15 cmH20   Intake/Output Summary (Last 24 hours) at 11/01/2018 0757 Last data filed at 11/01/2018 0700 Gross per 24 hour  Intake 1698.64 ml  Output 680 ml  Net 1018.64 ml   There were no vitals filed for this visit.  Examination: General:  Unresponsive F, no posturing HEENT: MM pink/moist Neuro: pupils 19mm, fixed and unresponsive bilaterally, no corneal reflex, no posturing, does not withdraw to pain, not breathing over the vent, no doll's eye reflex CV: s1s2,RRR, no m/r/g PULM:  ETT tube in place, full vent support, lungs clear, no secretions GI: soft, bsx4 active  Extremities: warm/dry, no edema  Skin: no rashes or lesions    Resolved Hospital Problem list   N/A  Assessment & Plan:  Karla Nelson is a 53 year old female with past medical history of HTN, CAD, NSTEMI s/p cath in August 2012, Stroke (July 2016), CKD and polysubstance abuse (including tobacco, cocaine and methadone) who presented to the ED via EMS after being found unresponsive now found to have an acute brainstem hemorrhage with extension into  the 4th ventricle and basal cisterns on head CT.  Acute pontine and R thalamic ICH with edema and mass effect -Received 3% saline and HOB elevated to 30 degrees -No indication for surgical intervention per NS, fatal bleed -will likely progress to brain death -Full Code until family can be at the bedside per Neurology P:  -Continue supportive care, BP support with Neo to maintain SBP goal 100-140 -MRI and MRA ordered by neurology -Echo pending    Acute Respiratory Failure -Secondary to devastating neurologic injury P: -Continue full Vent support, titrate FiO2 to >94%  -VAP bundle      Acute chronic renal failure creatinine 2.4 on admission (baseline 1.5-1.7), K 3.0 P: -Repeat BMET and Mag level this morning, supportive care    HTN -Initially requiring Clevidipine gtt, hypotensive overnight SBP 70-80 P: -Hold Antihypertensive medication and continue neosynephrine gtt to maintain MAP >65    Best practice:  Diet: NPO Pain/Anxiety/Delirium protocol (if indicated): per neurology VAP protocol  DVT prophylaxis: SCDs GI prophylaxis: protonix Mobility: BR Code Status: Full. Family Communication: per primary Disposition: ICU  Labs   CBC: Recent Labs  Lab 10/22/2018 0855 10/16/2018 0904 11/03/2018 1035 10/13/2018 1222  WBC 8.2  --   --   --   NEUTROABS 3.4  --   --   --   HGB 14.2 15.3* 15.3* 15.6*  HCT 45.2 45.0 45.0 46.0  MCV 90.2  --   --   --   PLT 317  --   --   --     Basic Metabolic Panel: Recent Labs  Lab 10/13/2018 0855 10/29/2018 0904 11/03/2018 1035 10/11/2018 1222 10/16/2018 1821 10/30/2018 2227 11/01/18 0429  NA 139 141 138 137 137 142 145  K 3.1* 3.0* 3.0* 3.0*  --   --   --   CL 102 100  --   --   --   --   --   CO2 24  --   --   --   --   --   --   GLUCOSE 203* 196*  --   --   --   --   --   BUN 38* 39*  --   --   --   --   --   CREATININE 2.51* 2.40*  --   --   --   --   --   CALCIUM 8.6*  --   --   --   --   --   --    GFR: CrCl cannot be calculated (Unknown ideal weight.). Recent Labs  Lab 10/16/2018 0855  WBC 8.2    Liver Function Tests: Recent Labs  Lab 11/05/2018 0855  AST 23  ALT 19  ALKPHOS 87  BILITOT 0.3  PROT 6.9  ALBUMIN 3.3*   No results for input(s): LIPASE, AMYLASE in the last 168 hours. No results for input(s): AMMONIA in the last 168 hours.   ABG    Component Value Date/Time   PHART 7.386 10/19/2018 1222   PCO2ART 54.9 (H) 10/30/2018 1222   PO2ART 122.0 (H) 10/30/2018 1222   HCO3 32.9 (H) 10/12/2018 1222   TCO2 35 (H) 10/30/2018 1222   O2SAT 99.0 10/25/2018 1222     Coagulation Profile: Recent Labs  Lab 11/07/2018 0855  INR 1.0    Cardiac Enzymes: No results for input(s): CKTOTAL, CKMB, CKMBINDEX, TROPONINI in the last 168 hours.  HbA1C: Hemoglobin A1C  Date/Time Value Ref Range Status  07/28/2017 10:13 AM 5.9 (A) 4.0 -  5.6 % Final  04/22/2017 02:55 PM 6.3  Final   Hgb A1c MFr Bld  Date/Time Value Ref Range Status  11/04/2018 09:10 AM 6.6 (H) 4.8 - 5.6 % Final    Comment:    (NOTE)         Prediabetes: 5.7 - 6.4         Diabetes: >6.4         Glycemic control for adults with diabetes: <7.0   09/01/2014 07:40 AM 6.9 (H) 4.8 - 5.6 % Final    Comment:    (NOTE)         Pre-diabetes: 5.7 - 6.4         Diabetes: >6.4         Glycemic control for adults with diabetes: <7.0     CBG: Recent Labs  Lab 10/26/2018 0857  GLUCAP 198*   CRITICAL CARE Performed by: Otilio Carpen Murry Diaz   Total critical care time: 40 minutes  Critical care time was exclusive of separately billable procedures and treating other patients.  Critical care was necessary to treat or prevent imminent or life-threatening deterioration secondary to brainstem hemorrhage and neurologic respiratory failure.  Critical care was time spent personally by me on the following activities: development of treatment plan with patient and/or surrogate as well as nursing, discussions with consultants, evaluation of patient's response to treatment, examination of patient, obtaining history from patient or surrogate, ordering and performing treatments and interventions, ordering and review of laboratory studies, ordering and review of radiographic studies, pulse oximetry and re-evaluation of patient's condition.    Otilio Carpen Nishaan Stanke, PA-C [place] [facility]   Pager# [phone], if no answer [phone]

## 2018-11-01 NOTE — Progress Notes (Signed)
PT Cancellation Note  Patient Details Name: Saki Gasparyan MRN: [id number] DOB: May 22, 1965   Cancelled Treatment:    Reason Eval/Treat Not Completed: Medical issues which prohibited therapy; note large bleed with poor prognosis, will monitor.    Reginia Naas 11/01/2018, 9:04 AM  Magda Kiel, [place] [phone] 11/01/2018

## 2018-11-01 NOTE — Procedures (Signed)
Arterial Catheter Insertion Procedure Note Karla Nelson [id number] May 19, 1965  Procedure: Insertion of Arterial Catheter  Indications: Blood pressure monitoring and Frequent blood sampling  Procedure Details Consent: Unable to obtain consent because of emergent medical necessity. Time Out: Verified patient identification, verified procedure, site/side was marked, verified correct patient position, special equipment/implants available, medications/allergies/relevent history reviewed, required imaging and test results available.  Performed  Maximum sterile technique was used including antiseptics, cap, gloves, gown, hand hygiene, mask and sheet. Skin prep: Chlorhexidine; local anesthetic administered 20 gauge catheter was inserted into right radial artery using the Seldinger technique. ULTRASOUND GUIDANCE USED: NO Evaluation Blood flow good; BP tracing good. Complications: No apparent complications.    Lynann Bologna 11/01/2018

## 2018-11-01 NOTE — Progress Notes (Signed)
eLink Physician-Brief Progress Note Patient Name: Adelina Latin DOB: 1965/09/02 MRN: [id number]   Date of Service  11/01/2018  HPI/Events of Note  Hypotension - BP = 77/61 with MAP + 68.   eICU Interventions  Will order: 1. Phenylephrine IV infusion. Titrate to MAP >= 65.      Intervention Category Major Interventions: Hypotension - evaluation and management  Kaaliyah Kita Eugene 11/01/2018, 4:26 AM

## 2018-11-01 NOTE — Progress Notes (Signed)
OT Cancellation Note  Patient Details Name: Karmel Bowen MRN: [id number] DOB: [birth year]   Cancelled Treatment:    Reason Eval/Treat Not Completed: Patient not medically ready  Note reviewed, noted poor prognosis.  Will monitor.  Lucille Passy, OTR/L [place] Pager [phone] Office [phone]   Lucille Passy M 11/01/2018, 5:57 AM

## 2018-11-01 NOTE — Progress Notes (Signed)
This RN spoke with Medical Examiner Doy Hutching. ME stated he reviewed all pertinent patient information, and I answered all questions to the best of my ability and his statisfaction. Given this, ME Peterman said that patient is not an ME case at this time. Will continue to monitor.

## 2018-11-01 NOTE — Progress Notes (Signed)
Neuro MD Rory Percy paged about patient's sodium level of 145. No new orders received. Will continue to monitor.

## 2018-11-01 NOTE — Progress Notes (Signed)
STROKE TEAM PROGRESS NOTE   INTERVAL HISTORY Pt intubated on vent, not response. Pupils dilated and fixed. No brainstem function except able to generate breath when the vent rate turned down. UDS neg. Talked to pt mom and she was asking me to talk to pt son and father. I talked with son at bedside and he was asking pt finance to visit from [place] this afternoon before making decisions.   Vitals:   11/01/18 0625 11/01/18 0630 11/01/18 0645 11/01/18 0700  BP: 102/70 104/70 119/75 117/78  Pulse: 77 76 76 76  Resp: 16 16 16 16   Temp:      TempSrc:      SpO2: 97% 97% 97% 97%  Height:        CBC:  Recent Labs  Lab 11/04/2018 0855  10/19/2018 1035 10/29/2018 1222  WBC 8.2  --   --   --   NEUTROABS 3.4  --   --   --   HGB 14.2   < > 15.3* 15.6*  HCT 45.2   < > 45.0 46.0  MCV 90.2  --   --   --   PLT 317  --   --   --    < > = values in this interval not displayed.    Basic Metabolic Panel:  Recent Labs  Lab 10/30/2018 0855 10/28/2018 0904 10/28/2018 1035 10/27/2018 1222  10/20/2018 2227 11/01/18 0429  NA 139 141 138 137   < > 142 145  K 3.1* 3.0* 3.0* 3.0*  --   --   --   CL 102 100  --   --   --   --   --   CO2 24  --   --   --   --   --   --   GLUCOSE 203* 196*  --   --   --   --   --   BUN 38* 39*  --   --   --   --   --   CREATININE 2.51* 2.40*  --   --   --   --   --   CALCIUM 8.6*  --   --   --   --   --   --    < > = values in this interval not displayed.   Lipid Panel:     Component Value Date/Time   CHOL 176 10/15/2018 0910   CHOL 151 04/22/2017 1457   TRIG 138 10/28/2018 0910   HDL 48 10/16/2018 0910   HDL 43 04/22/2017 1457   CHOLHDL 3.7 11/03/2018 0910   VLDL 28 10/12/2018 0910   LDLCALC 100 (H) 10/10/2018 0910   LDLCALC 84 04/22/2017 1457   HgbA1c:  Lab Results  Component Value Date   HGBA1C 6.6 (H) 10/18/2018   Urine Drug Screen:     Component Value Date/Time   LABOPIA POSITIVE (A) 09/04/2014 1544   COCAINSCRNUR NONE DETECTED 09/04/2014 1544   LABBENZ NONE  DETECTED 09/04/2014 1544   AMPHETMU NONE DETECTED 09/04/2014 1544   THCU NONE DETECTED 09/04/2014 1544   LABBARB NONE DETECTED 09/04/2014 1544    Alcohol Level     Component Value Date/Time   ETH <10 10/10/2018 0910    IMAGING Ct Head Wo Contrast  Result Date: 11/01/2018 CLINICAL DATA:  Follow-up examination for intracerebral hemorrhage. EXAM: CT HEAD WITHOUT CONTRAST TECHNIQUE: Contiguous axial images were obtained from the base of the skull through the vertex without intravenous contrast. COMPARISON:  Prior  CT from 10/24/2018. FINDINGS: Brain: There has been continued interval progression of previously identified intraparenchymal hemorrhage centered at the pons, again seen extending cephalad into the midbrain and along the right cortical spinal tract into the right thalamus and corona radiata. Localized edema and regional mass effect has increased. Hemorrhage seen extending into the fourth ventricle, with extension into the cerebral aqueduct and third ventricle, with small volume hemorrhage now seen layering within the occipital horns of both lateral ventricles. Increased ventricular dilatation including temporal horn dilatation compatible with obstructive hydrocephalus. Mild localized right-to-left shift at the level of the third ventricle/cerebral aqueduct. Suggestion of loss of cortical sulcation elsewhere within the brain, suggesting continued developing edema. Cerebellar tonsils remain above the foramen magnum. Underlying cerebral white matter disease again noted. No extra-axial fluid collection. Vascular: No asymmetric hyperdense vessel. Skull: Scalp soft tissues and calvarium within normal limits. Sinuses/Orbits: Globes and orbital soft tissues demonstrate no acute finding. Absence of the nasal septum again noted. Chronic mucoperiosteal thickening within the ethmoidal air cells and maxillary sinuses again noted. Mastoid air cells remain clear. Other: None. IMPRESSION: 1. Continued interval  progression of intraparenchymal hemorrhage centered at the pons with extension into the right thalamus and corona radiata. Localized edema and regional mass effect has worsened, with localized right-to-left midline shift at the level of the thalamus. 2. Intraventricular extension with blood within the fourth, third, and lateral ventricles. Increased ventricular dilatation compatible with associated obstructive hydrocephalus, also worsened from previous. 3. Progressive loss of cortical sulcation elsewhere within the brain, consistent with continued developing cerebral edema. No frank transtentorial herniation at this time. 4. No other new acute intracranial abnormality. Critical Value/emergent results were called by telephone at the time of interpretation on 11/01/2018 at 2:16 am to providerDr. Rory Percy, who verbally acknowledged these results. Electronically Signed   By: Jeannine Boga M.D.   On: 11/01/2018 02:18   Ct Head Wo Contrast  Result Date: 10/19/2018 CLINICAL DATA:  Worsening neuro status. EXAM: CT HEAD WITHOUT CONTRAST CT CERVICAL SPINE WITHOUT CONTRAST TECHNIQUE: Multidetector CT imaging of the head and cervical spine was performed following the standard protocol without intravenous contrast. Multiplanar CT image reconstructions of the cervical spine were also generated. COMPARISON:  None. FINDINGS: CT HEAD FINDINGS Brain: There is progression of the previously noted pontine hemorrhage, now extending superiorly along the cortical spinal tracts to the level of the corona radiata. Progressive hemorrhage extends into the fourth ventricle and superiorly to the third ventricle. Lateral ventricles have increased in size. There is some layering blood within the posterior horn of the right lateral ventricle. Diffuse white matter disease is again seen. There is further effacement of the sulci consistent with diffuse mass effect. Cerebellar tonsils remain above the foramen magnum. Vascular: Atherosclerotic  calcifications are present within the cavernous internal carotid arteries. There is no hyperdense vessel. Skull: Calvarium is intact. No focal lytic or blastic lesions are present. Sinuses/Orbits: Nasal septum is absent. Mild mucosal thickening is present within the maxillary sinuses and scattered through the remaining ethmoid air cells. The paranasal sinuses and mastoid air cells are otherwise clear. The globes and orbits are within normal limits. CT CERVICAL SPINE FINDINGS Alignment: There is straightening of the normal cervical lordosis. Slight anterolisthesis present at C3-4. No other significant listhesis is present. Skull base and vertebrae: Craniocervical junction is normal. Vertebral body heights are maintained. No acute or healing fractures are present. Soft tissues and spinal canal: Extra-axial hemorrhage is noted at the foramen magnum. This does not extend inferiorly within the  canal. Soft tissues the neck demonstrate atherosclerotic calcifications on the left. OG tube and endotracheal tube are in place. Disc levels: Asymmetric facet degenerative changes are noted on the right at C3-4. Uncovertebral spurring contributes to left foraminal narrowing at C5-6 and mild right foraminal narrowing at C6-7. Upper chest: The lung apices are clear. IMPRESSION: 1. Progression of intracranial hemorrhage extending now cephalad along the right cortical spinal tracts to the level of the corona radiata. 2. Increasing intracranial mass effect. Early midline shift is present at the level of the thalami. There is further effacement of the sulci. 3. Dilating lateral ventricles consistent with developing hydrocephalus. 4. Degenerative changes in the cervical spine without acute abnormality. Critical Value/emergent results were called by telephone at the time of interpretation on 11/05/2018 at 10:43 am to [facility], who verbally acknowledged these results. Electronically Signed   By: San Morelle M.D.   On:  11/01/2018 10:50   Ct Cervical Spine Wo Contrast  Result Date: 10/29/2018 CLINICAL DATA:  Worsening neuro status. EXAM: CT HEAD WITHOUT CONTRAST CT CERVICAL SPINE WITHOUT CONTRAST TECHNIQUE: Multidetector CT imaging of the head and cervical spine was performed following the standard protocol without intravenous contrast. Multiplanar CT image reconstructions of the cervical spine were also generated. COMPARISON:  None. FINDINGS: CT HEAD FINDINGS Brain: There is progression of the previously noted pontine hemorrhage, now extending superiorly along the cortical spinal tracts to the level of the corona radiata. Progressive hemorrhage extends into the fourth ventricle and superiorly to the third ventricle. Lateral ventricles have increased in size. There is some layering blood within the posterior horn of the right lateral ventricle. Diffuse white matter disease is again seen. There is further effacement of the sulci consistent with diffuse mass effect. Cerebellar tonsils remain above the foramen magnum. Vascular: Atherosclerotic calcifications are present within the cavernous internal carotid arteries. There is no hyperdense vessel. Skull: Calvarium is intact. No focal lytic or blastic lesions are present. Sinuses/Orbits: Nasal septum is absent. Mild mucosal thickening is present within the maxillary sinuses and scattered through the remaining ethmoid air cells. The paranasal sinuses and mastoid air cells are otherwise clear. The globes and orbits are within normal limits. CT CERVICAL SPINE FINDINGS Alignment: There is straightening of the normal cervical lordosis. Slight anterolisthesis present at C3-4. No other significant listhesis is present. Skull base and vertebrae: Craniocervical junction is normal. Vertebral body heights are maintained. No acute or healing fractures are present. Soft tissues and spinal canal: Extra-axial hemorrhage is noted at the foramen magnum. This does not extend inferiorly within the  canal. Soft tissues the neck demonstrate atherosclerotic calcifications on the left. OG tube and endotracheal tube are in place. Disc levels: Asymmetric facet degenerative changes are noted on the right at C3-4. Uncovertebral spurring contributes to left foraminal narrowing at C5-6 and mild right foraminal narrowing at C6-7. Upper chest: The lung apices are clear. IMPRESSION: 1. Progression of intracranial hemorrhage extending now cephalad along the right cortical spinal tracts to the level of the corona radiata. 2. Increasing intracranial mass effect. Early midline shift is present at the level of the thalami. There is further effacement of the sulci. 3. Dilating lateral ventricles consistent with developing hydrocephalus. 4. Degenerative changes in the cervical spine without acute abnormality. Critical Value/emergent results were called by telephone at the time of interpretation on 10/30/2018 at 10:43 am to [facility], who verbally acknowledged these results. Electronically Signed   By: San Morelle M.D.   On: 11/01/2018 10:50   Dg Chest  Portable 1 View  Result Date: 10/14/2018 CLINICAL DATA:  Respiratory failure EXAM: PORTABLE CHEST 1 VIEW COMPARISON:  01/22/2018 FINDINGS: Endotracheal tube terminates approximately 2.5 cm superior to the carina. Enteric tube courses below the diaphragm with distal tip and side hole beyond the inferior margin of the film. The heart size and mediastinal contours are within normal limits. Both lungs are clear. The visualized skeletal structures are unremarkable. IMPRESSION: Satisfactory positioning of endotracheal and enteric tubes. Lungs are clear. Electronically Signed   By: Davina Poke M.D.   On: 10/24/2018 09:55   Ct Head Code Stroke Wo Contrast  Result Date: 11/06/2018 CLINICAL DATA:  Code stroke. Altered mental status. Last seen normal 0500 hours EXAM: CT HEAD WITHOUT CONTRAST TECHNIQUE: Contiguous axial images were obtained from the base of the  skull through the vertex without intravenous contrast. COMPARISON:  MRI 04/03/2015.  Head CT 02/15/2015. FINDINGS: Brain: Acute pontine hemorrhage with extension into the right perimesencephalic cistern and fourth ventricle. Cerebral hemispheres show chronic small-vessel ischemic change of the white matter and old infarction in the left basal ganglia and external capsule region. There is also old infarction in the left midbrain and cerebral peduncle. Question newly seen low-density in the left occipital lobe which could represent infarction or edema. Some potential that the low-density in the left lateral basal ganglia and external capsule could be recent. Vascular: No abnormal vessel finding. Skull: Negative Sinuses/Orbits: Clear/normal Other: None ASPECTS ([place] Stroke Program Early CT Score) Not applicable in this setting. IMPRESSION: 1. Acute pontine hemorrhage with extension into the perimesencephalic cisterns on the right and into the fourth ventricle. 2. Old small vessel infarctions. Possible recent infarction in the right occipital lobe and left lateral basal ganglia/external capsule. 3. These results were communicated to Dr. Cheral Marker at 9:08 amon 09/29/2020by text page via the [facility] messaging system. Electronically Signed   By: Nelson Chimes M.D.   On: 10/31/2018 09:12    PHYSICAL EXAM  Temp:  [95.1 F (35.1 C)-100 F (37.8 C)] 99.5 F (37.5 C) (09/23 1730) Pulse Rate:  [72-121] 87 (09/23 1745) Resp:  [9-23] 16 (09/23 1745) BP: (72-194)/(54-95) 118/79 (09/23 1745) SpO2:  [94 %-100 %] 96 % (09/23 1745) FiO2 (%):  [40 %] 40 % (09/23 1520)  General - Well nourished, well developed, intubated without sedation.  Ophthalmologic - fundi not visualized due to noncooperation.  Cardiovascular - Regular rate and rhythm.  Neuro - intubated without sedation, eyes closed, not following commands. With forced eye opening, eyes in mid position, not blinking to visual threat, doll's eyes absent, not  tracking, pupils 64mm fixed without pupillary reflexes. Corneal reflex absent, gag and cough absent. Breathing over the vent once vent rate turned down to 10.  Facial symmetry not able to test due to ET tube. On pain stimulation, no movement of all extremities. DTR diminished and no babinski. Sensation, coordination and gait not tested.   ASSESSMENT/PLAN [name] is a 53 y.o. female with history of HTN, HLD, CAD, COPD, prior stroke, tobacco abuse who attends the Methadone clinic  presenting with decreased responsiveness, progressing to unresponsiveness en route.   ICH: Hypertensive Pontine ICH w/ IVH, cytotoxic cerebral edema and mass effect, progressive since admission now with obstructive hydrocephalus  NSG consult. No role. fatal hemorrhage.    Code Stroke CT head 9/22 0901 acute pontine hemorrhage w/ IVH and blood in 4th ventricle. Old Small vessel disease. Possible subacute R occipital and L lateral basal ganglia/external capsule infarct.    Repeat CT head  9/22 1021 progression ICH into R cortical spinal tracts w/ edema and mass effect. Early thalamic midline shift. Effacement of sulci. Dilating lateral ventricles c/w hydrocephalus.  CT C Spine degenerative, no fx  CT head 9/23 0143 continued interval progression pontine ICH w/ extention R thalamus and corona radiata. Worsened edema and mass effect, midline shift at thalamus. IVH in 3rd, 4th and lateral ventricles w/ worsened obstructive hydrocephalus. Progressive loss of cortical sulcation throughout.    2D Echo EF 70-75%  LDL 100  HgbA1c 6.6  UDS neg  SCDs for VTE prophylaxis  aspirin 325 mg daily prior to admission, now on No antithrombotic given hemorrhage  Code status - DNR   Patient critically ill. Hemorrhage is not survivable. discussed with son, he is in agreement with DNR  Cerebral edema, obstructive hydrocephalus  R pupil blown, projectile vomiting overnight  ICH extension confirmed on CT  Started on  3% protocol  Na 145->153  Goal 150-155  Exam consistent with near brain death  Acute Respiratory Failure  Secondary to [place]  Intubated in ED  CCM on board  On vent  Clinically near brain death  Hypotension, Tachycardia  Secondary to herniation syndrome  On phenylephrine gtt to maintain BP  Hypertensive Emergency  BP as high as 270/170 on arrival . Home meds: amlodipine, labetalol and hydralazine  . SBP goal < 140 . Treated in ED w/ cleviprex drip, prn labetalol and hydralazine  Hyperlipidemia  Home meds:  lipitor 20  LDL 100  Statin on hold given hemorrhage  Hyperglycemia, likely undiagnosed Diabetes  HgbA1c 6.6, goal < 7.0  SSI  CBG monitoring  Dysphagia . Secondary to stroke . NPO  Other Stroke Risk Factors  Cigarette smoker  Hx polysubstance abuse. Attends the Methadone clinic. Currently UDS neg  Overweight, Body mass index is 27.4 kg/m., recommend weight loss, diet and exercise as appropriate   Hx stroke/TIA  08/2014 L brain subcortical infarct d/t small vessel disease.    Coronary artery disease s/p NSTEMI, PCI  Other Active Problems  Acute on chronic kidney disease GFR 24, Cre 2.51  Hypokalemia 3.0  Hospital day # 1  This patient is critically ill due to extensive pontine ICH with hydrocephalus, hypertensive emergency, unresponsive and at significant risk of neurological worsening, death form brain herniation, cerebral edema, brain death, seizure, obstructive hydrocephalus. This patient's care requires constant monitoring of vital signs, hemodynamics, respiratory and cardiac monitoring, review of multiple databases, neurological assessment, discussion with family, other specialists and medical decision making of high complexity. I spent 35 minutes of neurocritical care time in the care of this patient. I had long discussion with son at bedside, updated pt current condition, treatment plan and poor prognosis, and answered all the  questions.  He expressed understanding and appreciation.  He requested to allow patient fianc to visit her in the afternoon before making decisions.  Rosalin Hawking, MD PhD Stroke Neurology 11/01/2018 6:24 PM   To contact Stroke Continuity provider, please refer to [email or web address] After hours, contact General Neurology

## 2018-11-01 NOTE — Progress Notes (Signed)
SLP Cancellation Note  Patient Details Name: Karla Nelson MRN: [id number] DOB: [birth year]   Cancelled treatment:       Reason Eval/Treat Not Completed: Medical issues which prohibited therapy (on vent). Will continue to follow.   Venita Sheffield Lain Tetterton 11/01/2018, 7:17 AM  Pollyann Glen, M.A. [place] Acute Environmental education officer [phone] Office [phone]

## 2018-11-01 NOTE — Progress Notes (Signed)
Patient transported from 4N31 to CT and back with no complications.

## 2018-11-01 NOTE — Progress Notes (Signed)
Wasted 180 ml of Fentanyl 42mcg/ml in the hazardous waste container.  Eber Hong, RN was a witness. Leeta Grimme C 11:51 AM

## 2018-11-01 NOTE — Progress Notes (Signed)
9/23 17:45 MRI called RN for update on pt. RN stated per doctors orders to cancel MRI due to patient being too unstable.

## 2018-11-01 NOTE — Progress Notes (Signed)
  Echocardiogram 2D Echocardiogram has been performed.  Karla Nelson 11/01/2018, 9:28 AM

## 2018-11-02 ENCOUNTER — Inpatient Hospital Stay (HOSPITAL_COMMUNITY): Payer: 59

## 2018-11-02 ENCOUNTER — Other Ambulatory Visit: Payer: Self-pay

## 2018-11-02 DIAGNOSIS — J9600 Acute respiratory failure, unspecified whether with hypoxia or hypercapnia: Secondary | ICD-10-CM

## 2018-11-02 DIAGNOSIS — R739 Hyperglycemia, unspecified: Secondary | ICD-10-CM

## 2018-11-02 DIAGNOSIS — E232 Diabetes insipidus: Secondary | ICD-10-CM

## 2018-11-02 DIAGNOSIS — G936 Cerebral edema: Secondary | ICD-10-CM

## 2018-11-02 DIAGNOSIS — D72829 Elevated white blood cell count, unspecified: Secondary | ICD-10-CM

## 2018-11-02 DIAGNOSIS — G935 Compression of brain: Secondary | ICD-10-CM

## 2018-11-02 DIAGNOSIS — G911 Obstructive hydrocephalus: Secondary | ICD-10-CM

## 2018-11-02 LAB — BASIC METABOLIC PANEL
Anion gap: 8 (ref 5–15)
BUN: 61 mg/dL — ABNORMAL HIGH (ref 6–20)
CO2: 27 mmol/L (ref 22–32)
Calcium: 7.6 mg/dL — ABNORMAL LOW (ref 8.9–10.3)
Chloride: 127 mmol/L — ABNORMAL HIGH (ref 98–111)
Creatinine, Ser: 4.21 mg/dL — ABNORMAL HIGH (ref 0.44–1.00)
GFR calc Af Amer: 13 mL/min — ABNORMAL LOW (ref >60)
GFR calc non Af Amer: 11 mL/min — ABNORMAL LOW (ref >60)
Glucose, Bld: 122 mg/dL — ABNORMAL HIGH (ref 70–99)
Potassium: 4 mmol/L (ref 3.5–5.1)
Sodium: 162 mmol/L (ref 135–145)

## 2018-11-02 LAB — POCT I-STAT 7, (LYTES, BLD GAS, ICA,H+H)
Acid-Base Excess: 2 mmol/L (ref 0.0–2.0)
Acid-Base Excess: 3 mmol/L — ABNORMAL HIGH (ref 0.0–2.0)
Acid-Base Excess: 3 mmol/L — ABNORMAL HIGH (ref 0.0–2.0)
Bicarbonate: 27.9 mmol/L (ref 20.0–28.0)
Bicarbonate: 30.2 mmol/L — ABNORMAL HIGH (ref 20.0–28.0)
Bicarbonate: 32.1 mmol/L — ABNORMAL HIGH (ref 20.0–28.0)
Calcium, Ion: 1.15 mmol/L (ref 1.15–1.40)
Calcium, Ion: 1.17 mmol/L (ref 1.15–1.40)
Calcium, Ion: 1.18 mmol/L (ref 1.15–1.40)
HCT: 34 % — ABNORMAL LOW (ref 36.0–46.0)
HCT: 31 % — ABNORMAL LOW (ref 36.0–46.0)
HCT: 32 % — ABNORMAL LOW (ref 36.0–46.0)
Hemoglobin: 11.6 g/dL — ABNORMAL LOW (ref 12.0–15.0)
Hemoglobin: 10.5 g/dL — ABNORMAL LOW (ref 12.0–15.0)
Hemoglobin: 10.9 g/dL — ABNORMAL LOW (ref 12.0–15.0)
O2 Saturation: 96 %
O2 Saturation: 100 %
O2 Saturation: 100 %
Patient temperature: 97.7
Patient temperature: 101.4
Patient temperature: 101.4
Potassium: 3.7 mmol/L (ref 3.5–5.1)
Potassium: 4 mmol/L (ref 3.5–5.1)
Potassium: 4 mmol/L (ref 3.5–5.1)
Sodium: 161 mmol/L (ref 135–145)
Sodium: 162 mmol/L (ref 135–145)
Sodium: 163 mmol/L (ref 135–145)
TCO2: 29 mmol/L (ref 22–32)
TCO2: 32 mmol/L (ref 22–32)
TCO2: 34 mmol/L — ABNORMAL HIGH (ref 22–32)
pCO2 arterial: 46.2 mmHg (ref 32.0–48.0)
pCO2 arterial: 59.7 mmHg — ABNORMAL HIGH (ref 32.0–48.0)
pCO2 arterial: 80.5 mmHg (ref 32.0–48.0)
pH, Arterial: 7.386 (ref 7.350–7.450)
pH, Arterial: 7.217 — ABNORMAL LOW (ref 7.350–7.450)
pH, Arterial: 7.319 — ABNORMAL LOW (ref 7.350–7.450)
pO2, Arterial: 81 mmHg — ABNORMAL LOW (ref 83.0–108.0)
pO2, Arterial: 365 mmHg — ABNORMAL HIGH (ref 83.0–108.0)
pO2, Arterial: 400 mmHg — ABNORMAL HIGH (ref 83.0–108.0)

## 2018-11-02 LAB — COMPREHENSIVE METABOLIC PANEL
ALT: 18 U/L (ref 0–44)
ALT: 16 U/L (ref 0–44)
AST: 47 U/L — ABNORMAL HIGH (ref 15–41)
AST: 32 U/L (ref 15–41)
Albumin: 2.2 g/dL — ABNORMAL LOW (ref 3.5–5.0)
Albumin: 2 g/dL — ABNORMAL LOW (ref 3.5–5.0)
Alkaline Phosphatase: 65 U/L (ref 38–126)
Alkaline Phosphatase: 62 U/L (ref 38–126)
Anion gap: 11 (ref 5–15)
Anion gap: 8 (ref 5–15)
BUN: 58 mg/dL — ABNORMAL HIGH (ref 6–20)
BUN: 59 mg/dL — ABNORMAL HIGH (ref 6–20)
CO2: 26 mmol/L (ref 22–32)
CO2: 25 mmol/L (ref 22–32)
Calcium: 7.8 mg/dL — ABNORMAL LOW (ref 8.9–10.3)
Calcium: 8 mg/dL — ABNORMAL LOW (ref 8.9–10.3)
Chloride: 124 mmol/L — ABNORMAL HIGH (ref 98–111)
Chloride: 126 mmol/L — ABNORMAL HIGH (ref 98–111)
Creatinine, Ser: 4.04 mg/dL — ABNORMAL HIGH (ref 0.44–1.00)
Creatinine, Ser: 4.22 mg/dL — ABNORMAL HIGH (ref 0.44–1.00)
GFR calc Af Amer: 14 mL/min — ABNORMAL LOW (ref >60)
GFR calc Af Amer: 13 mL/min — ABNORMAL LOW (ref >60)
GFR calc non Af Amer: 12 mL/min — ABNORMAL LOW (ref >60)
GFR calc non Af Amer: 11 mL/min — ABNORMAL LOW (ref >60)
Glucose, Bld: 168 mg/dL — ABNORMAL HIGH (ref 70–99)
Glucose, Bld: 160 mg/dL — ABNORMAL HIGH (ref 70–99)
Potassium: 3.8 mmol/L (ref 3.5–5.1)
Potassium: 3.9 mmol/L (ref 3.5–5.1)
Sodium: 161 mmol/L (ref 135–145)
Sodium: 159 mmol/L — ABNORMAL HIGH (ref 135–145)
Total Bilirubin: 0.5 mg/dL (ref 0.3–1.2)
Total Bilirubin: 0.5 mg/dL (ref 0.3–1.2)
Total Protein: 5.3 g/dL — ABNORMAL LOW (ref 6.5–8.1)
Total Protein: 4.9 g/dL — ABNORMAL LOW (ref 6.5–8.1)

## 2018-11-02 LAB — URINALYSIS, ROUTINE W REFLEX MICROSCOPIC
Bilirubin Urine: NEGATIVE
Glucose, UA: NEGATIVE mg/dL
Ketones, ur: NEGATIVE mg/dL
Nitrite: NEGATIVE
Protein, ur: NEGATIVE mg/dL
Specific Gravity, Urine: 1.011 (ref 1.005–1.030)
pH: 5 (ref 5.0–8.0)

## 2018-11-02 LAB — CBC WITH DIFFERENTIAL/PLATELET
Abs Immature Granulocytes: 0.06 10*3/uL (ref 0.00–0.07)
Basophils Absolute: 0 10*3/uL (ref 0.0–0.1)
Basophils Relative: 0 %
Eosinophils Absolute: 0 10*3/uL (ref 0.0–0.5)
Eosinophils Relative: 0 %
HCT: 37.7 % (ref 36.0–46.0)
Hemoglobin: 12 g/dL (ref 12.0–15.0)
Immature Granulocytes: 0 %
Lymphocytes Relative: 9 %
Lymphs Abs: 1.2 10*3/uL (ref 0.7–4.0)
MCH: 29.1 pg (ref 26.0–34.0)
MCHC: 31.8 g/dL (ref 30.0–36.0)
MCV: 91.3 fL (ref 80.0–100.0)
Monocytes Absolute: 0.9 10*3/uL (ref 0.1–1.0)
Monocytes Relative: 7 %
Neutro Abs: 11.7 10*3/uL — ABNORMAL HIGH (ref 1.7–7.7)
Neutrophils Relative %: 84 %
Platelets: 215 10*3/uL (ref 150–400)
RBC: 4.13 MIL/uL (ref 3.87–5.11)
RDW: 15.8 % — ABNORMAL HIGH (ref 11.5–15.5)
WBC: 14 10*3/uL — ABNORMAL HIGH (ref 4.0–10.5)
nRBC: 0 % (ref 0.0–0.2)

## 2018-11-02 LAB — SARS CORONAVIRUS 2 (TAT 6-24 HRS): SARS Coronavirus 2: NEGATIVE

## 2018-11-02 LAB — PROTIME-INR
INR: 1.3 — ABNORMAL HIGH (ref 0.8–1.2)
Prothrombin Time: 16.1 seconds — ABNORMAL HIGH (ref 11.4–15.2)

## 2018-11-02 LAB — PHOSPHORUS: Phosphorus: 4.8 mg/dL — ABNORMAL HIGH (ref 2.5–4.6)

## 2018-11-02 LAB — APTT
aPTT: 32 seconds (ref 24–36)
aPTT: 30 seconds (ref 24–36)

## 2018-11-02 LAB — MAGNESIUM: Magnesium: 2.4 mg/dL (ref 1.7–2.4)

## 2018-11-02 MED ORDER — TECHNETIUM TC 99M EXAMETAZIME IV KIT
19.3000 | PACK | Freq: Once | INTRAVENOUS | Status: AC | PRN
  Administered 2018-11-02: 14:00:00 19.3 via INTRAVENOUS

## 2018-11-02 MED ORDER — LACTATED RINGERS IV BOLUS
1000.0000 mL | Freq: Once | INTRAVENOUS | Status: AC
  Administered 2018-11-02: 13:00:00 1000 mL via INTRAVENOUS

## 2018-11-02 MED ORDER — SODIUM CHLORIDE 0.45 % IV SOLN
INTRAVENOUS | Status: DC
  Administered 2018-11-02 (×2): via INTRAVENOUS

## 2018-11-03 LAB — URINE CULTURE: Culture: <10000 — AB

## 2018-11-06 LAB — CULTURE, BLOOD (ROUTINE X 2)
Culture: NO GROWTH
Culture: NO GROWTH
Special Requests: ADEQUATE
Special Requests: ADEQUATE

## 2018-11-09 NOTE — Significant Event (Signed)
Declaration of death by neurological criteria:  Diagnosis: hypertensive pontine hemorrhage with cerebral edema and obstructive hydrocephalus.  Imaging: CT head 9/23 shows extensive brainstem clot with surrounding edema and increase in size of ventricles  Confounding factors: hypernatremia of 161.   Clinical examination:   Response to painful stimulation to 4 limbs: none  Response to painful central stimuli: none  Pupillary response: fixed and dilated.   Corneal response: absent  Oculocephalic response: negative  Vestibulo-occular response: absent  Gag reflex: absent  Cough reflex: absent.   Apnea testing: no respiratory effort at 61min, no hemodynamic instability.   Blood gas results: Pre ABG 1044:  7.31/59.7/365 Post AB G 1101: 7.22/80.5/400  Confirmatory testing: Nuclear cerebral perfusion scan shows absence of cerebral perfusion consistent with whole brain death.   The patient was declared dead by neurological criteria at 1530. I notified the son.  [place] donor services was notified.  Kipp Brood, MD [facility] ICU Physician [place]  Pager: [phone] Mobile: [phone] After hours: [phone].  11-22-2018, 11:07 AM

## 2018-11-09 NOTE — Progress Notes (Signed)
Dr. Rory Percy notified of sodium of 161. Hypertonic saline is not running. No new orders received. Will continue to monitor.

## 2018-11-09 NOTE — Progress Notes (Signed)
NAME:  Karla Nelson, MRN:  [id number], DOB:  1965/07/14, LOS: 2 ADMISSION DATE:  10/19/2018, CONSULTATION DATE:  11-19-18 REFERRING MD:  Erlinda Hong - [facility] , CHIEF COMPLAINT:  ICH   HPI/course in hospital  53 year old woman with massive pontine hemorrhage. Comatose. 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  CDS involved and family is in agreement with possible organ procurement.  Past Medical History   Past Medical History:  Diagnosis Date  . Asthma   . CAD (coronary artery disease)    NSTEMI with cath in August 2012 with nonobstructive disease, 50% ostial D1 and 70% distal LCX and PL disease. Normal EF. Has diastolic dysfunction with elevated EDP  . Cleft palate    special denture covers defect like a C, sleeps with device in place  . COPD (chronic obstructive pulmonary disease) (HCC)    o2 and bedtime   . DYSPNEA ON EXERTION 03/08/2007  . ELECTROCARDIOGRAM, ABNORMAL 02/13/2010  . History of oxygen administration    bedtime  . HTN (hypertension)    Has normal renal arteries.  . Hyperlipidemia   . NSTEMI (non-ST elevated myocardial infarction) ([place]) 09-25-2010  . Renal disorder   . Stroke [facility])    july 2016, Dr Donnella Sham  . Tobacco abuse disorder      Past Surgical History:  Procedure Laterality Date  . CARDIAC CATHETERIZATION  Aug 2012   Moderate nonobstructive multivessel CAD with an EF of 70 to 75%  . CESAREAN SECTION    . CORONARY ANGIOPLASTY     no further intervention last cardiac visit greater than a year     Interim history/subjective:  No response to stimulation. Temperature instability overnight. Patient is no longer hypertensive and is now requiring vasopressors for BP support.  Objective   Blood pressure (!) 100/55, pulse (!) 114, temperature 98.9 F (37.2 C), temperature source Axillary, resp. rate 20, height 5\' 2"  (1.575 m), weight 68.2 kg, last menstrual period 09/09/2014, SpO2 100 %.    Vent Mode: PRVC FiO2 (%):  [40 %-100 %] 40 % Set Rate:  [16 bmp-20  bmp] 20 bmp Vt Set:  [400 mL] 400 mL PEEP:  [5 cmH20] 5 cmH20 Plateau Pressure:  [14 cmH20-15 cmH20] 15 cmH20   Intake/Output Summary (Last 24 hours) at 19-Nov-2018 1114 Last data filed at 2018/11/19 1100 Gross per 24 hour  Intake 2351.13 ml  Output 2665 ml  Net -313.87 ml   Filed Weights   19-Nov-2018 0800  Weight: 68.2 kg    Examination: Physical Exam Constitutional:      General: She is not in acute distress.    Appearance: Normal appearance.  HENT:     Head: Normocephalic and atraumatic.     Mouth/Throat:     Mouth: Mucous membranes are dry.  Eyes:     Comments: Pupils fixed and dilated.  Neck:     Musculoskeletal: Neck supple.  Cardiovascular:     Rate and Rhythm: Normal rate and regular rhythm.     Pulses: Normal pulses.     Heart sounds: Normal heart sounds.  Pulmonary:     Effort: No respiratory distress.     Breath sounds: No wheezing, rhonchi or rales.     Comments: No spontaneous respiratory effort. Abdominal:     General: Abdomen is flat. Bowel sounds are normal. There is no distension.  Musculoskeletal:        General: No swelling.  Skin:    General: Skin is warm and dry.     Capillary Refill:  Capillary refill takes 2 to 3 seconds.  Neurological:     Cranial Nerves: Cranial nerve deficit present.     Comments: Comatose with no response to painful stimuli.      Ancillary tests (personally reviewed)  CBC: Recent Labs  Lab 10/10/2018 0855  11/01/18 1014 2018/11/20 0336 11/20/18 0338 Nov 20, 2018 1048 11-20-18 1105  WBC 8.2  --  14.6*  --  14.0*  --   --   NEUTROABS 3.4  --   --   --  11.7*  --   --   HGB 14.2   < > 14.2 11.6* 12.0 10.5* 10.9*  HCT 45.2   < > 44.5 34.0* 37.7 31.0* 32.0*  MCV 90.2  --  89.0  --  91.3  --   --   PLT 317  --  275  --  215  --   --    < > = values in this interval not displayed.    Basic Metabolic Panel: Recent Labs  Lab 10/27/2018 0855 10/20/2018 0904  11/01/18 1014 11/01/18 1614 11/20/18 0336 11-20-2018 0338 2018-11-20  1048 November 20, 2018 1105  NA 139 141   < > 153* 157* 161* 161* 162* 163*  K 3.1* 3.0*   < > 3.9  --  3.7 3.8 4.0 4.0  CL 102 100  --  115*  --   --  124*  --   --   CO2 24  --   --  26  --   --  26  --   --   GLUCOSE 203* 196*  --  209*  --   --  168*  --   --   BUN 38* 39*  --  54*  --   --  58*  --   --   CREATININE 2.51* 2.40*  --  3.96*  --   --  4.04*  --   --   CALCIUM 8.6*  --   --  7.7*  --   --  7.8*  --   --   MG  --   --   --  2.3  --   --  2.4  --   --   PHOS  --   --   --   --   --   --  4.8*  --   --    < > = values in this interval not displayed.   GFR: Estimated Creatinine Clearance: 14.2 mL/min (A) (by C-G formula based on SCr of 4.04 mg/dL (H)). Recent Labs  Lab 10/26/2018 0855 11/01/18 1014 2018-11-20 0338  WBC 8.2 14.6* 14.0*    Liver Function Tests: Recent Labs  Lab 10/22/2018 0855 11/01/18 2142 20-Nov-2018 0338  AST 23 62* 47*  ALT 19 19 18   ALKPHOS 87 70 65  BILITOT 0.3 0.5 0.5  PROT 6.9 5.9* 5.3*  ALBUMIN 3.3* 2.4* 2.2*   No results for input(s): LIPASE, AMYLASE in the last 168 hours. No results for input(s): AMMONIA in the last 168 hours.  ABG    Component Value Date/Time   PHART 7.217 (L) 11-20-2018 1105   PCO2ART 80.5 (HH) 11/20/2018 1105   PO2ART 400.0 (H) 2018-11-20 1105   HCO3 32.1 (H) 20-Nov-2018 1105   TCO2 34 (H) 11-20-2018 1105   O2SAT 100.0 11/20/2018 1105     Coagulation Profile: Recent Labs  Lab 10/10/2018 0855 11/01/18 2142  INR 1.0 1.2    Cardiac Enzymes: No results for input(s): CKTOTAL, CKMB, CKMBINDEX, TROPONINI in the  last 168 hours.  HbA1C: Hemoglobin A1C  Date/Time Value Ref Range Status  07/28/2017 10:13 AM 5.9 (A) 4.0 - 5.6 % Final  04/22/2017 02:55 PM 6.3  Final   Hgb A1c MFr Bld  Date/Time Value Ref Range Status  10/30/2018 09:10 AM 6.6 (H) 4.8 - 5.6 % Final    Comment:    (NOTE)         Prediabetes: 5.7 - 6.4         Diabetes: >6.4         Glycemic control for adults with diabetes: <7.0   09/01/2014 07:40 AM  6.9 (H) 4.8 - 5.6 % Final    Comment:    (NOTE)         Pre-diabetes: 5.7 - 6.4         Diabetes: >6.4         Glycemic control for adults with diabetes: <7.0     CBG: Recent Labs  Lab 10/14/2018 0857 11/01/18 1617  GLUCAP 198* 151*     Assessment & Plan:  Critically ill due to respiratory failure requiring mechanical ventilation.  Continue full ventilatory support  Critically ill due to coma from catastrophic pontine hemorrhage. Examination is consistent with progression to brain death.  Please see Death by Neurologic Criteria note. Will proceed to ancillary testing to confirm whole brain death in context of mild hypernatremia and relatively preserved cortical architecture on CT.  Critically ill due hypotension due to autonomic failure related to brain death requiring titration of phenylephrine. Continue to support BP to keep MAP>65  Best practice:  Diet: NPO Pain/Anxiety/Delirium protocol (if indicated): n/a VAP protocol (if indicated): bundle in place. DVT prophylaxis: SCD's GI prophylaxis: n/a Urinary catheter: Assessment of intravascular volume Glucose control: Monitoring Mobility: Bedrest  Code Status: DNR Family Communication: patient family informed of condition by Neurology Disposition: For possible [facility] declaration and organ donation.   CRITICAL CARE Performed by: Kipp Brood   Total critical care time: 40 minutes  Critical care time was exclusive of separately billable procedures and treating other patients.  Critical care was necessary to treat or prevent imminent or life-threatening deterioration.  Critical care was time spent personally by me on the following activities: development of treatment plan with patient and/or surrogate as well as nursing, discussions with consultants, evaluation of patient's response to treatment, examination of patient, obtaining history from patient or surrogate, ordering and performing treatments and interventions,  ordering and review of laboratory studies, ordering and review of radiographic studies, pulse oximetry, re-evaluation of patient's condition and participation in multidisciplinary rounds.  Kipp Brood, MD [facility] ICU Physician [place]  Pager: [phone] Mobile: [phone] After hours: [phone].   11/29/2018, 11:14 AM

## 2018-11-09 NOTE — Progress Notes (Signed)
CRITICAL VALUE ALERT  Critical Value:  Sodium 162  Date & Time Notied:  11-27-2018 1156  Provider Notified: Dr Lynetta Mare  Orders Received/Actions taken: No new orders at this time

## 2018-11-09 NOTE — Progress Notes (Signed)
STROKE TEAM PROGRESS NOTE   INTERVAL HISTORY Pt still intubated, remains unresponsive. No brainstem reflex. Consistent with clinical brain death. BP on the low side, now on neo for BP management. Na 161. Will need ancillary test to confirm brain death.   Vitals:   2018-11-06 0731 2018/11/06 0800 11/06/18 0900 Nov 06, 2018 1000  BP: (!) 113/57 100/88 103/66 110/73  Pulse: 94 100 98 (!) 103  Resp: 16 16 16 16   Temp:  98.9 F (37.2 C)    TempSrc:  Axillary    SpO2: 100% 100% 100% 100%  Weight:  68.2 kg    Height:  5\' 2"  (1.575 m)      CBC:  Recent Labs  Lab 10/25/2018 0855  11/01/18 1014 2018-11-06 0336 06-Nov-2018 0338  WBC 8.2  --  14.6*  --  14.0*  NEUTROABS 3.4  --   --   --  11.7*  HGB 14.2   < > 14.2 11.6* 12.0  HCT 45.2   < > 44.5 34.0* 37.7  MCV 90.2  --  89.0  --  91.3  PLT 317  --  275  --  215   < > = values in this interval not displayed.    Basic Metabolic Panel:  Recent Labs  Lab 11/01/18 1014  11/06/2018 0336 06-Nov-2018 0338  NA 153*   < > 161* 161*  K 3.9  --  3.7 3.8  CL 115*  --   --  124*  CO2 26  --   --  26  GLUCOSE 209*  --   --  168*  BUN 54*  --   --  58*  CREATININE 3.96*  --   --  4.04*  CALCIUM 7.7*  --   --  7.8*  MG 2.3  --   --  2.4  PHOS  --   --   --  4.8*   < > = values in this interval not displayed.   Lipid Panel:     Component Value Date/Time   CHOL 176 10/22/2018 0910   CHOL 151 04/22/2017 1457   TRIG 138 10/12/2018 0910   HDL 48 10/12/2018 0910   HDL 43 04/22/2017 1457   CHOLHDL 3.7 10/23/2018 0910   VLDL 28 10/12/2018 0910   LDLCALC 100 (H) 11/03/2018 0910   LDLCALC 84 04/22/2017 1457   HgbA1c:  Lab Results  Component Value Date   HGBA1C 6.6 (H) 10/27/2018   Urine Drug Screen:     Component Value Date/Time   LABOPIA NONE DETECTED 11/01/2018 1600   COCAINSCRNUR NONE DETECTED 11/01/2018 1600   LABBENZ NONE DETECTED 11/01/2018 1600   AMPHETMU NONE DETECTED 11/01/2018 1600   THCU NONE DETECTED 11/01/2018 1600   LABBARB NONE  DETECTED 11/01/2018 1600    Alcohol Level     Component Value Date/Time   [facility] <10 11/01/2018 0910    IMAGING Ct Abdomen Pelvis Wo Contrast  Result Date: 2018/11/06 CLINICAL DATA:  53 year old female with respiratory failure. EXAM: CT CHEST, ABDOMEN AND PELVIS WITHOUT CONTRAST TECHNIQUE: Multidetector CT imaging of the chest, abdomen and pelvis was performed following the standard protocol without IV contrast. COMPARISON:  Chest radiograph dated 10/29/2018. FINDINGS: Evaluation of this exam is limited in the absence of intravenous contrast. CT CHEST FINDINGS Cardiovascular: Top-normal cardiac size. There is a pericardial effusion measuring up to 14 mm posterior to the heart. Coronary vascular calcifications noted. There is mild atherosclerotic calcification of the thoracic aorta. The thoracic aorta and central pulmonary arteries are otherwise  grossly unremarkable on this noncontrast CT. Mediastinum/Nodes: No hilar or mediastinal adenopathy. An enteric tube is noted in the esophagus. No mediastinal fluid collection. Lungs/Pleura: Trace trace bilateral pleural effusions. Bibasilar linear and streaky densities may represent atelectasis or pneumonia. Clinical correlation is recommended. There is a cluster of nodular density in the superior segment of the left lower lobe with tree-in-bud appearance most consistent with an inflammatory/infectious etiology. There is no pneumothorax. An endotracheal tube is noted with tip approximately 17 mm above the carina. The central airways remain patent. Musculoskeletal: No chest wall mass or suspicious bone lesions identified. CT ABDOMEN PELVIS FINDINGS No intra-abdominal free air. Small free fluid within the pelvis. Hepatobiliary: The liver is grossly unremarkable. Probable small sludge in the gallbladder. No pericholecystic fluid. Pancreas: Unremarkable. No pancreatic ductal dilatation or surrounding inflammatory changes. Spleen: Normal in size without focal abnormality.  Adrenals/Urinary Tract: The adrenal glands are unremarkable. The kidneys, visualized ureters, and urinary bladder appear unremarkable. Stomach/Bowel: An enteric tube with tip in the region of the duodenal bulb. There is no bowel obstruction or active inflammation. Dense stool noted throughout the colon and in the rectum. The appendix is normal. Vascular/Lymphatic: Mild aortoiliac atherosclerotic disease. The IVC is grossly unremarkable. No portal venous gas. There is no adenopathy. Reproductive: The uterus and ovaries are grossly unremarkable. No pelvic mass. Other: Midline vertical anterior abdominal wall incisional scar. Musculoskeletal: Degenerative changes most prominent at L5-S1 with disc desiccation and vacuum phenomena. No acute osseous pathology. IMPRESSION: 1. Small bilateral pleural effusions with bibasilar atelectasis or pneumonia. A cluster of nodular density in the superior segment of the left lower lobe with tree-in-bud appearance is most consistent with an inflammatory/infectious process. Clinical correlation is recommended. 2. Small pericardial effusion. 3. No acute intra-abdominal or pelvic pathology. 4. Constipation. No bowel obstruction or active inflammation. Normal appendix. Aortic Atherosclerosis (ICD10-I70.0). Electronically Signed   By: Anner Crete M.D.   On: 11-26-2018 00:08   Ct Head Wo Contrast  Result Date: 11/01/2018 CLINICAL DATA:  Follow-up examination for intracerebral hemorrhage. EXAM: CT HEAD WITHOUT CONTRAST TECHNIQUE: Contiguous axial images were obtained from the base of the skull through the vertex without intravenous contrast. COMPARISON:  Prior CT from 11/06/2018. FINDINGS: Brain: There has been continued interval progression of previously identified intraparenchymal hemorrhage centered at the pons, again seen extending cephalad into the midbrain and along the right cortical spinal tract into the right thalamus and corona radiata. Localized edema and regional mass  effect has increased. Hemorrhage seen extending into the fourth ventricle, with extension into the cerebral aqueduct and third ventricle, with small volume hemorrhage now seen layering within the occipital horns of both lateral ventricles. Increased ventricular dilatation including temporal horn dilatation compatible with obstructive hydrocephalus. Mild localized right-to-left shift at the level of the third ventricle/cerebral aqueduct. Suggestion of loss of cortical sulcation elsewhere within the brain, suggesting continued developing edema. Cerebellar tonsils remain above the foramen magnum. Underlying cerebral white matter disease again noted. No extra-axial fluid collection. Vascular: No asymmetric hyperdense vessel. Skull: Scalp soft tissues and calvarium within normal limits. Sinuses/Orbits: Globes and orbital soft tissues demonstrate no acute finding. Absence of the nasal septum again noted. Chronic mucoperiosteal thickening within the ethmoidal air cells and maxillary sinuses again noted. Mastoid air cells remain clear. Other: None. IMPRESSION: 1. Continued interval progression of intraparenchymal hemorrhage centered at the pons with extension into the right thalamus and corona radiata. Localized edema and regional mass effect has worsened, with localized right-to-left midline shift at the level of the  thalamus. 2. Intraventricular extension with blood within the fourth, third, and lateral ventricles. Increased ventricular dilatation compatible with associated obstructive hydrocephalus, also worsened from previous. 3. Progressive loss of cortical sulcation elsewhere within the brain, consistent with continued developing cerebral edema. No frank transtentorial herniation at this time. 4. No other new acute intracranial abnormality. Critical Value/emergent results were called by telephone at the time of interpretation on 11/01/2018 at 2:16 am to providerDr. Rory Percy, who verbally acknowledged these results.  Electronically Signed   By: Jeannine Boga M.D.   On: 11/01/2018 02:18   Ct Chest Wo Contrast  Result Date: 11/18/2018 CLINICAL DATA:  53 year old female with respiratory failure. EXAM: CT CHEST, ABDOMEN AND PELVIS WITHOUT CONTRAST TECHNIQUE: Multidetector CT imaging of the chest, abdomen and pelvis was performed following the standard protocol without IV contrast. COMPARISON:  Chest radiograph dated 11/05/2018. FINDINGS: Evaluation of this exam is limited in the absence of intravenous contrast. CT CHEST FINDINGS Cardiovascular: Top-normal cardiac size. There is a pericardial effusion measuring up to 14 mm posterior to the heart. Coronary vascular calcifications noted. There is mild atherosclerotic calcification of the thoracic aorta. The thoracic aorta and central pulmonary arteries are otherwise grossly unremarkable on this noncontrast CT. Mediastinum/Nodes: No hilar or mediastinal adenopathy. An enteric tube is noted in the esophagus. No mediastinal fluid collection. Lungs/Pleura: Trace trace bilateral pleural effusions. Bibasilar linear and streaky densities may represent atelectasis or pneumonia. Clinical correlation is recommended. There is a cluster of nodular density in the superior segment of the left lower lobe with tree-in-bud appearance most consistent with an inflammatory/infectious etiology. There is no pneumothorax. An endotracheal tube is noted with tip approximately 17 mm above the carina. The central airways remain patent. Musculoskeletal: No chest wall mass or suspicious bone lesions identified. CT ABDOMEN PELVIS FINDINGS No intra-abdominal free air. Small free fluid within the pelvis. Hepatobiliary: The liver is grossly unremarkable. Probable small sludge in the gallbladder. No pericholecystic fluid. Pancreas: Unremarkable. No pancreatic ductal dilatation or surrounding inflammatory changes. Spleen: Normal in size without focal abnormality. Adrenals/Urinary Tract: The adrenal glands are  unremarkable. The kidneys, visualized ureters, and urinary bladder appear unremarkable. Stomach/Bowel: An enteric tube with tip in the region of the duodenal bulb. There is no bowel obstruction or active inflammation. Dense stool noted throughout the colon and in the rectum. The appendix is normal. Vascular/Lymphatic: Mild aortoiliac atherosclerotic disease. The IVC is grossly unremarkable. No portal venous gas. There is no adenopathy. Reproductive: The uterus and ovaries are grossly unremarkable. No pelvic mass. Other: Midline vertical anterior abdominal wall incisional scar. Musculoskeletal: Degenerative changes most prominent at L5-S1 with disc desiccation and vacuum phenomena. No acute osseous pathology. IMPRESSION: 1. Small bilateral pleural effusions with bibasilar atelectasis or pneumonia. A cluster of nodular density in the superior segment of the left lower lobe with tree-in-bud appearance is most consistent with an inflammatory/infectious process. Clinical correlation is recommended. 2. Small pericardial effusion. 3. No acute intra-abdominal or pelvic pathology. 4. Constipation. No bowel obstruction or active inflammation. Normal appendix. Aortic Atherosclerosis (ICD10-I70.0). Electronically Signed   By: Anner Crete M.D.   On: 11/18/18 00:08    PHYSICAL EXAM   Temp:  [94.4 F (34.7 C)-99.5 F (37.5 C)] 98.9 F (37.2 C) (09/24 0800) Pulse Rate:  [72-103] 103 (09/24 1000) Resp:  [12-18] 16 (09/24 1000) BP: (75-145)/(54-95) 110/73 (09/24 1000) SpO2:  [78 %-100 %] 100 % (09/24 1000) FiO2 (%):  [40 %-100 %] 70 % (09/24 0800) Weight:  [68.2 kg] 68.2 kg (09/24 0800)  General - Well nourished, well developed, intubated without sedation.  Ophthalmologic - fundi not visualized due to noncooperation.  Cardiovascular - Regular rate and rhythm.  Neuro - intubated without sedation, eyes closed, not following commands. With forced eye opening, eyes in mid position, not blinking to visual  threat, doll's eyes absent, not tracking, pupils 32mm fixed without pupillary reflexes. Corneal reflex absent, gag and cough absent. Not breathing over the vent.  Facial symmetry not able to test due to ET tube. On pain stimulation, no movement of all extremities. DTR diminished and no babinski. Sensation, coordination and gait not tested.   ASSESSMENT/PLAN [name] is a 53 y.o. female with history of HTN, HLD, CAD, COPD, prior stroke, tobacco abuse who attends the Methadone clinic  presenting with decreased responsiveness, progressing to unresponsiveness en route.   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 No role. fatal hemorrhage.    Code Stroke CT head 9/22 0901 acute pontine hemorrhage w/ IVH and blood in 4th ventricle. Old Small vessel disease. Possible subacute R occipital and L lateral basal ganglia/external capsule infarct.    Repeat CT head 9/22 1021 progression ICH into R cortical spinal tracts w/ edema and mass effect. Early thalamic midline shift. Effacement of sulci. Dilating lateral ventricles c/w hydrocephalus.  CT C Spine degenerative, no fx  CT head 9/23 0143 continued interval progression pontine ICH w/ extention R thalamus and corona radiata. Worsened edema and mass effect, midline shift at thalamus. IVH in 3rd, 4th and lateral ventricles w/ worsened obstructive hydrocephalus. Progressive loss of cortical sulcation throughout.    2D Echo EF 70-75%  LDL 100  HgbA1c 6.6  UDS neg  HIV neg  Pregnancy neg   SCDs for VTE prophylaxis  aspirin 325 mg daily prior to admission, now on No antithrombotic given hemorrhage  Code status - DNR    Family agreeable to withdrawal of care  Exam c/w brain death. For testing today. Anticipate xfer to [facility] for organ harvesting  Cerebral edema, obstructive hydrocephalus  R pupil blown, projectile vomiting 9/22 overnight  ICH extension confirmed on CT  Started on 3% protocol, now off  Na 145->153 -> 161 -> 161  Goal 150-155  Exam consistent with brain death  Acute Respiratory Failure  Secondary to [place]  Intubated in ED  CCM on board  On vent  Clinically brain death now  Hypotension, Tachycardia  Secondary to herniation syndrome  On phenylephrine gtt to maintain BP  Hypertensive Emergency  BP as high as 270/170 on arrival . Home meds: amlodipine, labetalol and hydralazine  . SBP goal < 140 . Treated in ED w/ cleviprex drip, now off . prn labetalol and hydralazine  Hyperlipidemia  Home meds:  lipitor 20  LDL 100  Statin on hold given hemorrhage  Hyperglycemia, likely undiagnosed Diabetes  HgbA1c 6.6, goal < 7.0  SSI  CBG monitoring  Dysphagia . Secondary to stroke . NPO . On IVF  Other Stroke Risk Factors  Cigarette smoker  Hx polysubstance abuse. Attends the Methadone clinic. 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  Coronary artery disease s/p NSTEMI, PCI  Other Active Problems  Acute on chronic kidney disease GFR 24, Cre 2.51 -> 4.04  Hypokalemia, resolved 3.0 -> 3.8  Leukocytosis 14. UA small LE, 6-10 WBC. Ucx pending BCx pending   Hospital day # 2  This patient is critically ill due to extensive pontine ICH with hydrocephalus, hypertensive emergency, unresponsive and at significant risk of neurological worsening, death form brain herniation, cerebral edema, brain death, seizure, obstructive hydrocephalus. This patient's care requires constant monitoring of vital signs,  hemodynamics, respiratory and cardiac monitoring, review of multiple databases, neurological assessment, discussion with family, other specialists and medical decision making of high complexity. I spent 30 minutes of neurocritical care time in the care of this patient. I discussed with Dr. Lynetta Mare.   Rosalin Hawking, MD PhD Stroke Neurology 08-Nov-2018 10:27 AM   To contact Stroke Continuity provider, please refer to [email or web address] After hours, contact General Neurology

## 2018-11-09 NOTE — Progress Notes (Signed)
Provided report to [facility] upon arrival to patient room for transport to [facility] for organ procurement.  Hiram Gash RN

## 2018-11-09 NOTE — Death Summary Note (Signed)
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 Asthma   . CAD (coronary artery disease)    NSTEMI with cath in August 2012 with nonobstructive disease, 50% ostial D1 and 70% distal LCX and PL disease. Normal EF. Has diastolic dysfunction with elevated EDP  . Cleft palate    special denture covers defect like a C, sleeps with device in place  . COPD (chronic obstructive pulmonary disease) (HCC)    o2 and bedtime   . DYSPNEA ON EXERTION 03/08/2007  . ELECTROCARDIOGRAM, ABNORMAL 02/13/2010  . History of oxygen administration    bedtime  . HTN (hypertension)    Has normal renal arteries.  . Hyperlipidemia   . NSTEMI (non-ST elevated myocardial infarction) ([place]) 09-25-2010  . Renal disorder   . Stroke [facility])    july 2016, Dr Donnella Sham  . Tobacco abuse disorder    Past Surgical History:  Procedure Laterality Date  . CARDIAC CATHETERIZATION  Aug 2012   Moderate nonobstructive multivessel CAD with an EF of 70 to 75%  . CESAREAN SECTION    . CORONARY ANGIOPLASTY     no further intervention last cardiac visit  greater than a year    Allergies as of 11/22/2018   No Known Allergies  LABORATORY STUDIES CBC    Component Value Date/Time   WBC 14.0 (H) 22-Nov-2018 0338   RBC 4.13 11-22-18 0338   HGB 10.9 (L) 11-22-18 1105   HGB 12.5 01/11/2018 1430   HCT 32.0 (L) 11-22-18 1105   HCT 38.3 01/11/2018 1430   PLT 215 11-22-18 0338   PLT 329 01/11/2018 1430   MCV 91.3 11-22-18 0338   MCV 86 01/11/2018 1430   MCH 29.1 11/22/18 0338   MCHC 31.8 22-Nov-2018 0338   RDW 15.8 (H) 22-Nov-2018 0338   RDW 14.1 01/11/2018 1430   LYMPHSABS 1.2 11/22/2018 0338   LYMPHSABS 2.1 01/11/2018 1430   MONOABS 0.9 22-Nov-2018 0338   EOSABS 0.0 2018/11/22 0338   EOSABS 0.8 (H) 01/11/2018 1430   BASOSABS 0.0 22-Nov-2018 0338   BASOSABS 0.1 01/11/2018 1430   CMP    Component Value Date/Time   NA 163 (HH) 11/22/18 1105   NA 143 01/11/2018 1430   K 4.0 2018/11/22 1105   CL 127 (H) 22-Nov-2018 1046   CO2 27 11-22-18 1046   GLUCOSE 122 (H) 11/22/2018 1046   BUN 61 (H) 2018-11-22 1046   BUN 38 (H) 01/11/2018 1430   CREATININE 4.21 (H) 11/22/2018 1046   CALCIUM 7.6 (L) 11-22-18 1046   PROT 5.3 (L) 22-Nov-2018 0338   PROT 6.3 01/11/2018 1430  ALBUMIN 2.2 (L) 11-24-2018 0338   ALBUMIN 4.3 01/11/2018 1430   AST 47 (H) 11/24/2018 0338   ALT 18 11/24/18 0338   ALKPHOS 65 2018/11/24 0338   BILITOT 0.5 11-24-2018 0338   BILITOT <0.2 01/11/2018 1430   GFRNONAA 11 (L) 24-Nov-2018 1046   GFRAA 13 (L) 11-24-2018 1046   COAGS Lab Results  Component Value Date   INR 1.3 (H) November 24, 2018   INR 1.2 11/01/2018   INR 1.0 10/30/2018   Lipid Panel    Component Value Date/Time   CHOL 176 11/06/2018 0910   CHOL 151 04/22/2017 1457   TRIG 138 10/25/2018 0910   HDL 48 10/25/2018 0910   HDL 43 04/22/2017 1457   CHOLHDL 3.7 10/24/2018 0910   VLDL 28 10/18/2018 0910   LDLCALC 100 (H) 11/01/2018 0910   LDLCALC 84 04/22/2017 1457   HgbA1C  Lab Results  Component Value Date   HGBA1C 6.6 (H) 10/19/2018    Urinalysis    Component Value Date/Time   COLORURINE YELLOW 11/24/18 0338   APPEARANCEUR CLOUDY (A) 2018/11/24 0338   LABSPEC 1.011 11-24-2018 0338   PHURINE 5.0 11-24-18 0338   GLUCOSEU NEGATIVE 2018-11-24 0338   HGBUR SMALL (A) 11-24-2018 0338   BILIRUBINUR NEGATIVE 11-24-2018 0338   KETONESUR NEGATIVE 11-24-18 0338   PROTEINUR NEGATIVE 11-24-18 0338   UROBILINOGEN 0.2 03/29/2018 1107   NITRITE NEGATIVE 11-24-18 0338   LEUKOCYTESUR SMALL (A) November 24, 2018 0338   Urine Drug Screen     Component Value Date/Time   LABOPIA NONE DETECTED 11/01/2018 1600   COCAINSCRNUR NONE DETECTED 11/01/2018 1600   LABBENZ NONE DETECTED 11/01/2018 1600   AMPHETMU NONE DETECTED 11/01/2018 1600   THCU NONE DETECTED 11/01/2018 1600   LABBARB NONE DETECTED 11/01/2018 1600    Alcohol Level    Component Value Date/Time   ETH <10 10/22/2018 0910     SIGNIFICANT DIAGNOSTIC STUDIES Ct Abdomen Pelvis Wo Contrast  Result Date: 11/24/18 CLINICAL DATA:  53 year old female with respiratory failure. EXAM: CT CHEST, ABDOMEN AND PELVIS WITHOUT CONTRAST TECHNIQUE: Multidetector CT imaging of the chest, abdomen and pelvis was performed following the standard protocol without IV contrast. COMPARISON:  Chest radiograph dated 10/16/2018. FINDINGS: Evaluation of this exam is limited in the absence of intravenous contrast. CT CHEST FINDINGS Cardiovascular: Top-normal cardiac size. There is a pericardial effusion measuring up to 14 mm posterior to the heart. Coronary vascular calcifications noted. There is mild atherosclerotic calcification of the thoracic aorta. The thoracic aorta and central pulmonary arteries are otherwise grossly unremarkable on this noncontrast CT. Mediastinum/Nodes: No hilar or mediastinal adenopathy. An enteric tube is noted in the esophagus. No mediastinal fluid collection. Lungs/Pleura: Trace trace bilateral pleural effusions. Bibasilar linear and streaky densities may represent  atelectasis or pneumonia. Clinical correlation is recommended. There is a cluster of nodular density in the superior segment of the left lower lobe with tree-in-bud appearance most consistent with an inflammatory/infectious etiology. There is no pneumothorax. An endotracheal tube is noted with tip approximately 17 mm above the carina. The central airways remain patent. Musculoskeletal: No chest wall mass or suspicious bone lesions identified. CT ABDOMEN PELVIS FINDINGS No intra-abdominal free air. Small free fluid within the pelvis. Hepatobiliary: The liver is grossly unremarkable. Probable small sludge in the gallbladder. No pericholecystic fluid. Pancreas: Unremarkable. No pancreatic ductal dilatation or surrounding inflammatory changes. Spleen: Normal in size without focal abnormality. Adrenals/Urinary Tract: The adrenal glands are unremarkable. The kidneys, visualized ureters, and urinary bladder appear unremarkable. Stomach/Bowel: An enteric tube with  tip in the region of the duodenal bulb. There is no bowel obstruction or active inflammation. Dense stool noted throughout the colon and in the rectum. The appendix is normal. Vascular/Lymphatic: Mild aortoiliac atherosclerotic disease. The IVC is grossly unremarkable. No portal venous gas. There is no adenopathy. Reproductive: The uterus and ovaries are grossly unremarkable. No pelvic mass. Other: Midline vertical anterior abdominal wall incisional scar. Musculoskeletal: Degenerative changes most prominent at L5-S1 with disc desiccation and vacuum phenomena. No acute osseous pathology. IMPRESSION: 1. Small bilateral pleural effusions with bibasilar atelectasis or pneumonia. A cluster of nodular density in the superior segment of the left lower lobe with tree-in-bud appearance is most consistent with an inflammatory/infectious process. Clinical correlation is recommended. 2. Small pericardial effusion. 3. No acute intra-abdominal or pelvic pathology. 4.  Constipation. No bowel obstruction or active inflammation. Normal appendix. Aortic Atherosclerosis (ICD10-I70.0). Electronically Signed   By: Anner Crete M.D.   On: 11-04-18 00:08   Ct Head Wo Contrast  Result Date: 11/01/2018 CLINICAL DATA:  Follow-up examination for intracerebral hemorrhage. EXAM: CT HEAD WITHOUT CONTRAST TECHNIQUE: Contiguous axial images were obtained from the base of the skull through the vertex without intravenous contrast. COMPARISON:  Prior CT from 10/25/2018. FINDINGS: Brain: There has been continued interval progression of previously identified intraparenchymal hemorrhage centered at the pons, again seen extending cephalad into the midbrain and along the right cortical spinal tract into the right thalamus and corona radiata. Localized edema and regional mass effect has increased. Hemorrhage seen extending into the fourth ventricle, with extension into the cerebral aqueduct and third ventricle, with small volume hemorrhage now seen layering within the occipital horns of both lateral ventricles. Increased ventricular dilatation including temporal horn dilatation compatible with obstructive hydrocephalus. Mild localized right-to-left shift at the level of the third ventricle/cerebral aqueduct. Suggestion of loss of cortical sulcation elsewhere within the brain, suggesting continued developing edema. Cerebellar tonsils remain above the foramen magnum. Underlying cerebral white matter disease again noted. No extra-axial fluid collection. Vascular: No asymmetric hyperdense vessel. Skull: Scalp soft tissues and calvarium within normal limits. Sinuses/Orbits: Globes and orbital soft tissues demonstrate no acute finding. Absence of the nasal septum again noted. Chronic mucoperiosteal thickening within the ethmoidal air cells and maxillary sinuses again noted. Mastoid air cells remain clear. Other: None. IMPRESSION: 1. Continued interval progression of intraparenchymal hemorrhage centered  at the pons with extension into the right thalamus and corona radiata. Localized edema and regional mass effect has worsened, with localized right-to-left midline shift at the level of the thalamus. 2. Intraventricular extension with blood within the fourth, third, and lateral ventricles. Increased ventricular dilatation compatible with associated obstructive hydrocephalus, also worsened from previous. 3. Progressive loss of cortical sulcation elsewhere within the brain, consistent with continued developing cerebral edema. No frank transtentorial herniation at this time. 4. No other new acute intracranial abnormality. Critical Value/emergent results were called by telephone at the time of interpretation on 11/01/2018 at 2:16 am to providerDr. Rory Percy, who verbally acknowledged these results. Electronically Signed   By: Jeannine Boga M.D.   On: 11/01/2018 02:18   Ct Head Wo Contrast  Result Date: 10/20/2018 CLINICAL DATA:  Worsening neuro status. EXAM: CT HEAD WITHOUT CONTRAST CT CERVICAL SPINE WITHOUT CONTRAST TECHNIQUE: Multidetector CT imaging of the head and cervical spine was performed following the standard protocol without intravenous contrast. Multiplanar CT image reconstructions of the cervical spine were also generated. COMPARISON:  None. FINDINGS: CT HEAD FINDINGS Brain: There is progression of the previously noted pontine hemorrhage, now  extending superiorly along the cortical spinal tracts to the level of the corona radiata. Progressive hemorrhage extends into the fourth ventricle and superiorly to the third ventricle. Lateral ventricles have increased in size. There is some layering blood within the posterior horn of the right lateral ventricle. Diffuse white matter disease is again seen. There is further effacement of the sulci consistent with diffuse mass effect. Cerebellar tonsils remain above the foramen magnum. Vascular: Atherosclerotic calcifications are present within the cavernous  internal carotid arteries. There is no hyperdense vessel. Skull: Calvarium is intact. No focal lytic or blastic lesions are present. Sinuses/Orbits: Nasal septum is absent. Mild mucosal thickening is present within the maxillary sinuses and scattered through the remaining ethmoid air cells. The paranasal sinuses and mastoid air cells are otherwise clear. The globes and orbits are within normal limits. CT CERVICAL SPINE FINDINGS Alignment: There is straightening of the normal cervical lordosis. Slight anterolisthesis present at C3-4. No other significant listhesis is present. Skull base and vertebrae: Craniocervical junction is normal. Vertebral body heights are maintained. No acute or healing fractures are present. Soft tissues and spinal canal: Extra-axial hemorrhage is noted at the foramen magnum. This does not extend inferiorly within the canal. Soft tissues the neck demonstrate atherosclerotic calcifications on the left. OG tube and endotracheal tube are in place. Disc levels: Asymmetric facet degenerative changes are noted on the right at C3-4. Uncovertebral spurring contributes to left foraminal narrowing at C5-6 and mild right foraminal narrowing at C6-7. Upper chest: The lung apices are clear. IMPRESSION: 1. Progression of intracranial hemorrhage extending now cephalad along the right cortical spinal tracts to the level of the corona radiata. 2. Increasing intracranial mass effect. Early midline shift is present at the level of the thalami. There is further effacement of the sulci. 3. Dilating lateral ventricles consistent with developing hydrocephalus. 4. Degenerative changes in the cervical spine without acute abnormality. Critical Value/emergent results were called by telephone at the time of interpretation on 11/04/2018 at 10:43 am to [facility], who verbally acknowledged these results. Electronically Signed   By: San Morelle M.D.   On: 10/23/2018 10:50   Ct Chest Wo  Contrast  Result Date: 11-25-2018 CLINICAL DATA:  53 year old female with respiratory failure. EXAM: CT CHEST, ABDOMEN AND PELVIS WITHOUT CONTRAST TECHNIQUE: Multidetector CT imaging of the chest, abdomen and pelvis was performed following the standard protocol without IV contrast. COMPARISON:  Chest radiograph dated 10/13/2018. FINDINGS: Evaluation of this exam is limited in the absence of intravenous contrast. CT CHEST FINDINGS Cardiovascular: Top-normal cardiac size. There is a pericardial effusion measuring up to 14 mm posterior to the heart. Coronary vascular calcifications noted. There is mild atherosclerotic calcification of the thoracic aorta. The thoracic aorta and central pulmonary arteries are otherwise grossly unremarkable on this noncontrast CT. Mediastinum/Nodes: No hilar or mediastinal adenopathy. An enteric tube is noted in the esophagus. No mediastinal fluid collection. Lungs/Pleura: Trace trace bilateral pleural effusions. Bibasilar linear and streaky densities may represent atelectasis or pneumonia. Clinical correlation is recommended. There is a cluster of nodular density in the superior segment of the left lower lobe with tree-in-bud appearance most consistent with an inflammatory/infectious etiology. There is no pneumothorax. An endotracheal tube is noted with tip approximately 17 mm above the carina. The central airways remain patent. Musculoskeletal: No chest wall mass or suspicious bone lesions identified. CT ABDOMEN PELVIS FINDINGS No intra-abdominal free air. Small free fluid within the pelvis. Hepatobiliary: The liver is grossly unremarkable. Probable small sludge in the gallbladder. No pericholecystic  fluid. Pancreas: Unremarkable. No pancreatic ductal dilatation or surrounding inflammatory changes. Spleen: Normal in size without focal abnormality. Adrenals/Urinary Tract: The adrenal glands are unremarkable. The kidneys, visualized ureters, and urinary bladder appear unremarkable.  Stomach/Bowel: An enteric tube with tip in the region of the duodenal bulb. There is no bowel obstruction or active inflammation. Dense stool noted throughout the colon and in the rectum. The appendix is normal. Vascular/Lymphatic: Mild aortoiliac atherosclerotic disease. The IVC is grossly unremarkable. No portal venous gas. There is no adenopathy. Reproductive: The uterus and ovaries are grossly unremarkable. No pelvic mass. Other: Midline vertical anterior abdominal wall incisional scar. Musculoskeletal: Degenerative changes most prominent at L5-S1 with disc desiccation and vacuum phenomena. No acute osseous pathology. IMPRESSION: 1. Small bilateral pleural effusions with bibasilar atelectasis or pneumonia. A cluster of nodular density in the superior segment of the left lower lobe with tree-in-bud appearance is most consistent with an inflammatory/infectious process. Clinical correlation is recommended. 2. Small pericardial effusion. 3. No acute intra-abdominal or pelvic pathology. 4. Constipation. No bowel obstruction or active inflammation. Normal appendix. Aortic Atherosclerosis (ICD10-I70.0). Electronically Signed   By: Anner Crete M.D.   On: November 10, 2018 00:08   Ct Cervical Spine Wo Contrast  Result Date: 10/17/2018 CLINICAL DATA:  Worsening neuro status. EXAM: CT HEAD WITHOUT CONTRAST CT CERVICAL SPINE WITHOUT CONTRAST TECHNIQUE: Multidetector CT imaging of the head and cervical spine was performed following the standard protocol without intravenous contrast. Multiplanar CT image reconstructions of the cervical spine were also generated. COMPARISON:  None. FINDINGS: CT HEAD FINDINGS Brain: There is progression of the previously noted pontine hemorrhage, now extending superiorly along the cortical spinal tracts to the level of the corona radiata. Progressive hemorrhage extends into the fourth ventricle and superiorly to the third ventricle. Lateral ventricles have increased in size. There is some  layering blood within the posterior horn of the right lateral ventricle. Diffuse white matter disease is again seen. There is further effacement of the sulci consistent with diffuse mass effect. Cerebellar tonsils remain above the foramen magnum. Vascular: Atherosclerotic calcifications are present within the cavernous internal carotid arteries. There is no hyperdense vessel. Skull: Calvarium is intact. No focal lytic or blastic lesions are present. Sinuses/Orbits: Nasal septum is absent. Mild mucosal thickening is present within the maxillary sinuses and scattered through the remaining ethmoid air cells. The paranasal sinuses and mastoid air cells are otherwise clear. The globes and orbits are within normal limits. CT CERVICAL SPINE FINDINGS Alignment: There is straightening of the normal cervical lordosis. Slight anterolisthesis present at C3-4. No other significant listhesis is present. Skull base and vertebrae: Craniocervical junction is normal. Vertebral body heights are maintained. No acute or healing fractures are present. Soft tissues and spinal canal: Extra-axial hemorrhage is noted at the foramen magnum. This does not extend inferiorly within the canal. Soft tissues the neck demonstrate atherosclerotic calcifications on the left. OG tube and endotracheal tube are in place. Disc levels: Asymmetric facet degenerative changes are noted on the right at C3-4. Uncovertebral spurring contributes to left foraminal narrowing at C5-6 and mild right foraminal narrowing at C6-7. Upper chest: The lung apices are clear. IMPRESSION: 1. Progression of intracranial hemorrhage extending now cephalad along the right cortical spinal tracts to the level of the corona radiata. 2. Increasing intracranial mass effect. Early midline shift is present at the level of the thalami. There is further effacement of the sulci. 3. Dilating lateral ventricles consistent with developing hydrocephalus. 4. Degenerative changes in the cervical  spine without acute  abnormality. Critical Value/emergent results were called by telephone at the time of interpretation on 11/05/2018 at 10:43 am to [facility], who verbally acknowledged these results. Electronically Signed   By: San Morelle M.D.   On: 11/03/2018 10:50   Nm Brain W Vasc Flow Min 4v  Result Date: November 11, 2018 CLINICAL DATA:  Catastrophic pontine hemorrhage. Clinical brain death by neurologic exam. EXAM: NM BRAIN SCAN WITH FLOW - 4+ VIEW TECHNIQUE: Radionuclide angiogram and static images of the brain were obtained after intravenous injection of radiopharmaceutical. RADIOPHARMACEUTICALS:  [id number] millicuries technetium 99 Ceretec COMPARISON:  CT head November 01, 2018 FINDINGS: Angiographic phase demonstrates adequate bolus of radiotracer within the subclavian vein and extra cranial carotid arteries. Delayed imaging demonstrates no evidence cerebral perfusion within LEFT or RIGHT cerebral hemispheres. IMPRESSION: Absent vascular flow to the cerebral hemispheres. No intracranial perfusion. Findings conveyed toRAVI AGARWALA on 11-11-18  at15:25. Electronically Signed   By: Suzy Bouchard M.D.   On: 11-11-2018 15:30   Dg Chest Portable 1 View  Result Date: 10/12/2018 CLINICAL DATA:  Respiratory failure EXAM: PORTABLE CHEST 1 VIEW COMPARISON:  01/22/2018 FINDINGS: Endotracheal tube terminates approximately 2.5 cm superior to the carina. Enteric tube courses below the diaphragm with distal tip and side hole beyond the inferior margin of the film. The heart size and mediastinal contours are within normal limits. Both lungs are clear. The visualized skeletal structures are unremarkable. IMPRESSION: Satisfactory positioning of endotracheal and enteric tubes. Lungs are clear. Electronically Signed   By: Davina Poke M.D.   On: 10/25/2018 09:55   Ct Head Code Stroke Wo Contrast  Result Date: 10/22/2018 CLINICAL DATA:  Code stroke. Altered mental status. Last seen normal 0500  hours EXAM: CT HEAD WITHOUT CONTRAST TECHNIQUE: Contiguous axial images were obtained from the base of the skull through the vertex without intravenous contrast. COMPARISON:  MRI 04/03/2015.  Head CT 02/15/2015. FINDINGS: Brain: Acute pontine hemorrhage with extension into the right perimesencephalic cistern and fourth ventricle. Cerebral hemispheres show chronic small-vessel ischemic change of the white matter and old infarction in the left basal ganglia and external capsule region. There is also old infarction in the left midbrain and cerebral peduncle. Question newly seen low-density in the left occipital lobe which could represent infarction or edema. Some potential that the low-density in the left lateral basal ganglia and external capsule could be recent. Vascular: No abnormal vessel finding. Skull: Negative Sinuses/Orbits: Clear/normal Other: None ASPECTS ([place] Stroke Program Early CT Score) Not applicable in this setting. IMPRESSION: 1. Acute pontine hemorrhage with extension into the perimesencephalic cisterns on the right and into the fourth ventricle. 2. Old small vessel infarctions. Possible recent infarction in the right occipital lobe and left lateral basal ganglia/external capsule. 3. These results were communicated to Dr. Cheral Marker at 9:08 amon 09/12/2020by text page via the [facility] messaging system. Electronically Signed   By: Nelson Chimes M.D.   On: 10/31/2018 09:12    2D Echocardiogram  1. Left ventricular ejection fraction, by visual estimation, is 70 to 75%. The left ventricle has hyperdynamic function. Normal left ventricular size. There is moderately increased left ventricular hypertrophy.  2. Mid cavitary velocity 3.76m/s, gradient 24mmHg.  3. Global right ventricle has normal systolic function.The right ventricular size is normal. No increase in right ventricular wall thickness.  4. Left atrial size was normal.  5. Right atrial size was normal.  6. Small pericardial effusion.  7. The  pericardial effusion is circumferential.  8. The mitral valve is normal in structure. Trace  mitral valve regurgitation. No evidence of mitral stenosis.  9. The tricuspid valve is normal in structure. Tricuspid valve regurgitation is trivial. 10. The aortic valve is normal in structure. Aortic valve regurgitation was not visualized by color flow Doppler. Structurally normal aortic valve, with no evidence of sclerosis or stenosis. 11. The pulmonic valve was normal in structure. Pulmonic valve regurgitation is not visualized by color flow Doppler. 12. Normal pulmonary artery systolic pressure. 13. The inferior vena cava is normal in size with <50% respiratory variability, suggesting right atrial pressure of 8 mmHg. 14. No embolic source identified.     HISTORY OF PRESENT ILLNESS Margaretann Levering is an 53 y.o. female who presents from home with acute onset of decreased responsiveness, progressing to unresponsiveness en route. She was LKN at [employer] on 11/06/2018 when she went to the methadone clinic. She drove herself home and family heard her scream for help, then found her on the floor with decreased responsiveness. 911 was called. When EMS arrived she was still responsive, but upon arrival to ED she was only responsive to pain in the legs. In CT, she began exhibiting sonorous respirations. CT head showed a pontine hemorrhage with extension into the 4th ventricle and basal cisterns. She was emergently intubated for worsening respirations with inability to protect airway. CTA head and neck was ordered to evaluate for possible aneurysm.    HOSPITAL COURSE Ms. Brande Raker is a 53 y.o. female with history of HTN, HLD, CAD, COPD, prior stroke, tobacco abuse who attends the Methadone clinic who presented with decreased responsiveness, progressing to unresponsiveness en route. Imaging confirmed pontine hemorrhage. Intubated in ED. Quickly progressed to brain death, confirmed by nuclear blood flow. She is an  organ donor and plans in place for organ procurement.   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 No role. Fatal hemorrhage.    Code Stroke CT head 9/22 0901 acute pontine hemorrhage w/ IVH and blood in 4th ventricle. Old Small vessel disease. Possible subacute R occipital and L lateral basal ganglia/external capsule infarct.    Repeat CT head 9/22 1021 progression ICH into R cortical spinal tracts w/ edema and mass effect. Early thalamic midline shift. Effacement of sulci. Dilating lateral ventricles c/w hydrocephalus.  CT C Spine degenerative, no fx  CT head 9/23 0143 continued interval progression pontine ICH w/ extention R thalamus and corona radiata. Worsened edema and mass effect, midline shift at thalamus. IVH in 3rd, 4th and lateral ventricles w/ worsened obstructive hydrocephalus. Progressive loss of cortical sulcation throughout.    2D Echo EF 70-75%  LDL 100  HgbA1c 6.6  UDS neg  HIV neg  Pregnancy neg   aspirin 325 mg daily prior to admission  Code status - DNR   Cerebral edema, obstructive hydrocephalus  R pupil blown, projectile vomiting 9/22 overnight  ICH extension confirmed on CT  Treated with 3% protocol  Na 145->153 -> 161 -> 161  Acute Respiratory Failure  Secondary to [place]  Intubated in ED  CCM on board  Hypotension, Tachycardia  Secondary to herniation syndrome  On phenylephrine gtt to maintain BP  Hypertensive Emergency  BP as high as 270/170 on arrival  Home meds: amlodipine,  labetalol and hydralazine   SBP goal in hospital < 140  Treated in ED w/ cleviprex drip, now off  prn  labetalol and hydralazine  Hyperlipidemia  Home meds:  lipitor 20  LDL 100  Statin on hold given hemorrhage  Hyperglycemia, likely undiagnosed Diabetes  HgbA1c 6.6, at goal < 7.0  Dysphagia  Secondary to stroke  NPO  On IVF  Other Stroke Risk Factors  Cigarette smoker  Hx polysubstance abuse. Attends the Methadone clinic. UDS neg  Overweight, Body mass index is 27.5 kg/m  Hx stroke/TIA ? 08/2014 L brain subcortical infarct d/t small vessel disease.    Coronary artery disease s/p NSTEMI, PCI  Other Active Problems  Acute on chronic kidney disease GFR 24, Cre 2.51 -> 4.04  Hypokalemia, resolved 3.0 -> 3.8  Leukocytosis 14. UA small LE, 6-10 WBC. Ucx pending BCx pending   DISCHARGE EXAM Blood pressure 126/88, pulse (!) 101, temperature (!) 97.3 F (36.3 C), temperature source Axillary, resp. rate 20, height 5\' 2"  (1.575 m), weight 68.2 kg, last menstrual period 09/09/2014, SpO2 100 %. General - Well nourished, well developed, intubated without sedation.  Ophthalmologic - fundi not visualized due to noncooperation.  Cardiovascular - Regular rate and rhythm.  Neuro - intubated without sedation, eyes closed, not following commands. With forced eye opening, eyes in mid position, not blinking to visual threat, doll's eyes absent, not tracking, pupils 37mm fixed without pupillary reflexes. Corneal reflex absent, gag and cough absent. Not breathing over the vent.  Facial symmetry not able to test due to ET tube. On pain stimulation, no movement of all extremities. DTR diminished and no babinski. Sensation, coordination and gait not tested.  DISCHARGE PLAN  Death 11/10/18 at 1530  Disposition:  Transferred to [facility] for organ harvesting  30 minutes were spent preparing discharge.  Burnetta Sabin, MSN, APRN, ANVP-BC, AGPCNP-BC Advanced Practice Stroke Nurse [place] for Schedule & Pager information Nov 10, 2018 5:24 PM

## 2018-11-09 NOTE — Progress Notes (Signed)
Nutrition Brief Note  Chart reviewed. Pt now transitioning to comfort care.  No further nutrition interventions warranted at this time.  Please re-consult as needed.   Alleya Demeter RD, LDN Clinical Nutrition Pager # - [phone]    

## 2018-11-09 NOTE — Progress Notes (Signed)
Patient transported to CT and back to 0000000 without complications.

## 2018-11-09 NOTE — Progress Notes (Signed)
Patient was transported for brain flow study & returned back to 4N31 without any complications.

## 2018-11-09 DEATH — deceased

## 2023-09-22 NOTE — Progress Notes (Signed)
 This encounter was created in error - please disregard.
